# Patient Record
Sex: Female | Born: 1970 | Hispanic: No | Smoking: Former smoker
Health system: Southern US, Community
[De-identification: ages and names within clinical notes are randomized; demographics above are authoritative.]

## PROBLEM LIST (undated history)

## (undated) DIAGNOSIS — D219 Benign neoplasm of connective and other soft tissue, unspecified: Secondary | ICD-10-CM

## (undated) DIAGNOSIS — E669 Obesity, unspecified: Secondary | ICD-10-CM

## (undated) DIAGNOSIS — E119 Type 2 diabetes mellitus without complications: Secondary | ICD-10-CM

## (undated) DIAGNOSIS — E785 Hyperlipidemia, unspecified: Secondary | ICD-10-CM

## (undated) DIAGNOSIS — R12 Heartburn: Secondary | ICD-10-CM

## (undated) DIAGNOSIS — I10 Essential (primary) hypertension: Secondary | ICD-10-CM

## (undated) DIAGNOSIS — E559 Vitamin D deficiency, unspecified: Secondary | ICD-10-CM

## (undated) DIAGNOSIS — M1990 Unspecified osteoarthritis, unspecified site: Secondary | ICD-10-CM

## (undated) DIAGNOSIS — E079 Disorder of thyroid, unspecified: Secondary | ICD-10-CM

## (undated) DIAGNOSIS — G4730 Sleep apnea, unspecified: Secondary | ICD-10-CM

## (undated) DIAGNOSIS — Z87898 Personal history of other specified conditions: Secondary | ICD-10-CM

## (undated) DIAGNOSIS — K219 Gastro-esophageal reflux disease without esophagitis: Secondary | ICD-10-CM

## (undated) DIAGNOSIS — F419 Anxiety disorder, unspecified: Secondary | ICD-10-CM

## (undated) HISTORY — PX: WISDOM TOOTH EXTRACTION: SHX21

## (undated) HISTORY — DX: Disorder of thyroid, unspecified: E07.9

## (undated) HISTORY — DX: Essential (primary) hypertension: I10

## (undated) HISTORY — DX: Heartburn: R12

## (undated) HISTORY — PX: TONSILLECTOMY: SUR1361

## (undated) HISTORY — DX: Gastro-esophageal reflux disease without esophagitis: K21.9

## (undated) HISTORY — DX: Anxiety disorder, unspecified: F41.9

## (undated) HISTORY — DX: Hyperlipidemia, unspecified: E78.5

## (undated) HISTORY — DX: Unspecified osteoarthritis, unspecified site: M19.90

## (undated) HISTORY — DX: Personal history of other specified conditions: Z87.898

## (undated) HISTORY — DX: Vitamin D deficiency, unspecified: E55.9

## (undated) HISTORY — DX: Obesity, unspecified: E66.9

## (undated) HISTORY — DX: Sleep apnea, unspecified: G47.30

## (undated) HISTORY — DX: Type 2 diabetes mellitus without complications: E11.9

---

## 2000-01-29 ENCOUNTER — Emergency Department (HOSPITAL_COMMUNITY): Admission: EM | Admit: 2000-01-29 | Discharge: 2000-01-29 | Payer: Self-pay | Admitting: Emergency Medicine

## 2000-05-15 ENCOUNTER — Other Ambulatory Visit: Admission: RE | Admit: 2000-05-15 | Discharge: 2000-05-15 | Payer: Self-pay | Admitting: Obstetrics and Gynecology

## 2000-06-19 ENCOUNTER — Encounter: Admission: RE | Admit: 2000-06-19 | Discharge: 2000-09-17 | Payer: Self-pay | Admitting: Gynecology

## 2001-05-29 ENCOUNTER — Encounter: Payer: Self-pay | Admitting: Neurosurgery

## 2001-05-29 ENCOUNTER — Ambulatory Visit (HOSPITAL_COMMUNITY): Admission: RE | Admit: 2001-05-29 | Discharge: 2001-05-29 | Payer: Self-pay | Admitting: Neurosurgery

## 2001-06-26 ENCOUNTER — Emergency Department (HOSPITAL_COMMUNITY): Admission: EM | Admit: 2001-06-26 | Discharge: 2001-06-26 | Payer: Self-pay | Admitting: Emergency Medicine

## 2001-07-02 ENCOUNTER — Other Ambulatory Visit: Admission: RE | Admit: 2001-07-02 | Discharge: 2001-07-02 | Payer: Self-pay | Admitting: Obstetrics and Gynecology

## 2002-05-26 ENCOUNTER — Emergency Department (HOSPITAL_COMMUNITY): Admission: EM | Admit: 2002-05-26 | Discharge: 2002-05-26 | Payer: Self-pay | Admitting: Emergency Medicine

## 2004-03-20 ENCOUNTER — Other Ambulatory Visit: Admission: RE | Admit: 2004-03-20 | Discharge: 2004-03-20 | Payer: Self-pay | Admitting: Obstetrics and Gynecology

## 2004-12-31 HISTORY — PX: CHOLECYSTECTOMY: SHX55

## 2005-10-11 ENCOUNTER — Encounter: Admission: RE | Admit: 2005-10-11 | Discharge: 2005-10-11 | Payer: Self-pay | Admitting: Gastroenterology

## 2006-01-07 ENCOUNTER — Encounter (INDEPENDENT_AMBULATORY_CARE_PROVIDER_SITE_OTHER): Payer: Self-pay | Admitting: *Deleted

## 2006-01-07 ENCOUNTER — Ambulatory Visit (HOSPITAL_COMMUNITY): Admission: RE | Admit: 2006-01-07 | Discharge: 2006-01-08 | Payer: Self-pay | Admitting: General Surgery

## 2007-02-05 ENCOUNTER — Emergency Department (HOSPITAL_COMMUNITY): Admission: EM | Admit: 2007-02-05 | Discharge: 2007-02-05 | Payer: Self-pay | Admitting: Emergency Medicine

## 2009-03-08 ENCOUNTER — Encounter: Admission: RE | Admit: 2009-03-08 | Discharge: 2009-03-08 | Payer: Self-pay | Admitting: Obstetrics and Gynecology

## 2009-03-11 ENCOUNTER — Encounter: Admission: RE | Admit: 2009-03-11 | Discharge: 2009-03-11 | Payer: Self-pay | Admitting: Obstetrics and Gynecology

## 2009-04-19 ENCOUNTER — Ambulatory Visit (HOSPITAL_COMMUNITY): Admission: RE | Admit: 2009-04-19 | Discharge: 2009-04-20 | Payer: Self-pay | Admitting: Interventional Radiology

## 2009-05-10 ENCOUNTER — Encounter: Admission: RE | Admit: 2009-05-10 | Discharge: 2009-05-10 | Payer: Self-pay | Admitting: Interventional Radiology

## 2009-12-06 ENCOUNTER — Encounter: Admission: RE | Admit: 2009-12-06 | Discharge: 2009-12-06 | Payer: Self-pay | Admitting: Interventional Radiology

## 2009-12-31 HISTORY — PX: ROBOT ASSISTED MYOMECTOMY: SHX5142

## 2010-02-07 ENCOUNTER — Emergency Department (HOSPITAL_COMMUNITY): Admission: EM | Admit: 2010-02-07 | Discharge: 2010-02-07 | Payer: Self-pay | Admitting: Emergency Medicine

## 2011-01-21 ENCOUNTER — Encounter: Payer: Self-pay | Admitting: Obstetrics and Gynecology

## 2011-03-23 LAB — DIFFERENTIAL
Lymphocytes Relative: 9 % — ABNORMAL LOW (ref 12–46)
Lymphs Abs: 0.8 10*3/uL (ref 0.7–4.0)
Monocytes Absolute: 1 10*3/uL (ref 0.1–1.0)
Monocytes Relative: 11 % (ref 3–12)

## 2011-03-23 LAB — POCT CARDIAC MARKERS
CKMB, poc: <1 ng/mL — ABNORMAL LOW (ref 1.0–8.0)
Myoglobin, poc: 61.2 ng/mL (ref 12–200)
Myoglobin, poc: 63.8 ng/mL (ref 12–200)
Troponin i, poc: <0.05 ng/mL (ref 0.00–0.09)

## 2011-03-23 LAB — CBC
HCT: 37.1 % (ref 36.0–46.0)
MCHC: 34.5 g/dL (ref 30.0–36.0)
MCV: 92.1 fL (ref 78.0–100.0)

## 2011-04-11 LAB — HCG, SERUM, QUALITATIVE: Preg, Serum: NEGATIVE

## 2011-04-11 LAB — CREATININE, SERUM
Creatinine, Ser: 0.81 mg/dL (ref 0.4–1.2)
GFR calc Af Amer: >60 mL/min (ref >60)
GFR calc non Af Amer: >60 mL/min (ref >60)

## 2011-04-11 LAB — CBC: RDW: 12.1 % (ref 11.5–15.5)

## 2011-05-18 NOTE — Op Note (Signed)
Amanda Mcintosh, Amanda Mcintosh           ACCOUNT NO.:  [id number]   MEDICAL RECORD NO.:  [id number]          PATIENT TYPE:  AMB   LOCATION:  SDS                          FACILITY:  [facility]   PHYSICIAN:  Cherylynn Ridges, M.D.    DATE OF BIRTH:  10-05-1971   DATE OF PROCEDURE:  01/07/2006  DATE OF DISCHARGE:                                 OPERATIVE REPORT   PREOP DIAGNOSIS:  Symptomatic cholelithiasis.   POSTOP DIAGNOSIS:  Symptomatic cholelithiasis.   PROCEDURE:  Laparoscopic cholecystectomy.   SURGEON:  Dr. Lindie Spruce.   ASSISTANT:  Gita Kudo, M.D.   ANESTHESIA:  General endotracheal.   ESTIMATED BLOOD LOSS:  Less than 20 mL.   COMPLICATIONS:  None.   CONDITION:  Stable.   INDICATIONS FOR OPERATION:  The patient is a 40 year old female with  symptomatic gallstones who now comes in for elective laparoscopic  cholecystectomy.   FINDINGS:  The patient in addition to having some mild pericholecystic edema  had some adhesions of the liver capsule to the anterior abdominal wall  reminiscent of Fitz-Hugh-Curtis type disease. Cholangiogram was normal.   OPERATION:  The patient was taken to the operating room, placed on table in  supine position. After an adequate endotracheal anesthetic was administered,  she was prepped and draped in usual sterile manner exposing the midline in  the right upper quadrant.   A supraumbilical curvilinear incision was made and taken down to the midline  fascia. The fascia was grasped using Kocher clamps and subsequent incision  made between clamps using #15 blade. We then bluntly dissected down through  the fascial opening into the peritoneal cavity using a Kelly clamp. We then  placed a pursestring suture of 0 Vicryl in the opening in the fascia then  passed Hasson cannula into the peritoneal cavity with minimal difficulty.   We were able to demonstrate adequate visualization and placement of the  cannula. Once we did so, we placed two right costal  margin 5-mm cannulas and  a subxiphoid 11/12 mm cannula direct vision. Once all cannulas were in  place, the patient was placed in reverse Trendelenburg steeply.  The left  side was tilted down and the dissection begun.   The dome of the gallbladder was grasped and retract towards the right upper  quadrant and the anterior abdominal wall.  We placed a second one on the  infundibulum. We dissected out the peritoneum overlying the triangle of  Calot and hepatic duodenal triangle carefully dissected out a window around  the cystic duct and then subsequently the cystic artery. Proximal clamp was  placed on the gallbladder side of the cystic duct and the  cholecystodochotomy made using laparoscopic scissors. When this was done we  were able to pass a Cook catheter which had been passed through the thick  abdominal wall into cholecystodochotomy and secured in place with a  Endoclip. Cholangiogram was done which showed good flow into the duodenum,  ductal filling with no filling defects, good proximal flow and no evidence  of obstruction.   Once this was done, the gas was allowed to insufflate back into the  peritoneal cavity.  The clip securing the catheter in place was removed.  The cholangiogram catheter was removed and then the distal cystic duct  clipped doubly and then transected. Once the cystic duct was transected, we  dissected out the cystic artery, clipped it proximally x2 and distally x1  and then dissected out the gallbladder from its bed with minimal difficulty  and minimal bleeding.   The gallbladder was removed without EndoCatch bag. And then the fascial site  at the supraumbilical site closed using a pursestring suture. We then  injected 0.25%  Marcaine at all sites and then closed the skin using running  subcuticular stitch of 4-0 Vicryl. All needle counts, sponge counts and  instrument counts were correct. Sterile dressings were applied.      Cherylynn Ridges, M.D.   Electronically Signed     JOW/MEDQ  D:  01/07/2006  T:  01/07/2006  Job:  [id number]

## 2012-08-31 HISTORY — PX: OTHER SURGICAL HISTORY: SHX169

## 2012-09-08 ENCOUNTER — Observation Stay (HOSPITAL_BASED_OUTPATIENT_CLINIC_OR_DEPARTMENT_OTHER)
Admission: EM | Admit: 2012-09-08 | Discharge: 2012-09-09 | Disposition: A | Discharge status: Home / Self Care | Payer: BC Managed Care – PPO | Attending: Emergency Medicine | Admitting: Emergency Medicine

## 2012-09-08 ENCOUNTER — Emergency Department (HOSPITAL_BASED_OUTPATIENT_CLINIC_OR_DEPARTMENT_OTHER): Payer: BC Managed Care – PPO

## 2012-09-08 ENCOUNTER — Encounter (HOSPITAL_BASED_OUTPATIENT_CLINIC_OR_DEPARTMENT_OTHER): Payer: Self-pay

## 2012-09-08 DIAGNOSIS — R079 Chest pain, unspecified: Principal | ICD-10-CM | POA: Insufficient documentation

## 2012-09-08 DIAGNOSIS — R0989 Other specified symptoms and signs involving the circulatory and respiratory systems: Secondary | ICD-10-CM | POA: Insufficient documentation

## 2012-09-08 DIAGNOSIS — M79609 Pain in unspecified limb: Secondary | ICD-10-CM | POA: Insufficient documentation

## 2012-09-08 DIAGNOSIS — R0609 Other forms of dyspnea: Secondary | ICD-10-CM | POA: Insufficient documentation

## 2012-09-08 DIAGNOSIS — F17200 Nicotine dependence, unspecified, uncomplicated: Secondary | ICD-10-CM | POA: Insufficient documentation

## 2012-09-08 DIAGNOSIS — R0789 Other chest pain: Secondary | ICD-10-CM

## 2012-09-08 DIAGNOSIS — Z8249 Family history of ischemic heart disease and other diseases of the circulatory system: Secondary | ICD-10-CM | POA: Insufficient documentation

## 2012-09-08 DIAGNOSIS — M25519 Pain in unspecified shoulder: Secondary | ICD-10-CM | POA: Insufficient documentation

## 2012-09-08 HISTORY — DX: Benign neoplasm of connective and other soft tissue, unspecified: D21.9

## 2012-09-08 LAB — CBC WITH DIFFERENTIAL/PLATELET
Basophils Absolute: 0 10*3/uL (ref 0.0–0.1)
Eosinophils Absolute: 0.1 10*3/uL (ref 0.0–0.7)
Lymphocytes Relative: 45 % (ref 12–46)
Lymphs Abs: 4.2 10*3/uL — ABNORMAL HIGH (ref 0.7–4.0)
Monocytes Absolute: 0.8 10*3/uL (ref 0.1–1.0)
Monocytes Relative: 9 % (ref 3–12)
Neutro Abs: 4.2 10*3/uL (ref 1.7–7.7)
RBC: 4.19 MIL/uL (ref 3.87–5.11)
WBC: 9.3 10*3/uL (ref 4.0–10.5)

## 2012-09-08 LAB — COMPREHENSIVE METABOLIC PANEL WITH GFR
ALT: 12 U/L (ref 0–35)
AST: 10 U/L (ref 0–37)
Albumin: 3.2 g/dL — ABNORMAL LOW (ref 3.5–5.2)
Alkaline Phosphatase: 69 U/L (ref 39–117)
BUN: 9 mg/dL (ref 6–23)
CO2: 21 meq/L (ref 19–32)
Calcium: 8.7 mg/dL (ref 8.4–10.5)
Chloride: 107 meq/L (ref 96–112)
Creatinine, Ser: 0.7 mg/dL (ref 0.50–1.10)
GFR calc Af Amer: >90 mL/min
GFR calc non Af Amer: >90 mL/min
Glucose, Bld: 87 mg/dL (ref 70–99)
Potassium: 4.1 meq/L (ref 3.5–5.1)
Sodium: 138 meq/L (ref 135–145)
Total Bilirubin: 0.3 mg/dL (ref 0.3–1.2)
Total Protein: 6.6 g/dL (ref 6.0–8.3)

## 2012-09-08 LAB — POCT I-STAT TROPONIN I: Troponin i, poc: 0 ng/mL (ref 0.00–0.08)

## 2012-09-08 LAB — TROPONIN I: Troponin I: <0.3 ng/mL

## 2012-09-08 LAB — D-DIMER, QUANTITATIVE: D-Dimer, Quant: <0.22 ug{FEU}/mL (ref 0.00–0.48)

## 2012-09-08 MED ORDER — ACETAMINOPHEN 325 MG PO TABS
650.0000 mg | ORAL_TABLET | Freq: Once | ORAL | Status: AC
  Administered 2012-09-08: 650 mg via ORAL
  Filled 2012-09-08: qty 2

## 2012-09-08 MED ORDER — NITROGLYCERIN 0.4 MG SL SUBL
0.4000 mg | SUBLINGUAL_TABLET | SUBLINGUAL | Status: DC | PRN
Start: 2012-09-08 — End: 2012-09-09
  Administered 2012-09-08: 0.4 mg via SUBLINGUAL
  Filled 2012-09-08 (×2): qty 25

## 2012-09-08 MED ORDER — ASPIRIN 81 MG PO CHEW
324.0000 mg | CHEWABLE_TABLET | Freq: Once | ORAL | Status: AC
  Administered 2012-09-08: 324 mg via ORAL
  Filled 2012-09-08: qty 4

## 2012-09-08 NOTE — ED Notes (Signed)
[facility] called to transfer [facility] to [facility]

## 2012-09-08 NOTE — ED Provider Notes (Signed)
History     [name]: [id number]  Arrival date & time 09/08/12  1215   First MD Initiated Contact with Patient 09/08/12 1300      Chief Complaint  Patient presents with  . Chest Pain    (Consider location/radiation/quality/duration/timing/severity/associated sxs/prior treatment) Patient is a 41 y.o. female presenting with chest pain.  Chest Pain Pertinent negatives for primary symptoms include no fever, no shortness of breath, no cough, no abdominal pain and no vomiting.  Pertinent negatives for associated symptoms include no weakness.   Patient awoke from sleep at 0100 with palpitation felt like heart beating fast, sweaty, and some sob.  Drank water, breathed slowly, and symptoms resolved about 0230.  Chest hurt with each palpitation and felt tight, now those symptoms gone but has anterior chest heaviness with left arm heaviness with movement.   Past Medical History  Diagnosis Date  . Fibroids     Past Surgical History  Procedure Date  . Cholecystectomy 2006  . Wisdom tooth extraction     Family History  Problem Relation Age of Onset  . Diabetes Mother   . Hypertension Mother   . Heart failure Mother   . Stroke Mother   . Cancer Sister     History  Substance Use Topics  . Smoking status: Current Everyday Smoker -- 0.3 packs/day    Types: Cigarettes  . Smokeless tobacco: Not on file  . Alcohol Use: No    OB History    Grav Para Term Preterm Abortions TAB SAB Ect Mult Living                  Review of Systems  Constitutional: Negative for fever, chills, activity change, appetite change and unexpected weight change.  HENT: Negative for sore throat, rhinorrhea, neck pain, neck stiffness and sinus pressure.   Eyes: Negative for visual disturbance.  Respiratory: Negative for cough and shortness of breath.   Cardiovascular: Positive for chest pain. Negative for leg swelling.  Gastrointestinal: Negative for vomiting, abdominal pain, diarrhea and blood in stool.    Genitourinary: Negative for dysuria, urgency, frequency, vaginal discharge and difficulty urinating.  Musculoskeletal: Negative for myalgias, arthralgias and gait problem.  Skin: Negative for color change and rash.  Neurological: Negative for weakness, light-headedness and headaches.  Hematological: Does not bruise/bleed easily.  Psychiatric/Behavioral: Negative for dysphoric mood.    Allergies  Review of patient's allergies indicates not on file.  Home Medications  No current outpatient prescriptions on file.  BP 127/73  Pulse 59  Temp 98.3 F (36.8 C) (Oral)  Resp 15  Ht 5\' 5"  (1.651 m)  Wt 231 lb (104.781 kg)  BMI 38.44 kg/m2  SpO2 100%  LMP 08/31/2012  Physical Exam  Nursing note and vitals reviewed. Constitutional: She appears well-developed and well-nourished.  HENT:  Head: Normocephalic and atraumatic.  Eyes: Conjunctivae and EOM are normal. Pupils are equal, round, and reactive to light.  Neck: Normal range of motion. Neck supple.  Cardiovascular: Normal rate, regular rhythm, normal heart sounds and intact distal pulses.   Pulmonary/Chest: Effort normal and breath sounds normal.       Moderate anterior chest wall tenderness  Abdominal: Soft. Bowel sounds are normal.  Musculoskeletal: Normal range of motion.       Some left shoulder tenderness with abduction and palption  Neurological: She is alert.  Skin: Skin is warm and dry.  Psychiatric: She has a normal mood and affect. Thought content normal.    ED Course  Procedures (including critical  care time)   Labs Reviewed  COMPREHENSIVE METABOLIC PANEL  TROPONIN I  CBC WITH DIFFERENTIAL   No results found.   No diagnosis found.    MDM  palpitations        Hilario Quarry, MD 09/10/12 1702

## 2012-09-08 NOTE — ED Notes (Signed)
Pt reports sudden onset of chest pain that started this morning at 1 am.  Pt reports having left-sided heaviness in her arms and some tingling in her digits but has since gone away. Pt rates pain 3/10.

## 2012-09-08 NOTE — ED Provider Notes (Signed)
History     [name]: [id number]  Arrival date & time 09/08/12  1215   First MD Initiated Contact with Patient 09/08/12 1300      Chief Complaint  Patient presents with  . Chest Pain    (Consider location/radiation/quality/duration/timing/severity/associated sxs/prior treatment) Patient is a 41 y.o. female presenting with chest pain. The history is provided by the patient. No language interpreter was used.  Chest Pain The chest pain began yesterday. Chest pain occurs constantly. The chest pain is unchanged. The pain is associated with breathing. At its most intense, the pain is at 4/10. The pain is currently at 4/10. The severity of the pain is mild. The quality of the pain is described as aching. The pain radiates to the left shoulder and left arm. Pertinent negatives for primary symptoms include no fever and no cough. She tried aspirin for the symptoms. Risk factors include smoking/tobacco exposure.  Her family medical history is significant for heart disease in family and hypertension in family.     Past Medical History  Diagnosis Date  . Fibroids     Past Surgical History  Procedure Date  . Cholecystectomy 2006  . Wisdom tooth extraction     Family History  Problem Relation Age of Onset  . Diabetes Mother   . Hypertension Mother   . Heart failure Mother   . Stroke Mother   . Cancer Sister     History  Substance Use Topics  . Smoking status: Current Everyday Smoker -- 0.3 packs/day    Types: Cigarettes  . Smokeless tobacco: Not on file  . Alcohol Use: No    OB History    Grav Para Term Preterm Abortions TAB SAB Ect Mult Living                  Review of Systems  Constitutional: Negative for fever.  Respiratory: Negative for cough.   Cardiovascular: Positive for chest pain.  All other systems reviewed and are negative.    Allergies  Review of patient's allergies indicates no known allergies.  Home Medications  No current outpatient prescriptions on  file.  BP 114/74  Pulse 57  Temp 98.3 F (36.8 C) (Oral)  Resp 20  Ht 5\' 5"  (1.651 m)  Wt 231 lb (104.781 kg)  BMI 38.44 kg/m2  SpO2 98%  LMP 08/31/2012  Physical Exam  Nursing note and vitals reviewed. Constitutional: She is oriented to person, place, and time. She appears well-developed and well-nourished.  HENT:  Head: Normocephalic and atraumatic.  Eyes: Conjunctivae and EOM are normal. Pupils are equal, round, and reactive to light.  Neck: Normal range of motion. Neck supple.  Cardiovascular: Normal rate and normal heart sounds.   Pulmonary/Chest: Effort normal and breath sounds normal.  Abdominal: Soft. Bowel sounds are normal.  Musculoskeletal: Normal range of motion.  Neurological: She is alert and oriented to person, place, and time.  Skin: Skin is warm and dry.  Psychiatric: She has a normal mood and affect.    ED Course  Procedures (including critical care time)  Labs Reviewed  COMPREHENSIVE METABOLIC PANEL - Abnormal; Notable for the following:    Albumin 3.2 (*)     All other components within normal limits  CBC WITH DIFFERENTIAL - Abnormal; Notable for the following:    Lymphs Abs 4.2 (*)     All other components within normal limits  TROPONIN I  D-DIMER, QUANTITATIVE   Dg Chest 2 View  09/08/2012  *RADIOLOGY REPORT*  Clinical Data: Chest  pain  CHEST - 2 VIEW  Comparison: February 07, 2010  Findings: Lungs are clear.  Heart size and pulmonary vascularity are normal.  No adenopathy.  No bone lesions.  IMPRESSION: Lungs clear.   Original Report Authenticated By: Arvin Collard. WOODRUFF III, M.D.    Date: 09/08/2012  Rate: 58  Rhythm: sinus bradycardia  QRS Axis: normal  Intervals: normal  ST/T Wave abnormalities: normal  Conduction Disutrbances:none  Narrative Interpretation:   Old EKG Reviewed: none available    1. Chest pain       MDM  I spoke to Dr. Deretha Emory and Laveda Norman PA.  Who will placed pt in CdU.  For evaluation        Elson Areas,  PA 09/08/12 1736  Lonia Skinner [place], [place] 09/08/12 1737

## 2012-09-08 NOTE — ED Provider Notes (Signed)
Patient was transferred from [facility] to CDU under chest pain protocol.  Patient presents with left-sided chest pain which she described as a pressure sensation radiates to the left shoulder and arm which has been waxing and waning since last night. She also endorsed some shortness of breath.  Has risk factor include family hx of cardiac disease, is a smoker, and is over weight.  Pt was cp free prior to transfer however endorse chest pressure and tightness while being transfer, rate as 3/10.    Her D-dimer is negative.  Normal troponin, cxr, and basic labs.  ECG unremarkable.    On exam pt is morbidly obese, has reproducible L side chest pain on palpation, no overlying skin changes, Heart RRR, no M/R/G, Lung CTAB, abd soft non tender, BLE without palpable cords, erythema, homans sign neg.  Palpable pulse to extremities x4.    Repeat EKG is negative.  VSS.  Will give SL nitro  Discussed with Dr. Deretha Emory  8:56 PM Pt report cp has improved, but c/o headache after SL nitro.  Tylenol given.    9:37 PM Pt report cp has resolved.  Repeat ECG is normal, troponin is negative.  Since pain is reproducible, doubt cardiac.  Will continue with CP protocol.    12:01 AM Report given to Dr. Dierdre Highman at end of shift.   Fayrene Helper, PA-C 09/09/12 0001

## 2012-09-08 NOTE — ED Notes (Signed)
Patient arrived vis [facility] from [facility].  Onset chest pain one day ago continued today at work.  Went to urgent care then [facility] Chest pain substernal radiating to left shoulder and left arm.  Pain intermittent from 3/10 achy to 0/10, SOB, and diaphoretic.  Ax4 IV Right AC 20g.

## 2012-09-09 DIAGNOSIS — R072 Precordial pain: Secondary | ICD-10-CM

## 2012-09-09 LAB — POCT I-STAT TROPONIN I: Troponin i, poc: 0 ng/mL (ref 0.00–0.08)

## 2012-09-09 NOTE — ED Provider Notes (Signed)
Medical screening examination/treatment/procedure(s) were performed by non-physician practitioner and as supervising physician I was immediately available for consultation/collaboration.  Derwood Kaplan, MD 09/09/12 [phone]

## 2012-09-09 NOTE — ED Provider Notes (Signed)
0700 report received from Dr. Dierdre Highman of on my shift. Patient is from [facility] coming here with midsternal chest pain that is reproducible a below the left breast. D-dimer was negative EKG unremarkable troponins negative x2. Patient will be going for cardiac CT this a.m.  1130  Report negative for 2D echo. 1215pm  Patient will be discharged and follow up with pcp of choice from list.  Trop - x 3.  No EKG changes.  2 d echo negative for ischemia.  Chest pain resolved.     Labs Reviewed  COMPREHENSIVE METABOLIC PANEL - Abnormal; Notable for the following:    Albumin 3.2 (*)     All other components within normal limits  CBC WITH DIFFERENTIAL - Abnormal; Notable for the following:    Lymphs Abs 4.2 (*)     All other components within normal limits  TROPONIN I  D-DIMER, QUANTITATIVE  POCT I-STAT TROPONIN I  POCT I-STAT TROPONIN I  POCT I-STAT TROPONIN I    Remi Haggard, NP 09/09/12 1234

## 2012-09-09 NOTE — Progress Notes (Signed)
  Echocardiogram Echocardiogram Stress Test has been performed.  Amanda Mcintosh 09/09/2012, 10:44 AM

## 2012-09-09 NOTE — Progress Notes (Signed)
Observation review is complete. 

## 2012-09-09 NOTE — ED Provider Notes (Addendum)
History/physical exam/procedure(s) were performed by non-physician practitioner and as supervising physician I was immediately available for consultation/collaboration. I have reviewed all notes and am in agreement with care and plan.   Hilario Quarry, MD 09/09/12 1757  Hilario Quarry, MD 09/10/12 [phone]

## 2012-09-11 NOTE — ED Provider Notes (Signed)
Medical screening examination/treatment/procedure(s) were conducted as a shared visit with non-physician practitioner(s) and myself.  I personally evaluated the patient during the encounter  Patient from [facility] for Chest Pain Obs Protocol, but patient not really pain free, will give NTG and or morpine if pain not resolved and stays resolved will have to be formally admitted. If pain resolves can consider continuing on protocol or resuming protocol.     Shelda Jakes, MD 09/11/12 [phone]

## 2013-02-10 ENCOUNTER — Other Ambulatory Visit: Payer: Self-pay | Admitting: Otolaryngology

## 2013-02-10 DIAGNOSIS — R42 Dizziness and giddiness: Secondary | ICD-10-CM

## 2013-02-18 ENCOUNTER — Ambulatory Visit
Admission: RE | Admit: 2013-02-18 | Discharge: 2013-02-18 | Disposition: A | Discharge status: Home / Self Care | Payer: BC Managed Care – PPO | Source: Ambulatory Visit | Attending: Otolaryngology | Admitting: Otolaryngology

## 2013-02-18 DIAGNOSIS — R42 Dizziness and giddiness: Secondary | ICD-10-CM

## 2013-02-18 MED ORDER — GADOBENATE DIMEGLUMINE 529 MG/ML IV SOLN
20.0000 mL | Freq: Once | INTRAVENOUS | Status: AC | PRN
  Administered 2013-02-18: 20 mL via INTRAVENOUS

## 2013-03-11 DIAGNOSIS — G43019 Migraine without aura, intractable, without status migrainosus: Secondary | ICD-10-CM | POA: Insufficient documentation

## 2013-03-11 DIAGNOSIS — G43009 Migraine without aura, not intractable, without status migrainosus: Secondary | ICD-10-CM | POA: Insufficient documentation

## 2013-07-27 ENCOUNTER — Encounter: Payer: Self-pay | Admitting: Neurology

## 2013-07-27 DIAGNOSIS — R42 Dizziness and giddiness: Secondary | ICD-10-CM

## 2013-07-27 DIAGNOSIS — G43019 Migraine without aura, intractable, without status migrainosus: Secondary | ICD-10-CM

## 2013-07-28 ENCOUNTER — Ambulatory Visit: Payer: Self-pay | Admitting: Neurology

## 2013-07-31 LAB — HM PAP SMEAR

## 2013-08-17 ENCOUNTER — Ambulatory Visit: Payer: BC Managed Care – PPO | Admitting: Internal Medicine

## 2013-08-17 DIAGNOSIS — Z0289 Encounter for other administrative examinations: Secondary | ICD-10-CM

## 2013-11-11 LAB — HM MAMMOGRAPHY

## 2013-12-02 ENCOUNTER — Encounter: Payer: Self-pay | Admitting: *Deleted

## 2013-12-02 ENCOUNTER — Ambulatory Visit (INDEPENDENT_AMBULATORY_CARE_PROVIDER_SITE_OTHER): Payer: BC Managed Care – PPO | Admitting: Internal Medicine

## 2013-12-02 ENCOUNTER — Encounter: Payer: Self-pay | Admitting: Internal Medicine

## 2013-12-02 ENCOUNTER — Other Ambulatory Visit (INDEPENDENT_AMBULATORY_CARE_PROVIDER_SITE_OTHER): Payer: BC Managed Care – PPO

## 2013-12-02 VITALS — BP 120/72 | HR 84 | Temp 98.0°F | Ht 65.0 in | Wt 238.0 lb

## 2013-12-02 DIAGNOSIS — M62838 Other muscle spasm: Secondary | ICD-10-CM

## 2013-12-02 DIAGNOSIS — E559 Vitamin D deficiency, unspecified: Secondary | ICD-10-CM | POA: Insufficient documentation

## 2013-12-02 DIAGNOSIS — Z6838 Body mass index (BMI) 38.0-38.9, adult: Secondary | ICD-10-CM | POA: Insufficient documentation

## 2013-12-02 DIAGNOSIS — E669 Obesity, unspecified: Secondary | ICD-10-CM

## 2013-12-02 DIAGNOSIS — Z0000 Encounter for general adult medical examination without abnormal findings: Secondary | ICD-10-CM

## 2013-12-02 DIAGNOSIS — D509 Iron deficiency anemia, unspecified: Secondary | ICD-10-CM | POA: Insufficient documentation

## 2013-12-02 DIAGNOSIS — F17200 Nicotine dependence, unspecified, uncomplicated: Secondary | ICD-10-CM | POA: Insufficient documentation

## 2013-12-02 LAB — BASIC METABOLIC PANEL
Calcium: 8.5 mg/dL (ref 8.4–10.5)
Chloride: 107 mEq/L (ref 96–112)
Creatinine, Ser: 0.8 mg/dL (ref 0.4–1.2)
GFR: 101.43 mL/min (ref >60.00)
Glucose, Bld: 158 mg/dL — ABNORMAL HIGH (ref 70–99)
Potassium: 3.8 mEq/L (ref 3.5–5.1)

## 2013-12-02 LAB — URINALYSIS, ROUTINE W REFLEX MICROSCOPIC
Hgb urine dipstick: NEGATIVE
Nitrite: NEGATIVE
Specific Gravity, Urine: >=1.03 (ref 1.000–1.030)
Total Protein, Urine: NEGATIVE
Urine Glucose: NEGATIVE
pH: 5.5 (ref 5.0–8.0)

## 2013-12-02 LAB — CBC WITH DIFFERENTIAL/PLATELET
Basophils Relative: 0.4 % (ref 0.0–3.0)
Eosinophils Absolute: 0.1 10*3/uL (ref 0.0–0.7)
Eosinophils Relative: 0.8 % (ref 0.0–5.0)
Lymphocytes Relative: 18.8 % (ref 12.0–46.0)
MCV: 89.6 fl (ref 78.0–100.0)
Monocytes Absolute: 0.3 10*3/uL (ref 0.1–1.0)
Monocytes Relative: 2.5 % — ABNORMAL LOW (ref 3.0–12.0)
Neutrophils Relative %: 77.5 % — ABNORMAL HIGH (ref 43.0–77.0)
RBC: 4.32 Mil/uL (ref 3.87–5.11)
RDW: 12.6 % (ref 11.5–14.6)
WBC: 13.9 10*3/uL — ABNORMAL HIGH (ref 4.5–10.5)

## 2013-12-02 LAB — HEPATIC FUNCTION PANEL
ALT: 18 U/L (ref 0–35)
Albumin: 3.3 g/dL — ABNORMAL LOW (ref 3.5–5.2)
Alkaline Phosphatase: 59 U/L (ref 39–117)
Total Protein: 6.4 g/dL (ref 6.0–8.3)

## 2013-12-02 LAB — LIPID PANEL
Cholesterol: 189 mg/dL (ref 0–200)
LDL Cholesterol: 121 mg/dL — ABNORMAL HIGH (ref 0–99)
Triglycerides: 116 mg/dL (ref 0.0–149.0)
VLDL: 23.2 mg/dL (ref 0.0–40.0)

## 2013-12-02 LAB — TSH: TSH: 0.87 u[IU]/mL (ref 0.35–5.50)

## 2013-12-02 MED ORDER — VITAMIN D 50 MCG (2000 UT) PO TABS: 2000.0000 [IU] | ORAL_TABLET | Freq: Every day | ORAL | Status: DC

## 2013-12-02 MED ORDER — FERROUS SULFATE 325 (65 FE) MG PO TABS: 325.0000 mg | ORAL_TABLET | Freq: Every day | ORAL | Status: DC

## 2013-12-02 NOTE — Assessment & Plan Note (Signed)
5 minutes today spent counseling patient on unhealthy effects of continued tobacco abuse and encouragement of cessation including medical options available to help the patient quit smoking. 

## 2013-12-02 NOTE — Patient Instructions (Addendum)
It was good to see you today.  We have reviewed your prior records including labs and tests today  we will send to your prior provider(s) for "release of records" as discussed today.  Health Maintenance reviewed - all recommended immunizations and age-appropriate screenings are up-to-date.  Medications reviewed and updated Take Vitamin D 2000 units and one iron tablet daily - no other changes recommended at this time.  Test(s) ordered today. Your results will be released to MyChart (or called to you) after review, usually within 72hours after test completion. If any changes need to be made, you will be notified at that same time.  Work on lifestyle changes as discussed (low fat, low carb, increased protein diet; improved exercise efforts; weight loss) to control sugar, blood pressure and cholesterol levels and/or reduce risk of developing other medical problems. Look into [email or web address] or other type of food journal to assist you in this process.  Continue to think about giving up cigarettes! Use nicotine gum, nicotine patches or electronic cigarettes. If you're interested in medication to help you quit, please call  - let me know how I can help!  Please schedule followup in 12 months for annual exam, call sooner if problems.  Health Maintenance, Female A healthy lifestyle and preventative care can promote health and wellness.  Maintain regular health, dental, and eye exams.  Eat a healthy diet. Foods like vegetables, fruits, whole grains, low-fat dairy products, and lean protein foods contain the nutrients you need without too many calories. Decrease your intake of foods high in solid fats, added sugars, and salt. Get information about a proper diet from your caregiver, if necessary.  Regular physical exercise is one of the most important things you can do for your health. Most adults should get at least 150 minutes of moderate-intensity exercise (any activity that increases your heart  rate and causes you to sweat) each week. In addition, most adults need muscle-strengthening exercises on 2 or more days a week.   Maintain a healthy weight. The body mass index (BMI) is a screening tool to identify possible weight problems. It provides an estimate of body fat based on height and weight. Your caregiver can help determine your BMI, and can help you achieve or maintain a healthy weight. For adults 20 years and older:  A BMI below 18.5 is considered underweight.  A BMI of 18.5 to 24.9 is normal.  A BMI of 25 to 29.9 is considered overweight.  A BMI of 30 and above is considered obese.  Maintain normal blood lipids and cholesterol by exercising and minimizing your intake of saturated fat. Eat a balanced diet with plenty of fruits and vegetables. Blood tests for lipids and cholesterol should begin at age 68 and be repeated every 5 years. If your lipid or cholesterol levels are high, you are over 50, or you are a high risk for heart disease, you may need your cholesterol levels checked more frequently.Ongoing high lipid and cholesterol levels should be treated with medicines if diet and exercise are not effective.  If you smoke, find out from your caregiver how to quit. If you do not use tobacco, do not start.  Lung cancer screening is recommended for adults aged 18 80 years who are at high risk for developing lung cancer because of a history of smoking. Yearly low-dose computed tomography (CT) is recommended for people who have at least a 30-pack-year history of smoking and are a current smoker or have quit within the past 15 years.  A pack year of smoking is smoking an average of 1 pack of cigarettes a day for 1 year (for example: 1 pack a day for 30 years or 2 packs a day for 15 years). Yearly screening should continue until the smoker has stopped smoking for at least 15 years. Yearly screening should also be stopped for people who develop a health problem that would prevent them from  having lung cancer treatment.  If you are pregnant, do not drink alcohol. If you are breastfeeding, be very cautious about drinking alcohol. If you are not pregnant and choose to drink alcohol, do not exceed 1 drink per day. One drink is considered to be 12 ounces (355 mL) of beer, 5 ounces (148 mL) of wine, or 1.5 ounces (44 mL) of liquor.  Avoid use of street drugs. Do not share needles with anyone. Ask for help if you need support or instructions about stopping the use of drugs.  High blood pressure causes heart disease and increases the risk of stroke. Blood pressure should be checked at least every 1 to 2 years. Ongoing high blood pressure should be treated with medicines, if weight loss and exercise are not effective.  If you are 78 to 44 years old, ask your caregiver if you should take aspirin to prevent strokes.  Diabetes screening involves taking a blood sample to check your fasting blood sugar level. This should be done once every 3 years, after age 46, if you are within normal weight and without risk factors for diabetes. Testing should be considered at a younger age or be carried out more frequently if you are overweight and have at least 1 risk factor for diabetes.  Breast cancer screening is essential preventative care for women. You should practice "breast self-awareness." This means understanding the normal appearance and feel of your breasts and may include breast self-examination. Any changes detected, no matter how small, should be reported to a caregiver. Women in their 19s and 30s should have a clinical breast exam (CBE) by a caregiver as part of a regular health exam every 1 to 3 years. After age 72, women should have a CBE every year. Starting at age 61, women should consider having a mammogram (breast X-ray) every year. Women who have a family history of breast cancer should talk to their caregiver about genetic screening. Women at a high risk of breast cancer should talk to their  caregiver about having an MRI and a mammogram every year.  Breast cancer gene (BRCA)-related cancer risk assessment is recommended for women who have family members with BRCA-related cancers. BRCA-related cancers include breast, ovarian, tubal, and peritoneal cancers. Having family members with these cancers may be associated with an increased risk for harmful changes (mutations) in the breast cancer genes BRCA1 and BRCA2. Results of the assessment will determine the need for genetic counseling and BRCA1 and BRCA2 testing.  The Pap test is a screening test for cervical cancer. Women should have a Pap test starting at age 60. Between ages 38 and 66, Pap tests should be repeated every 2 years. Beginning at age 50, you should have a Pap test every 3 years as long as the past 3 Pap tests have been normal. If you had a hysterectomy for a problem that was not cancer or a condition that could lead to cancer, then you no longer need Pap tests. If you are between ages 99 and 106, and you have had normal Pap tests going back 10 years, you no  longer need Pap tests. If you have had past treatment for cervical cancer or a condition that could lead to cancer, you need Pap tests and screening for cancer for at least 20 years after your treatment. If Pap tests have been discontinued, risk factors (such as a new sexual partner) need to be reassessed to determine if screening should be resumed. Some women have medical problems that increase the chance of getting cervical cancer. In these cases, your caregiver may recommend more frequent screening and Pap tests.  The human papillomavirus (HPV) test is an additional test that may be used for cervical cancer screening. The HPV test looks for the virus that can cause the cell changes on the cervix. The cells collected during the Pap test can be tested for HPV. The HPV test could be used to screen women aged 67 years and older, and should be used in women of any age who have unclear  Pap test results. After the age of 71, women should have HPV testing at the same frequency as a Pap test.  Colorectal cancer can be detected and often prevented. Most routine colorectal cancer screening begins at the age of 67 and continues through age 45. However, your caregiver may recommend screening at an earlier age if you have risk factors for colon cancer. On a yearly basis, your caregiver may provide home test kits to check for hidden blood in the stool. Use of a small camera at the end of a tube, to directly examine the colon (sigmoidoscopy or colonoscopy), can detect the earliest forms of colorectal cancer. Talk to your caregiver about this at age 28, when routine screening begins. Direct examination of the colon should be repeated every 5 to 10 years through age 51, unless early forms of pre-cancerous polyps or small growths are found.  Hepatitis C blood testing is recommended for all people born from [birth year] through 1967 and any individual with known risks for hepatitis C.  Practice safe sex. Use condoms and avoid high-risk sexual practices to reduce the spread of sexually transmitted infections (STIs). Sexually active women aged 81 and younger should be checked for Chlamydia, which is a common sexually transmitted infection. Older women with new or multiple partners should also be tested for Chlamydia. Testing for other STIs is recommended if you are sexually active and at increased risk.  Osteoporosis is a disease in which the bones lose minerals and strength with aging. This can result in serious bone fractures. The risk of osteoporosis can be identified using a bone density scan. Women ages 56 and over and women at risk for fractures or osteoporosis should discuss screening with their caregivers. Ask your caregiver whether you should be taking a calcium supplement or vitamin D to reduce the rate of osteoporosis.  Menopause can be associated with physical symptoms and risks. Hormone replacement  therapy is available to decrease symptoms and risks. You should talk to your caregiver about whether hormone replacement therapy is right for you.  Use sunscreen. Apply sunscreen liberally and repeatedly throughout the day. You should seek shade when your shadow is shorter than you. Protect yourself by wearing long sleeves, pants, a wide-brimmed hat, and sunglasses year round, whenever you are outdoors.  Notify your caregiver of new moles or changes in moles, especially if there is a change in shape or color. Also notify your caregiver if a mole is larger than the size of a pencil eraser.  Stay current with your immunizations. Document Released: 07/02/2011 Document Revised: 04/13/2013  Document Reviewed: 07/02/2011 [facility] Patient Information 2014 [place], [place]. Muscle Cramps and Spasms Muscle cramps and spasms are when muscles tighten by themselves. They usually get better within minutes. Muscle cramps are painful. They are usually stronger and last longer than muscle spasms. Muscle spasms may or may not be painful. They can last a few seconds or much longer. HOME CARE  Drink enough fluid to keep your pee (urine) clear or pale yellow.  Massage, stretch, and relax the muscle.  Use a warm towel, heating pad, or warm shower water on tight muscles.  Place ice on the muscle if it is tender or in pain.  Put ice in a plastic bag.  Place a towel between your skin and the bag.  Leave the ice on for 15-20 minutes, 03-04 times a day.  Only take medicine as told by your doctor. GET HELP RIGHT AWAY IF:  Your cramps or spasms get worse, happen more often, or do not get better with time. MAKE SURE YOU:  Understand these instructions.  Will watch your condition.  Will get help right away if you are not doing well or get worse. Document Released: 11/29/2008 Document Revised: 04/13/2013 Document Reviewed: 12/03/2012 [facility] Patient Information 2014 [place], [place]. You Can Quit Smoking If  you are ready to quit smoking or are thinking about it, congratulations! You have chosen to help yourself be healthier and live longer! There are lots of different ways to quit smoking. Nicotine gum, nicotine patches, a nicotine inhaler, or nicotine nasal spray can help with physical craving. Hypnosis, support groups, and medicines help break the habit of smoking. TIPS TO GET OFF AND STAY OFF CIGARETTES  Learn to predict your moods. Do not let a bad situation be your excuse to have a cigarette. Some situations in your life might tempt you to have a cigarette.  Ask friends and co-workers not to smoke around you.  Make your home smoke-free.  Never have "just one" cigarette. It leads to wanting another and another. Remind yourself of your decision to quit.  On a card, make a list of your reasons for not smoking. Read it at least the same number of times a day as you have a cigarette. Tell yourself everyday, "I do not want to smoke. I choose not to smoke."  Ask someone at home or work to help you with your plan to quit smoking.  Have something planned after you eat or have a cup of coffee. Take a walk or get other exercise to perk you up. This will help to keep you from overeating.  Try a relaxation exercise to calm you down and decrease your stress. Remember, you may be tense and nervous the first two weeks after you quit. This will pass.  Find new activities to keep your hands busy. Play with a pen, coin, or rubber band. Doodle or draw things on paper.  Brush your teeth right after eating. This will help cut down the craving for the taste of tobacco after meals. You can try mouthwash too.  Try gum, breath mints, or diet candy to keep something in your mouth. IF YOU SMOKE AND WANT TO QUIT:  Do not stock up on cigarettes. Never buy a carton. Wait until one pack is finished before you buy another.  Never carry cigarettes with you at work or at home.  Keep cigarettes as far away from you as  possible. Leave them with someone else.  Never carry matches or a lighter with you.  Ask  yourself, "Do I need this cigarette or is this just a reflex?"  Bet with someone that you can quit. Put cigarette money in a piggy bank every morning. If you smoke, you give up the money. If you do not smoke, by the end of the week, you keep the money.  Keep trying. It takes 21 days to change a habit!  Talk to your doctor about using medicines to help you quit. These include nicotine replacement gum, lozenges, or skin patches. Document Released: 10/13/2009 Document Revised: 03/10/2012 Document Reviewed: 10/13/2009 [facility] Patient Information 2014 [place], [place].

## 2013-12-02 NOTE — Assessment & Plan Note (Signed)
Wt Readings from Last 3 Encounters:  12/02/13 238 lb (107.956 kg)  03/11/13 235 lb (106.595 kg)  09/09/12 234 lb 8 oz (106.369 kg)   The patient is asked to make an attempt to improve diet and exercise patterns to aid in medical management of this problem.

## 2013-12-02 NOTE — Assessment & Plan Note (Signed)
Reports history of same Will send for release of information from prior provider Check labs and encouraged oral replacement Gynecology history reviewed, myomectomy performed 2011 with subsequent improvement in menorrhagia

## 2013-12-02 NOTE — Assessment & Plan Note (Signed)
Diagnosis of same fall 2014 by gynecologist Treated with weekly high-dose replacement x6 weeks, off same now for 2 Recheck level now if normal, encouraged over-the-counter daily use to maintain levels

## 2013-12-02 NOTE — Progress Notes (Signed)
Subjective:    Patient ID: Amanda Mcintosh, female    DOB: 07-02-1971, 42 y.o.   MRN: [id number]  HPI  New patient to me, here to establish with PCP  patient is here today for annual physical. Patient feels well overall  Concerned about leg and foot cramps. Ongoing for over 4 months, intermittent but gradually worsening symptoms. Denies change in medications, exercise. No weakness. Not associated with activity or time of day. Occurs at rest and during activity at day or night  Also reviewed chronic medical conditions   Past Medical History  Diagnosis Date  . Fibroids   . History of vertigo   . History of headache   . Vitamin D deficiency   . Obesity   . Migraine without aura, with intractable migraine, so stated, without mention of status migrainosus 03/11/2013  . Dizziness and giddiness 03/11/2013   Family History  Problem Relation Age of Onset  . Diabetes Mother   . Hypertension Mother   . Heart failure Mother   . Stroke Mother   . Breast cancer Sister   . Skin cancer Sister   . HIV/AIDS Brother    History  Substance Use Topics  . Smoking status: Current Every Day Smoker -- 0.30 packs/day    Types: Cigarettes  . Smokeless tobacco: Not on file  . Alcohol Use: No    Review of Systems  Constitutional: Negative for fatigue and unexpected weight change.  Respiratory: Negative for cough, shortness of breath and wheezing.   Cardiovascular: Negative for chest pain, palpitations and leg swelling.  Gastrointestinal: Negative for nausea, abdominal pain and diarrhea.  Musculoskeletal: Positive for myalgias (leg/foot cramps).  Neurological: Negative for dizziness, weakness, light-headedness and headaches.  Psychiatric/Behavioral: Negative for dysphoric mood. The patient is not nervous/anxious.   All other systems reviewed and are negative.       Objective:   Physical Exam BP 120/72  Pulse 84  Temp(Src) 98 F (36.7 C) (Oral)  Ht 5\' 5"  (1.651 m)  Wt 238 lb  (107.956 kg)  BMI 39.61 kg/m2  SpO2 96% Wt Readings from Last 3 Encounters:  12/02/13 238 lb (107.956 kg)  03/11/13 235 lb (106.595 kg)  09/09/12 234 lb 8 oz (106.369 kg)   Constitutional: She is obese, but appears well-developed and well-nourished. No distress.  HENT: Head: Normocephalic and atraumatic. Ears: B TMs ok, no erythema or effusion; Nose: Nose normal. Mouth/Throat: Oropharynx is clear and moist. No oropharyngeal exudate.  Eyes: Conjunctivae and EOM are normal. Pupils are equal, round, and reactive to light. No scleral icterus.  Neck: Normal range of motion. Neck supple. No JVD present. No thyromegaly present.  Cardiovascular: Normal rate, regular rhythm and normal heart sounds.  No murmur heard. No BLE edema. Pulmonary/Chest: Effort normal and breath sounds normal. No respiratory distress. She has no wheezes.  Abdominal: Soft. Bowel sounds are normal. She exhibits no distension. There is no tenderness. no masses Musculoskeletal: Normal range of motion, no joint effusions. No gross deformities Neurological: She is alert and oriented to person, place, and time. No cranial nerve deficit. Coordination, balance, strength, speech and gait are normal.  Skin: Skin is warm and dry. No rash noted. No erythema.  Psychiatric: She has a normal mood and affect. Her behavior is normal. Judgment and thought content normal.   Lab Results  Component Value Date   WBC 9.3 09/08/2012   HGB 12.5 09/08/2012   HCT 37.4 09/08/2012   PLT 355 09/08/2012   GLUCOSE 87 09/08/2012  ALT 12 09/08/2012   AST 10 09/08/2012   NA 138 09/08/2012   K 4.1 09/08/2012   CL 107 09/08/2012   CREATININE 0.70 09/08/2012   BUN 9 09/08/2012   CO2 21 09/08/2012       Assessment & Plan:   CPX/v70.0 - Patient has been counseled on age-appropriate routine health concerns for screening and prevention. These are reviewed and up-to-date. Immunizations are up-to-date or declined. Labs ordered and reviewed.  Leg and pedal spasms -intermittent  but ongoing greater than 3 months. Check electrolytes, iron and vitamin D -advised supplementation of iron and vitamin D daily given history of deficiency of each. Also okay for spoon of mustard daily as needed -patient agrees to call if symptoms worse or unimproved   Also See problem list. Medications and labs reviewed today.

## 2014-01-28 ENCOUNTER — Other Ambulatory Visit (HOSPITAL_COMMUNITY): Payer: Self-pay | Admitting: Family Medicine

## 2014-01-28 ENCOUNTER — Ambulatory Visit (HOSPITAL_COMMUNITY)
Admission: RE | Admit: 2014-01-28 | Discharge: 2014-01-28 | Disposition: A | Discharge status: Home / Self Care | Payer: BC Managed Care – PPO | Source: Ambulatory Visit | Attending: Family Medicine | Admitting: Family Medicine

## 2014-01-28 DIAGNOSIS — M79609 Pain in unspecified limb: Secondary | ICD-10-CM | POA: Insufficient documentation

## 2014-01-28 DIAGNOSIS — M79604 Pain in right leg: Secondary | ICD-10-CM

## 2014-01-28 DIAGNOSIS — E669 Obesity, unspecified: Secondary | ICD-10-CM | POA: Insufficient documentation

## 2014-01-28 DIAGNOSIS — F17200 Nicotine dependence, unspecified, uncomplicated: Secondary | ICD-10-CM | POA: Insufficient documentation

## 2014-01-28 NOTE — Progress Notes (Signed)
Right lower extremity venous duplex completed.  Right:  No evidence of DVT, superficial thrombosis, or Baker's cyst.  Left:  Negative for DVT in the common femoral vein.  

## 2014-03-11 ENCOUNTER — Ambulatory Visit: Payer: BC Managed Care – PPO | Admitting: Internal Medicine

## 2014-09-14 ENCOUNTER — Ambulatory Visit (HOSPITAL_BASED_OUTPATIENT_CLINIC_OR_DEPARTMENT_OTHER): Payer: BC Managed Care – PPO | Attending: Otolaryngology | Admitting: Radiology

## 2014-09-14 DIAGNOSIS — G4733 Obstructive sleep apnea (adult) (pediatric): Secondary | ICD-10-CM | POA: Insufficient documentation

## 2014-09-14 DIAGNOSIS — J3489 Other specified disorders of nose and nasal sinuses: Secondary | ICD-10-CM | POA: Insufficient documentation

## 2014-09-14 DIAGNOSIS — R5381 Other malaise: Secondary | ICD-10-CM

## 2014-09-14 DIAGNOSIS — R0902 Hypoxemia: Secondary | ICD-10-CM | POA: Diagnosis not present

## 2014-09-14 DIAGNOSIS — R0681 Apnea, not elsewhere classified: Secondary | ICD-10-CM

## 2014-09-14 DIAGNOSIS — R0683 Snoring: Secondary | ICD-10-CM

## 2014-09-14 DIAGNOSIS — R5383 Other fatigue: Secondary | ICD-10-CM

## 2014-09-14 NOTE — Sleep Study (Signed)
The patient is a 43yoAA female who is scheduled to undergo an [place] Test.The patient arrived to the [facility] in no noted distress.The tech demonstrated the application of OCST device.The patient presented clear understanding during the teach back.The patient was instructed to return the device on the next business day.

## 2014-10-02 DIAGNOSIS — R0683 Snoring: Secondary | ICD-10-CM

## 2014-10-02 DIAGNOSIS — R0681 Apnea, not elsewhere classified: Secondary | ICD-10-CM

## 2014-10-02 DIAGNOSIS — R5381 Other malaise: Secondary | ICD-10-CM

## 2014-10-02 NOTE — Sleep Study (Signed)
    NAME: NATOYA VISCOMI DATE OF BIRTH:  [birth year] MEDICAL RECORD NUMBER [id number]  LOCATION: [facility]  PHYSICIAN: YOUNG,CLINTON D  DATE OF STUDY: 09/14/2014  SLEEP STUDY TYPE: Out of Center Sleep Test                REFERRING PHYSICIAN: Jodi Marble, MD  INDICATION FOR STUDY: Hypersomnia with sleep apnea  EPWORTH SLEEPINESS SCORE:   7/24 HEIGHT:   5 feet 5 inches  WEIGHT:   231 pounds  BMI 38  NECK SIZE:   in.  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 55 total events scored including 9 obstructive apneas, 46 hypopneas. Most events were recorded while sleeping prone. Snoring with oxygen desaturation to a nadir of 81% and mean saturation 92% on room air. Mean heart rate 71.4 per minute    RECOMMENDATION:  Scores in this range may respond to conservative measures, especially weight loss and management of nasal congestion. On an individual basis, other alternatives including CPAP or an oral appliance may be appropriate.   Deneise Lever Diplomate, American Board of Sleep Medicine  ELECTRONICALLY SIGNED ON:  10/02/2014, 10:03 AM [place] PH: [phone]   FX: [phone] [place]

## 2015-04-20 ENCOUNTER — Other Ambulatory Visit (INDEPENDENT_AMBULATORY_CARE_PROVIDER_SITE_OTHER): Payer: BLUE CROSS/BLUE SHIELD

## 2015-04-20 ENCOUNTER — Encounter: Payer: Self-pay | Admitting: Internal Medicine

## 2015-04-20 ENCOUNTER — Ambulatory Visit (INDEPENDENT_AMBULATORY_CARE_PROVIDER_SITE_OTHER): Payer: BLUE CROSS/BLUE SHIELD | Admitting: Internal Medicine

## 2015-04-20 VITALS — BP 102/80 | HR 88 | Temp 97.7°F | Resp 16 | Ht 65.0 in | Wt 244.0 lb

## 2015-04-20 DIAGNOSIS — Z0000 Encounter for general adult medical examination without abnormal findings: Secondary | ICD-10-CM

## 2015-04-20 DIAGNOSIS — K219 Gastro-esophageal reflux disease without esophagitis: Secondary | ICD-10-CM

## 2015-04-20 DIAGNOSIS — E669 Obesity, unspecified: Secondary | ICD-10-CM

## 2015-04-20 LAB — LIPID PANEL
CHOL/HDL RATIO: 5
Cholesterol: 224 mg/dL — ABNORMAL HIGH (ref 0–200)
HDL: 42.7 mg/dL (ref >39.00)
LDL CALC: 158 mg/dL — AB (ref 0–99)
NONHDL: 181.3
Triglycerides: 116 mg/dL (ref 0.0–149.0)
VLDL: 23.2 mg/dL (ref 0.0–40.0)

## 2015-04-20 LAB — COMPREHENSIVE METABOLIC PANEL
ALK PHOS: 72 U/L (ref 39–117)
ALT: 21 U/L (ref 0–35)
AST: 15 U/L (ref 0–37)
Albumin: 3.8 g/dL (ref 3.5–5.2)
BUN: 16 mg/dL (ref 6–23)
CHLORIDE: 107 meq/L (ref 96–112)
CO2: 26 meq/L (ref 19–32)
Calcium: 9 mg/dL (ref 8.4–10.5)
Creatinine, Ser: 0.76 mg/dL (ref 0.40–1.20)
GFR: 105.4 mL/min (ref >60.00)
Glucose, Bld: 108 mg/dL — ABNORMAL HIGH (ref 70–99)
POTASSIUM: 4.2 meq/L (ref 3.5–5.1)
Sodium: 137 mEq/L (ref 135–145)
TOTAL PROTEIN: 6.8 g/dL (ref 6.0–8.3)
Total Bilirubin: 0.4 mg/dL (ref 0.2–1.2)

## 2015-04-20 LAB — CBC
HCT: 40.1 % (ref 36.0–46.0)
Hemoglobin: 13.5 g/dL (ref 12.0–15.0)
MCHC: 33.7 g/dL (ref 30.0–36.0)
MCV: 87.2 fl (ref 78.0–100.0)
Platelets: 386 10*3/uL (ref 150.0–400.0)
RBC: 4.6 Mil/uL (ref 3.87–5.11)
RDW: 12.7 % (ref 11.5–15.5)
WBC: 9.6 10*3/uL (ref 4.0–10.5)

## 2015-04-20 LAB — HEMOGLOBIN A1C: HEMOGLOBIN A1C: 5.3 % (ref 4.6–6.5)

## 2015-04-20 LAB — TSH: TSH: 0.92 u[IU]/mL (ref 0.35–4.50)

## 2015-04-20 MED ORDER — PANTOPRAZOLE SODIUM 40 MG PO TBEC
40.0000 mg | DELAYED_RELEASE_TABLET | Freq: Every day | ORAL | Status: DC
Start: 2015-04-20 — End: 2015-07-18

## 2015-04-20 MED ORDER — PHENTERMINE HCL 37.5 MG PO CAPS: 37.5000 mg | ORAL_CAPSULE | ORAL | Status: DC

## 2015-04-20 NOTE — Patient Instructions (Signed)
We will check on the blood work today and call you back with the results. The EKG of the heart looks the same from before no changes.   We have sent in the medicine for the acid reflux called protonix which you take daily before breakfast. The important thing to remember is to tilt the bed or to not eat 3 hours before going to bed or lying down.   We have also given you the prescription for weight loss called phentermine which helps to increase the metabolism and decrease hunger. We need to see you back in about 2 months to make sure you are doing well on it. The most common side effects can be fast heart beat and dry mouth. If you get any side effects please call the office for advice. This medicine is one pill per day.   The good thing to do is work on exercising to help you lose weight. Exercising 6 days per week for 45 minutes per day is the recommended amount of exercise to help you lose weight. You can only be on the medicine for weight loss for 6 months and then you will be relying on the changes you have made to diet and exercise to keep you from gaining back weight.   Food Choices for Gastroesophageal Reflux Disease When you have gastroesophageal reflux disease (GERD), the foods you eat and your eating habits are very important. Choosing the right foods can help ease the discomfort of GERD. WHAT GENERAL GUIDELINES DO I NEED TO FOLLOW?  Choose fruits, vegetables, whole grains, low-fat dairy products, and low-fat meat, fish, and poultry.  Limit fats such as oils, salad dressings, butter, nuts, and avocado.  Keep a food diary to identify foods that cause symptoms.  Avoid foods that cause reflux. These may be different for different people.  Eat frequent small meals instead of three large meals each day.  Eat your meals slowly, in a relaxed setting.  Limit fried foods.  Cook foods using methods other than frying.  Avoid drinking alcohol.  Avoid drinking large amounts of liquids  with your meals.  Avoid bending over or lying down until 2-3 hours after eating. WHAT FOODS ARE NOT RECOMMENDED? The following are some foods and drinks that may worsen your symptoms: Vegetables Tomatoes. Tomato juice. Tomato and spaghetti sauce. Chili peppers. Onion and garlic. Horseradish. Fruits Oranges, grapefruit, and lemon (fruit and juice). Meats High-fat meats, fish, and poultry. This includes hot dogs, ribs, ham, sausage, salami, and bacon. Dairy Whole milk and chocolate milk. Sour cream. Cream. Butter. Ice cream. Cream cheese.  Beverages Coffee and tea, with or without caffeine. Carbonated beverages or energy drinks. Condiments Hot sauce. Barbecue sauce.  Sweets/Desserts Chocolate and cocoa. Donuts. Peppermint and spearmint. Fats and Oils High-fat foods, including [place] fries and potato chips. Other Vinegar. Strong spices, such as black pepper, white pepper, red pepper, cayenne, curry powder, cloves, ginger, and chili powder. The items listed above may not be a complete list of foods and beverages to avoid. Contact your dietitian for more information. Document Released: 12/17/2005 Document Revised: 12/22/2013 Document Reviewed: 10/21/2013 [facility] Patient Information 2015 [place], [place]. This information is not intended to replace advice given to you by your health care provider. Make sure you discuss any questions you have with your health care provider.  Exercise to Lose Weight Exercise and a healthy diet may help you lose weight. Your doctor may suggest specific exercises. EXERCISE IDEAS AND TIPS  Choose low-cost things you enjoy doing, such  as walking, bicycling, or exercising to workout videos.  Take stairs instead of the elevator.  Walk during your lunch break.  Park your car further away from work or school.  Go to a gym or an exercise class.  Start with 5 to 10 minutes of exercise each day. Build up to 30 minutes of exercise 4 to 6 days a week.  Wear  shoes with good support and comfortable clothes.  Stretch before and after working out.  Work out until you breathe harder and your heart beats faster.  Drink extra water when you exercise.  Do not do so much that you hurt yourself, feel dizzy, or get very short of breath. Exercises that burn about 150 calories:  Running 1  miles in 15 minutes.  Playing volleyball for 45 to 60 minutes.  Washing and waxing a car for 45 to 60 minutes.  Playing touch football for 45 minutes.  Walking 1  miles in 35 minutes.  Pushing a stroller 1  miles in 30 minutes.  Playing basketball for 30 minutes.  Raking leaves for 30 minutes.  Bicycling 5 miles in 30 minutes.  Walking 2 miles in 30 minutes.  Dancing for 30 minutes.  Shoveling snow for 15 minutes.  Swimming laps for 20 minutes.  Walking up stairs for 15 minutes.  Bicycling 4 miles in 15 minutes.  Gardening for 30 to 45 minutes.  Jumping rope for 15 minutes.  Washing windows or floors for 45 to 60 minutes. Document Released: 01/19/2011 Document Revised: 03/10/2012 Document Reviewed: 01/19/2011 [facility] Patient Information 2015 [place], [place]. This information is not intended to replace advice given to you by your health care provider. Make sure you discuss any questions you have with your health care provider.

## 2015-04-20 NOTE — Progress Notes (Signed)
Pre visit review using our clinic review tool, if applicable. No additional management support is needed unless otherwise documented below in the visit note. 

## 2015-04-21 ENCOUNTER — Encounter: Payer: Self-pay | Admitting: Internal Medicine

## 2015-04-21 ENCOUNTER — Telehealth: Payer: Self-pay | Admitting: Internal Medicine

## 2015-04-21 DIAGNOSIS — Z0000 Encounter for general adult medical examination without abnormal findings: Secondary | ICD-10-CM | POA: Insufficient documentation

## 2015-04-21 DIAGNOSIS — K219 Gastro-esophageal reflux disease without esophagitis: Secondary | ICD-10-CM | POA: Insufficient documentation

## 2015-04-21 MED ORDER — ALPRAZOLAM 0.25 MG PO TABS: 0.2500 mg | ORAL_TABLET | Freq: Every day | ORAL | Status: DC | PRN

## 2015-04-21 NOTE — Telephone Encounter (Signed)
i can resend protonix----are you ok with approving xanax, thanks

## 2015-04-21 NOTE — Assessment & Plan Note (Signed)
Up to date on pap and tetanus. Did not get flu shot this season. Talked to her about her weight which is her biggest continuing risk for health problems in the future. Checking for complications of her weight today and working on weight loss.

## 2015-04-21 NOTE — Assessment & Plan Note (Signed)
She has gained weight and is eating late at night which is worsening this problem. Protonix in the morning daily for symptoms. If no improvement she will call us back.

## 2015-04-21 NOTE — Progress Notes (Signed)
   Subjective:    Patient ID: Amanda Mcintosh, female    DOB: Oct 28, 1971, 44 y.o.   MRN: [id number]  HPI The patient is a 44 YO female who is coming in for wellness. See A/P for status and treatment of chronic medical problems.   PMH, [facility], social history reviewed and updated at visit.   Review of Systems  Constitutional: Negative for fever, activity change, appetite change and fatigue.  HENT: Negative.   Eyes: Negative.   Respiratory: Negative for cough, chest tightness, shortness of breath and wheezing.   Cardiovascular: Negative for chest pain, palpitations and leg swelling.  Gastrointestinal: Negative for abdominal pain, diarrhea, constipation and abdominal distention.  Musculoskeletal: Negative.   Skin: Negative.   Neurological: Negative.   Psychiatric/Behavioral: Negative.       Objective:   Physical Exam  Constitutional: She is oriented to person, place, and time. She appears well-developed and well-nourished.  HENT:  Head: Normocephalic and atraumatic.  Eyes: EOM are normal.  Neck: Normal range of motion.  Cardiovascular: Normal rate and regular rhythm.   Pulmonary/Chest: Effort normal and breath sounds normal.  Abdominal: Soft.  Musculoskeletal: She exhibits no edema.  Neurological: She is alert and oriented to person, place, and time. Coordination normal.  Skin: Skin is warm and dry.  Psychiatric: She has a normal mood and affect.   Filed Vitals:   04/20/15 0809  BP: 102/80  Pulse: 88  Temp: 97.7 F (36.5 C)  TempSrc: Oral  Resp: 16  Height: 5\' 5"  (1.651 m)  Weight: 244 lb (110.678 kg)  SpO2: 97%   EKG: rate 67, axis normal, intervals normal, possible LVH, rhythm normal, no st or t wave changes and no significant change from prior.     Assessment & Plan:

## 2015-04-21 NOTE — Assessment & Plan Note (Signed)
Checking labs today, trial phentermine for weight loss. Talked to her about diet and exercise as parts of a healthy weight loss strategy. Return in 2 months. EKG normal. Not pregnant.

## 2015-04-21 NOTE — Telephone Encounter (Signed)
Patient had a prescription for pantoprazole (PROTONIX) 40 MG tablet [[id number] sent yesterday to wrong pharmacy. It should be sent to [facility] on [address]. Also she still needs prescription for xanax.

## 2015-04-21 NOTE — Telephone Encounter (Signed)
Have printed please fax.

## 2015-04-22 NOTE — Telephone Encounter (Signed)
Printed, signed and [facility]ed to [facility] at [address] per patient request and dr Doug Sou note

## 2015-04-28 ENCOUNTER — Telehealth: Payer: Self-pay | Admitting: Internal Medicine

## 2015-04-28 NOTE — Telephone Encounter (Signed)
Spoke with patient. Please schedule an acute visit for this patient to be evaluated for a GI referral.

## 2015-04-28 NOTE — Telephone Encounter (Signed)
Patient was in last week and forgot to talk with Dr. Doug Sou about issues she has with her stomach. She will have episodes every now and then while she is on the toilet and start sweating and passes out. She had one last night. This has been going on now for a couple of years. She has seen a GI doctor about this also, but is wondering if she can be referred to an endocrinologist. Please call patient.

## 2015-05-03 ENCOUNTER — Ambulatory Visit (INDEPENDENT_AMBULATORY_CARE_PROVIDER_SITE_OTHER): Payer: BLUE CROSS/BLUE SHIELD | Admitting: Internal Medicine

## 2015-05-03 ENCOUNTER — Encounter: Payer: Self-pay | Admitting: Internal Medicine

## 2015-05-03 VITALS — BP 116/82 | HR 94 | Temp 98.7°F | Resp 18 | Ht 65.0 in | Wt 242.4 lb

## 2015-05-03 DIAGNOSIS — N951 Menopausal and female climacteric states: Secondary | ICD-10-CM

## 2015-05-03 DIAGNOSIS — R55 Syncope and collapse: Secondary | ICD-10-CM | POA: Diagnosis not present

## 2015-05-03 DIAGNOSIS — R232 Flushing: Secondary | ICD-10-CM

## 2015-05-03 NOTE — Patient Instructions (Addendum)
We are going to send you to an endocrinologist but I recommend going back to Dr. Collene Mares for the scope. I will forward her my letter about the visit.

## 2015-05-03 NOTE — Progress Notes (Signed)
Pre visit review using our clinic review tool, if applicable. No additional management support is needed unless otherwise documented below in the visit note. 

## 2015-05-07 DIAGNOSIS — R55 Syncope and collapse: Secondary | ICD-10-CM | POA: Insufficient documentation

## 2015-05-07 NOTE — Assessment & Plan Note (Signed)
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 Talked with her about safety in the bathroom including getting off the toilet and lying down when she feels the symptoms starting. Do not feel this warrants early colonoscopy or EGD but if she wants to ask her GI doctor that's fine. She is demanding referral to endocrinology for these hot flashes. Placed today although do not feel there is a problem in her endocrine system causing this.

## 2015-05-07 NOTE — Progress Notes (Signed)
   Subjective:    Patient ID: Amanda Mcintosh, female    DOB: August 08, 1971, 44 y.o.   MRN: [id number]  HPI The patient is a 44 YO female who is coming in for new problem which has been going on for 10 years. She has infrequent episodes of syncope while trying to move her bowels. She has pre-episode symptoms of tiredness, hot, then passes out. She is concerned for her safety as she may hit her head when she passes out. She sometimes will then move her bowels while passed out. She has had 2 episodes in the last 6 months. She has talked to her GI doctor about it and they do not think she needs another EGD or colonoscopy at this time.   Review of Systems  Constitutional: Negative for fever, activity change, appetite change, fatigue and unexpected weight change.  Respiratory: Negative for cough, chest tightness, shortness of breath and wheezing.   Cardiovascular: Negative for chest pain, palpitations and leg swelling.  Gastrointestinal: Negative for abdominal pain, diarrhea, constipation and abdominal distention.  Musculoskeletal: Negative.   Neurological: Positive for syncope. Negative for dizziness, seizures, weakness, light-headedness, numbness and headaches.      Objective:   Physical Exam  Constitutional: She is oriented to person, place, and time. She appears well-developed and well-nourished.  Overweight  HENT:  Head: Normocephalic and atraumatic.  Eyes: EOM are normal.  Neck: Normal range of motion.  Cardiovascular: Normal rate and regular rhythm.   Pulmonary/Chest: Effort normal and breath sounds normal.  Abdominal: Soft. She exhibits no distension. There is no tenderness. There is no rebound.  Neurological: She is alert and oriented to person, place, and time. Coordination normal.  Skin: Skin is warm and dry.   Filed Vitals:   05/03/15 0955  BP: 116/82  Pulse: 94  Temp: 98.7 F (37.1 C)  TempSrc: Oral  Resp: 18  Height: 5\' 5"  (1.651 m)  Weight: 242 lb 6.4 oz (109.952 kg)    SpO2: 95%      Assessment & Plan:

## 2015-05-12 ENCOUNTER — Ambulatory Visit: Payer: BLUE CROSS/BLUE SHIELD | Admitting: Endocrinology

## 2015-05-27 ENCOUNTER — Ambulatory Visit: Payer: BLUE CROSS/BLUE SHIELD | Admitting: Endocrinology

## 2015-06-16 LAB — HM MAMMOGRAPHY

## 2015-06-20 ENCOUNTER — Encounter: Payer: Self-pay | Admitting: Internal Medicine

## 2015-06-20 ENCOUNTER — Ambulatory Visit (INDEPENDENT_AMBULATORY_CARE_PROVIDER_SITE_OTHER): Payer: 59 | Admitting: Internal Medicine

## 2015-06-20 NOTE — Patient Instructions (Signed)
I'm sorry to hear about your mom and know that I'm thinking of your family in this hard time.   Keep using the phentermine to work on weight loss.   Come back in about 3 months to check on the weight.   Exercise to Lose Weight Exercise and a healthy diet may help you lose weight. Your doctor may suggest specific exercises. EXERCISE IDEAS AND TIPS  Choose low-cost things you enjoy doing, such as walking, bicycling, or exercising to workout videos.  Take stairs instead of the elevator.  Walk during your lunch break.  Park your car further away from work or school.  Go to a gym or an exercise class.  Start with 5 to 10 minutes of exercise each day. Build up to 30 minutes of exercise 4 to 6 days a week.  Wear shoes with good support and comfortable clothes.  Stretch before and after working out.  Work out until you breathe harder and your heart beats faster.  Drink extra water when you exercise.  Do not do so much that you hurt yourself, feel dizzy, or get very short of breath. Exercises that burn about 150 calories:  Running 1  miles in 15 minutes.  Playing volleyball for 45 to 60 minutes.  Washing and waxing a car for 45 to 60 minutes.  Playing touch football for 45 minutes.  Walking 1  miles in 35 minutes.  Pushing a stroller 1  miles in 30 minutes.  Playing basketball for 30 minutes.  Raking leaves for 30 minutes.  Bicycling 5 miles in 30 minutes.  Walking 2 miles in 30 minutes.  Dancing for 30 minutes.  Shoveling snow for 15 minutes.  Swimming laps for 20 minutes.  Walking up stairs for 15 minutes.  Bicycling 4 miles in 15 minutes.  Gardening for 30 to 45 minutes.  Jumping rope for 15 minutes.  Washing windows or floors for 45 to 60 minutes. Document Released: 01/19/2011 Document Revised: 03/10/2012 Document Reviewed: 01/19/2011 [facility] Patient Information 2015 [place], [place]. This information is not intended to replace advice given to you by  your health care provider. Make sure you discuss any questions you have with your health care provider.

## 2015-06-20 NOTE — Progress Notes (Signed)
Pre visit review using our clinic review tool, if applicable. No additional management support is needed unless otherwise documented below in the visit note. 

## 2015-06-22 NOTE — Assessment & Plan Note (Signed)
Weight is actually up since starting the phentermine and she is not taking it appropriately. Talked to her about the fact that we can continue and see her back in 2 months but if the medicine is not working then we should stop.

## 2015-06-22 NOTE — Progress Notes (Signed)
   Subjective:    Patient ID: Amanda Mcintosh, female    DOB: 1971/04/02, 44 y.o.   MRN: [id number]  HPI The patient is a 43 YO female coming in for morbid obesity. She has been taking phentermine but not much of the time. She did notice some benefit from taking it. She is having problems with her mother's health and needs to travel to [place] to help out. Not changed her diet much. No new problems.   Review of Systems  Constitutional: Negative for fever, activity change, appetite change, fatigue and unexpected weight change.  Respiratory: Negative for cough, chest tightness, shortness of breath and wheezing.   Cardiovascular: Negative for chest pain, palpitations and leg swelling.  Gastrointestinal: Negative for abdominal pain, diarrhea, constipation and abdominal distention.  Musculoskeletal: Negative.   Skin: Negative.   Neurological: Negative for dizziness, seizures, weakness, light-headedness, numbness and headaches.      Objective:   Physical Exam  Constitutional: She is oriented to person, place, and time. She appears well-developed and well-nourished.  Overweight  HENT:  Head: Normocephalic and atraumatic.  Eyes: EOM are normal.  Neck: Normal range of motion.  Cardiovascular: Normal rate and regular rhythm.   Pulmonary/Chest: Effort normal and breath sounds normal.  Abdominal: Soft. She exhibits no distension. There is no tenderness. There is no rebound.  Neurological: She is alert and oriented to person, place, and time. Coordination normal.  Skin: Skin is warm and dry.   Filed Vitals:   06/20/15 1544  BP: 112/66  Pulse: 100  Temp: 98.2 F (36.8 C)  TempSrc: Oral  Resp: 14  Height: 5\' 5"  (1.651 m)  Weight: 246 lb (111.585 kg)  SpO2: 96%      Assessment & Plan:

## 2015-06-28 ENCOUNTER — Ambulatory Visit (INDEPENDENT_AMBULATORY_CARE_PROVIDER_SITE_OTHER): Payer: 59 | Admitting: Endocrinology

## 2015-06-28 ENCOUNTER — Encounter: Payer: Self-pay | Admitting: Endocrinology

## 2015-06-28 VITALS — BP 134/70 | HR 68 | Temp 98.3°F | Ht 65.0 in | Wt 244.0 lb

## 2015-06-28 DIAGNOSIS — N912 Amenorrhea, unspecified: Secondary | ICD-10-CM | POA: Insufficient documentation

## 2015-06-28 NOTE — Patient Instructions (Addendum)
blood tests are requested for you today.  We'll let you know about the results. Also, let's check a 24-HR urine test, for "adrenaline."  For best results, start the collection just after your next episode of symptoms. i would be happy to prescribe for you a pill called "levsin," that you put under your tongue, at the first sign of an episode. I would also be happy to see you back here if you wish.

## 2015-06-28 NOTE — Progress Notes (Signed)
Subjective:    Patient ID: Amanda Mcintosh, female    DOB: 06-25-1971, 44 y.o.   MRN: [id number]  HPI Pt states 10 years of intermittent episodes of severe diaphoresis, and assoc lightheadedness.  She also gets bloating of the abdomen.  She gets these episodes only a few times per year, and they last 15 minutes at a time.  She is unable to cite precip factor.  She has no menses x 1 year.  She has no h/o migraine, HTN, or drug abuse.   Past Medical History  Diagnosis Date  . Fibroids   . History of headache   . Vitamin D deficiency   . Obesity     Past Surgical History  Procedure Laterality Date  . Cholecystectomy  2006  . Wisdom tooth extraction    . Tonsillectomy    . Robot assisted myomectomy  2011    History   Social History  . Marital Status: Single    Spouse Name: N/A  . Number of Children: 1  . Years of Education: N/A   Occupational History  . MANAGER    Social History Main Topics  . Smoking status: Current Every Day Smoker -- 0.30 packs/day    Types: Cigarettes  . Smokeless tobacco: Not on file  . Alcohol Use: No  . Drug Use: No  . Sexual Activity: Not on file   Other Topics Concern  . Not on file   Social History Narrative    Current Outpatient Prescriptions on File Prior to Visit  Medication Sig Dispense Refill  . ALPRAZolam (XANAX) 0.25 MG tablet Take 1 tablet (0.25 mg total) by mouth daily as needed (flying). 10 tablet 0  . pantoprazole (PROTONIX) 40 MG tablet Take 1 tablet (40 mg total) by mouth daily. 30 tablet 3  . phentermine 37.5 MG capsule Take 1 capsule (37.5 mg total) by mouth every morning. 30 capsule 2  . Cholecalciferol (VITAMIN D) 2000 UNITS tablet Take 1 tablet (2,000 Units total) by mouth daily. (Patient not taking: Reported on 06/28/2015) 30 tablet 11  . ferrous sulfate 325 (65 FE) MG tablet Take 1 tablet (325 mg total) by mouth daily. (Patient not taking: Reported on 06/28/2015)     No current facility-administered medications on  file prior to visit.    Allergies  Allergen Reactions  . Gadolinium Derivatives Other (See Comments)    PT STARTED SHAKING AND FELT VERY COLD INTERNALLY BUT FACE WAS VERY FLUSHED. THIS WAS A DELAYED REACTION (ABOUT 1 HOUR AFTER INJECTION). SHE TOOK BENADRYL AND HYDRATED AND SYMPTOMS SUBSIDED    Family History  Problem Relation Age of Onset  . Diabetes Mother   . Hypertension Mother   . Heart failure Mother   . Stroke Mother   . Breast cancer Sister   . Skin cancer Sister   . HIV/AIDS Brother   . COPD Father     smoker    BP 134/70 mmHg  Pulse 68  Temp(Src) 98.3 F (36.8 C) (Oral)  Ht 5\' 5"  (1.651 m)  Wt 244 lb (110.678 kg)  BMI 40.60 kg/m2  SpO2 97%  Review of Systems denies weight loss, hoarseness, visual loss, sob, polyuria, myalgias, rash, numbness, tremor, easy bruising, and rhinorrhea.  She has intermittent headache, anxiety, diarrhea, and palpitations.      Objective:   Physical Exam VS: see vs page GEN: no distress HEAD: head: no deformity eyes: no periorbital swelling, no proptosis external nose and ears are normal mouth: no lesion seen NECK:  supple, thyroid is not enlarged CHEST WALL: no deformity LUNGS:  Clear to auscultation. CV: reg rate and rhythm, no murmur ABD: abdomen is soft, nontender.  no hepatosplenomegaly.  not distended.  no hernia MUSCULOSKELETAL: muscle bulk and strength are grossly normal.  no obvious joint swelling.  gait is normal and steady EXTEMITIES: no deformity.  no ulcer on the feet.  feet are of normal color and temp.  no edema PULSES: dorsalis pedis intact bilat.  no carotid bruit NEURO:  cn 2-12 grossly intact.   readily moves all 4's.  sensation is intact to touch on the feet SKIN:  Normal texture and temperature.  No rash or suspicious lesion is visible.  Not diaphoretic now.  No cafe-au-lait spots. NODES:  None palpable at the neck PSYCH: alert, well-oriented.  Does not appear anxious nor depressed.    Lab Results    Component Value Date   TSH 0.92 04/20/2015      Assessment & Plan:  sxs of excessive diaphoresis, new to me, uncertain etiology.  meds and menopause are unlikely causes of the symptoms, as the symptoms predated these. Amenorrhea, ? Menopause.  Patient is advised the following: Patient Instructions  blood tests are requested for you today.  We'll let you know about the results. Also, let's check a 24-HR urine test, for "adrenaline."  For best results, start the collection just after your next episode of symptoms. i would be happy to prescribe for you a pill called "levsin," that you put under your tongue, at the first sign of an episode. I would also be happy to see you back here if you wish.

## 2015-06-29 LAB — LUTEINIZING HORMONE: LH: 1.74 m[IU]/mL

## 2015-06-29 LAB — FOLLICLE STIMULATING HORMONE: FSH: 4.4 m[IU]/mL

## 2015-06-30 ENCOUNTER — Encounter: Payer: Self-pay | Admitting: Internal Medicine

## 2015-07-11 ENCOUNTER — Other Ambulatory Visit: Payer: 59

## 2015-07-11 ENCOUNTER — Other Ambulatory Visit: Payer: Self-pay

## 2015-07-11 DIAGNOSIS — N912 Amenorrhea, unspecified: Secondary | ICD-10-CM

## 2015-07-14 LAB — CATECHOLAMINES, FRACTIONATED, URINE, 24 HOUR
Calculated Total (E+NE): 9 mcg/24 h — ABNORMAL LOW (ref 26–121)
Creatinine, Urine mg/day-CATEUR: 1.15 g/(24.h) (ref 0.63–2.50)
Dopamine, 24 hr Urine: 243 mcg/24 h (ref 52–480)
Norepinephrine, 24 hr Ur: 9 mcg/24 h — ABNORMAL LOW (ref 15–100)
TOTAL VOLUME - CF 24HR U: 1700 mL

## 2015-07-18 ENCOUNTER — Other Ambulatory Visit: Payer: Self-pay | Admitting: Geriatric Medicine

## 2015-07-18 LAB — METANEPHRINES, URINE, 24 HOUR
METANEPH TOTAL UR: 247 ug/(24.h) (ref 182–739)
Metanephrines, Ur: 81 mcg/24 h (ref 58–203)
Normetanephrine, 24H Ur: 166 mcg/24 h (ref 88–649)

## 2015-07-18 MED ORDER — PANTOPRAZOLE SODIUM 40 MG PO TBEC: 40.0000 mg | DELAYED_RELEASE_TABLET | Freq: Every day | ORAL | Status: DC

## 2015-09-20 ENCOUNTER — Ambulatory Visit: Payer: Self-pay | Admitting: Internal Medicine

## 2015-10-27 ENCOUNTER — Ambulatory Visit (INDEPENDENT_AMBULATORY_CARE_PROVIDER_SITE_OTHER): Payer: 59 | Admitting: Internal Medicine

## 2015-10-27 ENCOUNTER — Encounter: Payer: Self-pay | Admitting: Internal Medicine

## 2015-10-27 VITALS — BP 128/82 | HR 83 | Temp 98.1°F | Ht 65.0 in | Wt 249.0 lb

## 2015-10-27 DIAGNOSIS — J45909 Unspecified asthma, uncomplicated: Secondary | ICD-10-CM | POA: Diagnosis not present

## 2015-10-27 DIAGNOSIS — J209 Acute bronchitis, unspecified: Secondary | ICD-10-CM

## 2015-10-27 DIAGNOSIS — J019 Acute sinusitis, unspecified: Secondary | ICD-10-CM | POA: Diagnosis not present

## 2015-10-27 MED ORDER — AZITHROMYCIN 250 MG PO TABS: ORAL_TABLET | ORAL | Status: DC

## 2015-10-27 MED ORDER — NICOTINE 14 MG/24HR TD PT24: 14.0000 mg | MEDICATED_PATCH | Freq: Every day | TRANSDERMAL | Status: DC

## 2015-10-27 MED ORDER — ALBUTEROL SULFATE HFA 108 (90 BASE) MCG/ACT IN AERS: 2.0000 | INHALATION_SPRAY | Freq: Four times a day (QID) | RESPIRATORY_TRACT | Status: DC | PRN

## 2015-10-27 MED ORDER — HYDROCOD POLST-CPM POLST ER 10-8 MG/5ML PO SUER: 5.0000 mL | Freq: Two times a day (BID) | ORAL | Status: DC | PRN

## 2015-10-27 NOTE — Progress Notes (Signed)
Subjective:    Patient ID: Amanda Mcintosh, female    DOB: 12-Apr-1971, 44 y.o.   MRN: [id number]  HPI She is here for cold symptoms.  She has been traveling and just returned home.  Her cold symtpoms started about 5 days ago and have been worsening.  She states chills, nasal congestion, sinus pressure, cough that is productive of discolored phlegm, shortness of breath, wheezing, headaches and lightheadedness. She has been taking Mucinex, Alka-Seltzer plus and other medications without improvement. She currently smokes, but knows she needs to quit. She has a history of sinus infections and bronchitis requiring antibiotics.    Medications and allergies reviewed with patient and updated if appropriate.  Patient Active Problem List   Diagnosis Date Noted  . Amenorrhea 06/28/2015  . Situational syncope 05/07/2015  . GERD (gastroesophageal reflux disease) 04/21/2015  . Routine general medical examination at a health care facility 04/21/2015  . Unspecified vitamin D deficiency 12/02/2013  . Morbid obesity ([place]) 12/02/2013  . Iron deficiency anemia 12/02/2013  . Tobacco use disorder 12/02/2013  . Migraine without aura, with intractable migraine, so stated, without mention of status migrainosus 03/11/2013    Past Medical History  Diagnosis Date  . Fibroids   . History of headache   . Vitamin D deficiency   . Obesity     Past Surgical History  Procedure Laterality Date  . Cholecystectomy  2006  . Wisdom tooth extraction    . Tonsillectomy    . Robot assisted myomectomy  2011    Social History   Social History  . Marital Status: Single    Spouse Name: N/A  . Number of Children: 1  . Years of Education: N/A   Occupational History  . MANAGER    Social History Main Topics  . Smoking status: Current Every Day Smoker -- 0.30 packs/day    Types: Cigarettes  . Smokeless tobacco: None  . Alcohol Use: No  . Drug Use: No  . Sexual Activity: Not Asked   Other Topics  Concern  . None   Social History Narrative    Review of Systems  Constitutional: Positive for chills. Negative for fever.  HENT: Positive for congestion and sinus pressure. Negative for ear pain and sore throat.   Respiratory: Positive for cough (productive of green and yellow mucus), shortness of breath and wheezing (at night).   Cardiovascular: Negative for chest pain.  Gastrointestinal: Negative for nausea and diarrhea.  Neurological: Positive for dizziness, light-headedness and headaches.       Objective:   Filed Vitals:   10/27/15 1140  BP: 128/82  Pulse: 83  Temp: 98.1 F (36.7 C)   Filed Weights   10/27/15 1140  Weight: 249 lb (112.946 kg)   Body mass index is 41.44 kg/(m^2).   Physical Exam  Constitutional: She appears well-developed and well-nourished.  HENT:  Head: Normocephalic and atraumatic.  Right Ear: External ear normal.  Left Ear: External ear normal.  Nose: Nose normal.  Mouth/Throat: Oropharynx is clear and moist. No oropharyngeal exudate.  Eyes: Conjunctivae are normal.  Neck: Neck supple. No tracheal deviation present. No thyromegaly present.  Cardiovascular: Normal rate, regular rhythm and normal heart sounds.   No murmur heard. Pulmonary/Chest: Effort normal. No respiratory distress. She has wheezes (diffuse expiratory wheeze, mild). She has no rales.  Musculoskeletal: She exhibits no edema.  Lymphadenopathy:    She has no cervical adenopathy.  Skin: Skin is warm and dry.  Assessment & Plan:   Acute sinusitis, acute bronchitis with reactive airway disease/wheezing I am concerned she may have a bacterial component to her illness and we will go ahead and start antibiotic azithromycin Albuterol inhaler prescribed Cough syrup for nighttime, I did explain this, certainly taken twice a day but may make her drowsy Continue over-the-counter cold medications as needed Increase rest and fluids  Call if no improvement

## 2015-10-27 NOTE — Patient Instructions (Signed)
Your prescription have been sent to your pharmacy.  Take them as directed and call if your symptoms do not improve.  Acute Bronchitis Bronchitis is inflammation of the airways that extend from the windpipe into the lungs (bronchi). The inflammation often causes mucus to develop. This leads to a cough, which is the most common symptom of bronchitis.  In acute bronchitis, the condition usually develops suddenly and goes away over time, usually in a couple weeks. Smoking, allergies, and asthma can make bronchitis worse. Repeated episodes of bronchitis may cause further lung problems.  CAUSES Acute bronchitis is most often caused by the same virus that causes a cold. The virus can spread from person to person (contagious) through coughing, sneezing, and touching contaminated objects. SIGNS AND SYMPTOMS   Cough.   Fever.   Coughing up mucus.   Body aches.   Chest congestion.   Chills.   Shortness of breath.   Sore throat.  DIAGNOSIS  Acute bronchitis is usually diagnosed through a physical exam. Your health care provider will also ask you questions about your medical history. Tests, such as chest X-rays, are sometimes done to rule out other conditions.  TREATMENT  Acute bronchitis usually goes away in a couple weeks. Oftentimes, no medical treatment is necessary. Medicines are sometimes given for relief of fever or cough. Antibiotic medicines are usually not needed but may be prescribed in certain situations. In some cases, an inhaler may be recommended to help reduce shortness of breath and control the cough. A cool mist vaporizer may also be used to help thin bronchial secretions and make it easier to clear the chest.  HOME CARE INSTRUCTIONS  Get plenty of rest.   Drink enough fluids to keep your urine clear or pale yellow (unless you have a medical condition that requires fluid restriction). Increasing fluids may help thin your respiratory secretions (sputum) and reduce chest  congestion, and it will prevent dehydration.   Take medicines only as directed by your health care provider.  If you were prescribed an antibiotic medicine, finish it all even if you start to feel better.  Avoid smoking and secondhand smoke. Exposure to cigarette smoke or irritating chemicals will make bronchitis worse. If you are a smoker, consider using nicotine gum or skin patches to help control withdrawal symptoms. Quitting smoking will help your lungs heal faster.   Reduce the chances of another bout of acute bronchitis by washing your hands frequently, avoiding people with cold symptoms, and trying not to touch your hands to your mouth, nose, or eyes.   Keep all follow-up visits as directed by your health care provider.  SEEK MEDICAL CARE IF: Your symptoms do not improve after 1 week of treatment.  SEEK IMMEDIATE MEDICAL CARE IF:  You develop an increased fever or chills.   You have chest pain.   You have severe shortness of breath.  You have bloody sputum.   You develop dehydration.  You faint or repeatedly feel like you are going to pass out.  You develop repeated vomiting.  You develop a severe headache. MAKE SURE YOU:   Understand these instructions.  Will watch your condition.  Will get help right away if you are not doing well or get worse.   This information is not intended to replace advice given to you by your health care provider. Make sure you discuss any questions you have with your health care provider.   Document Released: 01/24/2005 Document Revised: 01/07/2015 Document Reviewed: 06/09/2013 Elsevier Interactive Patient Education  2016 [employer].

## 2016-01-09 ENCOUNTER — Ambulatory Visit: Payer: 59 | Admitting: Family

## 2016-01-13 ENCOUNTER — Encounter: Payer: Self-pay | Admitting: Family

## 2016-01-13 ENCOUNTER — Ambulatory Visit (INDEPENDENT_AMBULATORY_CARE_PROVIDER_SITE_OTHER): Payer: 59 | Admitting: Family

## 2016-01-13 VITALS — BP 120/82 | HR 85 | Temp 98.2°F | Resp 16 | Ht 65.0 in | Wt 244.0 lb

## 2016-01-13 DIAGNOSIS — R059 Cough, unspecified: Secondary | ICD-10-CM | POA: Insufficient documentation

## 2016-01-13 DIAGNOSIS — R05 Cough: Secondary | ICD-10-CM | POA: Diagnosis not present

## 2016-01-13 DIAGNOSIS — M7712 Lateral epicondylitis, left elbow: Secondary | ICD-10-CM | POA: Diagnosis not present

## 2016-01-13 DIAGNOSIS — F17200 Nicotine dependence, unspecified, uncomplicated: Secondary | ICD-10-CM

## 2016-01-13 DIAGNOSIS — M7710 Lateral epicondylitis, unspecified elbow: Secondary | ICD-10-CM | POA: Insufficient documentation

## 2016-01-13 MED ORDER — DICLOFENAC SODIUM 2 % TD SOLN: 1.0000 "application " | Freq: Two times a day (BID) | TRANSDERMAL | Status: DC

## 2016-01-13 MED ORDER — AEROCHAMBER PLUS W/MASK MISC: Status: DC

## 2016-01-13 MED ORDER — NICOTINE 14 MG/24HR TD PT24: 14.0000 mg | MEDICATED_PATCH | Freq: Every day | TRANSDERMAL | Status: DC

## 2016-01-13 MED ORDER — PREDNISONE 10 MG (21) PO TBPK: ORAL_TABLET | ORAL | Status: DC

## 2016-01-13 NOTE — Progress Notes (Signed)
Subjective:    Patient ID: Amanda Mcintosh, female    DOB: [birth year], 45 y.o.   MRN: [id number]  Chief Complaint  Patient presents with  . Cough    has had a cough that has been going on for several months now and was put on omnicef and that has not helped, also has had a pain in left arm from elbow to fingers    HPI:  Amanda Mcintosh is a 45 y.o. female who  has a past medical history of Fibroids; History of headache; Vitamin D deficiency; and Obesity. and presents today for an acute office visit.   1.) Cough - Previously seen in the office for a cough and diagnosed with sinusitis, bronchitis and reactive airway disease and was treated with azithromycin, albuterol, and tussinex. She reports not taking albuterol but 2-3 times and completed the prescription of ENT. The ENT prescribed her [name] which has not helped. Denies fevers. OTC medications include nyquil and alkaseltzer plus which has not helped.  2.) Arm pain - This is a new problem. Asssociated symptom of pain located in around the outside of her elbow has been going on for about 2 months. Pain is described as sharp located in her wrist and achyness in her left elbow. There is decreased weakness. Denies any symptoms that make it better. Does do an excessive amount of typing at work. Some tingling in her fingers on occasion.  3..) Tobacco use - previously attempted to quit smoking however had personal circumstances prevented her from taking the previously prescribed medication. She is interested in re-attempting to quit smoking.  Allergies  Allergen Reactions  . Gadolinium Derivatives Other (See Comments)    PT STARTED SHAKING AND FELT VERY COLD INTERNALLY BUT FACE WAS VERY FLUSHED. THIS WAS A DELAYED REACTION (ABOUT 1 HOUR AFTER INJECTION). SHE TOOK BENADRYL AND HYDRATED AND SYMPTOMS SUBSIDED     Current Outpatient Prescriptions on File Prior to Visit  Medication Sig Dispense Refill  . pantoprazole (PROTONIX) 40 MG  tablet Take 1 tablet (40 mg total) by mouth daily. 30 tablet 3   No current facility-administered medications on file prior to visit.    Past Medical History  Diagnosis Date  . Fibroids   . History of headache   . Vitamin D deficiency   . Obesity     Review of Systems  Constitutional: Negative for fever and chills.  HENT: Positive for congestion.   Respiratory: Positive for cough and wheezing. Negative for chest tightness.   Musculoskeletal:       Positive for left elbow pain.  Neurological: Positive for weakness and numbness.      Objective:    BP 120/82 mmHg  Pulse 85  Temp(Src) 98.2 F (36.8 C) (Oral)  Resp 16  Ht 5\' 5"  (1.651 m)  Wt 244 lb (110.678 kg)  BMI 40.60 kg/m2  SpO2 96% Nursing note and vital signs reviewed.  Physical Exam  Constitutional: She is oriented to person, place, and time. She appears well-developed and well-nourished. No distress.  HENT:  Right Ear: Hearing, tympanic membrane, external ear and ear canal normal.  Left Ear: Hearing, tympanic membrane, external ear and ear canal normal.  Nose: Nose normal. Right sinus exhibits no maxillary sinus tenderness and no frontal sinus tenderness. Left sinus exhibits no maxillary sinus tenderness and no frontal sinus tenderness.  Mouth/Throat: Uvula is midline, oropharynx is clear and moist and mucous membranes are normal.  Cardiovascular: Normal rate, regular rhythm, normal heart sounds and intact  distal pulses.   Pulmonary/Chest: Effort normal and breath sounds normal.  Musculoskeletal:  Left elbow - no obvious deformity, discoloration, or edema noted. Tenderness elicited over lateral upper condyle and wrist extensors. Range of motion is intact and appropriate with discomfort during wrist extension and supination. Distal pulses are intact and appropriate. Negative Tinel sign and Phalen's test.  Neurological: She is alert and oriented to person, place, and time.  Skin: Skin is warm and dry.  Psychiatric:  She has a normal mood and affect. Her behavior is normal. Judgment and thought content normal.       Assessment & Plan:   Problem List Items Addressed This Visit      Musculoskeletal and Integument   Lateral epicondylitis    Symptoms and exam consistent with lateral epicondylitis. Start Pennsaid. Ice and stretches multiple times per day. Recommend tennis elbow strap for support. Follow up if symptoms worsen or fail to improve.       Relevant Medications   predniSONE (STERAPRED UNI-PAK 21 TAB) 10 MG (21) TBPK tablet   Diclofenac Sodium (PENNSAID) 2 % SOLN     Other   Tobacco use disorder    Discussed and encouraged smoking cessation. Start nicotene patches. Follow up as needed.       Relevant Medications   nicotine ([employer] NICOTINE TRANSDERMAL SYS) 14 mg/24hr patch   Cough - Primary    Symptoms of cough of questionable origin unlikely to be bacterial with concern for possible GERD or reactive airway disease. Start prednisone. Continue previously prescribed albuterol. Start aero chamber as needed with inhaler. Refer to per pulmonary function tests. Follow-up if symptoms worsen or fail to improve.      Relevant Medications   predniSONE (STERAPRED UNI-PAK 21 TAB) 10 MG (21) TBPK tablet   Spacer/Aero-Holding Chambers (AEROCHAMBER PLUS WITH MASK) inhaler   Other Relevant Orders   Pulmonary function test

## 2016-01-13 NOTE — Assessment & Plan Note (Signed)
Symptoms of cough of questionable origin unlikely to be bacterial with concern for possible GERD or reactive airway disease. Start prednisone. Continue previously prescribed albuterol. Start aero chamber as needed with inhaler. Refer to per pulmonary function tests. Follow-up if symptoms worsen or fail to improve.

## 2016-01-13 NOTE — Assessment & Plan Note (Signed)
Symptoms and exam consistent with lateral epicondylitis. Start Pennsaid. Ice and stretches multiple times per day. Recommend tennis elbow strap for support. Follow up if symptoms worsen or fail to improve.

## 2016-01-13 NOTE — Patient Instructions (Signed)
Thank you for choosing [facility].  Summary/Instructions:  Apply Pennsaid 2 times daily with pinky size dose on your left elbow.  Start the prednisone for cough and inflammation.  Recommend ice for your elbow and check your ergonomics with your workplace.  Recommend a tennis elbow strap for compression.    Your prescription(s) have been submitted to your pharmacy or been printed and provided for you. Please take as directed and contact our office if you believe you are having problem(s) with the medication(s) or have any questions.  Referrals have been made during this visit. You should expect to hear back from our schedulers in about 7-10 days in regards to establishing an appointment with the specialists we discussed.   If your symptoms worsen or fail to improve, please contact our office for further instruction, or in case of emergency go directly to the emergency room at the closest medical facility.    Lateral Epicondylitis With Rehab Lateral epicondylitis involves inflammation and pain around the outer portion of the elbow. The pain is caused by inflammation of the tendons in the forearm that bring back (extend) the wrist. Lateral epicondylitis is also called tennis elbow, because it is very common in tennis players. However, it may occur in any individual who extends the wrist repetitively. If lateral epicondylitis is left untreated, it may become a chronic problem. SYMPTOMS   Pain, tenderness, and inflammation on the outer (lateral) side of the elbow.  Pain or weakness with gripping activities.  Pain that increases with wrist-twisting motions (playing tennis, using a screwdriver, opening a door or a jar).  Pain with lifting objects, including a coffee cup. CAUSES  Lateral epicondylitis is caused by inflammation of the tendons that extend the wrist. Causes of injury may include:  Repetitive stress and strain on the muscles and tendons that extend the wrist.  Sudden  change in activity level or intensity.  Incorrect grip in racquet sports.  Incorrect grip size of racquet (often too large).  Incorrect hitting position or technique (usually backhand, leading with the elbow).  Using a racket that is too heavy. RISK INCREASES WITH:  Sports or occupations that require repetitive and/or strenuous forearm and wrist movements (tennis, squash, racquetball, carpentry).  Poor wrist and forearm strength and flexibility.  Failure to warm up properly before activity.  Resuming activity before healing, rehabilitation, and conditioning are complete. PREVENTION   Warm up and stretch properly before activity.  Maintain physical fitness:  Strength, flexibility, and endurance.  Cardiovascular fitness.  Wear and use properly fitted equipment.  Learn and use proper technique and have a coach correct improper technique.  Wear a tennis elbow (counterforce) brace. PROGNOSIS  The course of this condition depends on the degree of the injury. If treated properly, acute cases (symptoms lasting less than 4 weeks) are often resolved in 2 to 6 weeks. Chronic (longer lasting cases) often resolve in 3 to 6 months but may require physical therapy. RELATED COMPLICATIONS   Frequently recurring symptoms, resulting in a chronic problem. Properly treating the problem the first time decreases frequency of recurrence.  Chronic inflammation, scarring tendon degeneration, and partial tendon tear, requiring surgery.  Delayed healing or resolution of symptoms. TREATMENT  Treatment first involves the use of ice and medicine to reduce pain and inflammation. Strengthening and stretching exercises may help reduce discomfort if performed regularly. These exercises may be performed at home if the condition is an acute injury. Chronic cases may require a referral to a physical therapist for evaluation and  treatment. Your caregiver may advise a corticosteroid injection to help reduce  inflammation. Rarely, surgery is needed. MEDICATION  If pain medicine is needed, nonsteroidal anti-inflammatory medicines (aspirin and ibuprofen), or other minor pain relievers (acetaminophen), are often advised.  Do not take pain medicine for 7 days before surgery.  Prescription pain relievers may be given, if your caregiver thinks they are needed. Use only as directed and only as much as you need.  Corticosteroid injections may be recommended. These injections should be reserved only for the most severe cases, because they can only be given a certain number of times. HEAT AND COLD  Cold treatment (icing) should be applied for 10 to 15 minutes every 2 to 3 hours for inflammation and pain, and immediately after activity that aggravates your symptoms. Use ice packs or an ice massage.  Heat treatment may be used before performing stretching and strengthening activities prescribed by your caregiver, physical therapist, or athletic trainer. Use a heat pack or a warm water soak. SEEK MEDICAL CARE IF: Symptoms get worse or do not improve in 2 weeks, despite treatment. EXERCISES  RANGE OF MOTION (ROM) AND STRETCHING EXERCISES - Epicondylitis, Lateral (Tennis Elbow) These exercises may help you when beginning to rehabilitate your injury. Your symptoms may go away with or without further involvement from your physician, physical therapist, or athletic trainer. While completing these exercises, remember:   Restoring tissue flexibility helps normal motion to return to the joints. This allows healthier, less painful movement and activity.  An effective stretch should be held for at least 30 seconds.  A stretch should never be painful. You should only feel a gentle lengthening or release in the stretched tissue. RANGE OF MOTION - Wrist Flexion, Active-Assisted  Extend your right / left elbow with your fingers pointing down.*  Gently pull the back of your hand towards you, until you feel a gentle  stretch on the top of your forearm.  Hold this position for __________ seconds. Repeat __________ times. Complete this exercise __________ times per day.  *If directed by your physician, physical therapist or athletic trainer, complete this stretch with your elbow bent, rather than extended. RANGE OF MOTION - Wrist Extension, Active-Assisted  Extend your right / left elbow and turn your palm upwards.*  Gently pull your palm and fingertips back, so your wrist extends and your fingers point more toward the ground.  You should feel a gentle stretch on the inside of your forearm.  Hold this position for __________ seconds. Repeat __________ times. Complete this exercise __________ times per day. *If directed by your physician, physical therapist or athletic trainer, complete this stretch with your elbow bent, rather than extended. STRETCH - Wrist Flexion  Place the back of your right / left hand on a tabletop, leaving your elbow slightly bent. Your fingers should point away from your body.  Gently press the back of your hand down onto the table by straightening your elbow. You should feel a stretch on the top of your forearm.  Hold this position for __________ seconds. Repeat __________ times. Complete this stretch __________ times per day.  STRETCH - Wrist Extension   Place your right / left fingertips on a tabletop, leaving your elbow slightly bent. Your fingers should point backwards.  Gently press your fingers and palm down onto the table by straightening your elbow. You should feel a stretch on the inside of your forearm.  Hold this position for __________ seconds. Repeat __________ times. Complete this stretch __________ times per  day.  STRENGTHENING EXERCISES - Epicondylitis, Lateral (Tennis Elbow) These exercises may help you when beginning to rehabilitate your injury. They may resolve your symptoms with or without further involvement from your physician, physical therapist, or  athletic trainer. While completing these exercises, remember:   Muscles can gain both the endurance and the strength needed for everyday activities through controlled exercises.  Complete these exercises as instructed by your physician, physical therapist or athletic trainer. Increase the resistance and repetitions only as guided.  You may experience muscle soreness or fatigue, but the pain or discomfort you are trying to eliminate should never worsen during these exercises. If this pain does get worse, stop and make sure you are following the directions exactly. If the pain is still present after adjustments, discontinue the exercise until you can discuss the trouble with your caregiver. STRENGTH - Wrist Flexors  Sit with your right / left forearm palm-up and fully supported on a table or countertop. Your elbow should be resting below the height of your shoulder. Allow your wrist to extend over the edge of the surface.  Loosely holding a __________ weight, or a piece of rubber exercise band or tubing, slowly curl your hand up toward your forearm.  Hold this position for __________ seconds. Slowly lower the wrist back to the starting position in a controlled manner. Repeat __________ times. Complete this exercise __________ times per day.  STRENGTH - Wrist Extensors  Sit with your right / left forearm palm-down and fully supported on a table or countertop. Your elbow should be resting below the height of your shoulder. Allow your wrist to extend over the edge of the surface.  Loosely holding a __________ weight, or a piece of rubber exercise band or tubing, slowly curl your hand up toward your forearm.  Hold this position for __________ seconds. Slowly lower the wrist back to the starting position in a controlled manner. Repeat __________ times. Complete this exercise __________ times per day.  STRENGTH - Ulnar Deviators  Stand with a ____________________ weight in your right / left hand, or  sit while holding a rubber exercise band or tubing, with your healthy arm supported on a table or countertop.  Move your wrist, so that your pinkie travels toward your forearm and your thumb moves away from your forearm.  Hold this position for __________ seconds and then slowly lower the wrist back to the starting position. Repeat __________ times. Complete this exercise __________ times per day STRENGTH - Radial Deviators  Stand with a ____________________ weight in your right / left hand, or sit while holding a rubber exercise band or tubing, with your injured arm supported on a table or countertop.  Raise your hand upward in front of you or pull up on the rubber tubing.  Hold this position for __________ seconds and then slowly lower the wrist back to the starting position. Repeat __________ times. Complete this exercise __________ times per day. STRENGTH - Forearm Supinators   Sit with your right / left forearm supported on a table, keeping your elbow below shoulder height. Rest your hand over the edge, palm down.  Gently grip a hammer or a soup ladle.  Without moving your elbow, slowly turn your palm and hand upward to a "thumbs-up" position.  Hold this position for __________ seconds. Slowly return to the starting position. Repeat __________ times. Complete this exercise __________ times per day.  STRENGTH - Forearm Pronators   Sit with your right / left forearm supported on a table,  keeping your elbow below shoulder height. Rest your hand over the edge, palm up.  Gently grip a hammer or a soup ladle.  Without moving your elbow, slowly turn your palm and hand upward to a "thumbs-up" position.  Hold this position for __________ seconds. Slowly return to the starting position. Repeat __________ times. Complete this exercise __________ times per day.  STRENGTH - Grip  Grasp a tennis ball, a dense sponge, or a large, rolled sock in your hand.  Squeeze as hard as you can,  without increasing any pain.  Hold this position for __________ seconds. Release your grip slowly. Repeat __________ times. Complete this exercise __________ times per day.  STRENGTH - Elbow Extensors, Isometric  Stand or sit upright, on a firm surface. Place your right / left arm so that your palm faces your stomach, and it is at the height of your waist.  Place your opposite hand on the underside of your forearm. Gently push up as your right / left arm resists. Push as hard as you can with both arms, without causing any pain or movement at your right / left elbow. Hold this stationary position for __________ seconds. Gradually release the tension in both arms. Allow your muscles to relax completely before repeating.   This information is not intended to replace advice given to you by your health care provider. Make sure you discuss any questions you have with your health care provider.   Document Released: 12/17/2005 Document Revised: 01/07/2015 Document Reviewed: 03/31/2009 Elsevier Interactive Patient Education [employer].

## 2016-01-13 NOTE — Assessment & Plan Note (Signed)
Discussed and encouraged smoking cessation. Start nicotene patches. Follow up as needed.

## 2016-05-04 ENCOUNTER — Ambulatory Visit (INDEPENDENT_AMBULATORY_CARE_PROVIDER_SITE_OTHER): Payer: 59 | Admitting: Internal Medicine

## 2016-05-04 DIAGNOSIS — R05 Cough: Secondary | ICD-10-CM

## 2016-05-04 DIAGNOSIS — R059 Cough, unspecified: Secondary | ICD-10-CM

## 2016-05-04 LAB — PULMONARY FUNCTION TEST
DL/VA % PRED: 105 %
DL/VA: 5.24 ml/min/mmHg/L
DLCO COR % PRED: 86 %
DLCO cor: 22.81 ml/min/mmHg
DLCO unc % pred: 84 %
DLCO unc: 22.23 ml/min/mmHg
FEF 25-75 POST: 3.63 L/s
FEF 25-75 Pre: 3.82 L/sec
FEF2575-%CHANGE-POST: -5 %
FEF2575-%PRED-POST: 135 %
FEF2575-%Pred-Pre: 143 %
FEV1-%CHANGE-POST: -1 %
FEV1-%PRED-POST: 107 %
FEV1-%Pred-Pre: 109 %
FEV1-Post: 2.73 L
FEV1-Pre: 2.78 L
FEV1FVC-%CHANGE-POST: 1 %
FEV1FVC-%Pred-Pre: 106 %
FEV6-%CHANGE-POST: -3 %
FEV6-%PRED-POST: 100 %
FEV6-%PRED-PRE: 103 %
FEV6-POST: 3.07 L
FEV6-PRE: 3.17 L
FEV6FVC-%Pred-Post: 103 %
FEV6FVC-%Pred-Pre: 103 %
FVC-%CHANGE-POST: -3 %
FVC-%PRED-POST: 97 %
FVC-%PRED-PRE: 101 %
FVC-POST: 3.07 L
FVC-Pre: 3.17 L
POST FEV1/FVC RATIO: 89 %
PRE FEV6/FVC RATIO: 100 %
Post FEV6/FVC ratio: 100 %
Pre FEV1/FVC ratio: 88 %
RV % PRED: 79 %
RV: 1.44 L
TLC % pred: 86 %
TLC: 4.55 L

## 2016-05-04 NOTE — Progress Notes (Signed)
PFT done today. 

## 2016-05-07 ENCOUNTER — Telehealth: Payer: Self-pay | Admitting: Family

## 2016-05-07 NOTE — Telephone Encounter (Signed)
Please inform patient that her pulmonary function tests were negative for any airway disease and to follow up if she continues to have symptoms.

## 2016-05-08 NOTE — Telephone Encounter (Signed)
Pt aware of results 

## 2016-09-06 ENCOUNTER — Other Ambulatory Visit (INDEPENDENT_AMBULATORY_CARE_PROVIDER_SITE_OTHER): Payer: 59

## 2016-09-06 ENCOUNTER — Encounter: Payer: Self-pay | Admitting: Internal Medicine

## 2016-09-06 ENCOUNTER — Ambulatory Visit (INDEPENDENT_AMBULATORY_CARE_PROVIDER_SITE_OTHER): Payer: 59 | Admitting: Internal Medicine

## 2016-09-06 VITALS — BP 118/66 | HR 97 | Temp 98.6°F | Resp 18 | Ht 65.0 in | Wt 250.0 lb

## 2016-09-06 DIAGNOSIS — Z0000 Encounter for general adult medical examination without abnormal findings: Secondary | ICD-10-CM

## 2016-09-06 DIAGNOSIS — R7989 Other specified abnormal findings of blood chemistry: Secondary | ICD-10-CM | POA: Diagnosis not present

## 2016-09-06 DIAGNOSIS — F17200 Nicotine dependence, unspecified, uncomplicated: Secondary | ICD-10-CM | POA: Diagnosis not present

## 2016-09-06 LAB — LIPID PANEL
Cholesterol: 200 mg/dL (ref 0–200)
HDL: 32.2 mg/dL — AB (ref >39.00)
NonHDL: 168.27
Total CHOL/HDL Ratio: 6
Triglycerides: 242 mg/dL — ABNORMAL HIGH (ref 0.0–149.0)
VLDL: 48.4 mg/dL — AB (ref 0.0–40.0)

## 2016-09-06 LAB — COMPREHENSIVE METABOLIC PANEL
ALK PHOS: 71 U/L (ref 39–117)
ALT: 17 U/L (ref 0–35)
AST: 11 U/L (ref 0–37)
Albumin: 3.8 g/dL (ref 3.5–5.2)
BUN: 12 mg/dL (ref 6–23)
CO2: 29 meq/L (ref 19–32)
Calcium: 8.6 mg/dL (ref 8.4–10.5)
Chloride: 106 mEq/L (ref 96–112)
Creatinine, Ser: 0.74 mg/dL (ref 0.40–1.20)
GFR: 108.05 mL/min (ref >60.00)
GLUCOSE: 115 mg/dL — AB (ref 70–99)
POTASSIUM: 3.2 meq/L — AB (ref 3.5–5.1)
Sodium: 139 mEq/L (ref 135–145)
TOTAL PROTEIN: 6.7 g/dL (ref 6.0–8.3)
Total Bilirubin: 0.4 mg/dL (ref 0.2–1.2)

## 2016-09-06 LAB — CBC
HEMATOCRIT: 38 % (ref 36.0–46.0)
Hemoglobin: 12.9 g/dL (ref 12.0–15.0)
MCHC: 34 g/dL (ref 30.0–36.0)
MCV: 87.5 fl (ref 78.0–100.0)
PLATELETS: 404 10*3/uL — AB (ref 150.0–400.0)
RBC: 4.35 Mil/uL (ref 3.87–5.11)
RDW: 12.6 % (ref 11.5–15.5)
WBC: 11.4 10*3/uL — ABNORMAL HIGH (ref 4.0–10.5)

## 2016-09-06 LAB — TSH: TSH: 0.73 u[IU]/mL (ref 0.35–4.50)

## 2016-09-06 LAB — LDL CHOLESTEROL, DIRECT: Direct LDL: 139 mg/dL

## 2016-09-06 LAB — VITAMIN B12: Vitamin B-12: 491 pg/mL (ref 211–911)

## 2016-09-06 LAB — HEMOGLOBIN A1C: HEMOGLOBIN A1C: 5.5 % (ref 4.6–6.5)

## 2016-09-06 NOTE — Progress Notes (Signed)
Pre visit review using our clinic review tool, if applicable. No additional management support is needed unless otherwise documented below in the visit note. 

## 2016-09-06 NOTE — Assessment & Plan Note (Signed)
Declines flu shot today, checking labs. Counseled on sun safety and distracted driving. Given screening recommendations. Recently had colonoscopy which was normal (repeat due in 2027).

## 2016-09-06 NOTE — Patient Instructions (Signed)
We are checking the labs today and will call you back with the results.   Work on getting into the exercise and limiting your calories to 1500 per day to work on weight loss. Keep a food diary and write everything down for the day and look up the calories.   Exercising to lose weight is recommended 4-5 times per week for 45 minutes per time.   Health Maintenance, Female Adopting a healthy lifestyle and getting preventive care can go a long way to promote health and wellness. Talk with your health care provider about what schedule of regular examinations is right for you. This is a good chance for you to check in with your provider about disease prevention and staying healthy. In between checkups, there are plenty of things you can do on your own. Experts have done a lot of research about which lifestyle changes and preventive measures are most likely to keep you healthy. Ask your health care provider for more information. WEIGHT AND DIET  Eat a healthy diet  Be sure to include plenty of vegetables, fruits, low-fat dairy products, and lean protein.  Do not eat a lot of foods high in solid fats, added sugars, or salt.  Get regular exercise. This is one of the most important things you can do for your health.  Most adults should exercise for at least 150 minutes each week. The exercise should increase your heart rate and make you sweat (moderate-intensity exercise).  Most adults should also do strengthening exercises at least twice a week. This is in addition to the moderate-intensity exercise.  Maintain a healthy weight  Body mass index (BMI) is a measurement that can be used to identify possible weight problems. It estimates body fat based on height and weight. Your health care provider can help determine your BMI and help you achieve or maintain a healthy weight.  For females 26 years of age and older:   A BMI below 18.5 is considered underweight.  A BMI of 18.5 to 24.9 is normal.  A  BMI of 25 to 29.9 is considered overweight.  A BMI of 30 and above is considered obese.  Watch levels of cholesterol and blood lipids  You should start having your blood tested for lipids and cholesterol at 47 years of age, then have this test every 5 years.  You may need to have your cholesterol levels checked more often if:  Your lipid or cholesterol levels are high.  You are older than 47 years of age.  You are at high risk for heart disease.  CANCER SCREENING   Lung Cancer  Lung cancer screening is recommended for adults 74-18 years old who are at high risk for lung cancer because of a history of smoking.  A yearly low-dose CT scan of the lungs is recommended for people who:  Currently smoke.  Have quit within the past 15 years.  Have at least a 30-pack-year history of smoking. A pack year is smoking an average of one pack of cigarettes a day for 1 year.  Yearly screening should continue until it has been 15 years since you quit.  Yearly screening should stop if you develop a health problem that would prevent you from having lung cancer treatment.  Breast Cancer  Practice breast self-awareness. This means understanding how your breasts normally appear and feel.  It also means doing regular breast self-exams. Let your health care provider know about any changes, no matter how small.  If you are in  your 20s or 30s, you should have a clinical breast exam (CBE) by a health care provider every 1-3 years as part of a regular health exam.  If you are 73 or older, have a CBE every year. Also consider having a breast X-ray (mammogram) every year.  If you have a family history of breast cancer, talk to your health care provider about genetic screening.  If you are at high risk for breast cancer, talk to your health care provider about having an MRI and a mammogram every year.  Breast cancer gene (BRCA) assessment is recommended for women who have family members with  BRCA-related cancers. BRCA-related cancers include:  Breast.  Ovarian.  Tubal.  Peritoneal cancers.  Results of the assessment will determine the need for genetic counseling and BRCA1 and BRCA2 testing. Cervical Cancer Your health care provider may recommend that you be screened regularly for cancer of the pelvic organs (ovaries, uterus, and vagina). This screening involves a pelvic examination, including checking for microscopic changes to the surface of your cervix (Pap test). You may be encouraged to have this screening done every 3 years, beginning at age 45.  For women ages 55-65, health care providers may recommend pelvic exams and Pap testing every 3 years, or they may recommend the Pap and pelvic exam, combined with testing for human papilloma virus (HPV), every 5 years. Some types of HPV increase your risk of cervical cancer. Testing for HPV may also be done on women of any age with unclear Pap test results.  Other health care providers may not recommend any screening for nonpregnant women who are considered low risk for pelvic cancer and who do not have symptoms. Ask your health care provider if a screening pelvic exam is right for you.  If you have had past treatment for cervical cancer or a condition that could lead to cancer, you need Pap tests and screening for cancer for at least 20 years after your treatment. If Pap tests have been discontinued, your risk factors (such as having a new sexual partner) need to be reassessed to determine if screening should resume. Some women have medical problems that increase the chance of getting cervical cancer. In these cases, your health care provider may recommend more frequent screening and Pap tests. Colorectal Cancer  This type of cancer can be detected and often prevented.  Routine colorectal cancer screening usually begins at 47 years of age and continues through 47 years of age.  Your health care provider may recommend screening at  an earlier age if you have risk factors for colon cancer.  Your health care provider may also recommend using home test kits to check for hidden blood in the stool.  A small camera at the end of a tube can be used to examine your colon directly (sigmoidoscopy or colonoscopy). This is done to check for the earliest forms of colorectal cancer.  Routine screening usually begins at age 78.  Direct examination of the colon should be repeated every 5-10 years through 47 years of age. However, you may need to be screened more often if early forms of precancerous polyps or small growths are found. Skin Cancer  Check your skin from head to toe regularly.  Tell your health care provider about any new moles or changes in moles, especially if there is a change in a mole's shape or color.  Also tell your health care provider if you have a mole that is larger than the size of a pencil  eraser.  Always use sunscreen. Apply sunscreen liberally and repeatedly throughout the day.  Protect yourself by wearing long sleeves, pants, a wide-brimmed hat, and sunglasses whenever you are outside. HEART DISEASE, DIABETES, AND HIGH BLOOD PRESSURE   High blood pressure causes heart disease and increases the risk of stroke. High blood pressure is more likely to develop in:  People who have blood pressure in the high end of the normal range (130-139/85-89 mm Hg).  People who are overweight or obese.  People who are African American.  If you are 18-39 years of age, have your blood pressure checked every 3-5 years. If you are 40 years of age or older, have your blood pressure checked every year. You should have your blood pressure measured twice--once when you are at a hospital or clinic, and once when you are not at a hospital or clinic. Record the average of the two measurements. To check your blood pressure when you are not at a hospital or clinic, you can use:  An automated blood pressure machine at a  pharmacy.  A home blood pressure monitor.  If you are between 55 years and 79 years old, ask your health care provider if you should take aspirin to prevent strokes.  Have regular diabetes screenings. This involves taking a blood sample to check your fasting blood sugar level.  If you are at a normal weight and have a low risk for diabetes, have this test once every three years after 47 years of age.  If you are overweight and have a high risk for diabetes, consider being tested at a younger age or more often. PREVENTING INFECTION  Hepatitis B  If you have a higher risk for hepatitis B, you should be screened for this virus. You are considered at high risk for hepatitis B if:  You were born in a country where hepatitis B is common. Ask your health care provider which countries are considered high risk.  Your parents were born in a high-risk country, and you have not been immunized against hepatitis B (hepatitis B vaccine).  You have HIV or AIDS.  You use needles to inject street drugs.  You live with someone who has hepatitis B.  You have had sex with someone who has hepatitis B.  You get hemodialysis treatment.  You take certain medicines for conditions, including cancer, organ transplantation, and autoimmune conditions. Hepatitis C  Blood testing is recommended for:  Everyone born from 1945 through 1965.  Anyone with known risk factors for hepatitis C. Sexually transmitted infections (STIs)  You should be screened for sexually transmitted infections (STIs) including gonorrhea and chlamydia if:  You are sexually active and are younger than 47 years of age.  You are older than 47 years of age and your health care provider tells you that you are at risk for this type of infection.  Your sexual activity has changed since you were last screened and you are at an increased risk for chlamydia or gonorrhea. Ask your health care provider if you are at risk.  If you do not  have HIV, but are at risk, it may be recommended that you take a prescription medicine daily to prevent HIV infection. This is called pre-exposure prophylaxis (PrEP). You are considered at risk if:  You are sexually active and do not regularly use condoms or know the HIV status of your partner(s).  You take drugs by injection.  You are sexually active with a partner who has HIV. Talk with   your health care provider about whether you are at high risk of being infected with HIV. If you choose to begin PrEP, you should first be tested for HIV. You should then be tested every 3 months for as long as you are taking PrEP.  PREGNANCY   If you are premenopausal and you may become pregnant, ask your health care provider about preconception counseling.  If you may become pregnant, take 400 to 800 micrograms (mcg) of folic acid every day.  If you want to prevent pregnancy, talk to your health care provider about birth control (contraception). OSTEOPOROSIS AND MENOPAUSE   Osteoporosis is a disease in which the bones lose minerals and strength with aging. This can result in serious bone fractures. Your risk for osteoporosis can be identified using a bone density scan.  If you are 51 years of age or older, or if you are at risk for osteoporosis and fractures, ask your health care provider if you should be screened.  Ask your health care provider whether you should take a calcium or vitamin D supplement to lower your risk for osteoporosis.  Menopause may have certain physical symptoms and risks.  Hormone replacement therapy may reduce some of these symptoms and risks. Talk to your health care provider about whether hormone replacement therapy is right for you.  HOME CARE INSTRUCTIONS   Schedule regular health, dental, and eye exams.  Stay current with your immunizations.   Do not use any tobacco products including cigarettes, chewing tobacco, or electronic cigarettes.  If you are pregnant, do not  drink alcohol.  If you are breastfeeding, limit how much and how often you drink alcohol.  Limit alcohol intake to no more than 1 drink per day for nonpregnant women. One drink equals 12 ounces of beer, 5 ounces of wine, or 1 ounces of hard liquor.  Do not use street drugs.  Do not share needles.  Ask your health care provider for help if you need support or information about quitting drugs.  Tell your health care provider if you often feel depressed.  Tell your health care provider if you have ever been abused or do not feel safe at home.   This information is not intended to replace advice given to you by your health care provider. Make sure you discuss any questions you have with your health care provider.   Document Released: 07/02/2011 Document Revised: 01/07/2015 Document Reviewed: 11/18/2013 Elsevier Interactive Patient Education [employer].

## 2016-09-06 NOTE — Progress Notes (Signed)
   Subjective:    Patient ID: Amanda Mcintosh, female    DOB: 10/27/1971, 45 y.o.   MRN: [id number]  HPI The patient is a 45 YO female coming in for wellness. No new concerns.   PMH, [facility], social history reviewed and updated.   Review of Systems  Constitutional: Negative for activity change, appetite change, fatigue, fever and unexpected weight change.  HENT: Negative.   Eyes: Negative.   Respiratory: Negative for cough, chest tightness, shortness of breath and wheezing.   Cardiovascular: Negative for chest pain, palpitations and leg swelling.  Gastrointestinal: Negative for abdominal distention, abdominal pain, constipation and diarrhea.  Musculoskeletal: Negative.   Skin: Negative.   Neurological: Negative for dizziness, seizures, weakness, light-headedness, numbness and headaches.  Psychiatric/Behavioral: Negative.       Objective:   Physical Exam  Constitutional: She is oriented to person, place, and time. She appears well-developed and well-nourished.  Overweight  HENT:  Head: Normocephalic and atraumatic.  Eyes: EOM are normal.  Neck: Normal range of motion.  Cardiovascular: Normal rate and regular rhythm.   Pulmonary/Chest: Effort normal and breath sounds normal. No respiratory distress. She has no wheezes. She has no rales.  Abdominal: Soft. She exhibits no distension. There is no tenderness. There is no rebound.  Neurological: She is alert and oriented to person, place, and time. Coordination normal.  Skin: Skin is warm and dry.  Psychiatric: She has a normal mood and affect.   Vitals:   09/06/16 1415  BP: 118/66  Pulse: 97  Resp: 18  Temp: 98.6 F (37 C)  TempSrc: Oral  SpO2: 94%  Weight: 250 lb (113.4 kg)  Height: 5\' 5"  (1.651 m)      Assessment & Plan:

## 2016-09-06 NOTE — Assessment & Plan Note (Signed)
Talked to her about weight loss with diet recommendations and exercise needs.

## 2016-09-06 NOTE — Assessment & Plan Note (Signed)
Counseled and she is not ready to quit at this time. Reminded her about the risks and harms from smoking.

## 2017-01-25 DIAGNOSIS — J302 Other seasonal allergic rhinitis: Secondary | ICD-10-CM | POA: Diagnosis not present

## 2017-01-25 DIAGNOSIS — J329 Chronic sinusitis, unspecified: Secondary | ICD-10-CM | POA: Diagnosis not present

## 2017-06-06 DIAGNOSIS — Z01419 Encounter for gynecological examination (general) (routine) without abnormal findings: Secondary | ICD-10-CM | POA: Diagnosis not present

## 2017-07-04 DIAGNOSIS — J329 Chronic sinusitis, unspecified: Secondary | ICD-10-CM | POA: Diagnosis not present

## 2017-07-04 DIAGNOSIS — R42 Dizziness and giddiness: Secondary | ICD-10-CM | POA: Insufficient documentation

## 2017-07-08 ENCOUNTER — Other Ambulatory Visit (INDEPENDENT_AMBULATORY_CARE_PROVIDER_SITE_OTHER): Payer: 59

## 2017-07-08 ENCOUNTER — Ambulatory Visit (INDEPENDENT_AMBULATORY_CARE_PROVIDER_SITE_OTHER): Payer: 59 | Admitting: Family Medicine

## 2017-07-08 ENCOUNTER — Encounter: Payer: Self-pay | Admitting: Family Medicine

## 2017-07-08 VITALS — BP 124/70 | HR 78 | Temp 98.5°F | Resp 14 | Ht 65.0 in | Wt 248.0 lb

## 2017-07-08 DIAGNOSIS — Z6841 Body Mass Index (BMI) 40.0 and over, adult: Secondary | ICD-10-CM

## 2017-07-08 DIAGNOSIS — F17200 Nicotine dependence, unspecified, uncomplicated: Secondary | ICD-10-CM | POA: Diagnosis not present

## 2017-07-08 DIAGNOSIS — F418 Other specified anxiety disorders: Secondary | ICD-10-CM

## 2017-07-08 DIAGNOSIS — L0231 Cutaneous abscess of buttock: Secondary | ICD-10-CM

## 2017-07-08 LAB — CBC
HEMATOCRIT: 40 % (ref 36.0–46.0)
Hemoglobin: 13.6 g/dL (ref 12.0–15.0)
MCHC: 33.9 g/dL (ref 30.0–36.0)
MCV: 87.6 fl (ref 78.0–100.0)
Platelets: 393 10*3/uL (ref 150.0–400.0)
RBC: 4.57 Mil/uL (ref 3.87–5.11)
RDW: 12.5 % (ref 11.5–15.5)
WBC: 10.1 10*3/uL (ref 4.0–10.5)

## 2017-07-08 LAB — COMPREHENSIVE METABOLIC PANEL WITH GFR
ALT: 19 U/L (ref 0–35)
AST: 13 U/L (ref 0–37)
Albumin: 3.9 g/dL (ref 3.5–5.2)
Alkaline Phosphatase: 83 U/L (ref 39–117)
BUN: 11 mg/dL (ref 6–23)
CO2: 28 meq/L (ref 19–32)
Calcium: 9.2 mg/dL (ref 8.4–10.5)
Chloride: 106 meq/L (ref 96–112)
Creatinine, Ser: 0.76 mg/dL (ref 0.40–1.20)
GFR: 104.4 mL/min
Glucose, Bld: 108 mg/dL — ABNORMAL HIGH (ref 70–99)
Potassium: 3.9 meq/L (ref 3.5–5.1)
Sodium: 138 meq/L (ref 135–145)
Total Bilirubin: 0.2 mg/dL (ref 0.2–1.2)
Total Protein: 6.9 g/dL (ref 6.0–8.3)

## 2017-07-08 LAB — TSH: TSH: 0.98 u[IU]/mL (ref 0.35–4.50)

## 2017-07-08 LAB — HEMOGLOBIN A1C: Hgb A1c MFr Bld: 5.4 % (ref 4.6–6.5)

## 2017-07-08 LAB — VITAMIN D 25 HYDROXY (VIT D DEFICIENCY, FRACTURES): VITD: 21.28 ng/mL — ABNORMAL LOW (ref 30.00–100.00)

## 2017-07-08 MED ORDER — ALPRAZOLAM 0.25 MG PO TABS: 0.2500 mg | ORAL_TABLET | Freq: Two times a day (BID) | ORAL | 0 refills | Status: DC | PRN

## 2017-07-08 NOTE — Patient Instructions (Addendum)
Website-  www.Fatlossfoodies.com  Look at HIIT- high intensity interval training  Set reasonable goals for weight loss   Drink enough water to make your urine light yellow   Mediterranean Diet A Mediterranean diet refers to food and lifestyle choices that are based on the traditions of countries located on the [employer]. This way of eating has been shown to help prevent certain conditions and improve outcomes for people who have chronic diseases, like kidney disease and heart disease. What are tips for following this plan? Lifestyle  Cook and eat meals together with your family, when possible.  Drink enough fluid to keep your urine clear or pale yellow.  Be physically active every day. This includes: ? Aerobic exercise like running or swimming. ? Leisure activities like gardening, walking, or housework.  Get 7-8 hours of sleep each night.  If recommended by your health care provider, drink red wine in moderation. This means 1 glass a day for nonpregnant women and 2 glasses a day for men. A glass of wine equals 5 oz (150 mL). Reading food labels  Check the serving size of packaged foods. For foods such as rice and pasta, the serving size refers to the amount of cooked product, not dry.  Check the total fat in packaged foods. Avoid foods that have saturated fat or trans fats.  Check the ingredients list for added sugars, such as corn syrup. Shopping  At the grocery store, buy most of your food from the areas near the walls of the store. This includes: ? Fresh fruits and vegetables (produce). ? Grains, beans, nuts, and seeds. Some of these may be available in unpackaged forms or large amounts (in bulk). ? Fresh seafood. ? Poultry and eggs. ? Low-fat dairy products.  Buy whole ingredients instead of prepackaged foods.  Buy fresh fruits and vegetables in-season from local farmers markets.  Buy frozen fruits and vegetables in resealable bags.  If you do not have  access to quality fresh seafood, buy precooked frozen shrimp or canned fish, such as tuna, salmon, or sardines.  Buy small amounts of raw or cooked vegetables, salads, or olives from the deli or salad bar at your store.  Stock your pantry so you always have certain foods on hand, such as olive oil, canned tuna, canned tomatoes, rice, pasta, and beans. Cooking  Cook foods with extra-virgin olive oil instead of using butter or other vegetable oils.  Have meat as a side dish, and have vegetables or grains as your main dish. This means having meat in small portions or adding small amounts of meat to foods like pasta or stew.  Use beans or vegetables instead of meat in common dishes like chili or lasagna.  Experiment with different cooking methods. Try roasting or broiling vegetables instead of steaming or sauteing them.  Add frozen vegetables to soups, stews, pasta, or rice.  Add nuts or seeds for added healthy fat at each meal. You can add these to yogurt, salads, or vegetable dishes.  Marinate fish or vegetables using olive oil, lemon juice, garlic, and fresh herbs. Meal planning  Plan to eat 1 vegetarian meal one day each week. Try to work up to 2 vegetarian meals, if possible.  Eat seafood 2 or more times a week.  Have healthy snacks readily available, such as: ? Vegetable sticks with hummus. ? [place] yogurt. ? Fruit and nut trail mix.  Eat balanced meals throughout the week. This includes: ? Fruit: 2-3 servings a day ? Vegetables: 4-5 servings  a day ? Low-fat dairy: 2 servings a day ? Fish, poultry, or lean meat: 1 serving a day ? Beans and legumes: 2 or more servings a week ? Nuts and seeds: 1-2 servings a day ? Whole grains: 6-8 servings a day ? Extra-virgin olive oil: 3-4 servings a day  Limit red meat and sweets to only a few servings a month What are my food choices?  Mediterranean diet ? Recommended ? Grains: Whole-grain pasta. Brown rice. Bulgar wheat. Polenta.  Couscous. Whole-wheat bread. [place]. ? Vegetables: Artichokes. Beets. Broccoli. Cabbage. Carrots. Eggplant. Green beans. Chard. Kale. Spinach. Onions. Leeks. Peas. Squash. Tomatoes. Peppers. Radishes. ? Fruits: Apples. Apricots. Avocado. Berries. Bananas. Cherries. Dates. Figs. Grapes. Lemons. Melon. Oranges. Peaches. Plums. Pomegranate. ? Meats and other protein foods: Beans. Almonds. Sunflower seeds. Pine nuts. Peanuts. [place]. Salmon. Scallops. Shrimp. [place]. Tilapia. Clams. Oysters. Eggs. ? Dairy: Low-fat milk. Cheese. Greek yogurt. ? Beverages: Water. Red wine. Herbal tea. ? Fats and oils: Extra virgin olive oil. Avocado oil. Grape seed oil. ? Sweets and desserts: [place] yogurt with honey. Baked apples. Poached pears. Trail mix. ? Seasoning and other foods: Basil. Cilantro. Coriander. Cumin. Mint. Parsley. Sage. Rosemary. Tarragon. Garlic. Oregano. Thyme. Pepper. Balsalmic vinegar. Tahini. Hummus. Tomato sauce. Olives. Mushrooms. ? Limit these ? Grains: Prepackaged pasta or rice dishes. Prepackaged cereal with added sugar. ? Vegetables: Deep fried potatoes (french fries). ? Fruits: Fruit canned in syrup. ? Meats and other protein foods: Beef. Pork. Lamb. Poultry with skin. Hot dogs. [place] Salines. ? Dairy: Ice cream. Sour cream. Whole milk. ? Beverages: Juice. Sugar-sweetened soft drinks. Beer. Liquor and spirits. ? Fats and oils: Butter. Canola oil. Vegetable oil. Beef fat (tallow). Lard. ? Sweets and desserts: Cookies. Cakes. Pies. Candy. ? Seasoning and other foods: Mayonnaise. Premade sauces and marinades. ? The items listed may not be a complete list. Talk with your dietitian about what dietary choices are right for you. Summary  The Mediterranean diet includes both food and lifestyle choices.  Eat a variety of fresh fruits and vegetables, beans, nuts, seeds, and whole grains.  Limit the amount of red meat and sweets that you eat.  Talk with your health care provider about whether  it is safe for you to drink red wine in moderation. This means 1 glass a day for nonpregnant women and 2 glasses a day for men. A glass of wine equals 5 oz (150 mL). This information is not intended to replace advice given to you by your health care provider. Make sure you discuss any questions you have with your health care provider. Document Released: 08/09/2016 Document Revised: 09/11/2016 Document Reviewed: 08/09/2016 Elsevier Interactive Patient Education  [employer].

## 2017-07-08 NOTE — Progress Notes (Signed)
Subjective:    Patient ID: Amanda Mcintosh, female    DOB: [birth year], 46 y.o.   MRN: [id number]  HPI This is a 46 yo female who presents today with concern for elevated BP. She has had some recent lightheadedness and was seen at her ENT and orthostatic blood pressures were taken and running 140s/90s. Lightheadedness improved.  Has an area of swelling and pain at the top of her buttocks, applied neosporyn and H2O2 with some relief.  Had some recent night sweats. No menses since 1/18. Saw her gyn two weeks ago and had an ultrasound that showed ovarian cysts.  Her mother passed away about 1.5 years ago, was 48 yo. Had diabetes, CAD, HTN.   Takes pantoprazole as needed, hasn't needed for several months.   Would like to stop smoking, doesn't feel like she is there yet with motivation to quit. Stress related to work, increased anxiety. Afraid to fly over last several years. Requests something for upcoming flight to [place] to take a cruise. She has been taking the train and avoiding flying for several years. This has really been an issue with her and her husband and they enjoy traveling.   Obesity- has been increasing exercise, working out at gym, cardio and weights. Feels like she has tried every diet and can never lose weight. Has been eating healthier, more lean protein and vegetables. Discarded all the soda in her house.   Past Medical History:  Diagnosis Date  . Fibroids   . History of headache   . Obesity   . Vitamin D deficiency    Past Surgical History:  Procedure Laterality Date  . CHOLECYSTECTOMY  2006  . ROBOT ASSISTED MYOMECTOMY  2011  . TONSILLECTOMY    . WISDOM TOOTH EXTRACTION     Family History  Problem Relation Age of Onset  . Diabetes Mother   . Hypertension Mother   . Heart failure Mother   . Stroke Mother   . Breast cancer Sister   . Skin cancer Sister   . COPD Father        smoker  . HIV/AIDS Brother    Social History  Substance Use Topics  .  Smoking status: Current Every Day Smoker    Packs/day: 0.30    Types: Cigarettes  . Smokeless tobacco: Never Used  . Alcohol use No     Review of Systems Per [facility]    Objective:   Physical Exam  Constitutional: She is oriented to person, place, and time. She appears well-developed and well-nourished.  Morbidly obese.   Eyes: Conjunctivae are normal.  Cardiovascular: Normal rate.   Pulmonary/Chest: Effort normal.  Neurological: She is alert and oriented to person, place, and time.  Skin: Skin is warm and dry.     Psychiatric: She has a normal mood and affect. Her behavior is normal. Judgment and thought content normal.  Vitals reviewed.     BP 124/70 (BP Location: Left Arm, Patient Position: Sitting, Cuff Size: Large)   Pulse 78   Temp 98.5 F (36.9 C) (Oral)   Resp 14   Ht 5\' 5"  (1.651 m)   Wt 248 lb (112.5 kg)   LMP 01/08/2017 (Approximate)   BMI 41.27 kg/m  Wt Readings from Last 3 Encounters:  07/08/17 248 lb (112.5 kg)  09/06/16 250 lb (113.4 kg)  01/13/16 244 lb (110.7 kg)      Assessment & Plan:  1. Class 3 severe obesity due to excess calories without serious comorbidity with body  mass index (BMI) of 40.0 to 44.9 in adult [facility]) - discussed barriers to regular exercise and making healthy food choices, patient has been making some recent changes in her diet and I encouraged her to continue, we discussed meal planning and goal weight loss of 1/2-1 pound per week - CBC; Future - Comprehensive metabolic panel; Future - TSH; Future - VITAMIN D 25 Hydroxy (Vit-D Deficiency, Fractures); Future - Hemoglobin A1c; Future - follow up in 6 months  2. Situational anxiety - discussed potential for dependence and encouraged her to use sparingly for flying - ALPRAZolam (XANAX) 0.25 MG tablet; Take 1 tablet (0.25 mg total) by mouth 2 (two) times daily as needed for anxiety.  Dispense: 20 tablet; Refill: 0  3. Abscess of gluteal cleft - improving, warm compresses prn, RTC  if increased swelling, redness or pain  4. Tobacco use disorder - patient not ready to quit, encouraged her to continue to consider and pick a stop date   Clarene Reamer, FNP-BC  [facility] [place], [place]  07/10/2017 8:24 AM

## 2017-07-10 DIAGNOSIS — Z1231 Encounter for screening mammogram for malignant neoplasm of breast: Secondary | ICD-10-CM | POA: Diagnosis not present

## 2017-07-10 DIAGNOSIS — Z803 Family history of malignant neoplasm of breast: Secondary | ICD-10-CM | POA: Diagnosis not present

## 2017-07-15 DIAGNOSIS — N6324 Unspecified lump in the left breast, lower inner quadrant: Secondary | ICD-10-CM | POA: Diagnosis not present

## 2017-07-15 DIAGNOSIS — R922 Inconclusive mammogram: Secondary | ICD-10-CM | POA: Diagnosis not present

## 2017-09-10 ENCOUNTER — Encounter: Payer: 59 | Admitting: Internal Medicine

## 2017-12-19 DIAGNOSIS — N6324 Unspecified lump in the left breast, lower inner quadrant: Secondary | ICD-10-CM | POA: Diagnosis not present

## 2018-03-26 DIAGNOSIS — R5383 Other fatigue: Secondary | ICD-10-CM | POA: Diagnosis not present

## 2018-03-26 DIAGNOSIS — E538 Deficiency of other specified B group vitamins: Secondary | ICD-10-CM | POA: Diagnosis not present

## 2018-03-26 DIAGNOSIS — N951 Menopausal and female climacteric states: Secondary | ICD-10-CM | POA: Diagnosis not present

## 2018-04-20 ENCOUNTER — Other Ambulatory Visit: Payer: Self-pay

## 2018-04-20 ENCOUNTER — Ambulatory Visit (HOSPITAL_COMMUNITY)
Admission: EM | Admit: 2018-04-20 | Discharge: 2018-04-20 | Disposition: A | Discharge status: Home / Self Care | Payer: 59 | Attending: Urgent Care | Admitting: Urgent Care

## 2018-04-20 ENCOUNTER — Encounter (HOSPITAL_COMMUNITY): Payer: Self-pay

## 2018-04-20 DIAGNOSIS — R0602 Shortness of breath: Secondary | ICD-10-CM

## 2018-04-20 DIAGNOSIS — R0789 Other chest pain: Secondary | ICD-10-CM

## 2018-04-20 DIAGNOSIS — B9789 Other viral agents as the cause of diseases classified elsewhere: Secondary | ICD-10-CM

## 2018-04-20 DIAGNOSIS — J069 Acute upper respiratory infection, unspecified: Secondary | ICD-10-CM | POA: Diagnosis not present

## 2018-04-20 DIAGNOSIS — R059 Cough, unspecified: Secondary | ICD-10-CM

## 2018-04-20 DIAGNOSIS — R062 Wheezing: Secondary | ICD-10-CM

## 2018-04-20 DIAGNOSIS — R05 Cough: Secondary | ICD-10-CM

## 2018-04-20 MED ORDER — HYDROCODONE-HOMATROPINE 5-1.5 MG/5ML PO SYRP: 5.0000 mL | ORAL_SOLUTION | Freq: Every evening | ORAL | 0 refills | Status: DC | PRN

## 2018-04-20 MED ORDER — PREDNISONE 20 MG PO TABS: ORAL_TABLET | ORAL | 0 refills | Status: DC

## 2018-04-20 MED ORDER — BENZONATATE 100 MG PO CAPS: 100.0000 mg | ORAL_CAPSULE | Freq: Three times a day (TID) | ORAL | 0 refills | Status: DC | PRN

## 2018-04-20 NOTE — ED Provider Notes (Addendum)
  MRN: [id number] DOB: May 02, 1971  Subjective:   Amanda Mcintosh is a 47 y.o. female presenting for 3-day history of productive cough that is eliciting chest pain, shortness of breath, wheezing, nausea, gagging.  Patient is also felt sinus congestion, sore throat from all her coughing fits.  She had leftover amoxicillin from a previous prescription and took a few doses of this.  She has a prescription for an albuterol inhaler which she just filled but has not started this.  She also does not take her allergy medicine.  She is an active smoker.  No current facility-administered medications for this encounter.   Current Outpatient Medications:  .  pantoprazole (PROTONIX) 40 MG tablet, Take 1 tablet (40 mg total) by mouth daily., Disp: 30 tablet, Rfl: 3 .  ALPRAZolam (XANAX) 0.25 MG tablet, Take 1 tablet (0.25 mg total) by mouth 2 (two) times daily as needed for anxiety., Disp: 20 tablet, Rfl: 0   Allergies  Allergen Reactions  . Gadolinium Derivatives Other (See Comments)    PT STARTED SHAKING AND FELT VERY COLD INTERNALLY BUT FACE WAS VERY FLUSHED. THIS WAS A DELAYED REACTION (ABOUT 1 HOUR AFTER INJECTION). SHE TOOK BENADRYL AND HYDRATED AND SYMPTOMS SUBSIDED    Past Medical History:  Diagnosis Date  . Fibroids   . History of headache   . Obesity   . Vitamin D deficiency      Past Surgical History:  Procedure Laterality Date  . CHOLECYSTECTOMY  2006  . ROBOT ASSISTED MYOMECTOMY  2011  . TONSILLECTOMY    . WISDOM TOOTH EXTRACTION      Objective:   Vitals: BP 123/62 (BP Location: Left Arm)   Pulse 79   Temp 98.2 F (36.8 C) (Oral)   Resp 17   SpO2 99%   Physical Exam  Constitutional: She is oriented to person, place, and time. She appears well-developed and well-nourished.  HENT:  Throat with significant postnasal drainage.  There is mucosal edema, rhinorrhea.  No sinus tenderness.  Eyes: Right eye exhibits no discharge. Left eye exhibits no discharge. No scleral  icterus.  Cardiovascular: Normal rate, regular rhythm and intact distal pulses. Exam reveals no gallop and no friction rub.  No murmur heard. Pulmonary/Chest: No stridor. No respiratory distress. She has wheezes. She has no rales.  Diminished lung sounds throughout.  Neurological: She is alert and oriented to person, place, and time.  Skin: Skin is warm and dry.  Psychiatric: She has a normal mood and affect.    Assessment and Plan :   Viral URI with cough  Cough  Chest tightness  Shortness of breath  Wheezing  Due to diffuse wheezing and diminished lung sounds we will use short steroid course of prednisone, schedule albuterol inhaler.  Cough suppression medications offered to patient. Counseled patient on potential for adverse effects with medications prescribed today, patient verbalized understanding. Return-to-clinic precautions discussed, patient verbalized understanding.    Jaynee Eagles, [place] 04/20/18 1835

## 2018-04-20 NOTE — ED Triage Notes (Signed)
Patient presents to [facility] for sinus and rib pain, pt complains of ache and tightness in chest due to coughing, pt also complains of nausea and lightheadedness x3 days, pt has taken OTC medications but has no relief , and an old medication she had left over (amoxicillian)

## 2018-04-20 NOTE — Discharge Instructions (Addendum)
Hydrate well with at least 2 liters (1 gallon) of water daily. For sore throat try using a honey-based tea. Use 3 teaspoons of honey with juice squeezed from half lemon. Place shaved pieces of ginger into 1/2-1 cup of water and warm over stove top. Then mix the ingredients and repeat every 4 hours as needed. Use Zyrtec and Claritin to address any allergy component. Schedule your albuterol inhaler once every 4-6 hours.

## 2018-08-20 LAB — HM MAMMOGRAPHY

## 2018-09-15 ENCOUNTER — Other Ambulatory Visit: Payer: Self-pay | Admitting: Family

## 2018-09-15 ENCOUNTER — Ambulatory Visit (INDEPENDENT_AMBULATORY_CARE_PROVIDER_SITE_OTHER)
Admission: RE | Admit: 2018-09-15 | Discharge: 2018-09-15 | Disposition: A | Discharge status: Home / Self Care | Payer: 59 | Source: Ambulatory Visit | Attending: Family | Admitting: Family

## 2018-09-15 ENCOUNTER — Encounter: Payer: Self-pay | Admitting: Family

## 2018-09-15 ENCOUNTER — Ambulatory Visit (INDEPENDENT_AMBULATORY_CARE_PROVIDER_SITE_OTHER): Payer: 59 | Admitting: Family

## 2018-09-15 VITALS — BP 146/86 | HR 83 | Temp 98.1°F | Ht 65.0 in | Wt 239.1 lb

## 2018-09-15 DIAGNOSIS — K219 Gastro-esophageal reflux disease without esophagitis: Secondary | ICD-10-CM | POA: Diagnosis not present

## 2018-09-15 DIAGNOSIS — F40243 Fear of flying: Secondary | ICD-10-CM

## 2018-09-15 DIAGNOSIS — F17200 Nicotine dependence, unspecified, uncomplicated: Secondary | ICD-10-CM

## 2018-09-15 DIAGNOSIS — J209 Acute bronchitis, unspecified: Secondary | ICD-10-CM | POA: Diagnosis not present

## 2018-09-15 DIAGNOSIS — R062 Wheezing: Secondary | ICD-10-CM

## 2018-09-15 DIAGNOSIS — R030 Elevated blood-pressure reading, without diagnosis of hypertension: Secondary | ICD-10-CM

## 2018-09-15 MED ORDER — PREDNISONE 20 MG PO TABS: 20.0000 mg | ORAL_TABLET | Freq: Every day | ORAL | 0 refills | Status: DC

## 2018-09-15 MED ORDER — AMOXICILLIN-POT CLAVULANATE 875-125 MG PO TABS: 1.0000 | ORAL_TABLET | Freq: Two times a day (BID) | ORAL | 0 refills | Status: DC

## 2018-09-15 MED ORDER — PANTOPRAZOLE SODIUM 40 MG PO TBEC: 40.0000 mg | DELAYED_RELEASE_TABLET | Freq: Every day | ORAL | 1 refills | Status: DC

## 2018-09-15 MED ORDER — ALPRAZOLAM 0.25 MG PO TABS: 0.2500 mg | ORAL_TABLET | Freq: Every day | ORAL | 0 refills | Status: DC | PRN

## 2018-09-15 NOTE — Patient Instructions (Signed)
Schedule for CPE when you get back from your cruise please.

## 2018-09-15 NOTE — Progress Notes (Signed)
Amanda Mcintosh is a 47 y.o. female with the following history as recorded in EpicCare:  Patient Active Problem List   Diagnosis Date Noted  . Dizziness 07/04/2017  . Chronic seasonal allergic rhinitis 01/25/2017  . Chronic sinusitis 01/25/2017  . Lateral epicondylitis 01/13/2016  . Amenorrhea 06/28/2015  . Situational syncope 05/07/2015  . GERD (gastroesophageal reflux disease) 04/21/2015  . Routine general medical examination at a health care facility 04/21/2015  . Unspecified vitamin D deficiency 12/02/2013  . Morbid obesity ([place]) 12/02/2013  . Iron deficiency anemia 12/02/2013  . Tobacco use disorder 12/02/2013  . Migraine without aura, with intractable migraine, so stated, without mention of status migrainosus 03/11/2013  . Family history of early CAD 09/08/2012    Current Outpatient Medications  Medication Sig Dispense Refill  . estradiol (VIVELLE-DOT) 0.025 MG/24HR APPLY A NEW PATCH TWICE A WEEK TO ROTATING SITES ON LOWER ABDOMEN  0  . fluticasone (FLONASE) 50 MCG/ACT nasal spray 2 sprays by Each Nare route daily.    . pantoprazole (PROTONIX) 40 MG tablet Take 1 tablet (40 mg total) by mouth daily. 30 tablet 1  . progesterone (PROMETRIUM) 100 MG capsule TAKE 2 CAPSULES NIGHTLY  3  . ALPRAZolam (XANAX) 0.25 MG tablet Take 1 tablet (0.25 mg total) by mouth daily as needed (flying). 10 tablet 0  . amoxicillin-clavulanate (AUGMENTIN) 875-125 MG tablet Take 1 tablet by mouth 2 (two) times daily. 20 tablet 0  . predniSONE (DELTASONE) 20 MG tablet Take 1 tablet (20 mg total) by mouth daily with breakfast. 5 tablet 0   No current facility-administered medications for this visit.     Allergies: Gadolinium derivatives  Past Medical History:  Diagnosis Date  . Fibroids   . History of headache   . Obesity   . Vitamin D deficiency     Past Surgical History:  Procedure Laterality Date  . CHOLECYSTECTOMY  2006  . ROBOT ASSISTED MYOMECTOMY  2011  . TONSILLECTOMY    . WISDOM  TOOTH EXTRACTION      Family History  Problem Relation Age of Onset  . Diabetes Mother   . Hypertension Mother   . Heart failure Mother   . Stroke Mother   . Breast cancer Sister   . Skin cancer Sister   . COPD Father        smoker  . HIV/AIDS Brother     Social History   Tobacco Use  . Smoking status: Current Every Day Smoker    Packs/day: 0.30    Types: Cigarettes  . Smokeless tobacco: Never Used  Substance Use Topics  . Alcohol use: No    Subjective:  Patient presents with concerns for fatigue/ anxiety and worsening cough/ congestion; has lost 4 family members in the past 2 weeks; admits she is overwhelmed/ anxious;  Started with cold symptoms last Thursday; notes that her chest is feeling tight- "has heard herself wheezing." Feeling dizzy/ light-headed today; Does have old prescription for ProAir- has not been using today; + smoker; sees ENT regularly for chronic sinus infections- notes that she always gets sick in the spring/ fall; has prescription for Flonase but not using regularly; Requesting refill on her Protonix for GERD; Will be going on cruise on Thursday of this week- requesting work note for remainder of the week;     Objective:  Vitals:   09/15/18 1521  BP: (!) 146/86  Pulse: 83  Temp: 98.1 F (36.7 C)  TempSrc: Oral  SpO2: 96%  Weight: 239 lb 1.3 oz (  108.4 kg)  Height: 5\' 5"  (1.651 m)    General: Well developed, well nourished, in no acute distress  Skin : Warm and dry.  Head: Normocephalic and atraumatic  Eyes: Sclera and conjunctiva clear; pupils round and reactive to light; extraocular movements intact  Ears: External normal; canals clear; tympanic membranes normal  Oropharynx: Pink, supple. No suspicious lesions  Neck: Supple without thyromegaly, adenopathy  Lungs: Respirations unlabored; after nebulizer, clear to auscultation bilaterally without wheeze, rales, rhonchi  CVS exam: normal rate and regular rhythm.  Neurologic: Alert and oriented;  speech intact; face symmetrical; moves all extremities well; CNII-XII intact without focal deficit   Assessment:  1. Acute bronchitis, unspecified organism   2. Wheezing   3. Gastroesophageal reflux disease, esophagitis presence not specified   4. Tobacco use disorder   5. Anxiety with flying   6. Elevated blood pressure reading     Plan:  Nebulizer given with relief; update CXR; Rx for Augmentin and prednisone; use Flonase; needs to quit smoking; increase fluids, rest; Refill on Xanax and Protonix as requested; Needs to follow up for CPE- suspect needs blood pressure medication; will address when she returns from cruise.   No follow-ups on file.  Orders Placed This Encounter  Procedures  . DG Chest 2 View    Standing Status:   Future    Number of Occurrences:   1    Standing Expiration Date:   11/16/2019    Order Specific Question:   Reason for Exam (SYMPTOM  OR DIAGNOSIS REQUIRED)    Answer:   wheezing    Order Specific Question:   Is patient pregnant?    Answer:   No    Order Specific Question:   Preferred imaging location?    Answer:   [facility]    Order Specific Question:   Radiology Contrast Protocol - do NOT remove file path    Answer:   \\[email or web address]    Requested Prescriptions   Signed Prescriptions Disp Refills  . pantoprazole (PROTONIX) 40 MG tablet 30 tablet 1    Sig: Take 1 tablet (40 mg total) by mouth daily.  Marland Kitchen amoxicillin-clavulanate (AUGMENTIN) 875-125 MG tablet 20 tablet 0    Sig: Take 1 tablet by mouth 2 (two) times daily.  . predniSONE (DELTASONE) 20 MG tablet 5 tablet 0    Sig: Take 1 tablet (20 mg total) by mouth daily with breakfast.  . ALPRAZolam (XANAX) 0.25 MG tablet 10 tablet 0    Sig: Take 1 tablet (0.25 mg total) by mouth daily as needed (flying).

## 2018-09-16 ENCOUNTER — Other Ambulatory Visit: Payer: Self-pay | Admitting: Family

## 2018-09-16 DIAGNOSIS — R05 Cough: Secondary | ICD-10-CM

## 2018-09-16 DIAGNOSIS — R059 Cough, unspecified: Secondary | ICD-10-CM

## 2018-09-16 MED ORDER — HYDROCOD POLST-CPM POLST ER 10-8 MG/5ML PO SUER: 5.0000 mL | Freq: Every evening | ORAL | 0 refills | Status: DC | PRN

## 2018-09-17 ENCOUNTER — Other Ambulatory Visit: Payer: 59

## 2018-09-17 ENCOUNTER — Ambulatory Visit (INDEPENDENT_AMBULATORY_CARE_PROVIDER_SITE_OTHER): Payer: 59 | Admitting: Licensed Clinical Social Worker

## 2018-09-17 DIAGNOSIS — R05 Cough: Secondary | ICD-10-CM

## 2018-09-17 DIAGNOSIS — R059 Cough, unspecified: Secondary | ICD-10-CM

## 2018-09-17 DIAGNOSIS — F321 Major depressive disorder, single episode, moderate: Secondary | ICD-10-CM | POA: Diagnosis not present

## 2018-09-18 LAB — D-DIMER, QUANTITATIVE (NOT AT ARMC): D DIMER QUANT: 0.31 ug{FEU}/mL (ref <0.50)

## 2018-09-19 ENCOUNTER — Telehealth: Payer: Self-pay | Admitting: Family

## 2018-09-19 NOTE — Telephone Encounter (Signed)
Copied from [place] [id number]. Topic: Quick Communication - See Telephone Encounter >> Sep 19, 2018  3:46 PM Margot Ables wrote: CRM for notification. See Telephone encounter for: 09/19/18. Pt calling for lab results from 09/17/18. Please call to advise. Call back # [phone]. Ok to leave detailed msg as pt is traveling.

## 2018-09-19 NOTE — Telephone Encounter (Signed)
Left message for patient with results

## 2018-09-19 NOTE — Telephone Encounter (Signed)
The d-dimer is normal; have fun on the cruise. Please follow up when she gets back.

## 2018-10-01 ENCOUNTER — Ambulatory Visit (INDEPENDENT_AMBULATORY_CARE_PROVIDER_SITE_OTHER)
Admission: RE | Admit: 2018-10-01 | Discharge: 2018-10-01 | Disposition: A | Discharge status: Home / Self Care | Payer: 59 | Source: Ambulatory Visit | Attending: Family | Admitting: Family

## 2018-10-01 ENCOUNTER — Ambulatory Visit (INDEPENDENT_AMBULATORY_CARE_PROVIDER_SITE_OTHER): Payer: 59 | Admitting: Family

## 2018-10-01 ENCOUNTER — Telehealth: Payer: Self-pay

## 2018-10-01 ENCOUNTER — Other Ambulatory Visit (INDEPENDENT_AMBULATORY_CARE_PROVIDER_SITE_OTHER): Payer: 59

## 2018-10-01 ENCOUNTER — Encounter: Payer: Self-pay | Admitting: Family

## 2018-10-01 ENCOUNTER — Other Ambulatory Visit: Payer: Self-pay | Admitting: Family

## 2018-10-01 VITALS — BP 140/78 | HR 89 | Temp 98.8°F | Ht 65.0 in | Wt 244.2 lb

## 2018-10-01 DIAGNOSIS — Z0000 Encounter for general adult medical examination without abnormal findings: Secondary | ICD-10-CM

## 2018-10-01 DIAGNOSIS — R05 Cough: Secondary | ICD-10-CM | POA: Diagnosis not present

## 2018-10-01 DIAGNOSIS — R5383 Other fatigue: Secondary | ICD-10-CM | POA: Diagnosis not present

## 2018-10-01 DIAGNOSIS — R059 Cough, unspecified: Secondary | ICD-10-CM

## 2018-10-01 DIAGNOSIS — E0590 Thyrotoxicosis, unspecified without thyrotoxic crisis or storm: Secondary | ICD-10-CM

## 2018-10-01 DIAGNOSIS — Z13220 Encounter for screening for lipoid disorders: Secondary | ICD-10-CM

## 2018-10-01 DIAGNOSIS — R030 Elevated blood-pressure reading, without diagnosis of hypertension: Secondary | ICD-10-CM

## 2018-10-01 DIAGNOSIS — F419 Anxiety disorder, unspecified: Secondary | ICD-10-CM

## 2018-10-01 DIAGNOSIS — F329 Major depressive disorder, single episode, unspecified: Secondary | ICD-10-CM

## 2018-10-01 DIAGNOSIS — F32A Depression, unspecified: Secondary | ICD-10-CM

## 2018-10-01 LAB — LIPID PANEL
CHOLESTEROL: 204 mg/dL — AB (ref 0–200)
HDL: 29.8 mg/dL — ABNORMAL LOW (ref >39.00)
LDL Cholesterol: 135 mg/dL — ABNORMAL HIGH (ref 0–99)
NonHDL: 174.36
TRIGLYCERIDES: 199 mg/dL — AB (ref 0.0–149.0)
Total CHOL/HDL Ratio: 7
VLDL: 39.8 mg/dL (ref 0.0–40.0)

## 2018-10-01 LAB — CBC WITH DIFFERENTIAL/PLATELET
BASOS ABS: 0.1 10*3/uL (ref 0.0–0.1)
Basophils Relative: 0.6 % (ref 0.0–3.0)
Eosinophils Absolute: 0.2 10*3/uL (ref 0.0–0.7)
Eosinophils Relative: 1.7 % (ref 0.0–5.0)
HEMATOCRIT: 41 % (ref 36.0–46.0)
Hemoglobin: 13.6 g/dL (ref 12.0–15.0)
LYMPHS ABS: 3.2 10*3/uL (ref 0.7–4.0)
LYMPHS PCT: 31.3 % (ref 12.0–46.0)
MCHC: 33.1 g/dL (ref 30.0–36.0)
MCV: 86.6 fl (ref 78.0–100.0)
MONOS PCT: 7.3 % (ref 3.0–12.0)
Monocytes Absolute: 0.7 10*3/uL (ref 0.1–1.0)
Neutro Abs: 6.1 10*3/uL (ref 1.4–7.7)
Neutrophils Relative %: 59.1 % (ref 43.0–77.0)
Platelets: 383 10*3/uL (ref 150.0–400.0)
RBC: 4.74 Mil/uL (ref 3.87–5.11)
RDW: 12.8 % (ref 11.5–15.5)
WBC: 10.3 10*3/uL (ref 4.0–10.5)

## 2018-10-01 LAB — COMPREHENSIVE METABOLIC PANEL
ALBUMIN: 3.9 g/dL (ref 3.5–5.2)
ALK PHOS: 74 U/L (ref 39–117)
ALT: 21 U/L (ref 0–35)
AST: 13 U/L (ref 0–37)
BILIRUBIN TOTAL: 0.5 mg/dL (ref 0.2–1.2)
BUN: 14 mg/dL (ref 6–23)
CALCIUM: 9.1 mg/dL (ref 8.4–10.5)
CO2: 26 mEq/L (ref 19–32)
Chloride: 106 mEq/L (ref 96–112)
Creatinine, Ser: 0.76 mg/dL (ref 0.40–1.20)
GFR: 103.87 mL/min (ref >60.00)
Glucose, Bld: 103 mg/dL — ABNORMAL HIGH (ref 70–99)
Potassium: 3.9 mEq/L (ref 3.5–5.1)
Sodium: 139 mEq/L (ref 135–145)
TOTAL PROTEIN: 7 g/dL (ref 6.0–8.3)

## 2018-10-01 LAB — TSH: TSH: 0.01 u[IU]/mL — AB (ref 0.35–4.50)

## 2018-10-01 LAB — VITAMIN D 25 HYDROXY (VIT D DEFICIENCY, FRACTURES): VITD: 65.35 ng/mL (ref 30.00–100.00)

## 2018-10-01 MED ORDER — ESCITALOPRAM OXALATE 10 MG PO TABS: 10.0000 mg | ORAL_TABLET | Freq: Every day | ORAL | 1 refills | Status: DC

## 2018-10-01 NOTE — Progress Notes (Signed)
Amanda Mcintosh is a 47 y.o. female with the following history as recorded in EpicCare:  Patient Active Problem List   Diagnosis Date Noted  . Dizziness 07/04/2017  . Chronic seasonal allergic rhinitis 01/25/2017  . Chronic sinusitis 01/25/2017  . Lateral epicondylitis 01/13/2016  . Amenorrhea 06/28/2015  . Situational syncope 05/07/2015  . GERD (gastroesophageal reflux disease) 04/21/2015  . Routine general medical examination at a health care facility 04/21/2015  . Unspecified vitamin D deficiency 12/02/2013  . Morbid obesity ([place]) 12/02/2013  . Iron deficiency anemia 12/02/2013  . Tobacco use disorder 12/02/2013  . Migraine without aura, with intractable migraine, so stated, without mention of status migrainosus 03/11/2013  . Family history of early CAD 09/08/2012    Current Outpatient Medications  Medication Sig Dispense Refill  . ALPRAZolam (XANAX) 0.25 MG tablet Take 1 tablet (0.25 mg total) by mouth daily as needed (flying). 10 tablet 0  . estradiol (VIVELLE-DOT) 0.025 MG/24HR APPLY A NEW PATCH TWICE A WEEK TO ROTATING SITES ON LOWER ABDOMEN  0  . fluticasone (FLONASE) 50 MCG/ACT nasal spray 2 sprays by Each Nare route daily.    . pantoprazole (PROTONIX) 40 MG tablet Take 1 tablet (40 mg total) by mouth daily. 30 tablet 1  . progesterone (PROMETRIUM) 100 MG capsule TAKE 2 CAPSULES NIGHTLY  3  . escitalopram (LEXAPRO) 10 MG tablet Take 1 tablet (10 mg total) by mouth daily. 30 tablet 1   No current facility-administered medications for this visit.     Allergies: Gadolinium derivatives  Past Medical History:  Diagnosis Date  . Fibroids   . History of headache   . Obesity   . Vitamin D deficiency     Past Surgical History:  Procedure Laterality Date  . CHOLECYSTECTOMY  2006  . ROBOT ASSISTED MYOMECTOMY  2011  . TONSILLECTOMY    . WISDOM TOOTH EXTRACTION      Family History  Problem Relation Age of Onset  . Diabetes Mother   . Hypertension Mother   . Heart  failure Mother   . Stroke Mother   . Breast cancer Sister   . Skin cancer Sister   . COPD Father        smoker  . HIV/AIDS Brother     Social History   Tobacco Use  . Smoking status: Current Every Day Smoker    Packs/day: 0.30    Types: Cigarettes  . Smokeless tobacco: Never Used  Substance Use Topics  . Alcohol use: No    Subjective:  Patient presents for her yearly CPE: wants to discuss her anxiety/ situational today- has started working with counselor. Notes that she just doesn't feel well- "can't focus at work/ just overwhelmed by all the recent personal losses she has experienced. Per patient, her counselor feels like she is severely depressed; hesitant to start medication; has opted to file for short-term disability- wants to stay out of work through early November; has also decided that she wants to file for disability;   Per patient, sees GYN; had pap smear in 2019; Mammogram done at [facility] 07/2018;  Defers flu shot; Colonoscopy due next year;  Sees eye doctor and dentist;  Review of Systems  Constitutional: Positive for malaise/fatigue.  HENT: Negative for hearing loss.   Eyes: Negative for blurred vision.  Respiratory: Positive for cough. Negative for shortness of breath.   Cardiovascular: Negative for chest pain.  Gastrointestinal: Negative for abdominal pain, constipation and diarrhea.  Genitourinary: Negative.   Musculoskeletal: Negative for myalgias.  Skin: Negative for rash.  Neurological: Positive for headaches.  Psychiatric/Behavioral: Positive for depression. The patient is nervous/anxious and has insomnia.        Objective:  Vitals:   10/01/18 0958  BP: 140/78  Pulse: 89  Temp: 98.8 F (37.1 C)  TempSrc: Oral  SpO2: 96%  Weight: 244 lb 3.2 oz (110.8 kg)  Height: '5\' 5"'  (1.651 m)    General: Well developed, well nourished, in no acute distress  Skin : Warm and dry.  Head: Normocephalic and atraumatic  Eyes: Sclera and conjunctiva clear;  pupils round and reactive to light; extraocular movements intact  Ears: External normal; canals clear; tympanic membranes normal  Oropharynx: Pink, supple. No suspicious lesions  Neck: Supple without thyromegaly, adenopathy  Lungs: Respirations unlabored; clear to auscultation bilaterally without wheeze, rales, rhonchi  CVS exam: normal rate and regular rhythm.  Abdomen: Soft; nontender; nondistended; normoactive bowel sounds; no masses or hepatosplenomegaly  Musculoskeletal: No deformities; no active joint inflammation  Extremities: No edema, cyanosis, clubbing  Vessels: Symmetric bilaterally  Neurologic: Alert and oriented; speech intact; face symmetrical; moves all extremities well; CNII-XII intact without focal deficit   Assessment:  1. PE (physical exam), annual   2. Lipid screening   3. Other fatigue   4. Cough   5. Anxiety and depression   6. Elevated blood pressure reading     Plan:  Age appropriate preventive healthcare needs addressed; encouraged regular eye doctor and dental exams; encouraged regular exercise; stressed need to quit smoking;  will update labs and refills as needed today; follow-up to be determined;  Majority of visit was spent discussing patient's symptoms of fatigue/ anxiety and depression/ plans for short-term disability/ FMLA; scheduled to see her therapist again tomorrow; agrees to trial of Lexapro 10 mg- risks and benefits discussed; will update labs to make sure not missing underlying metabolic condition; ? If elevated blood pressure due to anxiety; follow-up in 1 month, sooner prn.   Return in about 30 days (around 10/31/2018).  Orders Placed This Encounter  Procedures  . DG Chest 2 View    Standing Status:   Future    Number of Occurrences:   1    Standing Expiration Date:   12/02/2019    Order Specific Question:   Reason for Exam (SYMPTOM  OR DIAGNOSIS REQUIRED)    Answer:   cough    Order Specific Question:   Is patient pregnant?    Answer:   No     Order Specific Question:   Preferred imaging location?    Answer:   [facility]    Order Specific Question:   Radiology Contrast Protocol - do NOT remove file path    Answer:   \\[email or web address]  . CBC w/Diff    Standing Status:   Future    Number of Occurrences:   1    Standing Expiration Date:   10/01/2019  . Comp Met (CMET)    Standing Status:   Future    Number of Occurrences:   1    Standing Expiration Date:   10/01/2019  . Lipid panel    Standing Status:   Future    Number of Occurrences:   1    Standing Expiration Date:   10/02/2019  . Vitamin D (25 hydroxy)    Standing Status:   Future    Number of Occurrences:   1    Standing Expiration Date:   10/01/2019  . TSH    Standing Status:  Future    Number of Occurrences:   1    Standing Expiration Date:   10/01/2019    Requested Prescriptions   Signed Prescriptions Disp Refills  . escitalopram (LEXAPRO) 10 MG tablet 30 tablet 1    Sig: Take 1 tablet (10 mg total) by mouth daily.

## 2018-10-01 NOTE — Telephone Encounter (Signed)
Call [place] and get copy of mammagram

## 2018-10-02 ENCOUNTER — Ambulatory Visit (INDEPENDENT_AMBULATORY_CARE_PROVIDER_SITE_OTHER): Payer: 59 | Admitting: Licensed Clinical Social Worker

## 2018-10-02 ENCOUNTER — Other Ambulatory Visit: Payer: 59

## 2018-10-02 DIAGNOSIS — F321 Major depressive disorder, single episode, moderate: Secondary | ICD-10-CM | POA: Diagnosis not present

## 2018-10-02 DIAGNOSIS — E0590 Thyrotoxicosis, unspecified without thyrotoxic crisis or storm: Secondary | ICD-10-CM

## 2018-10-02 NOTE — Telephone Encounter (Signed)
Called today and spoke with [place]. Results being faxed over today.  Mammo done 08/20/18

## 2018-10-03 ENCOUNTER — Encounter: Payer: 59 | Admitting: Family

## 2018-10-03 ENCOUNTER — Other Ambulatory Visit: Payer: Self-pay | Admitting: Family

## 2018-10-03 DIAGNOSIS — E0590 Thyrotoxicosis, unspecified without thyrotoxic crisis or storm: Secondary | ICD-10-CM

## 2018-10-03 LAB — THYROID PROFILE - CHCC
FREE THYROXINE INDEX: 2.9 (ref 1.4–3.8)
T3 Uptake: 31 % (ref 22–35)
T4, Total: 9.5 ug/dL (ref 5.1–11.9)

## 2018-10-07 ENCOUNTER — Ambulatory Visit (INDEPENDENT_AMBULATORY_CARE_PROVIDER_SITE_OTHER): Payer: 59 | Admitting: Licensed Clinical Social Worker

## 2018-10-07 ENCOUNTER — Encounter: Payer: Self-pay | Admitting: Family

## 2018-10-07 DIAGNOSIS — F321 Major depressive disorder, single episode, moderate: Secondary | ICD-10-CM | POA: Diagnosis not present

## 2018-10-07 NOTE — Progress Notes (Signed)
Outside notes received. Information abstracted. Notes sent to scan.  

## 2018-10-15 ENCOUNTER — Ambulatory Visit: Payer: 59 | Admitting: Licensed Clinical Social Worker

## 2018-10-22 ENCOUNTER — Ambulatory Visit (INDEPENDENT_AMBULATORY_CARE_PROVIDER_SITE_OTHER): Payer: 59 | Admitting: Licensed Clinical Social Worker

## 2018-10-22 DIAGNOSIS — F321 Major depressive disorder, single episode, moderate: Secondary | ICD-10-CM

## 2018-10-30 ENCOUNTER — Ambulatory Visit: Payer: 59 | Admitting: Licensed Clinical Social Worker

## 2018-10-31 ENCOUNTER — Other Ambulatory Visit (INDEPENDENT_AMBULATORY_CARE_PROVIDER_SITE_OTHER): Payer: 59

## 2018-10-31 ENCOUNTER — Ambulatory Visit (INDEPENDENT_AMBULATORY_CARE_PROVIDER_SITE_OTHER): Payer: 59 | Admitting: Family

## 2018-10-31 ENCOUNTER — Telehealth: Payer: Self-pay

## 2018-10-31 ENCOUNTER — Encounter: Payer: Self-pay | Admitting: Family

## 2018-10-31 VITALS — BP 142/80 | HR 66 | Temp 97.6°F | Ht 65.0 in | Wt 247.0 lb

## 2018-10-31 DIAGNOSIS — I10 Essential (primary) hypertension: Secondary | ICD-10-CM

## 2018-10-31 DIAGNOSIS — F419 Anxiety disorder, unspecified: Secondary | ICD-10-CM

## 2018-10-31 DIAGNOSIS — E0590 Thyrotoxicosis, unspecified without thyrotoxic crisis or storm: Secondary | ICD-10-CM

## 2018-10-31 DIAGNOSIS — R079 Chest pain, unspecified: Secondary | ICD-10-CM

## 2018-10-31 DIAGNOSIS — R7989 Other specified abnormal findings of blood chemistry: Secondary | ICD-10-CM

## 2018-10-31 LAB — TSH: TSH: 0.01 u[IU]/mL — ABNORMAL LOW (ref 0.35–4.50)

## 2018-10-31 MED ORDER — HYDROCHLOROTHIAZIDE 25 MG PO TABS: 25.0000 mg | ORAL_TABLET | Freq: Every day | ORAL | 0 refills | Status: DC

## 2018-10-31 NOTE — Progress Notes (Signed)
Amanda Mcintosh is a 47 y.o. female with the following history as recorded in EpicCare:  Patient Active Problem List   Diagnosis Date Noted  . Dizziness 07/04/2017  . Chronic seasonal allergic rhinitis 01/25/2017  . Chronic sinusitis 01/25/2017  . Lateral epicondylitis 01/13/2016  . Amenorrhea 06/28/2015  . Situational syncope 05/07/2015  . GERD (gastroesophageal reflux disease) 04/21/2015  . Routine general medical examination at a health care facility 04/21/2015  . Unspecified vitamin D deficiency 12/02/2013  . Morbid obesity ([place]) 12/02/2013  . Iron deficiency anemia 12/02/2013  . Tobacco use disorder 12/02/2013  . Migraine without aura, with intractable migraine, so stated, without mention of status migrainosus 03/11/2013  . Family history of early CAD 09/08/2012    Current Outpatient Medications  Medication Sig Dispense Refill  . escitalopram (LEXAPRO) 10 MG tablet Take 1 tablet (10 mg total) by mouth daily. 30 tablet 1  . estradiol (VIVELLE-DOT) 0.025 MG/24HR APPLY A NEW PATCH TWICE A WEEK TO ROTATING SITES ON LOWER ABDOMEN  0  . fluticasone (FLONASE) 50 MCG/ACT nasal spray 2 sprays by Each Nare route daily.    . pantoprazole (PROTONIX) 40 MG tablet Take 1 tablet (40 mg total) by mouth daily. 30 tablet 1  . progesterone (PROMETRIUM) 100 MG capsule TAKE 2 CAPSULES NIGHTLY  3  . hydrochlorothiazide (HYDRODIURIL) 25 MG tablet Take 1 tablet (25 mg total) by mouth daily. 90 tablet 0   No current facility-administered medications for this visit.     Allergies: Gadolinium derivatives  Past Medical History:  Diagnosis Date  . Fibroids   . History of headache   . Obesity   . Vitamin D deficiency     Past Surgical History:  Procedure Laterality Date  . CHOLECYSTECTOMY  2006  . ROBOT ASSISTED MYOMECTOMY  2011  . TONSILLECTOMY    . WISDOM TOOTH EXTRACTION      Family History  Problem Relation Age of Onset  . Diabetes Mother   . Hypertension Mother   . Heart failure  Mother   . Stroke Mother   . Breast cancer Sister   . Skin cancer Sister   . COPD Father        smoker  . HIV/AIDS Brother     Social History   Tobacco Use  . Smoking status: Current Every Day Smoker    Packs/day: 0.30    Types: Cigarettes  . Smokeless tobacco: Never Used  Substance Use Topics  . Alcohol use: No    Subjective:  Patient presents for one month follow-up on anxiety/ recent start of Lexapro; feels like she is starting to feel more calm/ not concerned for side effects; needs paperwork completed to allow her to return to work; continuing to work with counselor; admits still not sleeping well. Admits to having a recent episode of chest pain- not exertional; no dizziness, no shortness of breath;   Does also mention today that she has an integrative provider in [place] who has been managing her thyroid with "[facility]'s Thyroid." Is currently taking daily; has also been getting estrogen and testosterone cream;    Objective:  Vitals:   10/31/18 1109  BP: (!) 142/80  Pulse: 66  Temp: 97.6 F (36.4 C)  TempSrc: Oral  SpO2: 99%  Weight: 247 lb 0.6 oz (112.1 kg)  Height: 5\' 5"  (1.651 m)    General: Well developed, well nourished, in no acute distress  Skin : Warm and dry.  Head: Normocephalic and atraumatic  Eyes: Sclera and conjunctiva clear; pupils  round and reactive to light; extraocular movements intact  Ears: External normal; canals clear; tympanic membranes normal  Oropharynx: Pink, supple. No suspicious lesions  Neck: Supple without thyromegaly, adenopathy  Lungs: Respirations unlabored; clear to auscultation bilaterally without wheeze, rales, rhonchi  CVS exam: normal rate and regular rhythm.  Neurologic: Alert and oriented; speech intact; face symmetrical; moves all extremities well; CNII-XII intact without focal deficit   Assessment:  1. Anxiety   2. Chest pain, unspecified type   3. Essential hypertension   4. Morbid obesity ([place])   5. Abnormal TSH      Plan:  1. Improving; continue Lexapro; continue working with counselor; return to work as scheduled on Monday; follow-up in 3 months, sooner prn. 2. & 3. Update EKG today- no acute changes compared to EKG done in 2016; trial of HCTZ 25 mg daily; 4. Refer to weight loss program through [facility]; stressed need to quit smoking; 5. Re-check TSH today; she notes she is already having her thryoid managed by her integrative medical provider; follow-up to be determined.   Return in about 3 months (around 01/31/2019).  Orders Placed This Encounter  Procedures  . Amb Ref to Medical Weight Management    Referral Priority:   Routine    Referral Type:   Consultation    Referred to Provider:   Starlyn Skeans, MD    Number of Visits Requested:   1  . EKG 12-Lead    Requested Prescriptions   Signed Prescriptions Disp Refills  . hydrochlorothiazide (HYDRODIURIL) 25 MG tablet 90 tablet 0    Sig: Take 1 tablet (25 mg total) by mouth daily.

## 2018-11-03 ENCOUNTER — Other Ambulatory Visit: Payer: Self-pay | Admitting: Family

## 2018-11-03 DIAGNOSIS — E039 Hypothyroidism, unspecified: Secondary | ICD-10-CM

## 2018-11-03 DIAGNOSIS — E0590 Thyrotoxicosis, unspecified without thyrotoxic crisis or storm: Secondary | ICD-10-CM

## 2018-11-03 MED ORDER — PANTOPRAZOLE SODIUM 40 MG PO TBEC: 40.0000 mg | DELAYED_RELEASE_TABLET | Freq: Every day | ORAL | 0 refills | Status: DC

## 2018-11-03 NOTE — Telephone Encounter (Signed)
I did the referral but she needs to get copies of recent labs with her integrative provider and take whatever thyroid supplement they have been giving her to the endocrinologist appointment.

## 2018-11-05 ENCOUNTER — Ambulatory Visit (INDEPENDENT_AMBULATORY_CARE_PROVIDER_SITE_OTHER): Payer: 59 | Admitting: Licensed Clinical Social Worker

## 2018-11-05 DIAGNOSIS — F324 Major depressive disorder, single episode, in partial remission: Secondary | ICD-10-CM

## 2018-11-06 NOTE — Telephone Encounter (Signed)
Called and left message for patient.

## 2018-11-12 ENCOUNTER — Ambulatory Visit (INDEPENDENT_AMBULATORY_CARE_PROVIDER_SITE_OTHER): Payer: 59 | Admitting: Licensed Clinical Social Worker

## 2018-11-12 DIAGNOSIS — F321 Major depressive disorder, single episode, moderate: Secondary | ICD-10-CM | POA: Diagnosis not present

## 2018-12-10 ENCOUNTER — Ambulatory Visit: Payer: Self-pay | Admitting: Internal Medicine

## 2018-12-16 NOTE — Progress Notes (Signed)
Name: Amanda Mcintosh  MRN/ DOB: [id number], 07/24/1971    Age/ Sex: 47 y.o., female    PCP: Marrian Salvage, [place]   Reason for Endocrinology Evaluation: Low TSH      Date of Initial Endocrinology Evaluation: 09/47/0962     HPI: Ms. Amanda Mcintosh is a 47 y.o. female with a past medical history of GERD , anxiety and migraine headaches. The patient presented for initial endocrinology clinic visit on 12/17/2018 for consultative assistance with her low TSH .   In review of her records she was noted to have a suppressed TSH at 0.01 uIU/mL on 10/01/2018, with normal FT4,  repeat TSH showed persistent suppression on 10/31/2018.     Pt follows with integrative health for thyroid disease. She presented there with fatigue, difficulty breathing, and weight gain , she was started on one grain (60 mg ) of armour-thyroid , subsequently increased to a grain and half (90 mg), she took it from April until  August , 2019.   But then she started feeling worse with palpitations and jittery sensations and stopped it a month ago, she feels much better.    She has been postmenopausal for a year now. She typically sees ObGYN but intergrative health had her on estrogen and progesterone as well as testosterone.  Pt did not feel any better on the cocktail of hormones that was prescribed by [facility], on the contrary she started noticing excessive growth of thick dark hair on the chin, and beard area.     No FH of thyroid disease or autommune disease.    HISTORY:  Past Medical History:  Past Medical History:  Diagnosis Date  . Fibroids   . History of headache   . Obesity   . Vitamin D deficiency    Past Surgical History:  Past Surgical History:  Procedure Laterality Date  . CHOLECYSTECTOMY  2006  . ROBOT ASSISTED MYOMECTOMY  2011  . TONSILLECTOMY    . WISDOM TOOTH EXTRACTION        Social History:  reports that she has been smoking cigarettes. She has been smoking  about 0.30 packs per day. She has never used smokeless tobacco. She reports that she does not drink alcohol or use drugs.  Family History: family history includes Breast cancer in her sister; COPD in her father; Diabetes in her mother; HIV/AIDS in her brother; Heart failure in her mother; Hypertension in her mother; Skin cancer in her sister; Stroke in her mother.   HOME MEDICATIONS: Current Outpatient Medications on File Prior to Visit  Medication Sig Dispense Refill  . hydrochlorothiazide (HYDRODIURIL) 25 MG tablet Take 1 tablet (25 mg total) by mouth daily. 90 tablet 0  . pantoprazole (PROTONIX) 40 MG tablet Take 1 tablet (40 mg total) by mouth daily. 90 tablet 0  . escitalopram (LEXAPRO) 10 MG tablet Take 1 tablet (10 mg total) by mouth daily. (Patient not taking: Reported on 12/17/2018) 30 tablet 1  . estradiol (VIVELLE-DOT) 0.025 MG/24HR APPLY A NEW PATCH TWICE A WEEK TO ROTATING SITES ON LOWER ABDOMEN  0  . fluticasone (FLONASE) 50 MCG/ACT nasal spray 2 sprays by Each Nare route daily.    . progesterone (PROMETRIUM) 100 MG capsule TAKE 2 CAPSULES NIGHTLY  3   No current facility-administered medications on file prior to visit.       REVIEW OF SYSTEMS: A comprehensive ROS was conducted with the patient and is negative except as per HPI and below:  Review of  Systems  Constitutional: Positive for malaise/fatigue. Negative for weight loss.  HENT: Negative for congestion and sore throat.   Eyes: Negative for blurred vision and pain.  Respiratory: Negative for cough and shortness of breath.   Cardiovascular: Positive for palpitations. Negative for chest pain.       Resolved once she stopped all hormones  Gastrointestinal: Negative for diarrhea and nausea.  Genitourinary: Negative for frequency.  Skin: Negative for itching and rash.       Noted Hair loss and excessive growth of hair on the face   Neurological: Negative for tingling and tremors.  Endo/Heme/Allergies: Negative for  polydipsia.  Psychiatric/Behavioral: Negative for depression. The patient is not nervous/anxious.        OBJECTIVE:  VS: BP 138/62 (BP Location: Right Arm, Patient Position: Sitting, Cuff Size: Large)   Pulse 71   Ht 5\' 5"  (1.651 m)   Wt 254 lb (115.2 kg)   LMP 01/08/2017 (Approximate)   SpO2 98%   BMI 42.27 kg/m    Wt Readings from Last 3 Encounters:  12/17/18 254 lb (115.2 kg)  10/31/18 247 lb 0.6 oz (112.1 kg)  10/01/18 244 lb 3.2 oz (110.8 kg)     EXAM: General: Pt appears well and is in NAD  Hydration: Well-hydrated with moist mucous membranes and good skin turgor  Eyes: External eye exam normal without stare, lid lag or exophthalmos.  EOM intact.  PERRL.  Ears, Nose, Throat: Hearing: Grossly intact bilaterally Dental: Good dentition  Throat: Clear without mass, erythema or exudate  Neck: General: Supple without adenopathy. Thyroid: Thyroid size normal.  No goiter or nodules appreciated. No thyroid bruit.  Lungs: Clear with good BS bilat with no rales, rhonchi, or wheezes  Heart: Auscultation: RRR.  Abdomen: Normoactive bowel sounds, soft, nontender, without masses or organomegaly palpable  Extremities: BL LE: No pretibial edema normal ROM and strength.  Skin: Hair:Dark, thick hair noted on the chin, cheek and under chin areas.  Skin Inspection: No rashes. Skin Palpation: Skin temperature, texture, and thickness normal to palpation  Neuro: Cranial nerves: II - XII grossly intact  Motor: Normal strength throughout DTRs: 2+ and symmetric in UE without delay in relaxation phase  Mental Status: Judgment, insight: Intact Orientation: Oriented to time, place, and person Mood and affect: No depression, anxiety, or agitation     DATA REVIEWED:   Results for Amanda Mcintosh, Amanda Mcintosh (MRN [id number]) as of 12/18/2018 09:37  Ref. Range 04/20/2015 09:08 09/06/2016 14:58 07/08/2017 16:22 10/01/2018 10:49 10/02/2018 11:31 10/31/2018 12:04 12/17/2018 14:01  TSH Latest Ref Range: 0.35 -  4.50 uIU/mL 0.92 0.73 0.98 0.01 (L)  0.01 (L) 1.47  Triiodothyronine (T3) Latest Ref Range: 76 - 181 ng/dL       116  T4,Free(Direct) Latest Ref Range: 0.60 - 1.60 ng/dL       0.54 (L)  Thyroxine (T4) Latest Ref Range: 5.1 - 11.9 mcg/dL     9.5    Free Thyroxine Index Latest Ref Range: 1.4 - 3.8      2.9    T3 Uptake Latest Ref Range: 22 - 35 %     31      ASSESSMENT/PLAN/RECOMMENDATIONS:   1. Iatrogenic hypothyroidism :   - Pt did have symptoms consistent with hyperthyroidism as well as bio-chemical evidence of hyperthyroidism while on Armour thyroid.  - It is unclear what her TFT numbers were in April but its is clear that being on 90 mg of desiccated thyroid which is the equivalent to ~ 60  mcg daily of Levothyroxine is too much for her.  - I do believe that part of her scalp hair loss could also be due to testosterone therapy, as well as the excessive facial hair growth.  - The Endocrine Society does not recommend testosterone therapy in women due to lack of evidence of benefit.  - I agree with stopping all hormonal therapy as the risk outweighs the benefit at this time  - Repeat TSH shows normalization , but her FT4 is somehow a little on the low side but at this time, I would recommend repeating the results in 6-8 weeks, and seeing which way are her TFT's going to go. I would not recommend starting any LT-4 replacement unless her TSH is 10 uIU/mL or higher Pt is not on any biotin products.    2. Fatigue :  - Could be due to iatrogenic hyperthyroidism , but my concern is OSA. Given her weight, she is at high risk for it  - I will leave this to the discretion of her PCP .    3. Obesity :  - Discussed lifestyle changes, such as eating 3 consistent meals and avoiding snacks. Discussed avoiding sugar-sweetened beverages and given examples for low carbohydrate snacking  and trying to incorporate exercise.   F/U PRN     Signed electronically by: Mack Guise,  MD  [facility] Endocrinology  [facility] [address] [place], [place] [address] Phone: [phone] FAX: [phone]   CC: Marrian Salvage, [place] [address] Phone: [phone] Fax: [phone]   Return to Endocrinology clinic as below: Future Appointments  Date Time Provider [place]  01/13/2019  1:00 PM Sonda Primes, [place] [facility] None  01/28/2019  1:20 PM Marrian Salvage, [place]

## 2018-12-17 ENCOUNTER — Encounter: Payer: Self-pay | Admitting: Internal Medicine

## 2018-12-17 ENCOUNTER — Ambulatory Visit (INDEPENDENT_AMBULATORY_CARE_PROVIDER_SITE_OTHER): Payer: 59 | Admitting: Internal Medicine

## 2018-12-17 VITALS — BP 138/62 | HR 71 | Ht 65.0 in | Wt 254.0 lb

## 2018-12-17 DIAGNOSIS — R7989 Other specified abnormal findings of blood chemistry: Secondary | ICD-10-CM | POA: Diagnosis not present

## 2018-12-17 DIAGNOSIS — Z6841 Body Mass Index (BMI) 40.0 and over, adult: Secondary | ICD-10-CM

## 2018-12-17 LAB — TSH: TSH: 1.47 u[IU]/mL (ref 0.35–4.50)

## 2018-12-17 LAB — T4, FREE: FREE T4: 0.54 ng/dL — AB (ref 0.60–1.60)

## 2018-12-17 NOTE — Patient Instructions (Signed)
Your low TSH, indicates that your brain was perceiving that you have too much thyroid hormone on board. I am glad you stopped the armour thyroid  - We will check your thyroid today and will let you know if it has gotten better or not, or if we need to do any further evaluation.  - Please discuss your prior diagnosis of sleep apnea ,I do recommend you get treated for this, is you still have the condition, to improve your energy level - Your hair loss could be due to thyroid disease or that you were on testosterone.

## 2018-12-18 ENCOUNTER — Telehealth: Payer: Self-pay | Admitting: Internal Medicine

## 2018-12-18 ENCOUNTER — Encounter: Payer: Self-pay | Admitting: Internal Medicine

## 2018-12-18 LAB — T3: T3, Total: 116 ng/dL (ref 76–181)

## 2018-12-18 NOTE — Telephone Encounter (Signed)
Left a message for a call back   Abby Jaralla Kaisei Gilbo, MD   Endocrinology  [facility] [address] [place], [place] [address] Phone: [phone] FAX: [phone]  

## 2018-12-19 ENCOUNTER — Other Ambulatory Visit: Payer: Self-pay | Admitting: Internal Medicine

## 2018-12-19 DIAGNOSIS — R7989 Other specified abnormal findings of blood chemistry: Secondary | ICD-10-CM

## 2018-12-19 NOTE — Telephone Encounter (Signed)
Patient returned your call.

## 2019-01-13 ENCOUNTER — Ambulatory Visit: Payer: Self-pay | Admitting: Licensed Clinical Social Worker

## 2019-01-27 ENCOUNTER — Ambulatory Visit: Payer: Self-pay | Admitting: Licensed Clinical Social Worker

## 2019-01-28 ENCOUNTER — Encounter: Payer: Self-pay | Admitting: Family

## 2019-01-28 ENCOUNTER — Ambulatory Visit (INDEPENDENT_AMBULATORY_CARE_PROVIDER_SITE_OTHER): Payer: 59 | Admitting: Family

## 2019-01-28 VITALS — BP 136/84 | HR 98 | Temp 98.0°F | Ht 65.0 in | Wt 247.0 lb

## 2019-01-28 DIAGNOSIS — K219 Gastro-esophageal reflux disease without esophagitis: Secondary | ICD-10-CM

## 2019-01-28 DIAGNOSIS — R030 Elevated blood-pressure reading, without diagnosis of hypertension: Secondary | ICD-10-CM | POA: Diagnosis not present

## 2019-01-28 DIAGNOSIS — R0683 Snoring: Secondary | ICD-10-CM | POA: Diagnosis not present

## 2019-01-28 DIAGNOSIS — R0681 Apnea, not elsewhere classified: Secondary | ICD-10-CM

## 2019-01-28 DIAGNOSIS — Z720 Tobacco use: Secondary | ICD-10-CM

## 2019-01-28 NOTE — Progress Notes (Signed)
Amanda Mcintosh is a 48 y.o. female with the following history as recorded in EpicCare:  Patient Active Problem List   Diagnosis Date Noted  . Dizziness 07/04/2017  . Chronic seasonal allergic rhinitis 01/25/2017  . Chronic sinusitis 01/25/2017  . Lateral epicondylitis 01/13/2016  . Amenorrhea 06/28/2015  . Situational syncope 05/07/2015  . GERD (gastroesophageal reflux disease) 04/21/2015  . Routine general medical examination at a health care facility 04/21/2015  . Unspecified vitamin D deficiency 12/02/2013  . Morbid obesity ([place]) 12/02/2013  . Iron deficiency anemia 12/02/2013  . Tobacco use disorder 12/02/2013  . Migraine without aura, with intractable migraine, so stated, without mention of status migrainosus 03/11/2013  . Family history of early CAD 09/08/2012    Current Outpatient Medications  Medication Sig Dispense Refill  . fluticasone (FLONASE) 50 MCG/ACT nasal spray 2 sprays by Each Nare route daily.    . pantoprazole (PROTONIX) 40 MG tablet Take 1 tablet (40 mg total) by mouth daily. 90 tablet 0  . hydrochlorothiazide (HYDRODIURIL) 25 MG tablet Take 1 tablet (25 mg total) by mouth daily. (Patient not taking: Reported on 01/28/2019) 90 tablet 0   No current facility-administered medications for this visit.     Allergies: Gadolinium derivatives  Past Medical History:  Diagnosis Date  . Fibroids   . History of headache   . Obesity   . Vitamin D deficiency     Past Surgical History:  Procedure Laterality Date  . CHOLECYSTECTOMY  2006  . ROBOT ASSISTED MYOMECTOMY  2011  . TONSILLECTOMY    . WISDOM TOOTH EXTRACTION      Family History  Problem Relation Age of Onset  . Diabetes Mother   . Hypertension Mother   . Heart failure Mother   . Stroke Mother   . Breast cancer Sister   . Skin cancer Sister   . COPD Father        smoker  . HIV/AIDS Brother     Social History   Tobacco Use  . Smoking status: Current Every Day Smoker    Packs/day: 0.30   Types: Cigarettes  . Smokeless tobacco: Never Used  Substance Use Topics  . Alcohol use: No    Subjective:  3 month follow-up on chronic care needs; 1) Hypertension- has not taken medication in the past 2 weeks; woke up this morning with a sinus headache; is comfortable re-starting the medication if needed; 2) Would like to discuss getting set up for sleep study- known to be heavy snoring/ per patient, husband has heard her stop breathing; 3) Scheduled to see her endocrinologist regarding thyroid follow-up; 4) Knows she needs to quit smoking- "just not there yet." 5) Still using Protonix for GERD symptoms;    Objective:  Vitals:   01/28/19 1325  BP: 136/84  Pulse: 98  Temp: 98 F (36.7 C)  TempSrc: Oral  SpO2: 96%  Weight: 247 lb 0.6 oz (112.1 kg)  Height: 5\' 5"  (1.651 m)    General: Well developed, well nourished, in no acute distress  Skin : Warm and dry.  Head: Normocephalic and atraumatic  Neck: Supple without thyromegaly, adenopathy  Lungs: Respirations unlabored; clear to auscultation bilaterally without wheeze, rales, rhonchi  CVS exam: normal rate and regular rhythm.  Abdomen: Soft; nontender; nondistended; normoactive bowel sounds; no masses or hepatosplenomegaly  Musculoskeletal: No deformities; no active joint inflammation  Extremities: No edema, cyanosis, clubbing  Vessels: Symmetric bilaterally  Neurologic: Alert and oriented; speech intact; face symmetrical; moves all extremities well; CNII-XII intact  without focal deficit   Assessment:  1. Elevated blood pressure reading   2. Snoring   3. Witnessed episode of apnea   4. Gastroesophageal reflux disease, esophagitis presence not specified   5. Tobacco abuse     Plan:  1. Patient has been off medication x 2 weeks; will hold medication for now; she will start checking her pressure daily- follow-up if consistently above 140/90 and will re-start medication. 2. & 3. Refer for sleep study; 4. Stable; continue  Protonix; per patient, she had colonoscopy 2-3 year ago;  5. Stressed need to quit smoking- she is considering need to quit;   No follow-ups on file.  Orders Placed This Encounter  Procedures  . Ambulatory referral to Neurology    Referral Priority:   Routine    Referral Type:   Consultation    Referral Reason:   Specialty Services Required    Requested Specialty:   Neurology    Number of Visits Requested:   1    Requested Prescriptions    No prescriptions requested or ordered in this encounter

## 2019-01-28 NOTE — Patient Instructions (Signed)
Steps to Quit Smoking    Smoking tobacco can be bad for your health. It can also affect almost every organ in your body. Smoking puts you and people around you at risk for many serious long-lasting (chronic) diseases. Quitting smoking is hard, but it is one of the best things that you can do for your health. It is never too late to quit.  What are the benefits of quitting smoking?  When you quit smoking, you lower your risk for getting serious diseases and conditions. They can include:  · Lung cancer or lung disease.  · Heart disease.  · Stroke.  · Heart attack.  · Not being able to have children (infertility).  · Weak bones (osteoporosis) and broken bones (fractures).  If you have coughing, wheezing, and shortness of breath, those symptoms may get better when you quit. You may also get sick less often. If you are pregnant, quitting smoking can help to lower your chances of having a baby of low birth weight.  What can I do to help me quit smoking?  Talk with your doctor about what can help you quit smoking. Some things you can do (strategies) include:  · Quitting smoking totally, instead of slowly cutting back how much you smoke over a period of time.  · Going to in-person counseling. You are more likely to quit if you go to many counseling sessions.  · Using resources and support systems, such as:  ? Online chats with a counselor.  ? Phone quitlines.  ? Printed self-help materials.  ? Support groups or group counseling.  ? Text messaging programs.  ? Mobile phone apps or applications.  · Taking medicines. Some of these medicines may have nicotine in them. If you are pregnant or breastfeeding, do not take any medicines to quit smoking unless your doctor says it is okay. Talk with your doctor about counseling or other things that can help you.  Talk with your doctor about using more than one strategy at the same time, such as taking medicines while you are also going to in-person counseling. This can help make  quitting easier.  What things can I do to make it easier to quit?  Quitting smoking might feel very hard at first, but there is a lot that you can do to make it easier. Take these steps:  · Talk to your family and friends. Ask them to support and encourage you.  · Call phone quitlines, reach out to support groups, or work with a counselor.  · Ask people who smoke to not smoke around you.  · Avoid places that make you want (trigger) to smoke, such as:  ? Bars.  ? Parties.  ? Smoke-break areas at work.  · Spend time with people who do not smoke.  · Lower the stress in your life. Stress can make you want to smoke. Try these things to help your stress:  ? Getting regular exercise.  ? Deep-breathing exercises.  ? Yoga.  ? Meditating.  ? Doing a body scan. To do this, close your eyes, focus on one area of your body at a time from head to toe, and notice which parts of your body are tense. Try to relax the muscles in those areas.  · Download or buy apps on your mobile phone or tablet that can help you stick to your quit plan. There are many free apps, such as QuitGuide from the CDC (Centers for Disease Control and Prevention). You can find more   support from smokefree.gov and other websites.  This information is not intended to replace advice given to you by your health care provider. Make sure you discuss any questions you have with your health care provider.  Document Released: 10/13/2009 Document Revised: 08/14/2016 Document Reviewed: 05/03/2015  Elsevier Interactive Patient Education © 2019 Elsevier Inc.

## 2019-02-02 ENCOUNTER — Encounter (HOSPITAL_COMMUNITY): Payer: Self-pay | Admitting: Emergency Medicine

## 2019-02-02 ENCOUNTER — Emergency Department (HOSPITAL_COMMUNITY): Payer: 59

## 2019-02-02 ENCOUNTER — Other Ambulatory Visit: Payer: Self-pay

## 2019-02-02 DIAGNOSIS — R5381 Other malaise: Secondary | ICD-10-CM | POA: Diagnosis not present

## 2019-02-02 DIAGNOSIS — R509 Fever, unspecified: Secondary | ICD-10-CM | POA: Diagnosis not present

## 2019-02-02 DIAGNOSIS — F17210 Nicotine dependence, cigarettes, uncomplicated: Secondary | ICD-10-CM | POA: Insufficient documentation

## 2019-02-02 DIAGNOSIS — R531 Weakness: Secondary | ICD-10-CM | POA: Diagnosis not present

## 2019-02-02 DIAGNOSIS — Z79899 Other long term (current) drug therapy: Secondary | ICD-10-CM | POA: Diagnosis not present

## 2019-02-02 DIAGNOSIS — M7918 Myalgia, other site: Secondary | ICD-10-CM | POA: Insufficient documentation

## 2019-02-02 DIAGNOSIS — R0789 Other chest pain: Secondary | ICD-10-CM | POA: Diagnosis present

## 2019-02-02 MED ORDER — SODIUM CHLORIDE 0.9% FLUSH: 3.0000 mL | Freq: Once | INTRAVENOUS | Status: DC

## 2019-02-02 NOTE — ED Triage Notes (Signed)
Patient is complaining of mid chest pain that started around 5pm. Patient states she felt something pop and started feeling dizzy.

## 2019-02-03 ENCOUNTER — Emergency Department (HOSPITAL_COMMUNITY)
Admission: EM | Admit: 2019-02-03 | Discharge: 2019-02-03 | Disposition: A | Discharge status: Home / Self Care | Payer: 59 | Attending: Emergency Medicine | Admitting: Emergency Medicine

## 2019-02-03 DIAGNOSIS — R6889 Other general symptoms and signs: Secondary | ICD-10-CM

## 2019-02-03 LAB — POCT I-STAT TROPONIN I: Troponin i, poc: 0 ng/mL (ref 0.00–0.08)

## 2019-02-03 LAB — I-STAT BETA HCG BLOOD, ED (NOT ORDERABLE): I-stat hCG, quantitative: <5 m[IU]/mL (ref <5)

## 2019-02-03 LAB — CBC
HCT: 40.9 % (ref 36.0–46.0)
Hemoglobin: 12.7 g/dL (ref 12.0–15.0)
MCH: 28.2 pg (ref 26.0–34.0)
MCHC: 31.1 g/dL (ref 30.0–36.0)
MCV: 90.9 fL (ref 80.0–100.0)
Platelets: 344 10*3/uL (ref 150–400)
RBC: 4.5 MIL/uL (ref 3.87–5.11)
RDW: 12.4 % (ref 11.5–15.5)
WBC: 9.2 10*3/uL (ref 4.0–10.5)
nRBC: 0 % (ref 0.0–0.2)

## 2019-02-03 LAB — BASIC METABOLIC PANEL
Anion gap: 7 (ref 5–15)
BUN: 17 mg/dL (ref 6–20)
CO2: 24 mmol/L (ref 22–32)
CREATININE: 0.97 mg/dL (ref 0.44–1.00)
Calcium: 8.9 mg/dL (ref 8.9–10.3)
Chloride: 108 mmol/L (ref 98–111)
GFR calc Af Amer: >60 mL/min (ref >60)
GFR calc non Af Amer: >60 mL/min (ref >60)
Glucose, Bld: 98 mg/dL (ref 70–99)
Potassium: 3.9 mmol/L (ref 3.5–5.1)
Sodium: 139 mmol/L (ref 135–145)

## 2019-02-03 LAB — INFLUENZA PANEL BY PCR (TYPE A & B)
Influenza A By PCR: NEGATIVE
Influenza B By PCR: NEGATIVE

## 2019-02-03 MED ORDER — ACETAMINOPHEN 325 MG PO TABS
650.0000 mg | ORAL_TABLET | Freq: Once | ORAL | Status: AC
  Administered 2019-02-03: 650 mg via ORAL
  Filled 2019-02-03: qty 2

## 2019-02-03 NOTE — ED Notes (Signed)
Pt states she is having nausea, body aches, chills. She says she felt dizzy and light headed at home and called her husband to come back home. Pt states she is achy all over

## 2019-02-03 NOTE — Discharge Instructions (Signed)
Please rest and drink plenty of fluids Take Tylenol or Ibuprofen for fever Take over the counter cough and cold medicine Return if you are worsening

## 2019-02-03 NOTE — ED Provider Notes (Signed)
[facility] DEPT Provider Note   [name]: [id number] Arrival date & time: 02/02/19  2231     History   Chief Complaint Chief Complaint  Patient presents with  . Chest Pain    HPI Amanda Mcintosh is a 48 y.o. female who presents with chest pain.  She states that she was at work today and had acute onset of generalized weakness, malaise, body aches.  She also was having some chest tightness and then had pain with coughing after feeling a pop.  She felt lightheaded like she was going to pass out therefore she went home early from work.  She decided to come to the emergency department.  States that that she has not had a flu shot this year.  She reports a headache but denies runny nose or sore throat.  She denies shortness of breath.  She denies abdominal pain, vomiting, diarrhea, urinary symptoms.  HPI  Past Medical History:  Diagnosis Date  . Fibroids   . History of headache   . Obesity   . Vitamin D deficiency     Patient Active Problem List   Diagnosis Date Noted  . Dizziness 07/04/2017  . Chronic seasonal allergic rhinitis 01/25/2017  . Chronic sinusitis 01/25/2017  . Lateral epicondylitis 01/13/2016  . Amenorrhea 06/28/2015  . Situational syncope 05/07/2015  . GERD (gastroesophageal reflux disease) 04/21/2015  . Routine general medical examination at a health care facility 04/21/2015  . Unspecified vitamin D deficiency 12/02/2013  . Morbid obesity ([place]) 12/02/2013  . Iron deficiency anemia 12/02/2013  . Tobacco use disorder 12/02/2013  . Migraine without aura, with intractable migraine, so stated, without mention of status migrainosus 03/11/2013  . Family history of early CAD 09/08/2012    Past Surgical History:  Procedure Laterality Date  . CHOLECYSTECTOMY  2006  . ROBOT ASSISTED MYOMECTOMY  2011  . TONSILLECTOMY    . WISDOM TOOTH EXTRACTION       OB History   No obstetric history on file.      Home Medications    Prior to  Admission medications   Medication Sig Start Date End Date Taking? Authorizing Provider  cholecalciferol (VITAMIN D3) 25 MCG (1000 UT) tablet Take 1,000 Units by mouth daily.   Yes [provider]  Multiple Vitamin (MULTIVITAMIN WITH MINERALS) TABS tablet Take 1 tablet by mouth daily.   Yes [provider]  omega-3 acid ethyl esters (LOVAZA) 1 g capsule Take 1 g by mouth daily.   Yes [provider]  pantoprazole (PROTONIX) 40 MG tablet Take 1 tablet (40 mg total) by mouth daily. Patient taking differently: Take 40 mg by mouth daily as needed (heart burn).  11/03/18  Yes Marrian Salvage, FNP  Turmeric 500 MG TABS Take 500 mg by mouth daily.   Yes [provider]  vitamin k 100 MCG tablet Take 100 mcg by mouth daily.   Yes [provider]  hydrochlorothiazide (HYDRODIURIL) 25 MG tablet Take 1 tablet (25 mg total) by mouth daily. Patient not taking: Reported on 01/28/2019 10/31/18   Marrian Salvage, FNP    Family History Family History  Problem Relation Age of Onset  . Diabetes Mother   . Hypertension Mother   . Heart failure Mother   . Stroke Mother   . Breast cancer Sister   . Skin cancer Sister   . COPD Father        smoker  . HIV/AIDS Brother     Social History Social  History   Tobacco Use  . Smoking status: Current Every Day Smoker    Packs/day: 0.30    Types: Cigarettes  . Smokeless tobacco: Never Used  Substance Use Topics  . Alcohol use: No  . Drug use: No     Allergies   Gadolinium derivatives   Review of Systems Review of Systems  Constitutional: Positive for chills and fever.  Respiratory: Positive for cough and chest tightness. Negative for shortness of breath.   Cardiovascular: Positive for chest pain.  Gastrointestinal: Positive for nausea. Negative for abdominal pain, diarrhea and vomiting.  Musculoskeletal: Positive for arthralgias, back pain and myalgias.  All other systems reviewed and are  negative.    Physical Exam Updated Vital Signs BP (!) 161/94   Pulse 91   Temp (!) 101.9 F (38.8 C) (Oral)   Resp 19   Ht 5\' 5"  (1.651 m)   Wt 112 kg   LMP 01/08/2017 (Approximate)   SpO2 95%   BMI 41.10 kg/m   Physical Exam Vitals signs and nursing note reviewed.  Constitutional:      General: She is not in acute distress.    Appearance: She is well-developed.     Comments: Calm, cooperative.  Diaphoretic from fever breaking  HENT:     Head: Normocephalic and atraumatic.     Right Ear: Hearing, tympanic membrane, ear canal and external ear normal.     Left Ear: Hearing, tympanic membrane, ear canal and external ear normal.     Nose: Nose normal.     Mouth/Throat:     Lips: Pink.     Pharynx: Oropharynx is clear.  Eyes:     General: No scleral icterus.       Right eye: No discharge.        Left eye: No discharge.     Conjunctiva/sclera: Conjunctivae normal.     Pupils: Pupils are equal, round, and reactive to light.  Neck:     Musculoskeletal: Normal range of motion and neck supple.  Cardiovascular:     Rate and Rhythm: Normal rate and regular rhythm.  Pulmonary:     Effort: Pulmonary effort is normal. No respiratory distress.     Breath sounds: Normal breath sounds.  Abdominal:     General: There is no distension.     Palpations: Abdomen is soft.     Tenderness: There is no abdominal tenderness.  Skin:    General: Skin is warm and dry.  Neurological:     Mental Status: She is alert and oriented to person, place, and time.  Psychiatric:        Behavior: Behavior normal.      ED Treatments / Results  Labs (all labs ordered are listed, but only abnormal results are displayed) Labs Reviewed  BASIC METABOLIC PANEL  CBC  INFLUENZA PANEL BY PCR (TYPE A & B)  I-STAT TROPONIN, ED  I-STAT BETA HCG BLOOD, ED ([facility], [facility], [facility] ONLY)  POCT I-STAT TROPONIN I  I-STAT BETA HCG BLOOD, ED (NOT ORDERABLE)    EKG None  Radiology Dg Chest 2 View  Result Date:  02/03/2019 CLINICAL DATA:  Mid chest pain starting around 5 p.m. Dizziness. Heavy feeling to the upper extremities. Smoker. EXAM: CHEST - 2 VIEW COMPARISON:  10/01/2018 FINDINGS: The heart size and mediastinal contours are within normal limits. Both lungs are clear. The visualized skeletal structures are unremarkable. IMPRESSION: No active cardiopulmonary disease. Electronically Signed   By: Lucienne Capers M.D.   On: 02/03/2019 00:03  Procedures Procedures (including critical care time)  Medications Ordered in ED Medications  sodium chloride flush (NS) 0.9 % injection 3 mL (has no administration in time range)  acetaminophen (TYLENOL) tablet 650 mg (650 mg Oral Given 02/03/19 0302)     Initial Impression / Assessment and Plan / ED Course  I have reviewed the triage vital signs and the nursing notes.  Pertinent labs & imaging results that were available during my care of the patient were reviewed by me and considered in my medical decision making (see chart for details).  48 year old female with flulike illness.  She is febrile to 101.9 here.  She is hypertensive but otherwise vital signs are normal.  EKG is sinus rhythm.  CBC is normal.  BMP is normal.  Troponin obtained in triage is normal.  Flu test is negative.  Chest x-ray is negative.  IV fluids were offered to patient.  She declines and would like to be discharged home.  Recommended supportive care with rest, Tylenol, ibuprofen.  Return precautions given  Final Clinical Impressions(s) / ED Diagnoses   Final diagnoses:  Flu-like symptoms    ED Discharge Orders    None       Recardo Evangelist, PA-C 02/03/19 3754    Duffy Bruce, MD 02/03/19 [id number]

## 2019-02-04 ENCOUNTER — Telehealth: Payer: Self-pay | Admitting: Family

## 2019-02-04 ENCOUNTER — Ambulatory Visit: Payer: Self-pay | Admitting: Family

## 2019-02-04 ENCOUNTER — Other Ambulatory Visit: Payer: Self-pay | Admitting: Family

## 2019-02-04 MED ORDER — OSELTAMIVIR PHOSPHATE 75 MG PO CAPS: 75.0000 mg | ORAL_CAPSULE | Freq: Two times a day (BID) | ORAL | 0 refills | Status: DC

## 2019-02-04 MED ORDER — HYDROCOD POLST-CPM POLST ER 10-8 MG/5ML PO SUER: 5.0000 mL | Freq: Two times a day (BID) | ORAL | 0 refills | Status: DC | PRN

## 2019-02-04 NOTE — Telephone Encounter (Signed)
Please call and check on her today/ ? Need for appointment?; saw that she was in the ER yesterday with flu-like symptoms. I am sorry she is not feeling well but there is not much more I am going to offer today. If she is worried about her blood pressure, let's get her healthy and then do a follow-up.

## 2019-02-04 NOTE — Telephone Encounter (Signed)
Spoke with patient and info given 

## 2019-02-04 NOTE — Telephone Encounter (Signed)
Spoke with patient today. She is still feeling pretty miserable. I did let her know about the Tamaflu and she is fine with you phoning this in. She would also like something for cough as well. She is fine with me cancelling appointment for today as well and she will come back once she feels better.   Once you phone in script(s) I will call her back so she can have husband pick up medication(s) for her.  Thanks

## 2019-02-04 NOTE — Telephone Encounter (Signed)
Sent in Tamiflu and Tussionex for her; needs to follow-up if symptoms persist however;

## 2019-02-10 ENCOUNTER — Encounter (INDEPENDENT_AMBULATORY_CARE_PROVIDER_SITE_OTHER): Payer: Self-pay

## 2019-02-11 ENCOUNTER — Other Ambulatory Visit (INDEPENDENT_AMBULATORY_CARE_PROVIDER_SITE_OTHER): Payer: 59

## 2019-02-11 DIAGNOSIS — R7989 Other specified abnormal findings of blood chemistry: Secondary | ICD-10-CM

## 2019-02-11 LAB — T4, FREE: Free T4: 0.99 ng/dL (ref 0.60–1.60)

## 2019-02-11 LAB — TSH: TSH: 0.78 u[IU]/mL (ref 0.35–4.50)

## 2019-02-17 ENCOUNTER — Encounter (INDEPENDENT_AMBULATORY_CARE_PROVIDER_SITE_OTHER): Payer: Self-pay | Admitting: Bariatrics

## 2019-02-17 ENCOUNTER — Ambulatory Visit (INDEPENDENT_AMBULATORY_CARE_PROVIDER_SITE_OTHER): Payer: 59 | Admitting: Bariatrics

## 2019-02-17 VITALS — BP 138/90 | HR 70 | Temp 98.6°F | Ht 66.0 in | Wt 240.0 lb

## 2019-02-17 DIAGNOSIS — E559 Vitamin D deficiency, unspecified: Secondary | ICD-10-CM | POA: Diagnosis not present

## 2019-02-17 DIAGNOSIS — R7309 Other abnormal glucose: Secondary | ICD-10-CM

## 2019-02-17 DIAGNOSIS — G43809 Other migraine, not intractable, without status migrainosus: Secondary | ICD-10-CM

## 2019-02-17 DIAGNOSIS — Z1331 Encounter for screening for depression: Secondary | ICD-10-CM

## 2019-02-17 DIAGNOSIS — Z9189 Other specified personal risk factors, not elsewhere classified: Secondary | ICD-10-CM

## 2019-02-17 DIAGNOSIS — R5383 Other fatigue: Secondary | ICD-10-CM

## 2019-02-17 DIAGNOSIS — Z6838 Body mass index (BMI) 38.0-38.9, adult: Secondary | ICD-10-CM

## 2019-02-17 DIAGNOSIS — R0602 Shortness of breath: Secondary | ICD-10-CM

## 2019-02-17 DIAGNOSIS — K219 Gastro-esophageal reflux disease without esophagitis: Secondary | ICD-10-CM

## 2019-02-17 DIAGNOSIS — Z0289 Encounter for other administrative examinations: Secondary | ICD-10-CM

## 2019-02-17 NOTE — Progress Notes (Signed)
Office: [phone]  /  Fax: [phone]   Dear Marrian Salvage, FNP,   Thank you for referring Amanda Mcintosh to our clinic. The following note includes my evaluation and treatment recommendations.  HPI:   Chief Complaint: OBESITY    Amanda Mcintosh has been referred by Marrian Salvage, FNP for consultation regarding her obesity and obesity related comorbidities.    Amanda Mcintosh (MR# [id number]) is a 48 y.o. female who presents on 02/17/2019 for obesity evaluation and treatment. Current BMI is Body mass index is 38.74 kg/m.Marland Kitchen Amanda Mcintosh has been struggling with her weight for many years and has been unsuccessful in either losing weight, maintaining weight loss, or reaching her healthy weight goal.     Amanda Mcintosh attended our information session and states she is currently in the action stage of change and ready to dedicate time achieving and maintaining a healthier weight. Amanda Mcintosh is interested in becoming our patient and working on intensive lifestyle modifications including (but not limited to) diet, exercise and weight loss.    Amanda Mcintosh states her family eats meals together she thinks her family will eat healthier with  her her desired weight loss is 52 lbs she has been heavy most of  her life she started gaining weight after 28 yrs of age her heaviest weight ever was 242 lbs. she has significant food cravings issues  she does skip meals about 5 times a week she is frequently drinking liquids with calories she is not a picky eater she frequently makes poor food choices she frequently eats larger portions than normal  she has binge eating behaviors she struggles with emotional eating    Fatigue Amanda Mcintosh feels her energy is lower than it should be. This has worsened with weight gain and has not worsened recently. Amanda Mcintosh admits to daytime somnolence and he admits to waking up still tired. Patient is at risk for obstructive sleep apnea. Patent has a history of  symptoms of daytime fatigue, morning fatigue and morning headache. Patient generally gets 5 or 6 hours of sleep per night, and states they generally have restless sleep. Snoring is present. Apneic episodes are present. Epworth Sleepiness Score is 6  Dyspnea on exertion Amanda Mcintosh notes increasing shortness of breath with certain activities (climbing stairs) and seems to be worsening over time with weight gain. She notes getting out of breath sooner with activity than she used to. This has not gotten worse recently. Amanda Mcintosh denies orthopnea.  Migraines without aura Amanda Mcintosh has seen neurology and had work up. She is not on prescription medications.  Vitamin D deficiency Amanda Mcintosh has a diagnosis of vitamin D deficiency. She is currently taking OTC vit D and denies nausea, vomiting or muscle weakness.  At risk for osteopenia and osteoporosis Amanda Mcintosh is at higher risk of osteopenia and osteoporosis due to vitamin D deficiency.   GERD (gastroesophageal reflux disease) Amanda Mcintosh has a diagnosis of gastroesophageal reflux disease and she is currently taking Protonix.  Elevated Glucose Amanda Mcintosh has a history of some elevated blood glucose readings without a diagnosis of diabetes. She denies polyphagia.  Depression Screen Amanda Mcintosh Food and Mood (modified PHQ-9) score was  Depression screen [name] 2/9 02/17/2019  Decreased Interest 0  Down, Depressed, Hopeless 0  PHQ - 2 Score 0  Altered sleeping 1  Tired, decreased energy 3  Change in appetite 0  Feeling bad or failure about yourself  0  Trouble concentrating 0  Moving slowly or fidgety/restless 0  Suicidal thoughts 0  PHQ-9 Score 4  Difficult doing work/chores Not difficult at all    ASSESSMENT AND PLAN:  Other fatigue - Plan: EKG 12-Lead  Shortness of breath on exertion  Other migraine without status migrainosus, not intractable  Vitamin D deficiency - Plan: VITAMIN D 25 Hydroxy (Vit-D Deficiency, Fractures)  Gastroesophageal reflux  disease, esophagitis presence not specified  Elevated glucose - Plan: Hemoglobin A1c, Insulin, random  Depression screening  At risk for osteoporosis  Class 2 severe obesity with serious comorbidity and body mass index (BMI) of 38.0 to 38.9 in adult, unspecified obesity type (Amanda Mcintosh)  PLAN:  Fatigue Amanda Mcintosh was informed that her fatigue may be related to obesity, depression or many other causes. Labs will be ordered, and in the meanwhile Amanda Mcintosh has agreed to work on diet, exercise and weight loss to help with fatigue. Proper sleep hygiene was discussed including the need for 7-8 hours of quality sleep each night. A sleep study was not ordered based on symptoms and Epworth score.  Dyspnea on exertion Amanda Mcintosh's shortness of breath appears to be obesity related and exercise induced. She has agreed to work on weight loss and gradually increase exercise to treat her exercise induced shortness of breath. If Amanda Mcintosh follows our instructions and loses weight without improvement of her shortness of breath, we will plan to refer to pulmonology. We will monitor this condition regularly. Amanda Mcintosh agrees to this plan.  Migraines without aura Amanda Mcintosh will continue to follow up with her PCP and she will follow up with our clinic in 2 weeks.  Vitamin D Deficiency Amanda Mcintosh was informed that low vitamin D levels contributes to fatigue and are associated with obesity, breast, and colon cancer. She will continue OTC Vit D and will follow up for routine testing of vitamin D, at least 2-3 times per year. She was informed of the risk of over-replacement of vitamin D and agrees to not increase her dose unless she discusses this with [place] first. We will check vitamin D level today and Amanda Mcintosh will follow up as directed.  At risk for osteopenia and osteoporosis Amanda Mcintosh was given extended  (15 minutes) osteoporosis prevention counseling today. Amanda Mcintosh is at risk for osteopenia and osteoporosis due to her vitamin D  deficiency. She was encouraged to take her vitamin D and follow her higher calcium diet and increase strengthening exercise to help strengthen her bones and decrease her risk of osteopenia and osteoporosis.  GERD (gastroesophageal reflux disease) Amanda Mcintosh will continue to take Protonix and she will avoid triggers. She will follow up with our clinic in 2 weeks.   Elevated Glucose Fasting labs will be obtained (Hgb A1c, insulin level) and results with be discussed with Amanda Mcintosh in 2 weeks at her follow up visit. In the meanwhile Amanda Mcintosh was started on a lower simple carbohydrate diet and will work on weight loss efforts.  Depression Screen Amanda Mcintosh had a negative depression screening. Depression is commonly associated with obesity and often results in emotional eating behaviors. We will monitor this closely and work on CBT to help improve the non-hunger eating patterns.   Obesity Amanda Mcintosh is currently in the action stage of change and her goal is to continue with weight loss efforts. I recommend Amanda Mcintosh begin the structured treatment plan as follows:  She has agreed to follow the Category 2 plan Amanda Mcintosh has been instructed to eventually work up to a goal of 150 minutes of combined cardio and strengthening exercise per week for weight loss and overall health benefits. We discussed the following Behavioral Modification Strategies today: increase  H2O intake, no skipping meals, keeping healthy foods in the home, increasing lean protein intake, decreasing simple carbohydrates, increasing vegetables and work on meal planning and easy cooking plans   She was informed of the importance of frequent follow up visits to maximize her success with intensive lifestyle modifications for her multiple health conditions. She was informed we would discuss her lab results at her next visit unless there is a critical issue that needs to be addressed sooner. Amanda Mcintosh agreed to keep her next visit at the agreed upon time to  discuss these results.  ALLERGIES: Allergies  Allergen Reactions  . Gadolinium Derivatives Other (See Comments)    PT STARTED SHAKING AND FELT VERY COLD INTERNALLY BUT FACE WAS VERY FLUSHED. THIS WAS A DELAYED REACTION (ABOUT 1 HOUR AFTER INJECTION). SHE TOOK BENADRYL AND HYDRATED AND SYMPTOMS SUBSIDED    MEDICATIONS: Current Outpatient Medications on File Prior to Visit  Medication Sig Dispense Refill  . cholecalciferol (VITAMIN D3) 25 MCG (1000 UT) tablet Take 1,000 Units by mouth daily.    . hydrochlorothiazide (HYDRODIURIL) 25 MG tablet Take 1 tablet (25 mg total) by mouth daily. 90 tablet 0  . Multiple Vitamin (MULTIVITAMIN WITH MINERALS) TABS tablet Take 1 tablet by mouth daily.    . pantoprazole (PROTONIX) 40 MG tablet Take 1 tablet (40 mg total) by mouth daily. (Patient taking differently: Take 40 mg by mouth daily as needed (heart burn). ) 90 tablet 0  . Pregnenolone Micronized POWD by Does not apply route.    . Turmeric 500 MG TABS Take 500 mg by mouth daily.    . vitamin k 100 MCG tablet Take 100 mcg by mouth daily.    . Zinc 50 MG CAPS Take by mouth.    . chlorpheniramine-HYDROcodone (TUSSIONEX PENNKINETIC ER) 10-8 MG/5ML SUER Take 5 mLs by mouth every 12 (twelve) hours as needed for cough. (Patient not taking: Reported on 02/17/2019) 70 mL 0  . oseltamivir (TAMIFLU) 75 MG capsule Take 1 capsule (75 mg total) by mouth 2 (two) times daily. (Patient not taking: Reported on 02/17/2019) 10 capsule 0   No current facility-administered medications on file prior to visit.     PAST MEDICAL HISTORY: Past Medical History:  Diagnosis Date  . Fibroids   . Heartburn   . History of headache   . Obesity   . Thyroid condition   . Vitamin D deficiency     PAST SURGICAL HISTORY: Past Surgical History:  Procedure Laterality Date  . CHOLECYSTECTOMY  2006  . ROBOT ASSISTED MYOMECTOMY  2011  . TONSILLECTOMY    . WISDOM TOOTH EXTRACTION      SOCIAL HISTORY: Social History    Tobacco Use  . Smoking status: Current Every Day Smoker    Packs/day: 0.30    Types: Cigarettes  . Smokeless tobacco: Never Used  Substance Use Topics  . Alcohol use: No  . Drug use: No    FAMILY HISTORY: Family History  Problem Relation Age of Onset  . Diabetes Mother   . Hypertension Mother   . Heart failure Mother   . Stroke Mother   . High Cholesterol Mother   . Depression Mother   . Anxiety disorder Mother   . Sleep apnea Mother   . Obesity Mother   . Breast cancer Sister   . Skin cancer Sister   . COPD Father        smoker  . High blood pressure Father   . High Cholesterol Father   .  HIV/AIDS Brother     ROS: Review of Systems  Constitutional: Positive for malaise/fatigue.  HENT: Positive for sinus pain.   Respiratory: Positive for shortness of breath (with activity).   Cardiovascular: Negative for orthopnea.       + Leg Cramping  Gastrointestinal: Positive for heartburn. Negative for nausea and vomiting.  Musculoskeletal:       Negative for muscle weakness  Neurological: Positive for headaches.  Endo/Heme/Allergies:       Negative for polyphagia    PHYSICAL EXAM: Blood pressure 138/90, pulse 70, temperature 98.6 F (37 C), temperature source Oral, height 5\' 6"  (1.676 m), weight 240 lb (108.9 kg), last menstrual period 02/28/2017, SpO2 99 %. Body mass index is 38.74 kg/m. Physical Exam Vitals signs reviewed.  Constitutional:      Appearance: Normal appearance. She is well-developed. She is obese.  HENT:     Head: Normocephalic and atraumatic.     Nose: Nose normal.     Mouth/Throat:     Comments: Mallampati = 3 Eyes:     General: No scleral icterus.    Extraocular Movements: Extraocular movements intact.  Neck:     Musculoskeletal: Normal range of motion and neck supple.     Thyroid: No thyromegaly.  Cardiovascular:     Rate and Rhythm: Normal rate and regular rhythm.  Pulmonary:     Effort: Pulmonary effort is normal. No respiratory  distress.  Abdominal:     Palpations: Abdomen is soft.     Tenderness: There is no abdominal tenderness.  Musculoskeletal: Normal range of motion.     Comments: Range of Motion normal in all 4 extremities  Skin:    General: Skin is warm and dry.  Neurological:     Mental Status: She is alert and oriented to person, place, and time.     Coordination: Coordination normal.  Psychiatric:        Mood and Affect: Mood normal.        Behavior: Behavior normal.     RECENT LABS AND TESTS: BMET    Component Value Date/Time   NA 139 02/03/2019 0013   K 3.9 02/03/2019 0013   CL 108 02/03/2019 0013   CO2 24 02/03/2019 0013   GLUCOSE 98 02/03/2019 0013   BUN 17 02/03/2019 0013   CREATININE 0.97 02/03/2019 0013   CALCIUM 8.9 02/03/2019 0013   GFRNONAA >60 02/03/2019 0013   GFRAA >60 02/03/2019 0013   Lab Results  Component Value Date   HGBA1C 5.4 07/08/2017   No results found for: INSULIN CBC    Component Value Date/Time   WBC 9.2 02/03/2019 0013   RBC 4.50 02/03/2019 0013   HGB 12.7 02/03/2019 0013   HCT 40.9 02/03/2019 0013   PLT 344 02/03/2019 0013   MCV 90.9 02/03/2019 0013   MCH 28.2 02/03/2019 0013   MCHC 31.1 02/03/2019 0013   RDW 12.4 02/03/2019 0013   LYMPHSABS 3.2 10/01/2018 1049   MONOABS 0.7 10/01/2018 1049   EOSABS 0.2 10/01/2018 1049   BASOSABS 0.1 10/01/2018 1049   Iron/TIBC/Ferritin/ %Sat    Component Value Date/Time   FERRITIN 83.9 12/02/2013 1103   Lipid Panel     Component Value Date/Time   CHOL 204 (H) 10/01/2018 1049   TRIG 199.0 (H) 10/01/2018 1049   HDL 29.80 (L) 10/01/2018 1049   CHOLHDL 7 10/01/2018 1049   VLDL 39.8 10/01/2018 1049   LDLCALC 135 (H) 10/01/2018 1049   LDLDIRECT 139.0 09/06/2016 1458   Hepatic Function Panel  Component Value Date/Time   PROT 7.0 10/01/2018 1049   ALBUMIN 3.9 10/01/2018 1049   AST 13 10/01/2018 1049   ALT 21 10/01/2018 1049   ALKPHOS 74 10/01/2018 1049   BILITOT 0.5 10/01/2018 1049   BILIDIR 0.1  12/02/2013 1103      Component Value Date/Time   TSH 0.78 02/11/2019 1348   TSH 1.47 12/17/2018 1401   TSH 0.01 (L) 10/31/2018 1204    INDIRECT CALORIMETER done today shows a VO2 of 248 and a REE of 1726.  Her calculated basal metabolic rate is 2703 thus her basal metabolic rate is worse than expected.       OBESITY BEHAVIORAL INTERVENTION VISIT  Today's visit was # 1   Starting weight: 240 lbs Starting date: 02/17/2019 Today's weight : 240 lbs Today's date: 02/17/2019 Total lbs lost to date: 0   ASK: We discussed the diagnosis of obesity with Amanda Mcintosh today and Ayiana agreed to give [place] permission to discuss obesity behavioral modification therapy today.  ASSESS: Makenzye has the diagnosis of obesity and her BMI today is 50.09 Zarria is in the action stage of change   ADVISE: Jamaria was educated on the multiple health risks of obesity as well as the benefit of weight loss to improve her health. She was advised of the need for long term treatment and the importance of lifestyle modifications to improve her current health and to decrease her risk of future health problems.  AGREE: Multiple dietary modification options and treatment options were discussed and  [place] agreed to follow the recommendations documented in the above note.  ARRANGE: Madolyn was educated on the importance of frequent visits to treat obesity as outlined per CMS and USPSTF guidelines and agreed to schedule her next follow up appointment today.  Corey Skains, am acting as Location manager for [employer]. Owens Shark, DO  I have reviewed the above documentation for accuracy and completeness, and I agree with the above. -Jearld Lesch, DO

## 2019-02-18 LAB — VITAMIN D 25 HYDROXY (VIT D DEFICIENCY, FRACTURES): Vit D, 25-Hydroxy: 36.1 ng/mL (ref 30.0–100.0)

## 2019-02-18 LAB — INSULIN, RANDOM: INSULIN: 19.5 u[IU]/mL (ref 2.6–24.9)

## 2019-02-18 LAB — HEMOGLOBIN A1C
Est. average glucose Bld gHb Est-mCnc: 111 mg/dL
HEMOGLOBIN A1C: 5.5 % (ref 4.8–5.6)

## 2019-02-25 ENCOUNTER — Ambulatory Visit (INDEPENDENT_AMBULATORY_CARE_PROVIDER_SITE_OTHER): Payer: 59 | Admitting: Licensed Clinical Social Worker

## 2019-02-25 DIAGNOSIS — F331 Major depressive disorder, recurrent, moderate: Secondary | ICD-10-CM

## 2019-03-03 ENCOUNTER — Encounter: Payer: Self-pay | Admitting: Neurology

## 2019-03-03 ENCOUNTER — Encounter (INDEPENDENT_AMBULATORY_CARE_PROVIDER_SITE_OTHER): Payer: Self-pay

## 2019-03-03 ENCOUNTER — Ambulatory Visit (INDEPENDENT_AMBULATORY_CARE_PROVIDER_SITE_OTHER): Payer: 59 | Admitting: Neurology

## 2019-03-03 ENCOUNTER — Ambulatory Visit (INDEPENDENT_AMBULATORY_CARE_PROVIDER_SITE_OTHER): Payer: Self-pay | Admitting: Bariatrics

## 2019-03-03 VITALS — BP 140/92 | HR 79 | Ht 65.0 in | Wt 242.0 lb

## 2019-03-03 DIAGNOSIS — J343 Hypertrophy of nasal turbinates: Secondary | ICD-10-CM

## 2019-03-03 DIAGNOSIS — G478 Other sleep disorders: Secondary | ICD-10-CM

## 2019-03-03 DIAGNOSIS — Z6841 Body Mass Index (BMI) 40.0 and over, adult: Secondary | ICD-10-CM

## 2019-03-03 DIAGNOSIS — Z820 Family history of epilepsy and other diseases of the nervous system: Secondary | ICD-10-CM | POA: Diagnosis not present

## 2019-03-03 DIAGNOSIS — G4733 Obstructive sleep apnea (adult) (pediatric): Secondary | ICD-10-CM

## 2019-03-03 DIAGNOSIS — G4719 Other hypersomnia: Secondary | ICD-10-CM

## 2019-03-03 NOTE — Progress Notes (Signed)
Subjective:    Patient ID: Amanda Mcintosh is a 48 y.o. female.  HPI     [name] Age, MD, PhD [facility] [address] [place], [place] [address]  Dear Mickel Baas,   I saw your patient, Amanda Mcintosh, upon your kind request in my sleep clinic today for initial consultation of her sleep disorder, in particular, concern for underlying obstructive sleep apnea. The patient is unaccompanied today. As you know, Amanda Mcintosh is a 48 year old right-handed woman with an underlying medical history of reflux disease, vitamin D deficiency, smoking, headaches, dizziness, seasonal allergies, chronic sinusitis, and morbid obesity with a BMI of over 40, who reports snoring and excessive daytime somnolence as well as witnessed apneas per family's report. I reviewed your office note from 01/28/2019. She had a home sleep test about 4-1/2 years ago and I reviewed the results. Test date was 09/14/2014, overall AHI was 9.2 per hour, O2 nadir was 81%. She weighed 231 pounds at the time, BMI of 38. Her Epworth sleepiness score is 7 out of 24 today, fatigue score is 18 out of 63. She's not sleeping well. She reports a bedtime of 9 PM, some difficulty falling asleep. She does turn her bedroom TV off at 9 PM. She has a rise time of 6 AM. No night to night nocturia, occasional AM HAs, does have nasal congestion. Mom had OSA.  She had T/A as a child, no asthma. See Dr. Erik Obey for sinus d/s.  She lives with her husband, she has 1 child. She works for [employer]. She smokes one pack per day, does not utilize alcohol typically and does not drink caffeine on a regular basis.  She is fasting from her church, has an appointment for FU in wt management soon. She has seen an endocrinologist. She has seen an integrative health provider. She has had stress, lost her mom 2 years ago. Recently lost a friend.   Her Past Medical History Is Significant For: Past Medical History:  Diagnosis Date   . Fibroids   . Heartburn   . History of headache   . Obesity   . Thyroid condition   . Vitamin D deficiency     Her Past Surgical History Is Significant For: Past Surgical History:  Procedure Laterality Date  . CHOLECYSTECTOMY  2006  . ROBOT ASSISTED MYOMECTOMY  2011  . TONSILLECTOMY    . WISDOM TOOTH EXTRACTION      Her Family History Is Significant For: Family History  Problem Relation Age of Onset  . Diabetes Mother   . Hypertension Mother   . Heart failure Mother   . Stroke Mother   . High Cholesterol Mother   . Depression Mother   . Anxiety disorder Mother   . Sleep apnea Mother   . Obesity Mother   . Breast cancer Sister   . Skin cancer Sister   . COPD Father        smoker  . High blood pressure Father   . High Cholesterol Father   . HIV/AIDS Brother     Her Social History Is Significant For: Social History   Socioeconomic History  . Marital status: Married    Spouse name: Darryl  . Number of children: 1  . Years of education: Not on file  . Highest education level: Not on file  Occupational History  . Occupation: Holiday representative: [employer]  Social Needs  . Financial resource strain: Not on file  .  Food insecurity:    Worry: Not on file    Inability: Not on file  . Transportation needs:    Medical: Not on file    Non-medical: Not on file  Tobacco Use  . Smoking status: Current Every Day Smoker    Packs/day: 0.30    Types: Cigarettes  . Smokeless tobacco: Never Used  Substance and Sexual Activity  . Alcohol use: No  . Drug use: No  . Sexual activity: Not on file  Lifestyle  . Physical activity:    Days per week: Not on file    Minutes per session: Not on file  . Stress: Not on file  Relationships  . Social connections:    Talks on phone: Not on file    Gets together: Not on file    Attends religious service: Not on file    Active member of club or organization: Not on file    Attends meetings of clubs or  organizations: Not on file    Relationship status: Not on file  Other Topics Concern  . Not on file  Social History Narrative  . Not on file    Her Allergies Are:  Allergies  Allergen Reactions  . Gadolinium Derivatives Other (See Comments)    PT STARTED SHAKING AND FELT VERY COLD INTERNALLY BUT FACE WAS VERY FLUSHED. THIS WAS A DELAYED REACTION (ABOUT 1 HOUR AFTER INJECTION). SHE TOOK BENADRYL AND HYDRATED AND SYMPTOMS SUBSIDED  :   Her Current Medications Are:  Outpatient Encounter Medications as of 50/03/2019  Medication Sig  . cholecalciferol (VITAMIN D3) 25 MCG (1000 UT) tablet Take 1,000 Units by mouth daily.  . Multiple Vitamin (MULTIVITAMIN WITH MINERALS) TABS tablet Take 1 tablet by mouth daily.  . pantoprazole (PROTONIX) 40 MG tablet Take 1 tablet (40 mg total) by mouth daily. (Patient taking differently: Take 40 mg by mouth daily as needed (heart burn). )  . Turmeric 500 MG TABS Take 500 mg by mouth daily.  . vitamin k 100 MCG tablet Take 100 mcg by mouth daily.  . Zinc 50 MG CAPS Take by mouth.  . [DISCONTINUED] chlorpheniramine-HYDROcodone (TUSSIONEX PENNKINETIC ER) 10-8 MG/5ML SUER Take 5 mLs by mouth every 12 (twelve) hours as needed for cough. (Patient not taking: Reported on 02/17/2019)  . [DISCONTINUED] hydrochlorothiazide (HYDRODIURIL) 25 MG tablet Take 1 tablet (25 mg total) by mouth daily.  . [DISCONTINUED] oseltamivir (TAMIFLU) 75 MG capsule Take 1 capsule (75 mg total) by mouth 2 (two) times daily. (Patient not taking: Reported on 02/17/2019)  . [DISCONTINUED] Pregnenolone Micronized POWD by Does not apply route.   No facility-administered encounter medications on file as of 03/03/2019.   :  Review of Systems:  Out of a complete 14 point review of systems, all are reviewed and negative with the exception of these symptoms as listed below:  Review of Systems  Neurological:       PT presents today to discuss her sleep. Pt has had an HST in the past but has never  started cpap.   Epworth Sleepiness Scale 0= would never doze 1= slight chance of dozing 2= moderate chance of dozing 3= high chance of dozing  Sitting and reading: 1 Watching TV: 1 Sitting inactive in a public place (ex. Theater or meeting): 1 As a passenger in a car for an hour without a break: 1 Lying down to rest in the afternoon: 2 Sitting and talking to someone: 0 Sitting quietly after lunch (no alcohol): 1 In a car, while  stopped in traffic: 0 Total: 7     Objective:  Neurological Exam  Physical Exam Physical Examination:   Vitals:   03/03/19 1531  BP: (!) 140/92  Pulse: 79    General Examination: The patient is a very pleasant 48 y.o. female in no acute distress. She appears well-developed and well-nourished and well groomed.   HEENT: Normocephalic, atraumatic, pupils are equal, round and reactive to light. Extraocular tracking is good without limitation to gaze excursion or nystagmus noted. Normal smooth pursuit is noted. Hearing is grossly intact. Face is symmetric with normal facial animation and normal facial sensation. Speech is clear with no dysarthria noted. There is no hypophonia. There is no lip, neck/head, jaw or voice tremor. Neck is supple with full range of passive and active motion. There are no carotid bruits on auscultation. Oropharynx exam reveals: mild mouth dryness, good dental hygiene and mild airway crowding, due to smaller airway entry and thicker soft palate. Tonsils absent. Mallampati is class II. Neck circumference is 15-3/8 inches. Has a mild overbite. Nasal inspection reveals mild nuchal bogginess and inferior turbinate hypertrophy. No septal deviation to speak of.  Chest: Clear to auscultation without wheezing, rhonchi or crackles noted.  Heart: S1+S2+0, regular and normal without murmurs, rubs or gallops noted.   Abdomen: Soft, non-tender and non-distended with normal bowel sounds appreciated on auscultation.  Extremities: There is no  pitting edema in the distal lower extremities bilaterally.   Skin: Warm and dry without trophic changes noted.  Musculoskeletal: exam reveals no obvious joint deformities, tenderness or joint swelling or erythema.   Neurologically:  Mental status: The patient is awake, alert and oriented in all 4 spheres. Her immediate and remote memory, attention, language skills and fund of knowledge are appropriate. There is no evidence of aphasia, agnosia, apraxia or anomia. Speech is clear with normal prosody and enunciation. Thought process is linear. Mood is normal and affect is normal.  Cranial nerves II - XII are as described above under HEENT exam.  Motor exam: Normal bulk, strength and tone is noted. There is no tremor. Romberg is negative. Fine motor skills and coordination: grossly intact.  Cerebellar testing: No dysmetria or intention tremor.  Sensory exam: intact to light touch.  Gait, station and balance: She stands easily. No veering to one side is noted. No leaning to one side is noted. Posture is age-appropriate and stance is narrow based. Gait shows normal stride length and normal pace. No problems turning are noted. Tandem walk is unremarkable.   Assessment and Plan:  In summary, Amanda Mcintosh is a very pleasant 48 y.o.-year old female with an underlying medical history of reflux disease, vitamin D deficiency, smoking, headaches, dizziness, seasonal allergies, chronic sinusitis, and morbid obesity with a BMI of over 40, who presents for sleep evaluation. She was previously diagnosed about 4-1/2 years ago with mild obstructive sleep apnea but did not pursue positive airway pressure therapy at the time. She would benefit from reevaluation. She had a little bit of weight gain since then. She is working on weight loss and has established care at medical weight management clinic.  I had a long chat with the patient about my findings and the diagnosis of OSA, its prognosis and treatment options.  We talked about medical treatments, surgical interventions and non-pharmacological approaches. I explained in particular the risks and ramifications of untreated moderate to severe OSA, especially with respect to developing cardiovascular disease down the Road, including congestive heart failure, difficult to treat hypertension, cardiac  arrhythmias, or stroke. Even type 2 diabetes has, in part, been linked to untreated OSA. Symptoms of untreated OSA include daytime sleepiness, memory problems, mood irritability and mood disorder such as depression and anxiety, lack of energy, as well as recurrent headaches, especially morning headaches. We talked about smoking cessation and trying to maintain a healthy lifestyle in general, as well as the importance of weight control. I encouraged the patient to eat healthy, exercise daily and keep well hydrated, to keep a scheduled bedtime and wake time routine, to not skip any meals and eat healthy snacks in between meals. I advised the patient not to drive when feeling sleepy.  I recommended the following at this time: sleep study with potential positive airway pressure titration. (We will score hypopneas at 4%).   I explained the sleep test procedure to the patient and also outlined possible surgical and non-surgical treatment options of OSA, including the use of a custom-made dental device (which would require a referral to a specialist dentist or oral surgeon), upper airway surgical options, such as pillar implants, radiofrequency surgery, tongue base surgery, and UPPP (which would involve a referral to an ENT surgeon). Rarely, jaw surgery such as mandibular advancement may be considered.  I also explained the CPAP treatment option to the patient, who indicated that she would be willing to try CPAP if the need arises. I explained the importance of being compliant with PAP treatment, not only for insurance purposes but primarily to improve Her symptoms, and for the  patient's long term health benefit, including to reduce Her cardiovascular risks. I answered all her questions today and the patient was in agreement. I plan to see her back after the sleep study is completed and encouraged her to call with any interim questions, concerns, problems or updates.   Thank you very much for allowing me to participate in the care of this nice patient. If I can be of any further assistance to you please do not hesitate to call me at [phone].  Sincerely,   [name] Age, MD, PhD

## 2019-03-03 NOTE — Patient Instructions (Signed)

## 2019-03-05 ENCOUNTER — Ambulatory Visit (INDEPENDENT_AMBULATORY_CARE_PROVIDER_SITE_OTHER): Payer: 59 | Admitting: Family Medicine

## 2019-03-05 ENCOUNTER — Encounter (INDEPENDENT_AMBULATORY_CARE_PROVIDER_SITE_OTHER): Payer: Self-pay | Admitting: Family Medicine

## 2019-03-05 VITALS — BP 120/84 | HR 70 | Ht 65.0 in | Wt 235.0 lb

## 2019-03-05 DIAGNOSIS — E559 Vitamin D deficiency, unspecified: Secondary | ICD-10-CM | POA: Diagnosis not present

## 2019-03-05 DIAGNOSIS — E8881 Metabolic syndrome: Secondary | ICD-10-CM | POA: Diagnosis not present

## 2019-03-05 DIAGNOSIS — Z6838 Body mass index (BMI) 38.0-38.9, adult: Secondary | ICD-10-CM

## 2019-03-05 DIAGNOSIS — Z9189 Other specified personal risk factors, not elsewhere classified: Secondary | ICD-10-CM | POA: Diagnosis not present

## 2019-03-05 MED ORDER — VITAMIN D (ERGOCALCIFEROL) 1.25 MG (50000 UNIT) PO CAPS: 50000.0000 [IU] | ORAL_CAPSULE | ORAL | 0 refills | Status: DC

## 2019-03-07 NOTE — Progress Notes (Signed)
Office: [phone]  /  Fax: [phone]   HPI:   Chief Complaint: OBESITY Amanda Mcintosh is here to discuss her progress with her obesity treatment plan. She is on the Category 2 plan ( fasting for Amanda Mcintosh) and is following her eating plan approximately 30 % of the time. She states she is exercising 0 minutes 0 times per week. Amanda Mcintosh is fasting everyday, eating 1 time per day for entirety of Amanda Mcintosh. She is eating mostly fish and vegetable. She has not been weighing the fish and eating around 1/3 cup of butter beans, 1 cup of collard greens, and salad. The week prior to [place] starting, she found the meal plan to be excessive. Her weight is 235 lb (106.6 kg) today and has had a weight loss of 5 pounds over a period of 2 to 3 weeks since her last visit. She has lost 5 lbs since starting treatment with [place].  Vitamin D Deficiency Amanda Mcintosh has a diagnosis of vitamin D deficiency. She is currently taking OTC Vit D 1,000 IU daily. She notes fatigue and denies nausea, vomiting or muscle weakness.  Insulin Resistance Amanda Mcintosh has a diagnosis of insulin resistance based on her elevated fasting insulin level >5. Insuling level of 19.5. Although Amanda Mcintosh's blood glucose readings are still under good control, insulin resistance puts her at greater risk of metabolic syndrome and diabetes. She is not taking metformin currently and notes indulgences of potatoes previously. She continues to work on diet and exercise to decrease risk of diabetes.  At risk for diabetes Amanda Mcintosh is at higher than average risk for developing diabetes due to her obesity and insulin resistance. She currently denies polyuria or polydipsia.  ASSESSMENT AND PLAN:  Vitamin D deficiency - Plan: Vitamin D, Ergocalciferol, (DRISDOL) 1.25 MG (50000 UT) CAPS capsule  Insulin resistance  At risk for diabetes mellitus  Class 2 severe obesity with serious comorbidity and body mass index (BMI) of 38.0 to 38.9 in adult, unspecified obesity type  ([place])  PLAN:  Vitamin D Deficiency Amanda Mcintosh was informed that low vitamin D levels contributes to fatigue and are associated with obesity, breast, and colon cancer. [name] agrees to start prescription Vit D @50 ,000 IU every week #4 with no refills. She will follow up for routine testing of vitamin D, at least 2-3 times per year. She was informed of the risk of over-replacement of vitamin D and agrees to not increase her dose unless she discusses this with [place] first. Amanda Mcintosh agrees to follow up with our clinic in 2 weeks.  Insulin Resistance Amanda Mcintosh will continue to work on weight loss, exercise, and decreasing simple carbohydrates in her diet to help decrease the risk of diabetes. We dicussed metformin including benefits and risks. She was informed that eating too many simple carbohydrates or too many calories at one sitting increases the likelihood of GI side effects. [name] declined metformin for now and prescription was not written today. We will repeat Hgb a1c and insulin in 3 months. Amanda Mcintosh agrees to follow up with our clinic in 2 weeks as directed to monitor her progress.  Diabetes risk counseling Amanda Mcintosh was given extended (30 minutes) diabetes prevention counseling today. She is 48 y.o. female and has risk factors for diabetes including obesity and insulin resistance. We discussed intensive lifestyle modifications today with an emphasis on weight loss as well as increasing exercise and decreasing simple carbohydrates in her diet.  Obesity Amanda Mcintosh is currently in the action stage of change. As such, her goal is to continue with weight  loss efforts She has agreed to fasting for Amanda Mcintosh, gave dinner requirements of 10-12 oz of lean meat, 1 tablespoon of oil, 1 to 2 cups of vegetables, 1 to 2 cups of beans, 1 piece of fruit, and 1/2 to 1 cup of rice. Amanda Mcintosh has been instructed to work up to a goal of 150 minutes of combined cardio and strengthening exercise per week for weight loss and overall  health benefits. We discussed the following Behavioral Modification Strategies today: increasing lean protein intake, increasing vegetables, work on meal planning and easy cooking plans, better snacking choices, and planning for success   Amanda Mcintosh has agreed to follow up with our clinic in 2 weeks. She was informed of the importance of frequent follow up visits to maximize her success with intensive lifestyle modifications for her multiple health conditions.  ALLERGIES: Allergies  Allergen Reactions  . Gadolinium Derivatives Other (See Comments)    PT STARTED SHAKING AND FELT VERY COLD INTERNALLY BUT FACE WAS VERY FLUSHED. THIS WAS A DELAYED REACTION (ABOUT 1 HOUR AFTER INJECTION). SHE TOOK BENADRYL AND HYDRATED AND SYMPTOMS SUBSIDED    MEDICATIONS: Current Outpatient Medications on File Prior to Visit  Medication Sig Dispense Refill  . cholecalciferol (VITAMIN D3) 25 MCG (1000 UT) tablet Take 1,000 Units by mouth daily.    . Multiple Vitamin (MULTIVITAMIN WITH MINERALS) TABS tablet Take 1 tablet by mouth daily.    . pantoprazole (PROTONIX) 40 MG tablet Take 1 tablet (40 mg total) by mouth daily. (Patient taking differently: Take 40 mg by mouth daily as needed (heart burn). ) 90 tablet 0  . Turmeric 500 MG TABS Take 500 mg by mouth daily.    . vitamin k 100 MCG tablet Take 100 mcg by mouth daily.    . Zinc 50 MG CAPS Take by mouth.     No current facility-administered medications on file prior to visit.     PAST MEDICAL HISTORY: Past Medical History:  Diagnosis Date  . Fibroids   . Heartburn   . History of headache   . Obesity   . Thyroid condition   . Vitamin D deficiency     PAST SURGICAL HISTORY: Past Surgical History:  Procedure Laterality Date  . CHOLECYSTECTOMY  2006  . ROBOT ASSISTED MYOMECTOMY  2011  . TONSILLECTOMY    . WISDOM TOOTH EXTRACTION      SOCIAL HISTORY: Social History   Tobacco Use  . Smoking status: Current Every Day Smoker    Packs/day: 0.30     Types: Cigarettes  . Smokeless tobacco: Never Used  Substance Use Topics  . Alcohol use: No  . Drug use: No    FAMILY HISTORY: Family History  Problem Relation Age of Onset  . Diabetes Mother   . Hypertension Mother   . Heart failure Mother   . Stroke Mother   . High Cholesterol Mother   . Depression Mother   . Anxiety disorder Mother   . Sleep apnea Mother   . Obesity Mother   . Breast cancer Sister   . Skin cancer Sister   . COPD Father        smoker  . High blood pressure Father   . High Cholesterol Father   . HIV/AIDS Brother     ROS: Review of Systems  Constitutional: Positive for malaise/fatigue and weight loss.  Gastrointestinal: Negative for nausea and vomiting.  Genitourinary: Negative for frequency.  Musculoskeletal:       Negative muscle weakness  Endo/Heme/Allergies: Negative for polydipsia.  PHYSICAL EXAM: Blood pressure 120/84, pulse 70, height 5\' 5"  (1.651 m), weight 235 lb (106.6 kg), last menstrual period 02/28/2017, SpO2 99 %. Body mass index is 39.11 kg/m. Physical Exam Vitals signs reviewed.  Constitutional:      Appearance: Normal appearance. She is obese.  Cardiovascular:     Rate and Rhythm: Normal rate.     Pulses: Normal pulses.  Pulmonary:     Effort: Pulmonary effort is normal.     Breath sounds: Normal breath sounds.  Musculoskeletal: Normal range of motion.  Skin:    General: Skin is warm and dry.  Neurological:     Mental Status: She is alert and oriented to person, place, and time.  Psychiatric:        Mood and Affect: Mood normal.        Behavior: Behavior normal.     RECENT LABS AND TESTS: BMET    Component Value Date/Time   NA 139 02/03/2019 0013   K 3.9 02/03/2019 0013   CL 108 02/03/2019 0013   CO2 24 02/03/2019 0013   GLUCOSE 98 02/03/2019 0013   BUN 17 02/03/2019 0013   CREATININE 0.97 02/03/2019 0013   CALCIUM 8.9 02/03/2019 0013   GFRNONAA >60 02/03/2019 0013   GFRAA >60 02/03/2019 0013   Lab  Results  Component Value Date   HGBA1C 5.5 02/17/2019   HGBA1C 5.4 07/08/2017   HGBA1C 5.5 09/06/2016   HGBA1C 5.3 04/20/2015   Lab Results  Component Value Date   INSULIN 19.5 02/17/2019   CBC    Component Value Date/Time   WBC 9.2 02/03/2019 0013   RBC 4.50 02/03/2019 0013   HGB 12.7 02/03/2019 0013   HCT 40.9 02/03/2019 0013   PLT 344 02/03/2019 0013   MCV 90.9 02/03/2019 0013   MCH 28.2 02/03/2019 0013   MCHC 31.1 02/03/2019 0013   RDW 12.4 02/03/2019 0013   LYMPHSABS 3.2 10/01/2018 1049   MONOABS 0.7 10/01/2018 1049   EOSABS 0.2 10/01/2018 1049   BASOSABS 0.1 10/01/2018 1049   Iron/TIBC/Ferritin/ %Sat    Component Value Date/Time   FERRITIN 83.9 12/02/2013 1103   Lipid Panel     Component Value Date/Time   CHOL 204 (H) 10/01/2018 1049   TRIG 199.0 (H) 10/01/2018 1049   HDL 29.80 (L) 10/01/2018 1049   CHOLHDL 7 10/01/2018 1049   VLDL 39.8 10/01/2018 1049   LDLCALC 135 (H) 10/01/2018 1049   LDLDIRECT 139.0 09/06/2016 1458   Hepatic Function Panel     Component Value Date/Time   PROT 7.0 10/01/2018 1049   ALBUMIN 3.9 10/01/2018 1049   AST 13 10/01/2018 1049   ALT 21 10/01/2018 1049   ALKPHOS 74 10/01/2018 1049   BILITOT 0.5 10/01/2018 1049   BILIDIR 0.1 12/02/2013 1103      Component Value Date/Time   TSH 0.78 02/11/2019 1348   TSH 1.47 12/17/2018 1401   TSH 0.01 (L) 10/31/2018 1204      OBESITY BEHAVIORAL INTERVENTION VISIT  Today's visit was # 2   Starting weight: 240 lbs Starting date: 02/17/2019 Today's weight : 235 lbs  Today's date: 03/05/2019 Total lbs lost to date: 5    03/05/2019  Height 5\' 5"  (1.651 m)  Weight 235 lb (106.6 kg)  BMI (Calculated) 39.11  BLOOD PRESSURE - SYSTOLIC 902  BLOOD PRESSURE - DIASTOLIC 84   Body Fat % 40.9 %  Total Body Water (lbs) 82.6 lbs     ASK: We discussed the diagnosis of obesity with  Amanda Mcintosh today and Amanda Mcintosh agreed to give [place] permission to discuss obesity behavioral modification  therapy today.  ASSESS: Amanda Mcintosh has the diagnosis of obesity and her BMI today is 87.19 Amanda Mcintosh is in the action stage of change   ADVISE: Janan was educated on the multiple health risks of obesity as well as the benefit of weight loss to improve her health. She was advised of the need for long term treatment and the importance of lifestyle modifications to improve her current health and to decrease her risk of future health problems.  AGREE: Multiple dietary modification options and treatment options were discussed and  Jean agreed to follow the recommendations documented in the above note.  ARRANGE: Amanda Mcintosh was educated on the importance of frequent visits to treat obesity as outlined per CMS and USPSTF guidelines and agreed to schedule her next follow up appointment today.  I, Amanda Mcintosh, am acting as transcriptionist for Amanda Qua, MD  I have reviewed the above documentation for accuracy and completeness, and I agree with the above. - Amanda Qua, MD

## 2019-03-18 ENCOUNTER — Ambulatory Visit (INDEPENDENT_AMBULATORY_CARE_PROVIDER_SITE_OTHER): Payer: 59 | Admitting: Neurology

## 2019-03-18 ENCOUNTER — Encounter: Payer: Self-pay | Admitting: Neurology

## 2019-03-18 ENCOUNTER — Other Ambulatory Visit: Payer: Self-pay

## 2019-03-18 DIAGNOSIS — J343 Hypertrophy of nasal turbinates: Secondary | ICD-10-CM

## 2019-03-18 DIAGNOSIS — G4733 Obstructive sleep apnea (adult) (pediatric): Secondary | ICD-10-CM

## 2019-03-18 DIAGNOSIS — G478 Other sleep disorders: Secondary | ICD-10-CM

## 2019-03-18 DIAGNOSIS — Z6841 Body Mass Index (BMI) 40.0 and over, adult: Secondary | ICD-10-CM

## 2019-03-18 DIAGNOSIS — Z820 Family history of epilepsy and other diseases of the nervous system: Secondary | ICD-10-CM

## 2019-03-18 DIAGNOSIS — G4719 Other hypersomnia: Secondary | ICD-10-CM

## 2019-03-23 ENCOUNTER — Telehealth: Payer: Self-pay

## 2019-03-23 NOTE — Progress Notes (Signed)
Patient referred by Jodi Mourning, NP, seen by me on 03/03/19, HST on 03/18/19.    Please call and notify the patient that the recent home sleep test showed obstructive sleep apnea in the severe range. While I recommend treatment for this in the form CPAP, her insurance will not approve a sleep study for this (plus, we have currently suspended CPAP titration sleep studies in light of the virus outbreak). We don't have to bring her in for a sleep study with CPAP, but will let her use an autoPAP machine at home, through a DME company (of her choice, or as per insurance requirement). The DME representative will educate her on how to use the machine, how to put the mask on, etc. I have placed an order in the chart. Please send referral, talk to patient, send report to referring MD. We will need a FU in sleep clinic for 10 weeks post-PAP set up, please arrange that with me or one of our NPs. Thanks,   [name] Age, MD, PhD [facility])

## 2019-03-23 NOTE — Telephone Encounter (Signed)
I called pt to discuss her sleep study results. No answer, left a message asking her to call me back. 

## 2019-03-23 NOTE — Procedures (Signed)
[facility] @[facility] [place], [place] [address] NAME:   Amanda Mcintosh                                                                 DOB: 1971-02-26 MEDICAL RECORD no:   [id number]                                              DOS: 03/18/2019 REFERRING PHYSICIAN: Marvis Repress, NP STUDY PERFORMED: HST on [employer] HISTORY: 48 year old woman with a history of reflux disease, vitamin D deficiency, smoking, headaches, dizziness, seasonal allergies, chronic sinusitis, and morbid obesity, who reports snoring and excessive daytime somnolence as well as witnessed apneas per family's report. Her Epworth sleepiness score is 7 out of 24, BMI of 40.4.   STUDY RESULTS:   Total Recording Time:  8 hrs, 58 mins; Total Sleep Time: 7 hrs, 28 mins Total Apnea/Hypopnea Index (AHI): 39.8 /h; RDI: 42.5 /h; REM AHI: n/a Average Oxygen Saturation:  95 %; Lowest Oxygen Desaturation:  81 %  Total Time Oxygen Saturation Below or at 88 %: 4.8 minutes  Average Heart Rate:   60 bpm IMPRESSION:  OSA RECOMMENDATION: This home sleep test demonstrates severe obstructive sleep apnea with a total AHI of 39.8/hour and O2 nadir of 81%. Given the patient's medical history and sleep related complaints, treatment with positive airway pressure (in the form of CPAP) is recommended. This will require a full night CPAP titration study for proper treatment settings, O2 monitoring and mask fitting. Based on the severity of the sleep disordered breathing an attended titration study is indicated. However, patient's insurance has denied an attended sleep study; therefore, the patient will be advised to proceed with an autoPAP titration/trial at home. Please note, that untreated obstructive sleep apnea may carry additional perioperative morbidity. Patients with significant obstructive sleep apnea should receive perioperative PAP therapy and the surgeons and particularly the anesthesiologist should be  informed of the diagnosis and the severity of the sleep disordered breathing. The patient should be cautioned not to drive, work at heights, or operate dangerous or heavy equipment when tired or sleepy. Review and reiteration of good sleep hygiene measures should be pursued with any patient. Other causes of the patient's symptoms, including circadian rhythm disturbances, an underlying mood disorder, medication effect and/or an underlying medical problem cannot be ruled out based on this test. Clinical correlation is recommended. The patient and his referring provider will be notified of the test results. The patient will be seen in follow up in sleep clinic at [facility].  I certify that I have reviewed the raw data recording prior to the issuance of this report in accordance with the standards of the American Academy of Sleep Medicine (AASM).  [name] Age, MD, PhD [facility]) Diplomat, ABPN (Neurology and Sleep)

## 2019-03-23 NOTE — Addendum Note (Signed)
Addended by: [name] Age on: 03/23/2019 03:11 PM   Modules accepted: Orders

## 2019-03-23 NOTE — Telephone Encounter (Signed)
-----   Message from [name] Age, MD sent at 03/23/2019  3:11 PM EDT ----- Patient referred by Jodi Mourning, NP, seen by me on 03/03/19, HST on 03/18/19.    Please call and notify the patient that the recent home sleep test showed obstructive sleep apnea in the severe range. While I recommend treatment for this in the form CPAP, her insurance will not approve a sleep study for this (plus, we have currently suspended CPAP titration sleep studies in light of the virus outbreak). We don't have to bring her in for a sleep study with CPAP, but will let her use an autoPAP machine at home, through a DME company (of her choice, or as per insurance requirement). The DME representative will educate her on how to use the machine, how to put the mask on, etc. I have placed an order in the chart. Please send referral, talk to patient, send report to referring MD. We will need a FU in sleep clinic for 10 weeks post-PAP set up, please arrange that with me or one of our NPs. Thanks,   [name] Age, MD, PhD [facility])

## 2019-03-25 NOTE — Telephone Encounter (Signed)
I called pt. I advised pt that Dr. Rexene Alberts reviewed their sleep study results and found that pt has severe osa. Dr. Rexene Alberts recommends that pt start an auto pap at home. I reviewed PAP compliance expectations with the pt. Pt is agreeable to starting an auto-PAP. I advised pt that an order will be sent to a DME, [facility], and [facility] will call the pt within about one week after they file with the pt's insurance. [facility] will show the pt how to use the machine, fit for masks, and troubleshoot the auto-PAP if needed. A follow up appt was made for insurance purposes with [name], NP on 06/24/2019 at 11:30am. Pt verbalized understanding to arrive 15 minutes early and bring their auto-PAP. A letter with all of this information in it will be mailed to the pt as a reminder. I verified with the pt that the address we have on file is correct. Pt verbalized understanding of results. Pt had no questions at this time but was encouraged to call back if questions arise. I have sent the order to [facility] and have received confirmation that they have received the order.

## 2019-03-26 ENCOUNTER — Ambulatory Visit (INDEPENDENT_AMBULATORY_CARE_PROVIDER_SITE_OTHER): Payer: 59 | Admitting: Family Medicine

## 2019-03-26 ENCOUNTER — Other Ambulatory Visit (INDEPENDENT_AMBULATORY_CARE_PROVIDER_SITE_OTHER): Payer: Self-pay | Admitting: Family Medicine

## 2019-03-26 ENCOUNTER — Encounter (INDEPENDENT_AMBULATORY_CARE_PROVIDER_SITE_OTHER): Payer: Self-pay

## 2019-03-26 DIAGNOSIS — E559 Vitamin D deficiency, unspecified: Secondary | ICD-10-CM

## 2019-04-03 ENCOUNTER — Ambulatory Visit: Payer: 59 | Admitting: Licensed Clinical Social Worker

## 2019-04-30 ENCOUNTER — Ambulatory Visit (INDEPENDENT_AMBULATORY_CARE_PROVIDER_SITE_OTHER): Payer: 59 | Admitting: Internal Medicine

## 2019-04-30 ENCOUNTER — Encounter: Payer: Self-pay | Admitting: Internal Medicine

## 2019-04-30 DIAGNOSIS — M545 Low back pain, unspecified: Secondary | ICD-10-CM | POA: Insufficient documentation

## 2019-04-30 MED ORDER — CYCLOBENZAPRINE HCL 5 MG PO TABS: 5.0000 mg | ORAL_TABLET | Freq: Two times a day (BID) | ORAL | 0 refills | Status: DC | PRN

## 2019-04-30 NOTE — Assessment & Plan Note (Signed)
Suspect SI strain, rx for flexeril and advised to try aleve. Advised to do some more gentle movement to help avoid stasis while working at home.

## 2019-04-30 NOTE — Progress Notes (Signed)
Virtual Visit via Video Note  I connected with Amanda Mcintosh on 04/30/19 at  3:20 PM EDT by a video enabled telemedicine application and verified that I am speaking with the correct person using two identifiers.   I discussed the limitations of evaluation and management by telemedicine and the availability of in person appointments. The patient expressed understanding and agreed to proceed.  History of Present Illness: The patient is a 48 y.o. female with visit for low back pain. Started 3 days ago after lifting some groceries at the store. She denies prior back problems. No trauma or injury recently. She has been working from home and sitting more throughout the day. More on the left side bottom with some radiation into the groin. Denies pain radiating down the leg or numbness or tingling anywhere. Denies change in bowel or bladder. Overall it is stable but not improving. 6/10 pain. Worse with walking or lying down in bed. Has tried tylenol and icy hot and heating pad without a lot of relief.  Observations/Objective: Appearance: normal, breathing appears normal, casual grooming, abdomen does not appear distended, throat normal, pain located left SI region on patient palpation, mental status is A and O times 3  Assessment and Plan: See problem oriented charting  Follow Up Instructions: rx flexeril and try aleve, more activity  I discussed the assessment and treatment plan with the patient. The patient was provided an opportunity to ask questions and all were answered. The patient agreed with the plan and demonstrated an understanding of the instructions.   The patient was advised to call back or seek an in-person evaluation if the symptoms worsen or if the condition fails to improve as anticipated.  Hoyt Koch, MD

## 2019-05-01 ENCOUNTER — Ambulatory Visit: Payer: Self-pay | Admitting: Family

## 2019-06-23 ENCOUNTER — Telehealth: Payer: Self-pay

## 2019-06-23 NOTE — Telephone Encounter (Signed)
Spoke with the patient and they have given verbal consent to file insurance and to do a mychart video visit. Mychart video link has been sent to the patient.   

## 2019-06-24 ENCOUNTER — Encounter: Payer: Self-pay | Admitting: Family Medicine

## 2019-06-24 ENCOUNTER — Other Ambulatory Visit: Payer: Self-pay

## 2019-06-24 ENCOUNTER — Telehealth (INDEPENDENT_AMBULATORY_CARE_PROVIDER_SITE_OTHER): Payer: 59 | Admitting: Family Medicine

## 2019-06-24 DIAGNOSIS — G4733 Obstructive sleep apnea (adult) (pediatric): Secondary | ICD-10-CM

## 2019-06-24 DIAGNOSIS — Z9989 Dependence on other enabling machines and devices: Secondary | ICD-10-CM | POA: Diagnosis not present

## 2019-06-24 NOTE — Progress Notes (Addendum)
PATIENT: Amanda Mcintosh DOB: 1971/07/26  REASON FOR VISIT: follow up HISTORY FROM: patient  Virtual Visit via Telephone Note  I connected with Amanda Mcintosh on 06/24/19 at 11:30 AM EDT by telephone and verified that I am speaking with the correct person using two identifiers.   I discussed the limitations, risks, security and privacy concerns of performing an evaluation and management service by telephone and the availability of in person appointments. I also discussed with the patient that there may be a patient responsible charge related to this service. The patient expressed understanding and agreed to proceed.   History of Present Illness:  [birth year] Amanda Mcintosh is a 48 y.o. female here today for follow up of OSA on CPAP.  She reports doing very well with CPAP therapy.  She has noted that she is grinding her teeth less.  Compliance download dated 05/25/2019 through 06/23/2019 reveals that she is using her machine 28 out of the last 30 days for compliance of 93%.  28 days were used for greater than 4 hours for compliance of 93%.  Average usage was 6 hours and 1 minute.  AHI was 0.5 on 7 to 14 cm of water and an EPR level of 3.  There was no significant leak.  She returns today for evaluation.   History (copied from Dr Guadelupe Sabin note on 03/03/2019)  Dear Amanda Mcintosh,   I saw your patient, Amanda Mcintosh, upon your kind request in my sleep clinic today for initial consultation of her sleep disorder, in particular, concern for underlying obstructive sleep apnea. The patient is unaccompanied today. As you know, Amanda Mcintosh is a 48 year old right-handed woman with an underlying medical history of reflux disease, vitamin D deficiency, smoking, headaches, dizziness, seasonal allergies, chronic sinusitis, and morbid obesity with a BMI of over 40, who reports snoring and excessive daytime somnolence as well as witnessed apneas per familys report. I reviewed your office note from  01/28/2019. She had a home sleep test about 4-1/2 years ago and I reviewed the results. Test date was 09/14/2014, overall AHI was 9.2 per hour, O2 nadir was 81%. She weighed 231 pounds at the time, BMI of 38. Her Epworth sleepiness score is 7 out of 24 today, fatigue score is 18 out of 63. Shes not sleeping well. She reports a bedtime of 9 PM, some difficulty falling asleep. She does turn her bedroom TV off at 9 PM. She has a rise time of 6 AM. No night to night nocturia, occasional AM HAs, does have nasal congestion. Mom had OSA.  She had T/A as a child, no asthma. See Dr. Erik Obey for sinus d/s.  She lives with her husband, she has 1 child. She works for [employer]. She smokes one pack per day, does not utilize alcohol typically and does not drink caffeine on a regular basis.  She is fasting from her church, has an appointment for FU in wt management soon. She has seen an endocrinologist. She has seen an integrative health provider. She has had stress, lost her mom 2 years ago. Recently lost a friend.   Observations/Objective:  Generalized: Well developed, in no acute distress  Mentation: Alert oriented to time, place, history taking. Follows all commands speech and language fluent   Assessment and Plan:  48 y.o. year old female  has a past medical history of Fibroids, Heartburn, History of headache, Obesity, Thyroid condition, and Vitamin D deficiency. here with    ICD-10-CM   1. OSA on CPAP  G47.33 For home use only DME continuous positive airway pressure (CPAP)   Z99.89    Ms. Pagaduan continues to do very well on CPAP therapy.  Download report reveals excellent compliance.  She was encouraged to continue using CPAP nightly and for greater than 4 hours each night.  I will send an order for supplies today.  She was instructed to follow-up annually.  Appointment has been scheduled for next year.  She verbalizes understanding and agreement with this plan.  Orders Placed This Encounter  Procedures     For home use only DME continuous positive airway pressure (CPAP)    Supplies    Order Specific Question:   Length of Need    Answer:   Lifetime    Order Specific Question:   Patient has OSA or probable OSA    Answer:   Yes    Order Specific Question:   Is the patient currently using CPAP in the home    Answer:   Yes    Order Specific Question:   Settings    Answer:   Other see comments    Order Specific Question:   CPAP supplies needed    Answer:   Mask, headgear, cushions, filters, heated tubing and water chamber    No orders of the defined types were placed in this encounter.    Follow Up Instructions:  I discussed the assessment and treatment plan with the patient. The patient was provided an opportunity to ask questions and all were answered. The patient agreed with the plan and demonstrated an understanding of the instructions.   The patient was advised to call back or seek an in-person evaluation if the symptoms worsen or if the condition fails to improve as anticipated.  I provided 15 minutes of non-face-to-face time during this encounter.  Patient is in her car during video conference.  Provider is located in the office.  Maryelizabeth Kaufmann, CMA helped to facilitate visit.   Debbora Presto, NP   I reviewed the above note and documentation by the Nurse Practitioner and agree with the history, exam, assessment and plan as outlined above. I was immediately available for consultation. [name] Age, MD, PhD [facility])

## 2019-07-15 ENCOUNTER — Telehealth: Payer: Self-pay

## 2019-07-15 ENCOUNTER — Other Ambulatory Visit: Payer: Self-pay | Admitting: Family

## 2019-07-15 DIAGNOSIS — R21 Rash and other nonspecific skin eruption: Secondary | ICD-10-CM

## 2019-07-15 NOTE — Telephone Encounter (Signed)
Copied from [place] [id number]. Topic: Referral - Request for Referral >> Jul 15, 2019 10:25 AM Yvette Rack wrote: Has patient seen PCP for this complaint? yes  *If NO, is insurance requiring patient see PCP for this issue before PCP can refer them? Referral for which specialty: Dermatology Preferred provider/office: no specific provider Reason for referral: Pt having breakouts all over her skin mainly on face and legs

## 2020-01-05 ENCOUNTER — Other Ambulatory Visit: Payer: Self-pay | Admitting: Family

## 2020-01-05 ENCOUNTER — Other Ambulatory Visit: Payer: Self-pay

## 2020-01-05 ENCOUNTER — Ambulatory Visit (INDEPENDENT_AMBULATORY_CARE_PROVIDER_SITE_OTHER): Payer: 59

## 2020-01-05 ENCOUNTER — Ambulatory Visit (INDEPENDENT_AMBULATORY_CARE_PROVIDER_SITE_OTHER): Payer: 59 | Admitting: Family

## 2020-01-05 ENCOUNTER — Encounter: Payer: Self-pay | Admitting: Family

## 2020-01-05 VITALS — BP 148/84 | HR 83 | Temp 98.5°F | Ht 65.0 in | Wt 262.8 lb

## 2020-01-05 DIAGNOSIS — M545 Low back pain, unspecified: Secondary | ICD-10-CM

## 2020-01-05 DIAGNOSIS — R030 Elevated blood-pressure reading, without diagnosis of hypertension: Secondary | ICD-10-CM

## 2020-01-05 MED ORDER — METHOCARBAMOL 500 MG PO TABS: 500.0000 mg | ORAL_TABLET | Freq: Every evening | ORAL | 0 refills | Status: DC | PRN

## 2020-01-05 MED ORDER — MELOXICAM 15 MG PO TABS: 15.0000 mg | ORAL_TABLET | Freq: Every day | ORAL | 0 refills | Status: DC

## 2020-01-05 NOTE — Progress Notes (Signed)
Amanda Mcintosh is a 49 y.o. female with the following history as recorded in EpicCare:  Patient Active Problem List   Diagnosis Date Noted  . OSA on CPAP 06/24/2019  . Low back pain 04/30/2019  . Dizziness 07/04/2017  . Chronic seasonal allergic rhinitis 01/25/2017  . Chronic sinusitis 01/25/2017  . Lateral epicondylitis 01/13/2016  . Amenorrhea 06/28/2015  . Situational syncope 05/07/2015  . GERD (gastroesophageal reflux disease) 04/21/2015  . Routine general medical examination at a health care facility 04/21/2015  . Unspecified vitamin D deficiency 12/02/2013  . Class 2 severe obesity with serious comorbidity and body mass index (BMI) of 38.0 to 38.9 in adult [facility]) 12/02/2013  . Iron deficiency anemia 12/02/2013  . Tobacco use disorder 12/02/2013  . Migraine without aura, with intractable migraine, so stated, without mention of status migrainosus 03/11/2013  . Family history of early CAD 09/08/2012    Current Outpatient Medications  Medication Sig Dispense Refill  . Ascorbic Acid (VITAMIN C) 1000 MG tablet Take 1,000 mg by mouth daily.    . cholecalciferol (VITAMIN D3) 25 MCG (1000 UT) tablet Take 1,000 Units by mouth daily.    . ferrous sulfate 325 (65 FE) MG EC tablet Take 325 mg by mouth 3 (three) times a week.    . Multiple Vitamin (MULTIVITAMIN WITH MINERALS) TABS tablet Take 1 tablet by mouth daily.    . pantoprazole (PROTONIX) 40 MG tablet Take 1 tablet (40 mg total) by mouth daily. (Patient taking differently: Take 40 mg by mouth daily as needed (heart burn). ) 90 tablet 0  . Turmeric 500 MG TABS Take 500 mg by mouth daily.    . vitamin B-12 (CYANOCOBALAMIN) 100 MCG tablet Take 100 mcg by mouth daily.    . Vitamin D, Ergocalciferol, (DRISDOL) 1.25 MG (50000 UT) CAPS capsule Take 1 capsule (50,000 Units total) by mouth every 7 (seven) days. 4 capsule 0  . vitamin E 1000 UNIT capsule Take 1,000 Units by mouth daily.    . vitamin k 100 MCG tablet Take 100 mcg by mouth  daily.    . Zinc 50 MG CAPS Take by mouth.    . cyclobenzaprine (FLEXERIL) 5 MG tablet Take 1 tablet (5 mg total) by mouth 2 (two) times daily as needed for muscle spasms. (Patient not taking: Reported on 01/05/2020) 30 tablet 0  . meloxicam (MOBIC) 15 MG tablet Take 1 tablet (15 mg total) by mouth daily. 30 tablet 0  . methocarbamol (ROBAXIN) 500 MG tablet Take 1 tablet (500 mg total) by mouth at bedtime as needed for muscle spasms. 30 tablet 0   No current facility-administered medications for this visit.    Allergies: Gadolinium derivatives  Past Medical History:  Diagnosis Date  . Fibroids   . Heartburn   . History of headache   . Obesity   . Thyroid condition   . Vitamin D deficiency     Past Surgical History:  Procedure Laterality Date  . CHOLECYSTECTOMY  2006  . ROBOT ASSISTED MYOMECTOMY  2011  . TONSILLECTOMY    . WISDOM TOOTH EXTRACTION      Family History  Problem Relation Age of Onset  . Diabetes Mother   . Hypertension Mother   . Heart failure Mother   . Stroke Mother   . High Cholesterol Mother   . Depression Mother   . Anxiety disorder Mother   . Sleep apnea Mother   . Obesity Mother   . Breast cancer Sister   . Skin  cancer Sister   . COPD Father        smoker  . High blood pressure Father   . High Cholesterol Father   . HIV/AIDS Brother     Social History   Tobacco Use  . Smoking status: Former Smoker    Packs/day: 0.30    Types: Cigarettes    Quit date: 12/01/2019    Years since quitting: 0.0  . Smokeless tobacco: Never Used  Substance Use Topics  . Alcohol use: No    Subjective:  Patient presents today with low back pain x 4-6 weeks; notes that she feels "constant pressure" and tightness in her back; no numbness or tingling radiating down into her legs. Has gained 20+ pounds in the past 6 months; no changes in bowel or bladder habits; no obvious injury to her back.      Objective:  Vitals:   01/05/20 1300  BP: (!) 148/84  Pulse: 83   Temp: 98.5 F (36.9 C)  TempSrc: Oral  SpO2: 98%  Weight: 262 lb 12.8 oz (119.2 kg)  Height: 5\' 5"  (1.651 m)    General: Well developed, well nourished, in no acute distress  Skin : Warm and dry.  Head: Normocephalic and atraumatic  Lungs: Respirations unlabored;  Musculoskeletal: No deformities; no active joint inflammation  Extremities: No edema, cyanosis, clubbing  Vessels: Symmetric bilaterally  Neurologic: Alert and oriented; speech intact; face symmetrical; moves all extremities well; CNII-XII intact without focal deficit   Assessment:  1. Bilateral low back pain without sciatica, unspecified chronicity   2. Elevated blood pressure reading     Plan:  Suspect muscular; patient aware that she needs to work on weight loss as this would contribute to chronic back pain; will update lumbar X ray today; trial of Mobic and Robaxin; will most likely need to refer to PT and/or sports medicine.  I did also ask patient to start checking her blood pressure again; she may need to go back on medication; she feels that pain is causing it to be elevated in office today.   No follow-ups on file.  Orders Placed This Encounter  Procedures  . DG Lumbar Spine 2-3 Views    Standing Status:   Future    Number of Occurrences:   1    Standing Expiration Date:   03/04/2021    Order Specific Question:   Reason for Exam (SYMPTOM  OR DIAGNOSIS REQUIRED)    Answer:   low back pain    Order Specific Question:   Is patient pregnant?    Answer:   No    Order Specific Question:   Preferred imaging location?    Answer:   [facility]    Order Specific Question:   Radiology Contrast Protocol - do NOT remove file path    Answer:   [email or web address]    Requested Prescriptions   Signed Prescriptions Disp Refills  . meloxicam (MOBIC) 15 MG tablet 30 tablet 0    Sig: Take 1 tablet (15 mg total) by mouth daily.  . methocarbamol (ROBAXIN) 500 MG tablet 30  tablet 0    Sig: Take 1 tablet (500 mg total) by mouth at bedtime as needed for muscle spasms.

## 2020-01-11 ENCOUNTER — Telehealth: Payer: Self-pay

## 2020-01-11 NOTE — Telephone Encounter (Signed)
Spoke with patient and info given 

## 2020-01-11 NOTE — Telephone Encounter (Signed)
X-ray is normal; if the symptoms persist after trial of medication that was given yesterday, please schedule a follow-up with our sports medicine providers.  (My-chart message sent to patient)

## 2020-01-11 NOTE — Telephone Encounter (Signed)
Copied from [place] [phone]. Topic: General - Other >> Jan 11, 2020  1:32 PM Leward Quan A wrote: Reason for CRM: Pateint called to request a call back in reference to her xray results done on 01/05/20 asking for a call back please at Ph# [phone]

## 2020-04-05 ENCOUNTER — Encounter: Payer: Self-pay | Admitting: Family

## 2020-04-05 ENCOUNTER — Other Ambulatory Visit: Payer: Self-pay

## 2020-04-05 ENCOUNTER — Ambulatory Visit (INDEPENDENT_AMBULATORY_CARE_PROVIDER_SITE_OTHER): Payer: 59 | Admitting: Family

## 2020-04-05 VITALS — BP 154/82 | HR 96 | Temp 98.1°F | Ht 65.0 in | Wt 265.6 lb

## 2020-04-05 DIAGNOSIS — R5383 Other fatigue: Secondary | ICD-10-CM

## 2020-04-05 DIAGNOSIS — E559 Vitamin D deficiency, unspecified: Secondary | ICD-10-CM

## 2020-04-05 DIAGNOSIS — Z6838 Body mass index (BMI) 38.0-38.9, adult: Secondary | ICD-10-CM

## 2020-04-05 DIAGNOSIS — R7309 Other abnormal glucose: Secondary | ICD-10-CM | POA: Diagnosis not present

## 2020-04-05 LAB — CBC WITH DIFFERENTIAL/PLATELET
Basophils Absolute: 0 10*3/uL (ref 0.0–0.1)
Basophils Relative: 0.4 % (ref 0.0–3.0)
Eosinophils Absolute: 0.1 10*3/uL (ref 0.0–0.7)
Eosinophils Relative: 0.9 % (ref 0.0–5.0)
HCT: 39.6 % (ref 36.0–46.0)
Hemoglobin: 13.2 g/dL (ref 12.0–15.0)
Lymphocytes Relative: 36.1 % (ref 12.0–46.0)
Lymphs Abs: 3.3 10*3/uL (ref 0.7–4.0)
MCHC: 33.4 g/dL (ref 30.0–36.0)
MCV: 88.3 fl (ref 78.0–100.0)
Monocytes Absolute: 0.7 10*3/uL (ref 0.1–1.0)
Monocytes Relative: 7.5 % (ref 3.0–12.0)
Neutro Abs: 5 10*3/uL (ref 1.4–7.7)
Neutrophils Relative %: 55.1 % (ref 43.0–77.0)
Platelets: 367 10*3/uL (ref 150.0–400.0)
RBC: 4.48 Mil/uL (ref 3.87–5.11)
RDW: 12.7 % (ref 11.5–15.5)
WBC: 9.1 10*3/uL (ref 4.0–10.5)

## 2020-04-05 LAB — COMPREHENSIVE METABOLIC PANEL WITH GFR
ALT: 22 U/L (ref 0–35)
AST: 15 U/L (ref 0–37)
Albumin: 4.1 g/dL (ref 3.5–5.2)
Alkaline Phosphatase: 74 U/L (ref 39–117)
BUN: 16 mg/dL (ref 6–23)
CO2: 26 meq/L (ref 19–32)
Calcium: 8.9 mg/dL (ref 8.4–10.5)
Chloride: 105 meq/L (ref 96–112)
Creatinine, Ser: 0.76 mg/dL (ref 0.40–1.20)
GFR: 97.13 mL/min
Glucose, Bld: 101 mg/dL — ABNORMAL HIGH (ref 70–99)
Potassium: 4 meq/L (ref 3.5–5.1)
Sodium: 138 meq/L (ref 135–145)
Total Bilirubin: 0.4 mg/dL (ref 0.2–1.2)
Total Protein: 7 g/dL (ref 6.0–8.3)

## 2020-04-05 LAB — VITAMIN D 25 HYDROXY (VIT D DEFICIENCY, FRACTURES): VITD: 29.11 ng/mL — ABNORMAL LOW (ref 30.00–100.00)

## 2020-04-05 LAB — HEMOGLOBIN A1C: Hgb A1c MFr Bld: 5.6 % (ref 4.6–6.5)

## 2020-04-05 LAB — TSH: TSH: 1.13 u[IU]/mL (ref 0.35–4.50)

## 2020-04-05 MED ORDER — HYDROCHLOROTHIAZIDE 25 MG PO TABS: 25.0000 mg | ORAL_TABLET | Freq: Every day | ORAL | 0 refills | Status: DC

## 2020-04-05 NOTE — Progress Notes (Signed)
Amanda Mcintosh is a 49 y.o. female with the following history as recorded in [employer]:  Patient Active Problem List   Diagnosis Date Noted  . OSA on CPAP 06/24/2019  . Low back pain 04/30/2019  . Dizziness 07/04/2017  . Chronic seasonal allergic rhinitis 01/25/2017  . Chronic sinusitis 01/25/2017  . Lateral epicondylitis 01/13/2016  . Amenorrhea 06/28/2015  . Situational syncope 05/07/2015  . GERD (gastroesophageal reflux disease) 04/21/2015  . Routine general medical examination at a health care facility 04/21/2015  . Unspecified vitamin D deficiency 12/02/2013  . Class 2 severe obesity with serious comorbidity and body mass index (BMI) of 38.0 to 38.9 in adult [facility]) 12/02/2013  . Iron deficiency anemia 12/02/2013  . Tobacco use disorder 12/02/2013  . Migraine without aura, with intractable migraine, so stated, without mention of status migrainosus 03/11/2013  . Family history of early CAD 09/08/2012    Current Outpatient Medications  Medication Sig Dispense Refill  . Ascorbic Acid (VITAMIN C) 1000 MG tablet Take 1,000 mg by mouth daily.    . cholecalciferol (VITAMIN D3) 25 MCG (1000 UT) tablet Take 1,000 Units by mouth daily.    . ferrous sulfate 325 (65 FE) MG EC tablet Take 325 mg by mouth 3 (three) times a week.    . Multiple Vitamin (MULTIVITAMIN WITH MINERALS) TABS tablet Take 1 tablet by mouth daily.    . pantoprazole (PROTONIX) 40 MG tablet Take 1 tablet (40 mg total) by mouth daily. (Patient taking differently: Take 40 mg by mouth daily as needed (heart burn). ) 90 tablet 0  . Turmeric 500 MG TABS Take 500 mg by mouth daily.    . vitamin B-12 (CYANOCOBALAMIN) 100 MCG tablet Take 100 mcg by mouth daily.    . vitamin E 1000 UNIT capsule Take 1,000 Units by mouth daily.    . vitamin k 100 MCG tablet Take 100 mcg by mouth daily.    . Zinc 50 MG CAPS Take by mouth.    . cyclobenzaprine (FLEXERIL) 5 MG tablet Take 1 tablet (5 mg total) by mouth 2 (two) times daily as needed  for muscle spasms. (Patient not taking: Reported on 01/05/2020) 30 tablet 0  . hydrochlorothiazide (HYDRODIURIL) 25 MG tablet Take 1 tablet (25 mg total) by mouth daily. 90 tablet 0  . meloxicam (MOBIC) 15 MG tablet Take 1 tablet (15 mg total) by mouth daily. (Patient not taking: Reported on 04/05/2020) 30 tablet 0  . methocarbamol (ROBAXIN) 500 MG tablet Take 1 tablet (500 mg total) by mouth at bedtime as needed for muscle spasms. (Patient not taking: Reported on 04/05/2020) 30 tablet 0  . Vitamin D, Ergocalciferol, (DRISDOL) 1.25 MG (50000 UT) CAPS capsule Take 1 capsule (50,000 Units total) by mouth every 7 (seven) days. (Patient not taking: Reported on 04/05/2020) 4 capsule 0   No current facility-administered medications for this visit.    Allergies: Gadolinium derivatives  Past Medical History:  Diagnosis Date  . Fibroids   . Heartburn   . History of headache   . Obesity   . Thyroid condition   . Vitamin D deficiency     Past Surgical History:  Procedure Laterality Date  . CHOLECYSTECTOMY  2006  . ROBOT ASSISTED MYOMECTOMY  2011  . TONSILLECTOMY    . WISDOM TOOTH EXTRACTION      Family History  Problem Relation Age of Onset  . Diabetes Mother   . Hypertension Mother   . Heart failure Mother   . Stroke Mother   .  High Cholesterol Mother   . Depression Mother   . Anxiety disorder Mother   . Sleep apnea Mother   . Obesity Mother   . Breast cancer Sister   . Skin cancer Sister   . COPD Father        smoker  . High blood pressure Father   . High Cholesterol Father   . HIV/AIDS Brother     Social History   Tobacco Use  . Smoking status: Former Smoker    Packs/day: 0.30    Types: Cigarettes    Quit date: 12/01/2019    Years since quitting: 0.3  . Smokeless tobacco: Never Used  Substance Use Topics  . Alcohol use: No    Subjective:  Follow-up on hypertension; has gained at least 30 pounds in the past year and knows this is affecting her blood pressure. Denies any chest  pain, shortness of breath, or headache. Feels that her vision is not as good recently; Would like to get referred back to her weight loss provider; has purchased a home treadmill; is committed to getting her weight back down; was able to quit smoking in November 2020;    Objective:  Vitals:   04/05/20 1010  BP: (!) 154/82  Pulse: 96  Temp: 98.1 F (36.7 C)  TempSrc: Oral  SpO2: 97%  Weight: 265 lb 9.6 oz (120.5 kg)  Height: _0  (1.651 m)    General: Well developed, well nourished, in no acute distress  Skin : Warm and dry.  Head: Normocephalic and atraumatic  Lungs: Respirations unlabored; clear to auscultation bilaterally without wheeze, rales, rhonchi  [facility] exam: normal rate and regular rhythm.  Neurologic: Alert and oriented; speech intact; face symmetrical; moves all extremities well; CNII-XII intact without focal deficit  Assessment:  1. Class 2 severe obesity with serious comorbidity and body mass index (BMI) of 38.0 to 38.9 in adult, unspecified obesity type ([place])   2. Other fatigue   3. Vitamin D deficiency   4. Elevated glucose     Plan:  Re-start HCTZ 25 mg daily; update labs today; refer back to weight loss specialist; congratulated patient on quitting smoking; follow-up in 1 month, sooner prn.  This visit occurred during the SARS-CoV-2 public health emergency.  Safety protocols were in place, including screening questions prior to the visit, additional usage of staff PPE, and extensive cleaning of exam room while observing appropriate contact time as indicated for disinfecting solutions.      Return in about 1 month (around 05/05/2020).  Orders Placed This Encounter  Procedures  . CBC with Differential/Platelet  . Comp Met (CMET)  . HgB A1c  . TSH  . Vitamin D (25 hydroxy)  . Amb Ref to Medical Weight Management    Referral Priority:   Routine    Referral Type:   Consultation    Referred to Provider:   Eber Jones, MD    Number of Visits Requested:    1    Requested Prescriptions   Signed Prescriptions Disp Refills  . hydrochlorothiazide (HYDRODIURIL) 25 MG tablet 90 tablet 0    Sig: Take 1 tablet (25 mg total) by mouth daily.

## 2020-05-03 ENCOUNTER — Telehealth: Payer: Self-pay

## 2020-05-03 ENCOUNTER — Telehealth: Payer: Self-pay | Admitting: Family

## 2020-05-03 NOTE — Telephone Encounter (Signed)
New message:   Pt is calling and would like a call back in regards to her last lab results.

## 2020-05-03 NOTE — Telephone Encounter (Signed)
Results given to patient today. 

## 2020-05-03 NOTE — Telephone Encounter (Signed)
Labs are stable; your thyroid level is fine. Please take at least 2000 IU Vitamin D3 daily;

## 2020-05-03 NOTE — Telephone Encounter (Signed)
  New message   Seen on 4.6.2021  Per patient calling for test results.

## 2020-05-06 ENCOUNTER — Ambulatory Visit: Payer: 59 | Admitting: Family

## 2020-05-17 ENCOUNTER — Telehealth: Payer: Self-pay | Admitting: Family

## 2020-05-17 NOTE — Telephone Encounter (Signed)
New message:    Pt is calling and states she needs her muscle relaxers sent to a pharmacy in [place]. [facility] [address], [place]. She states she left them at home and she only needs about 5 to last her until she gets back home. Please advise.

## 2020-05-18 MED ORDER — CYCLOBENZAPRINE HCL 5 MG PO TABS: 5.0000 mg | ORAL_TABLET | Freq: Two times a day (BID) | ORAL | 0 refills | Status: DC | PRN

## 2020-05-18 NOTE — Telephone Encounter (Signed)
Spoke with patient and info given 

## 2020-05-18 NOTE — Telephone Encounter (Signed)
Short term refill sent for Flexeril; according to her medication list, she told us she wasn't taking Flexeril or Robaxin so I am not sure which has been using.

## 2020-05-27 ENCOUNTER — Telehealth: Payer: Self-pay | Admitting: Family

## 2020-05-27 NOTE — Telephone Encounter (Signed)
Left message for patient

## 2020-05-27 NOTE — Telephone Encounter (Signed)
She should not need a referral to a chiropractor. She can call and make an appointment with who she wants to see.

## 2020-05-27 NOTE — Telephone Encounter (Signed)
New Message:    Pt is calling and would like a referral to a chiropractor. She states the muscle relaxer's are no longer working. Please advise.

## 2020-06-20 ENCOUNTER — Ambulatory Visit: Payer: 59 | Admitting: Family

## 2020-06-22 ENCOUNTER — Ambulatory Visit: Payer: 59 | Admitting: Family

## 2020-06-27 ENCOUNTER — Other Ambulatory Visit: Payer: Self-pay

## 2020-06-27 ENCOUNTER — Encounter: Payer: Self-pay | Admitting: Family Medicine

## 2020-06-27 ENCOUNTER — Ambulatory Visit (INDEPENDENT_AMBULATORY_CARE_PROVIDER_SITE_OTHER): Payer: No Typology Code available for payment source | Admitting: Family Medicine

## 2020-06-27 VITALS — BP 132/89 | HR 63 | Ht 65.0 in | Wt 267.0 lb

## 2020-06-27 DIAGNOSIS — Z9989 Dependence on other enabling machines and devices: Secondary | ICD-10-CM

## 2020-06-27 DIAGNOSIS — G4733 Obstructive sleep apnea (adult) (pediatric): Secondary | ICD-10-CM | POA: Diagnosis not present

## 2020-06-27 NOTE — Patient Instructions (Signed)
Please continue using your CPAP regularly. While your insurance requires that you use CPAP at least 4 hours each night on 70% of the nights, I recommend, that you not skip any nights and use it throughout the night if you can. Getting used to CPAP and staying with the treatment long term does take time and patience and discipline. Untreated obstructive sleep apnea when it is moderate to severe can have an adverse impact on cardiovascular health and raise her risk for heart disease, arrhythmias, hypertension, congestive heart failure, stroke and diabetes. Untreated obstructive sleep apnea causes sleep disruption, nonrestorative sleep, and sleep deprivation. This can have an impact on your day to day functioning and cause daytime sleepiness and impairment of cognitive function, memory loss, mood disturbance, and problems focussing. Using CPAP regularly can improve these symptoms.   Please work on CPAP compliance. We will follow up in 3 months   Sleep Apnea Sleep apnea affects breathing during sleep. It causes breathing to stop for a short time or to become shallow. It can also increase the risk of:  Heart attack.  Stroke.  Being very overweight (obese).  Diabetes.  Heart failure.  Irregular heartbeat. The goal of treatment is to help you breathe normally again. What are the causes? There are three kinds of sleep apnea:  Obstructive sleep apnea. This is caused by a blocked or collapsed airway.  Central sleep apnea. This happens when the brain does not send the right signals to the muscles that control breathing.  Mixed sleep apnea. This is a combination of obstructive and central sleep apnea. The most common cause of this condition is a collapsed or blocked airway. This can happen if:  Your throat muscles are too relaxed.  Your tongue and tonsils are too large.  You are overweight.  Your airway is too small. What increases the risk?  Being overweight.  Smoking.  Having a  small airway.  Being older.  Being female.  Drinking alcohol.  Taking medicines to calm yourself (sedatives or tranquilizers).  Having family members with the condition. What are the signs or symptoms?  Trouble staying asleep.  Being sleepy or tired during the day.  Getting angry a lot.  Loud snoring.  Headaches in the morning.  Not being able to focus your mind (concentrate).  Forgetting things.  Less interest in sex.  Mood swings.  Personality changes.  Feelings of sadness (depression).  Waking up a lot during the night to pee (urinate).  Dry mouth.  Sore throat. How is this diagnosed?  Your medical history.  A physical exam.  A test that is done when you are sleeping (sleep study). The test is most often done in a sleep lab but may also be done at home. How is this treated?   Sleeping on your side.  Using a medicine to get rid of mucus in your nose (decongestant).  Avoiding the use of alcohol, medicines to help you relax, or certain pain medicines (narcotics).  Losing weight, if needed.  Changing your diet.  Not smoking.  Using a machine to open your airway while you sleep, such as: ? An oral appliance. This is a mouthpiece that shifts your lower jaw forward. ? A CPAP device. This device blows air through a mask when you breathe out (exhale). ? An EPAP device. This has valves that you put in each nostril. ? A BPAP device. This device blows air through a mask when you breathe in (inhale) and breathe out.  Having surgery if  other treatments do not work. It is important to get treatment for sleep apnea. Without treatment, it can lead to:  High blood pressure.  Coronary artery disease.  In men, not being able to have an erection (impotence).  Reduced thinking ability. Follow these instructions at home: Lifestyle  Make changes that your doctor recommends.  Eat a healthy diet.  Lose weight if needed.  Avoid alcohol, medicines to help  you relax, and some pain medicines.  Do not use any products that contain nicotine or tobacco, such as cigarettes, e-cigarettes, and chewing tobacco. If you need help quitting, ask your doctor. General instructions  Take over-the-counter and prescription medicines only as told by your doctor.  If you were given a machine to use while you sleep, use it only as told by your doctor.  If you are having surgery, make sure to tell your doctor you have sleep apnea. You may need to bring your device with you.  Keep all follow-up visits as told by your doctor. This is important. Contact a doctor if:  The machine that you were given to use during sleep bothers you or does not seem to be working.  You do not get better.  You get worse. Get help right away if:  Your chest hurts.  You have trouble breathing in enough air.  You have an uncomfortable feeling in your back, arms, or stomach.  You have trouble talking.  One side of your body feels weak.  A part of your face is hanging down. These symptoms may be an emergency. Do not wait to see if the symptoms will go away. Get medical help right away. Call your local emergency services (911 in the [place] Do not drive yourself to the hospital. Summary  This condition affects breathing during sleep.  The most common cause is a collapsed or blocked airway.  The goal of treatment is to help you breathe normally while you sleep. This information is not intended to replace advice given to you by your health care provider. Make sure you discuss any questions you have with your health care provider. Document Revised: 10/03/2018 Document Reviewed: 08/12/2018 Elsevier Patient Education  [place].

## 2020-06-27 NOTE — Progress Notes (Addendum)
PATIENT: Amanda Mcintosh DOB: [birth year]  REASON FOR VISIT: follow up HISTORY FROM: patient  Chief Complaint  Patient presents with  . Follow-up    rm 5, alone cpap fu, pt states she is doing well with cpap, vacations a lot and forgets to bring machine     HISTORY OF PRESENT ILLNESS: Today 61/60/73 Amanda Mcintosh is a 49 y.o. female here today for follow up for OSA on CPAP. She admits that she has not been compliant with CPAP usage recently. She has gone on several vacations and not taken her machine. She knows that she needs to use it. No other concerns. She has recently quit smoking. She is working on [employer].   Compliance report dated 03/17/2020 through 06/14/2020 reveals that she used CPAP 26 of the last 90 days for compliance of 29%.  She is CPAP greater than 4 hours 23 of the past 90 days for compliance of 26%.  Average usage on days used was 6 hours and 2 minutes.  Residual AHI was 0.7 on 7 to 14 cm of water and an EPR of 3.  There was no significant leak noted.  HISTORY: (copied from my note on 07/09/6268)  Amanda Mcintosh is a 49 y.o. female here today for follow up of OSA on CPAP.  She reports doing very well with CPAP therapy.  She has noted that she is grinding her teeth less.  Compliance download dated 05/25/2019 through 06/23/2019 reveals that she is using her machine 28 out of the last 30 days for compliance of 93%.  28 days were used for greater than 4 hours for compliance of 93%.  Average usage was 6 hours and 1 minute.  AHI was 0.5 on 7 to 14 cm of water and an EPR level of 3.  There was no significant leak.  She returns today for evaluation.   History (copied from Dr Guadelupe Sabin note on 03/03/2019)  Dear Amanda Mcintosh,   I saw your patient, Amanda Mcintosh, upon your kind request in my sleep clinic today for initial consultation of her sleep disorder, in particular, concern for underlying obstructive sleep apnea. The patient is unaccompanied today. As you  know, Amanda Mcintosh is a 49 year old right-handed woman with an underlying medical history of reflux disease, vitamin D deficiency, smoking, headaches, dizziness, seasonal allergies, chronic sinusitis, and morbid obesity with a BMI of over 40, who reports snoring and excessive daytime somnolence as well as witnessed apneas per family's report. I reviewed your office note from 01/28/2019. She had a home sleep test about 4-1/2 years ago and I reviewed the results. Test date was 09/14/2014, overall AHI was 9.2 per hour, O2 nadir was 81%. She weighed 231 pounds at the time, BMI of 38. Her Epworth sleepiness score is 7 out of 24 today, fatigue score is 18 out of 63. She's not sleeping well. She reports a bedtime of 9 PM, some difficulty falling asleep. She does turn her bedroom TV off at 9 PM. She has a rise time of 6 AM. No night to night nocturia, occasional AM HAs, does have nasal congestion. Mom had OSA.  She had T/A as a child, no asthma.See Dr. Erik Obey for sinus d/s. She lives with her husband, she has 1 child. She works for [employer]. She smokes one pack per day, does not utilize alcohol typically and does not drink caffeine on a regular basis.  She is fasting from her church, has an appointment for FU in wt management soon.  She has seen an endocrinologist. She has seen an integrative health provider. She has had stress, lost her mom 2 years ago. Recently lost a friend.   REVIEW OF SYSTEMS: Out of a complete 14 system review of symptoms, the patient complains only of the following symptoms, snoring and all other reviewed systems are negative.  ALLERGIES: Allergies  Allergen Reactions  . Gadolinium Derivatives Other (See Comments)    PT STARTED SHAKING AND FELT VERY COLD INTERNALLY BUT FACE WAS VERY FLUSHED. THIS WAS A DELAYED REACTION (ABOUT 1 HOUR AFTER INJECTION). SHE TOOK BENADRYL AND HYDRATED AND SYMPTOMS SUBSIDED    HOME MEDICATIONS: Outpatient Medications Prior to Visit  Medication Sig Dispense  Refill  . Ascorbic Acid (VITAMIN C) 1000 MG tablet Take 1,000 mg by mouth daily.    . cholecalciferol (VITAMIN D3) 25 MCG (1000 UT) tablet Take 1,000 Units by mouth daily.    . cyclobenzaprine (FLEXERIL) 5 MG tablet Take 1 tablet (5 mg total) by mouth 2 (two) times daily as needed for muscle spasms. 20 tablet 0  . ferrous sulfate 325 (65 FE) MG EC tablet Take 325 mg by mouth 3 (three) times a week.    . hydrochlorothiazide (HYDRODIURIL) 25 MG tablet Take 1 tablet (25 mg total) by mouth daily. 90 tablet 0  . meloxicam (MOBIC) 15 MG tablet Take 1 tablet (15 mg total) by mouth daily. 30 tablet 0  . methocarbamol (ROBAXIN) 500 MG tablet Take 1 tablet (500 mg total) by mouth at bedtime as needed for muscle spasms. 30 tablet 0  . Multiple Vitamin (MULTIVITAMIN WITH MINERALS) TABS tablet Take 1 tablet by mouth daily.    . pantoprazole (PROTONIX) 40 MG tablet Take 1 tablet (40 mg total) by mouth daily. (Patient taking differently: Take 40 mg by mouth daily as needed (heart burn). ) 90 tablet 0  . Turmeric 500 MG TABS Take 500 mg by mouth daily.    . vitamin B-12 (CYANOCOBALAMIN) 100 MCG tablet Take 100 mcg by mouth daily.    . Vitamin D, Ergocalciferol, (DRISDOL) 1.25 MG (50000 UT) CAPS capsule Take 1 capsule (50,000 Units total) by mouth every 7 (seven) days. 4 capsule 0  . vitamin E 1000 UNIT capsule Take 1,000 Units by mouth daily.    . vitamin k 100 MCG tablet Take 100 mcg by mouth daily.    . Zinc 50 MG CAPS Take by mouth.     No facility-administered medications prior to visit.    PAST MEDICAL HISTORY: Past Medical History:  Diagnosis Date  . Fibroids   . Heartburn   . History of headache   . Obesity   . Thyroid condition   . Vitamin D deficiency     PAST SURGICAL HISTORY: Past Surgical History:  Procedure Laterality Date  . CHOLECYSTECTOMY  2006  . ROBOT ASSISTED MYOMECTOMY  2011  . TONSILLECTOMY    . WISDOM TOOTH EXTRACTION      FAMILY HISTORY: Family History  Problem  Relation Age of Onset  . Diabetes Mother   . Hypertension Mother   . Heart failure Mother   . Stroke Mother   . High Cholesterol Mother   . Depression Mother   . Anxiety disorder Mother   . Sleep apnea Mother   . Obesity Mother   . Breast cancer Sister   . Skin cancer Sister   . COPD Father        smoker  . High blood pressure Father   . High Cholesterol Father   .  HIV/AIDS Brother     SOCIAL HISTORY: Social History   Socioeconomic History  . Marital status: Married    Spouse name: Amanda Mcintosh  . Number of children: 1  . Years of education: Not on file  . Highest education level: Not on file  Occupational History  . Occupation: Holiday representative: [employer]  Tobacco Use  . Smoking status: Former Smoker    Packs/day: 0.30    Types: Cigarettes    Quit date: 12/01/2019    Years since quitting: 0.5  . Smokeless tobacco: Never Used  Substance and Sexual Activity  . Alcohol use: No  . Drug use: No  . Sexual activity: Not on file  Other Topics Concern  . Not on file  Social History Narrative  . Not on file   Social Determinants of Health   Financial Resource Strain:   . Difficulty of Paying Living Expenses:   Food Insecurity:   . Worried About Charity fundraiser in the Last Year:   . Arboriculturist in the Last Year:   Transportation Needs:   . Film/video editor (Medical):   Marland Kitchen Lack of Transportation (Non-Medical):   Physical Activity:   . Days of Exercise per Week:   . Minutes of Exercise per Session:   Stress:   . Feeling of Stress :   Social Connections:   . Frequency of Communication with Friends and Family:   . Frequency of Social Gatherings with Friends and Family:   . Attends Religious Services:   . Active Member of Clubs or Organizations:   . Attends Archivist Meetings:   Marland Kitchen Marital Status:   Intimate Partner Violence:   . Fear of Current or Ex-Partner:   . Emotionally Abused:   Marland Kitchen Physically Abused:   . Sexually  Abused:       PHYSICAL EXAM  Vitals:   06/27/20 1259  BP: 132/89  Pulse: 63  Weight: 267 lb (121.1 kg)  Height: 5\' 5"  (1.651 m)   Body mass index is 44.43 kg/m.  Generalized: Well developed, in no acute distress  Cardiology: normal rate and rhythm, no murmur noted Respiratory: clear to auscultation bilaterally  Neurological examination  Mentation: Alert oriented to time, place, history taking. Follows all commands speech and language fluent Cranial nerve II-XII: Pupils were equal round reactive to light. Extraocular movements were full, visual field were full Motor: The motor testing reveals 5 over 5 strength of all 4 extremities. Good symmetric motor tone is noted throughout.  Gait and station: Gait is normal.   DIAGNOSTIC DATA (LABS, IMAGING, TESTING) - I reviewed patient records, labs, notes, testing and imaging myself where available.  No flowsheet data found.   Lab Results  Component Value Date   WBC 9.1 04/05/2020   HGB 13.2 04/05/2020   HCT 39.6 04/05/2020   MCV 88.3 04/05/2020   PLT 367.0 04/05/2020      Component Value Date/Time   NA 138 04/05/2020 1039   K 4.0 04/05/2020 1039   CL 105 04/05/2020 1039   CO2 26 04/05/2020 1039   GLUCOSE 101 (H) 04/05/2020 1039   BUN 16 04/05/2020 1039   CREATININE 0.76 04/05/2020 1039   CALCIUM 8.9 04/05/2020 1039   PROT 7.0 04/05/2020 1039   ALBUMIN 4.1 04/05/2020 1039   AST 15 04/05/2020 1039   ALT 22 04/05/2020 1039   ALKPHOS 74 04/05/2020 1039   BILITOT 0.4 04/05/2020 1039   GFRNONAA >60 02/03/2019 0013  GFRAA >60 02/03/2019 0013   Lab Results  Component Value Date   CHOL 204 (H) 10/01/2018   HDL 29.80 (L) 10/01/2018   LDLCALC 135 (H) 10/01/2018   LDLDIRECT 139.0 09/06/2016   TRIG 199.0 (H) 10/01/2018   CHOLHDL 7 10/01/2018   Lab Results  Component Value Date   HGBA1C 5.6 04/05/2020   Lab Results  Component Value Date   VITAMINB12 491 09/06/2016   Lab Results  Component Value Date   TSH 1.13  04/05/2020       ASSESSMENT AND PLAN 49 y.o. year old female  has a past medical history of Fibroids, Heartburn, History of headache, Obesity, Thyroid condition, and Vitamin D deficiency. here with     ICD-10-CM   1. OSA on CPAP  G47.33 For home use only DME continuous positive airway pressure (CPAP)   Z99.89     Patient is doing well today.  She admits that she has not been as compliant as she should be with CPAP therapy.  She does wish to resume therapy.  I have encouraged her to use CPAP nightly and for greater than 4 hours each night.  She will continue close follow-up with primary care for weight management.  I have commended her on smoking cessation.  I have encouraged healthy well-balanced diet and regular exercise.  She will follow-up with me in 3 months, sooner if needed.  She verbalizes understanding and agreement with this plan.   Orders Placed This Encounter  Procedures  . For home use only DME continuous positive airway pressure (CPAP)    supplies    Order Specific Question:   Length of Need    Answer:   Lifetime    Order Specific Question:   Patient has OSA or probable OSA    Answer:   Yes    Order Specific Question:   Is the patient currently using CPAP in the home    Answer:   Yes    Order Specific Question:   Settings    Answer:   Other see comments    Order Specific Question:   CPAP supplies needed    Answer:   Mask, headgear, cushions, filters, heated tubing and water chamber     No orders of the defined types were placed in this encounter.     I spent 15 minutes with the patient. 50% of this time was spent counseling and educating patient on plan of care and medications.    Debbora Presto, FNP-C 06/27/2020, 1:30 PM [facility] [address], [place], [place] [address] [phone]  I reviewed the above note and documentation by the Nurse Practitioner and agree with the history, exam, assessment and plan as outlined above. I was  available for consultation. [name] Age, MD, PhD [facility])

## 2020-06-28 ENCOUNTER — Other Ambulatory Visit: Payer: Self-pay | Admitting: Family

## 2020-07-13 ENCOUNTER — Other Ambulatory Visit: Payer: Self-pay

## 2020-07-13 ENCOUNTER — Encounter: Payer: Self-pay | Admitting: Family

## 2020-07-13 ENCOUNTER — Ambulatory Visit (INDEPENDENT_AMBULATORY_CARE_PROVIDER_SITE_OTHER): Payer: No Typology Code available for payment source | Admitting: Family

## 2020-07-13 ENCOUNTER — Ambulatory Visit (INDEPENDENT_AMBULATORY_CARE_PROVIDER_SITE_OTHER)
Admission: RE | Admit: 2020-07-13 | Discharge: 2020-07-13 | Disposition: A | Discharge status: Home / Self Care | Payer: No Typology Code available for payment source | Source: Ambulatory Visit | Attending: Family | Admitting: Family

## 2020-07-13 VITALS — BP 118/90 | HR 72 | Temp 98.7°F | Wt 274.6 lb

## 2020-07-13 DIAGNOSIS — M542 Cervicalgia: Secondary | ICD-10-CM

## 2020-07-13 DIAGNOSIS — R519 Headache, unspecified: Secondary | ICD-10-CM | POA: Diagnosis not present

## 2020-07-13 DIAGNOSIS — I10 Essential (primary) hypertension: Secondary | ICD-10-CM | POA: Diagnosis not present

## 2020-07-13 MED ORDER — MELOXICAM 15 MG PO TABS: 15.0000 mg | ORAL_TABLET | Freq: Every day | ORAL | 0 refills | Status: DC

## 2020-07-13 MED ORDER — CYCLOBENZAPRINE HCL 5 MG PO TABS: 5.0000 mg | ORAL_TABLET | Freq: Three times a day (TID) | ORAL | 0 refills | Status: DC | PRN

## 2020-07-13 MED ORDER — HYDROCHLOROTHIAZIDE 25 MG PO TABS: 25.0000 mg | ORAL_TABLET | Freq: Every day | ORAL | 0 refills | Status: DC
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
## 2020-07-18 ENCOUNTER — Inpatient Hospital Stay: Admission: RE | Admit: 2020-07-18 | Payer: No Typology Code available for payment source | Source: Ambulatory Visit

## 2020-08-29 ENCOUNTER — Other Ambulatory Visit: Payer: Self-pay | Admitting: Family

## 2020-09-16 ENCOUNTER — Ambulatory Visit: Payer: No Typology Code available for payment source | Admitting: Family

## 2020-09-28 ENCOUNTER — Encounter: Payer: Self-pay | Admitting: Family Medicine

## 2020-09-28 ENCOUNTER — Other Ambulatory Visit: Payer: Self-pay

## 2020-09-28 ENCOUNTER — Ambulatory Visit (INDEPENDENT_AMBULATORY_CARE_PROVIDER_SITE_OTHER): Payer: No Typology Code available for payment source | Admitting: Family Medicine

## 2020-09-28 VITALS — BP 123/70 | HR 65 | Ht 65.0 in | Wt 273.0 lb

## 2020-09-28 DIAGNOSIS — Z9989 Dependence on other enabling machines and devices: Secondary | ICD-10-CM

## 2020-09-28 DIAGNOSIS — G4733 Obstructive sleep apnea (adult) (pediatric): Secondary | ICD-10-CM | POA: Diagnosis not present

## 2020-09-28 NOTE — Progress Notes (Signed)
Order for cpap supplies sent to [facility] via community msg. Confirmation received that the order transmitted was successful.  

## 2020-09-28 NOTE — Patient Instructions (Signed)
Please continue using your CPAP regularly. While your insurance requires that you use CPAP at least 4 hours each night on 70% of the nights, I recommend, that you not skip any nights and use it throughout the night if you can. Getting used to CPAP and staying with the treatment long term does take time and patience and discipline. Untreated obstructive sleep apnea when it is moderate to severe can have an adverse impact on cardiovascular health and raise her risk for heart disease, arrhythmias, hypertension, congestive heart failure, stroke and diabetes. Untreated obstructive sleep apnea causes sleep disruption, nonrestorative sleep, and sleep deprivation. This can have an impact on your day to day functioning and cause daytime sleepiness and impairment of cognitive function, memory loss, mood disturbance, and problems focussing. Using CPAP regularly can improve these symptoms.   Follow up in 6 months    Sleep Apnea Sleep apnea affects breathing during sleep. It causes breathing to stop for a short time or to become shallow. It can also increase the risk of:  Heart attack.  Stroke.  Being very overweight (obese).  Diabetes.  Heart failure.  Irregular heartbeat. The goal of treatment is to help you breathe normally again. What are the causes? There are three kinds of sleep apnea:  Obstructive sleep apnea. This is caused by a blocked or collapsed airway.  Central sleep apnea. This happens when the brain does not send the right signals to the muscles that control breathing.  Mixed sleep apnea. This is a combination of obstructive and central sleep apnea. The most common cause of this condition is a collapsed or blocked airway. This can happen if:  Your throat muscles are too relaxed.  Your tongue and tonsils are too large.  You are overweight.  Your airway is too small. What increases the risk?  Being overweight.  Smoking.  Having a small airway.  Being older.  Being  female.  Drinking alcohol.  Taking medicines to calm yourself (sedatives or tranquilizers).  Having family members with the condition. What are the signs or symptoms?  Trouble staying asleep.  Being sleepy or tired during the day.  Getting angry a lot.  Loud snoring.  Headaches in the morning.  Not being able to focus your mind (concentrate).  Forgetting things.  Less interest in sex.  Mood swings.  Personality changes.  Feelings of sadness (depression).  Waking up a lot during the night to pee (urinate).  Dry mouth.  Sore throat. How is this diagnosed?  Your medical history.  A physical exam.  A test that is done when you are sleeping (sleep study). The test is most often done in a sleep lab but may also be done at home. How is this treated?   Sleeping on your side.  Using a medicine to get rid of mucus in your nose (decongestant).  Avoiding the use of alcohol, medicines to help you relax, or certain pain medicines (narcotics).  Losing weight, if needed.  Changing your diet.  Not smoking.  Using a machine to open your airway while you sleep, such as: ? An oral appliance. This is a mouthpiece that shifts your lower jaw forward. ? A CPAP device. This device blows air through a mask when you breathe out (exhale). ? An EPAP device. This has valves that you put in each nostril. ? A BPAP device. This device blows air through a mask when you breathe in (inhale) and breathe out.  Having surgery if other treatments do not work. It   is important to get treatment for sleep apnea. Without treatment, it can lead to:  High blood pressure.  Coronary artery disease.  In men, not being able to have an erection (impotence).  Reduced thinking ability. Follow these instructions at home: Lifestyle  Make changes that your doctor recommends.  Eat a healthy diet.  Lose weight if needed.  Avoid alcohol, medicines to help you relax, and some pain  medicines.  Do not use any products that contain nicotine or tobacco, such as cigarettes, e-cigarettes, and chewing tobacco. If you need help quitting, ask your doctor. General instructions  Take over-the-counter and prescription medicines only as told by your doctor.  If you were given a machine to use while you sleep, use it only as told by your doctor.  If you are having surgery, make sure to tell your doctor you have sleep apnea. You may need to bring your device with you.  Keep all follow-up visits as told by your doctor. This is important. Contact a doctor if:  The machine that you were given to use during sleep bothers you or does not seem to be working.  You do not get better.  You get worse. Get help right away if:  Your chest hurts.  You have trouble breathing in enough air.  You have an uncomfortable feeling in your back, arms, or stomach.  You have trouble talking.  One side of your body feels weak.  A part of your face is hanging down. These symptoms may be an emergency. Do not wait to see if the symptoms will go away. Get medical help right away. Call your local emergency services (911 in the [place] Do not drive yourself to the hospital. Summary  This condition affects breathing during sleep.  The most common cause is a collapsed or blocked airway.  The goal of treatment is to help you breathe normally while you sleep. This information is not intended to replace advice given to you by your health care provider. Make sure you discuss any questions you have with your health care provider. Document Revised: 10/03/2018 Document Reviewed: 08/12/2018 Elsevier Patient Education  2020 [employer] Inc[employer]  

## 2020-09-28 NOTE — Progress Notes (Addendum)
PATIENT: Amanda Mcintosh DOB: 1971/07/24  REASON FOR VISIT: follow up HISTORY FROM: patient  Chief Complaint  Patient presents with  . Follow-up    rm 1  . Sleep Apnea    pt said she is having no new concerns     HISTORY OF PRESENT ILLNESS: Today 19/50/93 Amanda Mcintosh is a 49 y.o. female here today for follow up for OSA on CPAP. She is doing well. She admits there are days when she doesn't use CPAP. She went out of town this past weekend and did not use her machine. She does note benefit of using CPAP.  She wakes feeling well rested.   Compliance report dated 06/25/2020 through 09/22/2020 reveals that she used CPAP 69 of the past 90 days for compliance of 77%.  She used CPAP greater than 4 hours 64 of the past 90 days for compliance of 71%.  Average usage was 6 hours and 55 minutes.  Residual AHI was 0.6 on 7 to 14 cm of water and an EPR of three.  There was no significant leak noted.  HISTORY: (copied from my note on 2/67/1245)  MAYANNA Mcintosh is a 49 y.o. female here today for follow up for OSA on CPAP. She admits that she has not been compliant with CPAP usage recently. She has gone on several vacations and not taken her machine. She knows that she needs to use it. No other concerns. She has recently quit smoking. She is working on [employer].   Compliance report dated 03/17/2020 through 06/14/2020 reveals that she used CPAP 26 of the last 90 days for compliance of 29%.  She is CPAP greater than 4 hours 23 of the past 90 days for compliance of 26%.  Average usage on days used was 6 hours and 2 minutes.  Residual AHI was 0.7 on 7 to 14 cm of water and an EPR of 3.  There was no significant leak noted.  HISTORY: (copied from my note on 08/08/9832)  Shantrell G Stallingsis a 49 y.o.femalehere today for follow up of OSA on CPAP.She reports doing very well with CPAP therapy. She has noted that she is grinding her teeth less. Compliance download dated 05/25/2019  through 06/23/2019 reveals that she is using her machine 28 out of the last 30 days for compliance of 93%. 28 days were used for greater than 4 hours for compliance of 93%. Average usage was 6 hours and 1 minute. AHI was 0.5 on 7 to 14 cm of water and an EPR level of 3. There was no significant leak. She returns today for evaluation.   History (copied from Dr Guadelupe Sabin note on 03/03/2019)  Dear Mickel Baas,   I saw your patient, Amanda Mcintosh, upon your kind request in my sleep clinic today for initial consultation of her sleep disorder, in particular, concern for underlying obstructive sleep apnea. The patient is unaccompanied today. As you know, Amanda Mcintosh is a 49 year old right-handed woman with an underlying medical history of reflux disease, vitamin D deficiency, smoking, headaches, dizziness, seasonal allergies, chronic sinusitis, and morbid obesity with a BMI of over 40, who reports snoring and excessive daytime somnolence as well as witnessed apneas per family's report. I reviewed your office note from 01/28/2019. She had a home sleep test about 4-1/2 years ago and I reviewed the results. Test date was 09/14/2014, overall AHI was 9.2 per hour, O2 nadir was 81%. She weighed 231 pounds at the time, BMI of 38. Her Epworth sleepiness  score is 7 out of 24 today, fatigue score is 18 out of 63. She's not sleeping well. She reports a bedtime of 9 PM, some difficulty falling asleep. She does turn her bedroom TV off at 9 PM. She has a rise time of 6 AM. No night to night nocturia, occasional AM HAs, does have nasal congestion. Mom had OSA.  She had T/A as a child, no asthma.See Dr. Erik Obey for sinus d/s. She lives with her husband, she has 1 child. She works for [employer]. She smokes one pack per day, does not utilize alcohol typically and does not drink caffeine on a regular basis.  She is fasting from her church, has an appointment for FU in wt management soon. She has seen an endocrinologist. She has  seen an integrative health provider. She has had stress, lost her mom 2 years ago. Recently lost a friend.   REVIEW OF SYSTEMS: Out of a complete 14 system review of symptoms, the patient complains only of the following symptoms, none and all other reviewed systems are negative.  ESS: 3 FSS:23  ALLERGIES: Allergies  Allergen Reactions  . Gadolinium Derivatives Other (See Comments)    PT STARTED SHAKING AND FELT VERY COLD INTERNALLY BUT FACE WAS VERY FLUSHED. THIS WAS A DELAYED REACTION (ABOUT 1 HOUR AFTER INJECTION). SHE TOOK BENADRYL AND HYDRATED AND SYMPTOMS SUBSIDED    HOME MEDICATIONS: Outpatient Medications Prior to Visit  Medication Sig Dispense Refill  . Ascorbic Acid (VITAMIN C) 1000 MG tablet Take 1,000 mg by mouth daily.    . cholecalciferol (VITAMIN D3) 25 MCG (1000 UT) tablet Take 1,000 Units by mouth daily.    . hydrochlorothiazide (HYDRODIURIL) 25 MG tablet Take 1 tablet (25 mg total) by mouth daily. 90 tablet 0  . Multiple Vitamin (MULTIVITAMIN WITH MINERALS) TABS tablet Take 1 tablet by mouth daily.     . Turmeric 500 MG TABS Take 500 mg by mouth daily.    . vitamin B-12 (CYANOCOBALAMIN) 100 MCG tablet Take 100 mcg by mouth daily.    . vitamin E 1000 UNIT capsule Take 1,000 Units by mouth daily.    . vitamin k 100 MCG tablet Take 100 mcg by mouth daily.    . Zinc 50 MG CAPS Take by mouth.    . cyclobenzaprine (FLEXERIL) 5 MG tablet Take 1 tablet (5 mg total) by mouth 3 (three) times daily as needed for muscle spasms. 30 tablet 0  . ferrous sulfate 325 (65 FE) MG EC tablet Take 325 mg by mouth 3 (three) times a week. (Patient not taking: Reported on 07/13/2020)    . meloxicam (MOBIC) 15 MG tablet TAKE 1 TABLET BY MOUTH EVERY DAY 30 tablet 0   No facility-administered medications prior to visit.    PAST MEDICAL HISTORY: Past Medical History:  Diagnosis Date  . Fibroids   . Heartburn   . History of headache   . Obesity   . Thyroid condition   . Vitamin D  deficiency     PAST SURGICAL HISTORY: Past Surgical History:  Procedure Laterality Date  . CHOLECYSTECTOMY  2006  . ROBOT ASSISTED MYOMECTOMY  2011  . TONSILLECTOMY    . WISDOM TOOTH EXTRACTION      FAMILY HISTORY: Family History  Problem Relation Age of Onset  . Diabetes Mother   . Hypertension Mother   . Heart failure Mother   . Stroke Mother   . High Cholesterol Mother   . Depression Mother   . Anxiety disorder Mother   .  Sleep apnea Mother   . Obesity Mother   . Breast cancer Sister   . Skin cancer Sister   . COPD Father        smoker  . High blood pressure Father   . High Cholesterol Father   . HIV/AIDS Brother     SOCIAL HISTORY: Social History   Socioeconomic History  . Marital status: Married    Spouse name: Darryl  . Number of children: 1  . Years of education: Not on file  . Highest education level: Not on file  Occupational History  . Occupation: Holiday representative: [employer]  Tobacco Use  . Smoking status: Former Smoker    Packs/day: 0.30    Types: Cigarettes    Quit date: 12/01/2019    Years since quitting: 0.8  . Smokeless tobacco: Never Used  Substance and Sexual Activity  . Alcohol use: No  . Drug use: No  . Sexual activity: Not on file  Other Topics Concern  . Not on file  Social History Narrative  . Not on file   Social Determinants of Health   Financial Resource Strain:   . Difficulty of Paying Living Expenses: Not on file  Food Insecurity:   . Worried About Charity fundraiser in the Last Year: Not on file  . Ran Out of Food in the Last Year: Not on file  Transportation Needs:   . Lack of Transportation (Medical): Not on file  . Lack of Transportation (Non-Medical): Not on file  Physical Activity:   . Days of Exercise per Week: Not on file  . Minutes of Exercise per Session: Not on file  Stress:   . Feeling of Stress : Not on file  Social Connections:   . Frequency of Communication with Friends and  Family: Not on file  . Frequency of Social Gatherings with Friends and Family: Not on file  . Attends Religious Services: Not on file  . Active Member of Clubs or Organizations: Not on file  . Attends Archivist Meetings: Not on file  . Marital Status: Not on file  Intimate Partner Violence:   . Fear of Current or Ex-Partner: Not on file  . Emotionally Abused: Not on file  . Physically Abused: Not on file  . Sexually Abused: Not on file      PHYSICAL EXAM  Vitals:   09/28/20 1330  BP: 123/70  Pulse: 65  Weight: 273 lb (123.8 kg)  Height: 5\' 5"  (1.651 m)   Body mass index is 45.43 kg/m.  Generalized: Well developed, in no acute distress  Neurological examination  Mentation: Alert oriented to time, place, history taking. Follows all commands speech and language fluent Cranial nerve II-XII: Pupils were equal round reactive to light. Extraocular movements were full, visual field were full  Motor: The motor testing reveals 5 over 5 strength of all 4 extremities. Good symmetric motor tone is noted throughout.  Gait and station: Gait is normal.    DIAGNOSTIC DATA (LABS, IMAGING, TESTING) - I reviewed patient records, labs, notes, testing and imaging myself where available.  No flowsheet data found.   Lab Results  Component Value Date   WBC 9.1 04/05/2020   HGB 13.2 04/05/2020   HCT 39.6 04/05/2020   MCV 88.3 04/05/2020   PLT 367.0 04/05/2020      Component Value Date/Time   NA 138 04/05/2020 1039   K 4.0 04/05/2020 1039   CL 105 04/05/2020 1039  CO2 26 04/05/2020 1039   GLUCOSE 101 (H) 04/05/2020 1039   BUN 16 04/05/2020 1039   CREATININE 0.76 04/05/2020 1039   CALCIUM 8.9 04/05/2020 1039   PROT 7.0 04/05/2020 1039   ALBUMIN 4.1 04/05/2020 1039   AST 15 04/05/2020 1039   ALT 22 04/05/2020 1039   ALKPHOS 74 04/05/2020 1039   BILITOT 0.4 04/05/2020 1039   GFRNONAA >60 02/03/2019 0013   GFRAA >60 02/03/2019 0013   Lab Results  Component Value Date    CHOL 204 (H) 10/01/2018   HDL 29.80 (L) 10/01/2018   LDLCALC 135 (H) 10/01/2018   LDLDIRECT 139.0 09/06/2016   TRIG 199.0 (H) 10/01/2018   CHOLHDL 7 10/01/2018   Lab Results  Component Value Date   HGBA1C 5.6 04/05/2020   Lab Results  Component Value Date   VITAMINB12 491 09/06/2016   Lab Results  Component Value Date   TSH 1.13 04/05/2020       ASSESSMENT AND PLAN 49 y.o. year old female  has a past medical history of Fibroids, Heartburn, History of headache, Obesity, Thyroid condition, and Vitamin D deficiency. here with     ICD-10-CM   1. OSA on CPAP  G47.33 For home use only DME continuous positive airway pressure (CPAP)   C14.48      Carrah continues to work on CPAP compliance. Compliance report looking at past 90 days shows 77% daily compliance and 71% 4 hour usage. She was encouraged to continue working toward goal of nightly usage for a minimum of 4 hours. She will continue follow up with PCP. Follow up with me in 6 months.    Orders Placed This Encounter  Procedures  . For home use only DME continuous positive airway pressure (CPAP)    Supplies    Order Specific Question:   Length of Need    Answer:   Lifetime    Order Specific Question:   Patient has OSA or probable OSA    Answer:   Yes    Order Specific Question:   Is the patient currently using CPAP in the home    Answer:   Yes    Order Specific Question:   Settings    Answer:   Other see comments    Order Specific Question:   CPAP supplies needed    Answer:   Mask, headgear, cushions, filters, heated tubing and water chamber     No orders of the defined types were placed in this encounter.     I spent 15 minutes with the patient. 50% of this time was spent counseling and educating patient on plan of care and medications.    Debbora Presto, FNP-C 09/28/2020, 1:51 PM [facility] [address], [place], [place] [address] [phone]  I reviewed the above note and  documentation by the Nurse Practitioner and agree with the history, exam, assessment and plan as outlined above. I was available for consultation. [name] Age, MD, PhD [facility])

## 2020-10-10 LAB — HM COLONOSCOPY

## 2020-11-29 ENCOUNTER — Ambulatory Visit (INDEPENDENT_AMBULATORY_CARE_PROVIDER_SITE_OTHER): Payer: No Typology Code available for payment source | Admitting: Family

## 2020-11-29 ENCOUNTER — Other Ambulatory Visit: Payer: Self-pay

## 2020-11-29 ENCOUNTER — Encounter: Payer: Self-pay | Admitting: Family

## 2020-11-29 VITALS — BP 155/98 | HR 79 | Temp 98.0°F | Ht 65.0 in | Wt 265.0 lb

## 2020-11-29 DIAGNOSIS — I10 Essential (primary) hypertension: Secondary | ICD-10-CM

## 2020-11-29 DIAGNOSIS — R42 Dizziness and giddiness: Secondary | ICD-10-CM

## 2020-11-29 DIAGNOSIS — R519 Headache, unspecified: Secondary | ICD-10-CM

## 2020-11-29 DIAGNOSIS — R5383 Other fatigue: Secondary | ICD-10-CM | POA: Diagnosis not present

## 2020-11-29 MED ORDER — LOSARTAN POTASSIUM-HCTZ 100-25 MG PO TABS
1.0000 | ORAL_TABLET | Freq: Every day | ORAL | 0 refills | Status: DC
Start: 2020-11-29 — End: 2021-02-20

## 2020-11-29 NOTE — Progress Notes (Signed)
Amanda Mcintosh is a 49 y.o. female with the following history as recorded in EpicCare:  Patient Active Problem List   Diagnosis Date Noted  . OSA on CPAP 06/24/2019  . Low back pain 04/30/2019  . Dizziness 07/04/2017  . Chronic seasonal allergic rhinitis 01/25/2017  . Chronic sinusitis 01/25/2017  . Lateral epicondylitis 01/13/2016  . Amenorrhea 06/28/2015  . Situational syncope 05/07/2015  . GERD (gastroesophageal reflux disease) 04/21/2015  . Routine general medical examination at a health care facility 04/21/2015  . Unspecified vitamin D deficiency 12/02/2013  . Class 2 severe obesity with serious comorbidity and body mass index (BMI) of 38.0 to 38.9 in adult [facility]) 12/02/2013  . Iron deficiency anemia 12/02/2013  . Tobacco use disorder 12/02/2013  . Migraine without aura, with intractable migraine, so stated, without mention of status migrainosus 03/11/2013  . Family history of early CAD 09/08/2012    Current Outpatient Medications  Medication Sig Dispense Refill  . Ascorbic Acid (VITAMIN C) 1000 MG tablet Take 1,000 mg by mouth daily.    . cholecalciferol (VITAMIN D3) 25 MCG (1000 UT) tablet Take 1,000 Units by mouth daily.    . Multiple Vitamin (MULTIVITAMIN WITH MINERALS) TABS tablet Take 1 tablet by mouth daily.     . Turmeric 500 MG TABS Take 500 mg by mouth daily.    . vitamin B-12 (CYANOCOBALAMIN) 100 MCG tablet Take 100 mcg by mouth daily.    . vitamin E 1000 UNIT capsule Take 1,000 Units by mouth daily.    . vitamin k 100 MCG tablet Take 100 mcg by mouth daily.    . Zinc 50 MG CAPS Take by mouth.    . losartan-hydrochlorothiazide (HYZAAR) 100-25 MG tablet Take 1 tablet by mouth daily. 90 tablet 0   No current facility-administered medications for this visit.    Allergies: Gadolinium derivatives  Past Medical History:  Diagnosis Date  . Fibroids   . Heartburn   . History of headache   . Obesity   . Thyroid condition   . Vitamin D deficiency     Past  Surgical History:  Procedure Laterality Date  . CHOLECYSTECTOMY  2006  . ROBOT ASSISTED MYOMECTOMY  2011  . TONSILLECTOMY    . WISDOM TOOTH EXTRACTION      Family History  Problem Relation Age of Onset  . Diabetes Mother   . Hypertension Mother   . Heart failure Mother   . Stroke Mother   . High Cholesterol Mother   . Depression Mother   . Anxiety disorder Mother   . Sleep apnea Mother   . Obesity Mother   . Breast cancer Sister   . Skin cancer Sister   . COPD Father        smoker  . High blood pressure Father   . High Cholesterol Father   . HIV/AIDS Brother     Social History   Tobacco Use  . Smoking status: Former Smoker    Packs/day: 0.30    Types: Cigarettes    Quit date: 12/01/2019    Years since quitting: 0.9  . Smokeless tobacco: Never Used  Substance Use Topics  . Alcohol use: No    Subjective:   Patient presents with concerns for recurrent headaches/ "can't focus"- just don't feel well; requesting FMLA today for intermittent time off to help with her headaches; is taking her HCTZ 25 mg daily;  Has lost 8 pounds since last OV in September- feels like her appetite is down; has reached  out to her therapist to re-start; recently lost her uncle;  Admits her stress level is very high at work;     Objective:  Vitals:   11/29/20 1147  BP: (!) 155/98  Pulse: 79  Temp: 98 F (36.7 C)  TempSrc: Oral  SpO2: 98%  Weight: 265 lb (120.2 kg)  Height: 5\' 5"  (1.651 m)    General: Well developed, well nourished, in no acute distress  Head: Normocephalic and atraumatic  Eyes: Sclera and conjunctiva clear; pupils round and reactive to light; extraocular movements intact  Lungs: Respirations unlabored; clear to auscultation bilaterally without wheeze, rales, rhonchi  [employer] exam: normal rate and regular rhythm.  Neurologic: Alert and oriented; speech intact; face symmetrical; moves all extremities well; CNII-XII intact without focal deficit   Assessment:  1. Dizziness    2. Nonintractable headache, unspecified chronicity pattern, unspecified headache type   3. Other fatigue   4. Essential hypertension     Plan:  Suspect uncontrolled blood pressure is contributing to majority of symptoms; will change to Losartan HCT 100/25; discussed need to update labs today and patient defers until next OV; FMLA completed for patient as requested; Discussed that uncontrolled blood pressure can make underlying anxiety worse; encouraged to follow-up with her therapist as she has already planned to do ; will hold off starting medication at this time; Follow-up in 2-3 weeks for re-check;   Time spent 30 minutes  This visit occurred during the SARS-CoV-2 public health emergency.  Safety protocols were in place, including screening questions prior to the visit, additional usage of staff PPE, and extensive cleaning of exam room while observing appropriate contact time as indicated for disinfecting solutions.     No follow-ups on file.  No orders of the defined types were placed in this encounter.   Requested Prescriptions   Signed Prescriptions Disp Refills  . losartan-hydrochlorothiazide (HYZAAR) 100-25 MG tablet 90 tablet 0    Sig: Take 1 tablet by mouth daily.

## 2020-12-20 ENCOUNTER — Telehealth (INDEPENDENT_AMBULATORY_CARE_PROVIDER_SITE_OTHER): Payer: No Typology Code available for payment source | Admitting: Family

## 2020-12-20 ENCOUNTER — Other Ambulatory Visit: Payer: Self-pay

## 2020-12-20 DIAGNOSIS — Z20822 Contact with and (suspected) exposure to covid-19: Secondary | ICD-10-CM | POA: Diagnosis not present

## 2020-12-20 NOTE — Progress Notes (Signed)
Amanda Mcintosh is a 49 y.o. female with the following history as recorded in EpicCare:  Patient Active Problem List   Diagnosis Date Noted  . OSA on CPAP 06/24/2019  . Low back pain 04/30/2019  . Dizziness 07/04/2017  . Chronic seasonal allergic rhinitis 01/25/2017  . Chronic sinusitis 01/25/2017  . Lateral epicondylitis 01/13/2016  . Amenorrhea 06/28/2015  . Situational syncope 05/07/2015  . GERD (gastroesophageal reflux disease) 04/21/2015  . Routine general medical examination at a health care facility 04/21/2015  . Unspecified vitamin D deficiency 12/02/2013  . Class 2 severe obesity with serious comorbidity and body mass index (BMI) of 38.0 to 38.9 in adult [facility]) 12/02/2013  . Iron deficiency anemia 12/02/2013  . Tobacco use disorder 12/02/2013  . Migraine without aura, with intractable migraine, so stated, without mention of status migrainosus 03/11/2013  . Family history of early CAD 09/08/2012    Current Outpatient Medications  Medication Sig Dispense Refill  . Ascorbic Acid (VITAMIN C) 1000 MG tablet Take 1,000 mg by mouth daily.    . cholecalciferol (VITAMIN D3) 25 MCG (1000 UT) tablet Take 1,000 Units by mouth daily.    Marland Kitchen losartan-hydrochlorothiazide (HYZAAR) 100-25 MG tablet Take 1 tablet by mouth daily. 90 tablet 0  . Multiple Vitamin (MULTIVITAMIN WITH MINERALS) TABS tablet Take 1 tablet by mouth daily.     . Turmeric 500 MG TABS Take 500 mg by mouth daily.    . vitamin B-12 (CYANOCOBALAMIN) 100 MCG tablet Take 100 mcg by mouth daily.    . vitamin E 1000 UNIT capsule Take 1,000 Units by mouth daily.    . vitamin k 100 MCG tablet Take 100 mcg by mouth daily.    . Zinc 50 MG CAPS Take by mouth.     No current facility-administered medications for this visit.    Allergies: Gadolinium derivatives  Past Medical History:  Diagnosis Date  . Fibroids   . Heartburn   . History of headache   . Obesity   . Thyroid condition   . Vitamin D deficiency     Past  Surgical History:  Procedure Laterality Date  . CHOLECYSTECTOMY  2006  . ROBOT ASSISTED MYOMECTOMY  2011  . TONSILLECTOMY    . WISDOM TOOTH EXTRACTION      Family History  Problem Relation Age of Onset  . Diabetes Mother   . Hypertension Mother   . Heart failure Mother   . Stroke Mother   . High Cholesterol Mother   . Depression Mother   . Anxiety disorder Mother   . Sleep apnea Mother   . Obesity Mother   . Breast cancer Sister   . Skin cancer Sister   . COPD Father        smoker  . High blood pressure Father   . High Cholesterol Father   . HIV/AIDS Brother     Social History   Tobacco Use  . Smoking status: Former Smoker    Packs/day: 0.30    Types: Cigarettes    Quit date: 12/01/2019    Years since quitting: 1.0  . Smokeless tobacco: Never Used  Substance Use Topics  . Alcohol use: No    Subjective:    I connected with Amanda Mcintosh on 12/20/20 at 11:40 AM EST by a telephone call and verified that I am speaking with the correct person using two identifiers.   I discussed the limitations of evaluation and management by telemedicine and the availability of in person appointments. The  patient expressed understanding and agreed to proceed.  Patient was exposed to COVID 2 days ago- notes that both of her in-laws have tested positive; she took rapid test which was negative but is requesting send out test for reassurance; denies any symptoms- no fever or cough; does have a headache but notes, "I always have a headache."   Objective:  There were no vitals filed for this visit.  Lungs: Respirations unlabored;  Neurologic: Alert and oriented; speech intact;   Assessment:  1. Exposure to COVID-19 virus     Plan:  She will be scheduled for COVID testing today at our office;  In person visit to follow-up on her blood pressure will need to be re-scheduled for at least 2 weeks out;  Time spent 6 minutes  No follow-ups on file.  Orders Placed This Encounter   Procedures  . Novel Coronavirus, NAA ([facility])    Order Specific Question:   Is this test for diagnosis or screening    Answer:   Diagnosis of ill patient    Order Specific Question:   Symptomatic for COVID-19 as defined by CDC    Answer:   No    Order Specific Question:   Hospitalized for COVID-19    Answer:   No    Order Specific Question:   Admitted to ICU for COVID-19    Answer:   No    Order Specific Question:   Previously tested for COVID-19    Answer:   No    Order Specific Question:   Resident in a congregate (group) care setting    Answer:   No    Order Specific Question:   Is the patient student?    Answer:   No    Order Specific Question:   Employed in healthcare setting    Answer:   No    Order Specific Question:   Pregnant    Answer:   No    Order Specific Question:   Has patient completed COVID vaccination(s) (2 doses of Pfizer/Moderna 1 dose of [employer])    Answer:   Yes    Requested Prescriptions    No prescriptions requested or ordered in this encounter

## 2020-12-23 LAB — NOVEL CORONAVIRUS, NAA: SARS-CoV-2, NAA: NOT DETECTED

## 2021-02-19 ENCOUNTER — Other Ambulatory Visit: Payer: Self-pay | Admitting: Family

## 2021-02-20 ENCOUNTER — Other Ambulatory Visit: Payer: Self-pay | Admitting: Family

## 2021-02-20 NOTE — Telephone Encounter (Signed)
Losartan and Losartan HCT are indefinite national backorder; will need to call in alternative for her blood pressure medication; will send in Diovan HCT 160/25; she needs a blood pressure check in about 1 month please.

## 2021-02-20 NOTE — Telephone Encounter (Signed)
Pt notified of medication change & request for f/u BP check.  Appt made for 03/20/21.

## 2021-03-20 ENCOUNTER — Ambulatory Visit: Payer: No Typology Code available for payment source | Admitting: Family

## 2021-03-28 ENCOUNTER — Encounter: Payer: Self-pay | Admitting: Family Medicine

## 2021-03-28 ENCOUNTER — Other Ambulatory Visit: Payer: Self-pay

## 2021-03-28 ENCOUNTER — Ambulatory Visit (INDEPENDENT_AMBULATORY_CARE_PROVIDER_SITE_OTHER): Payer: No Typology Code available for payment source | Admitting: Family Medicine

## 2021-03-28 VITALS — BP 118/79 | HR 70 | Ht 65.0 in | Wt 264.0 lb

## 2021-03-28 DIAGNOSIS — G4733 Obstructive sleep apnea (adult) (pediatric): Secondary | ICD-10-CM | POA: Diagnosis not present

## 2021-03-28 DIAGNOSIS — Z9989 Dependence on other enabling machines and devices: Secondary | ICD-10-CM

## 2021-03-28 NOTE — Patient Instructions (Signed)
Please continue using your CPAP regularly. While your insurance requires that you use CPAP at least 4 hours each night on 70% of the nights, I recommend, that you not skip any nights and use it throughout the night if you can. Getting used to CPAP and staying with the treatment long term does take time and patience and discipline. Untreated obstructive sleep apnea when it is moderate to severe can have an adverse impact on cardiovascular health and raise her risk for heart disease, arrhythmias, hypertension, congestive heart failure, stroke and diabetes. Untreated obstructive sleep apnea causes sleep disruption, nonrestorative sleep, and sleep deprivation. This can have an impact on your day to day functioning and cause daytime sleepiness and impairment of cognitive function, memory loss, mood disturbance, and problems focussing. Using CPAP regularly can improve these symptoms.   Continue working toward your goals. Consider low carb diet. Consider weight management referral. Continue CPAP and work on compliance.   Follow up with me in 1 year   Sleep Apnea Sleep apnea affects breathing during sleep. It causes breathing to stop for a short time or to become shallow. It can also increase the risk of:  Heart attack.  Stroke.  Being very overweight (obese).  Diabetes.  Heart failure.  Irregular heartbeat. The goal of treatment is to help you breathe normally again. What are the causes? There are three kinds of sleep apnea:  Obstructive sleep apnea. This is caused by a blocked or collapsed airway.  Central sleep apnea. This happens when the brain does not send the right signals to the muscles that control breathing.  Mixed sleep apnea. This is a combination of obstructive and central sleep apnea. The most common cause of this condition is a collapsed or blocked airway. This can happen if:  Your throat muscles are too relaxed.  Your tongue and tonsils are too large.  You are  overweight.  Your airway is too small.   What increases the risk?  Being overweight.  Smoking.  Having a small airway.  Being older.  Being female.  Drinking alcohol.  Taking medicines to calm yourself (sedatives or tranquilizers).  Having family members with the condition. What are the signs or symptoms?  Trouble staying asleep.  Being sleepy or tired during the day.  Getting angry a lot.  Loud snoring.  Headaches in the morning.  Not being able to focus your mind (concentrate).  Forgetting things.  Less interest in sex.  Mood swings.  Personality changes.  Feelings of sadness (depression).  Waking up a lot during the night to pee (urinate).  Dry mouth.  Sore throat. How is this diagnosed?  Your medical history.  A physical exam.  A test that is done when you are sleeping (sleep study). The test is most often done in a sleep lab but may also be done at home. How is this treated?  Sleeping on your side.  Using a medicine to get rid of mucus in your nose (decongestant).  Avoiding the use of alcohol, medicines to help you relax, or certain pain medicines (narcotics).  Losing weight, if needed.  Changing your diet.  Not smoking.  Using a machine to open your airway while you sleep, such as: ? An oral appliance. This is a mouthpiece that shifts your lower jaw forward. ? A CPAP device. This device blows air through a mask when you breathe out (exhale). ? An EPAP device. This has valves that you put in each nostril. ? A BPAP device. This device  blows air through a mask when you breathe in (inhale) and breathe out.  Having surgery if other treatments do not work. It is important to get treatment for sleep apnea. Without treatment, it can lead to:  High blood pressure.  Coronary artery disease.  In men, not being able to have an erection (impotence).  Reduced thinking ability.   Follow these instructions at home: Lifestyle  Make changes  that your doctor recommends.  Eat a healthy diet.  Lose weight if needed.  Avoid alcohol, medicines to help you relax, and some pain medicines.  Do not use any products that contain nicotine or tobacco, such as cigarettes, e-cigarettes, and chewing tobacco. If you need help quitting, ask your doctor. General instructions  Take over-the-counter and prescription medicines only as told by your doctor.  If you were given a machine to use while you sleep, use it only as told by your doctor.  If you are having surgery, make sure to tell your doctor you have sleep apnea. You may need to bring your device with you.  Keep all follow-up visits as told by your doctor. This is important. Contact a doctor if:  The machine that you were given to use during sleep bothers you or does not seem to be working.  You do not get better.  You get worse. Get help right away if:  Your chest hurts.  You have trouble breathing in enough air.  You have an uncomfortable feeling in your back, arms, or stomach.  You have trouble talking.  One side of your body feels weak.  A part of your face is hanging down. These symptoms may be an emergency. Do not wait to see if the symptoms will go away. Get medical help right away. Call your local emergency services (911 in the [place] Do not drive yourself to the hospital. Summary  This condition affects breathing during sleep.  The most common cause is a collapsed or blocked airway.  The goal of treatment is to help you breathe normally while you sleep. This information is not intended to replace advice given to you by your health care provider. Make sure you discuss any questions you have with your health care provider. Document Revised: 10/03/2018 Document Reviewed: 08/12/2018 Elsevier Patient Education  [place].

## 2021-03-28 NOTE — Progress Notes (Addendum)
PATIENT: Amanda Mcintosh DOB: 08-28-1971  REASON FOR VISIT: follow up HISTORY FROM: patient  Chief Complaint  Patient presents with  . Obstructive Sleep Apnea    RM 2 alone Pt is  well, doesn't think CPAP helps, sleeps better without it      HISTORY OF PRESENT ILLNESS: 03/28/21 ALL:  She returns for follow up for OSA on CPAP. She has continued fairly consistent use. She does not take it with her if she goes out of town. She spent 2 weeks with her daughter and did not take it with her. She feels that she sleeps better without CPAP but recognizes health benefits of using it. She has less headaches and does not wake feeling congested. She has recently started smoking. She has moved into a rental home that she doesn't love and her mother passed away. She is under more stress. She has a plan in place to stop smoking and increase exercise. She would like to lose weight.     07/19/9469 ALL:  Amanda Mcintosh is a 50 y.o. female here today for follow up for OSA on CPAP. She is doing well. She admits there are days when she doesn't use CPAP. She went out of town this past weekend and did not use her machine. She does note benefit of using CPAP.  She wakes feeling well rested.   Compliance report dated 06/25/2020 through 09/22/2020 reveals that she used CPAP 69 of the past 90 days for compliance of 77%.  She used CPAP greater than 4 hours 64 of the past 90 days for compliance of 71%.  Average usage was 6 hours and 55 minutes.  Residual AHI was 0.6 on 7 to 14 cm of water and an EPR of three.  There was no significant leak noted.  HISTORY: (copied from my note on 9/62/8366)  Amanda Mcintosh is a 50 y.o. female here today for follow up for OSA on CPAP. She admits that she has not been compliant with CPAP usage recently. She has gone on several vacations and not taken her machine. She knows that she needs to use it. No other concerns. She has recently quit smoking. She is working on [employer].   Compliance report dated 03/17/2020 through 06/14/2020 reveals that she used CPAP 26 of the last 90 days for compliance of 29%.  She is CPAP greater than 4 hours 23 of the past 90 days for compliance of 26%.  Average usage on days used was 6 hours and 2 minutes.  Residual AHI was 0.7 on 7 to 14 cm of water and an EPR of 3.  There was no significant leak noted.  HISTORY: (copied from my note on 2/94/7654)  Amanda Mcintosh a 50 y.o.femalehere today for follow up of OSA on CPAP.She reports doing very well with CPAP therapy. She has noted that she is grinding her teeth less. Compliance download dated 05/25/2019 through 06/23/2019 reveals that she is using her machine 28 out of the last 30 days for compliance of 93%. 28 days were used for greater than 4 hours for compliance of 93%. Average usage was 6 hours and 1 minute. AHI was 0.5 on 7 to 14 cm of water and an EPR level of 3. There was no significant leak. She returns today for evaluation.   History (copied from Amanda Mcintosh note on 03/03/2019)  Dear Amanda Mcintosh,   I saw your patient, Amanda Mcintosh, upon your kind request in my sleep clinic today for  initial consultation of her sleep disorder, in particular, concern for underlying obstructive sleep apnea. The patient is unaccompanied today. As you know, Amanda Mcintosh is a 50 year old right-handed woman with an underlying medical history of reflux disease, vitamin D deficiency, smoking, headaches, dizziness, seasonal allergies, chronic sinusitis, and morbid obesity with a BMI of over 40, who reports snoring and excessive daytime somnolence as well as witnessed apneas per family's report. I reviewed your office note from 01/28/2019. She had a home sleep test about 4-1/2 years ago and I reviewed the results. Test date was 09/14/2014, overall AHI was 9.2 per hour, O2 nadir was 81%. She weighed 231 pounds at the time, BMI of 38. Her Epworth sleepiness score is 7 out of 24 today,  fatigue score is 18 out of 63. She's not sleeping well. She reports a bedtime of 9 PM, some difficulty falling asleep. She does turn her bedroom TV off at 9 PM. She has a rise time of 6 AM. No night to night nocturia, occasional AM HAs, does have nasal congestion. Mom had OSA.  She had T/A as a child, no asthma.See Amanda Mcintosh for sinus d/s. She lives with her husband, she has 1 child. She works for [employer]. She smokes one pack per day, does not utilize alcohol typically and does not drink caffeine on a regular basis.  She is fasting from her church, has an appointment for FU in wt management soon. She has seen an endocrinologist. She has seen an integrative health provider. She has had stress, lost her mom 2 years ago. Recently lost a friend.   REVIEW OF SYSTEMS: Out of a complete 14 system review of symptoms, the patient complains only of the following symptoms, none and all other reviewed systems are negative.  ESS: 8 FSS:30  ALLERGIES: Allergies  Allergen Reactions  . Gadolinium Derivatives Other (See Comments)    PT STARTED SHAKING AND FELT VERY COLD INTERNALLY BUT FACE WAS VERY FLUSHED. THIS WAS A DELAYED REACTION (ABOUT 1 HOUR AFTER INJECTION). SHE TOOK BENADRYL AND HYDRATED AND SYMPTOMS SUBSIDED    HOME MEDICATIONS: Outpatient Medications Prior to Visit  Medication Sig Dispense Refill  . Ascorbic Acid (VITAMIN C) 1000 MG tablet Take 1,000 mg by mouth daily.    . cholecalciferol (VITAMIN D3) 25 MCG (1000 UT) tablet Take 1,000 Units by mouth daily.    . Multiple Vitamin (MULTIVITAMIN WITH MINERALS) TABS tablet Take 1 tablet by mouth daily.     . Turmeric 500 MG TABS Take 500 mg by mouth daily.    . valsartan-hydrochlorothiazide (DIOVAN HCT) 160-25 MG tablet Take 1 tablet by mouth daily. 90 tablet 0  . vitamin B-12 (CYANOCOBALAMIN) 100 MCG tablet Take 100 mcg by mouth daily.    . vitamin E 1000 UNIT capsule Take 1,000 Units by mouth daily.    . vitamin k 100 MCG tablet Take 100 mcg by  mouth daily.    . Zinc 50 MG CAPS Take by mouth.     No facility-administered medications prior to visit.    PAST MEDICAL HISTORY: Past Medical History:  Diagnosis Date  . Fibroids   . Heartburn   . History of headache   . Obesity   . Thyroid condition   . Vitamin D deficiency     PAST SURGICAL HISTORY: Past Surgical History:  Procedure Laterality Date  . CHOLECYSTECTOMY  2006  . ROBOT ASSISTED MYOMECTOMY  2011  . TONSILLECTOMY    . WISDOM TOOTH EXTRACTION      FAMILY  HISTORY: Family History  Problem Relation Age of Onset  . Diabetes Mother   . Hypertension Mother   . Heart failure Mother   . Stroke Mother   . High Cholesterol Mother   . Depression Mother   . Anxiety disorder Mother   . Sleep apnea Mother   . Obesity Mother   . Breast cancer Sister   . Skin cancer Sister   . COPD Father        smoker  . High blood pressure Father   . High Cholesterol Father   . HIV/AIDS Brother     SOCIAL HISTORY: Social History   Socioeconomic History  . Marital status: Married    Spouse name: Darryl  . Number of children: 1  . Years of education: Not on file  . Highest education level: Not on file  Occupational History  . Occupation: Holiday representative: [employer]  Tobacco Use  . Smoking status: Former Smoker    Packs/day: 0.30    Types: Cigarettes    Quit date: 12/01/2019    Years since quitting: 1.3  . Smokeless tobacco: Never Used  Substance and Sexual Activity  . Alcohol use: No  . Drug use: No  . Sexual activity: Not on file  Other Topics Concern  . Not on file  Social History Narrative  . Not on file   Social Determinants of Health   Financial Resource Strain: Not on file  Food Insecurity: Not on file  Transportation Needs: Not on file  Physical Activity: Not on file  Stress: Not on file  Social Connections: Not on file  Intimate Partner Violence: Not on file      PHYSICAL EXAM  Vitals:   03/28/21 1316  BP: 118/79   Pulse: 70  Weight: 264 lb (119.7 kg)  Height: 5\' 5"  (1.651 m)   Body mass index is 43.93 kg/m.  Generalized: Well developed, in no acute distress  Neurological examination  Mentation: Alert oriented to time, place, history taking. Follows all commands speech and language fluent Cranial nerve II-XII: Pupils were equal round reactive to light. Extraocular movements were full, visual field were full  Motor: The motor testing reveals 5 over 5 strength of all 4 extremities. Good symmetric motor tone is noted throughout.  Gait and station: Gait is normal.    DIAGNOSTIC DATA (LABS, IMAGING, TESTING) - I reviewed patient records, labs, notes, testing and imaging myself where available.  No flowsheet data found.   Lab Results  Component Value Date   WBC 9.1 04/05/2020   HGB 13.2 04/05/2020   HCT 39.6 04/05/2020   MCV 88.3 04/05/2020   PLT 367.0 04/05/2020      Component Value Date/Time   NA 138 04/05/2020 1039   K 4.0 04/05/2020 1039   CL 105 04/05/2020 1039   CO2 26 04/05/2020 1039   GLUCOSE 101 (H) 04/05/2020 1039   BUN 16 04/05/2020 1039   CREATININE 0.76 04/05/2020 1039   CALCIUM 8.9 04/05/2020 1039   PROT 7.0 04/05/2020 1039   ALBUMIN 4.1 04/05/2020 1039   AST 15 04/05/2020 1039   ALT 22 04/05/2020 1039   ALKPHOS 74 04/05/2020 1039   BILITOT 0.4 04/05/2020 1039   GFRNONAA >60 02/03/2019 0013   GFRAA >60 02/03/2019 0013   Lab Results  Component Value Date   CHOL 204 (H) 10/01/2018   HDL 29.80 (L) 10/01/2018   LDLCALC 135 (H) 10/01/2018   LDLDIRECT 139.0 09/06/2016   TRIG 199.0 (H)  10/01/2018   CHOLHDL 7 10/01/2018   Lab Results  Component Value Date   HGBA1C 5.6 04/05/2020   Lab Results  Component Value Date   VITAMINB12 491 09/06/2016   Lab Results  Component Value Date   TSH 1.13 04/05/2020       ASSESSMENT AND PLAN 50 y.o. year old female  has a past medical history of Fibroids, Heartburn, History of headache, Obesity, Thyroid condition, and  Vitamin D deficiency. here with     ICD-10-CM   1. OSA on CPAP  G47.33 For home use only DME continuous positive airway pressure (CPAP)   Z30.86      Bleu continues to work on CPAP compliance. Compliance report looking at past 90 days shows 83% daily compliance and 72% 4 hour usage. She does recognize health benefits of using CPAP. She was encouraged to continue working toward goal of nightly usage for a minimum of 4 hours. She will continue working on healthy lifestyle habits. Smoking cessation and weight management encouraged. She will continue follow up with PCP. Follow up with me in 1 year.    Orders Placed This Encounter  Procedures  . For home use only DME continuous positive airway pressure (CPAP)    Supplies    Order Specific Question:   Length of Need    Answer:   Lifetime    Order Specific Question:   Patient has OSA or probable OSA    Answer:   Yes    Order Specific Question:   Is the patient currently using CPAP in the home    Answer:   Yes    Order Specific Question:   Settings    Answer:   Other see comments    Order Specific Question:   CPAP supplies needed    Answer:   Mask, headgear, cushions, filters, heated tubing and water chamber     No orders of the defined types were placed in this encounter.     I spent 15 minutes with the patient. 50% of this time was spent counseling and educating patient on plan of care and medications.    Debbora Presto, FNP-C 03/28/2021, 2:02 PM [facility] [address] [place], [place] [address] [phone]  I reviewed the above note and documentation by the Nurse Practitioner and agree with the history, exam, assessment and plan as outlined above. I was available for consultation. [name] Age, MD, PhD [facility])

## 2021-03-31 ENCOUNTER — Ambulatory Visit (INDEPENDENT_AMBULATORY_CARE_PROVIDER_SITE_OTHER): Payer: No Typology Code available for payment source | Admitting: Family

## 2021-03-31 ENCOUNTER — Other Ambulatory Visit: Payer: Self-pay

## 2021-03-31 ENCOUNTER — Encounter: Payer: Self-pay | Admitting: Family

## 2021-03-31 VITALS — BP 122/72 | HR 83 | Temp 98.3°F | Ht 65.0 in | Wt 261.2 lb

## 2021-03-31 DIAGNOSIS — R7309 Other abnormal glucose: Secondary | ICD-10-CM | POA: Diagnosis not present

## 2021-03-31 DIAGNOSIS — E785 Hyperlipidemia, unspecified: Secondary | ICD-10-CM

## 2021-03-31 DIAGNOSIS — R5383 Other fatigue: Secondary | ICD-10-CM | POA: Diagnosis not present

## 2021-03-31 DIAGNOSIS — I10 Essential (primary) hypertension: Secondary | ICD-10-CM | POA: Diagnosis not present

## 2021-03-31 DIAGNOSIS — F419 Anxiety disorder, unspecified: Secondary | ICD-10-CM

## 2021-03-31 LAB — CBC WITH DIFFERENTIAL/PLATELET
Basophils Absolute: 0 10*3/uL (ref 0.0–0.1)
Basophils Relative: 0.3 % (ref 0.0–3.0)
Eosinophils Absolute: 0.1 10*3/uL (ref 0.0–0.7)
Eosinophils Relative: 1.1 % (ref 0.0–5.0)
HCT: 41.4 % (ref 36.0–46.0)
Hemoglobin: 13.5 g/dL (ref 12.0–15.0)
Lymphocytes Relative: 32.8 % (ref 12.0–46.0)
Lymphs Abs: 4.1 10*3/uL — ABNORMAL HIGH (ref 0.7–4.0)
MCHC: 32.8 g/dL (ref 30.0–36.0)
MCV: 87.7 fl (ref 78.0–100.0)
Monocytes Absolute: 0.7 10*3/uL (ref 0.1–1.0)
Monocytes Relative: 5.4 % (ref 3.0–12.0)
Neutro Abs: 7.6 10*3/uL (ref 1.4–7.7)
Neutrophils Relative %: 60.4 % (ref 43.0–77.0)
Platelets: 413 10*3/uL — ABNORMAL HIGH (ref 150.0–400.0)
RBC: 4.72 Mil/uL (ref 3.87–5.11)
RDW: 12.7 % (ref 11.5–15.5)
WBC: 12.6 10*3/uL — ABNORMAL HIGH (ref 4.0–10.5)

## 2021-03-31 LAB — COMPREHENSIVE METABOLIC PANEL
ALT: 19 U/L (ref 0–35)
AST: 12 U/L (ref 0–37)
Albumin: 4.2 g/dL (ref 3.5–5.2)
Alkaline Phosphatase: 73 U/L (ref 39–117)
BUN: 12 mg/dL (ref 6–23)
CO2: 29 mEq/L (ref 19–32)
Calcium: 9.3 mg/dL (ref 8.4–10.5)
Chloride: 102 mEq/L (ref 96–112)
Creatinine, Ser: 0.84 mg/dL (ref 0.40–1.20)
GFR: 80.21 mL/min (ref >60.00)
Glucose, Bld: 99 mg/dL (ref 70–99)
Potassium: 3.5 mEq/L (ref 3.5–5.1)
Sodium: 138 mEq/L (ref 135–145)
Total Bilirubin: 0.5 mg/dL (ref 0.2–1.2)
Total Protein: 7.4 g/dL (ref 6.0–8.3)

## 2021-03-31 LAB — HEMOGLOBIN A1C: Hgb A1c MFr Bld: 6 % (ref 4.6–6.5)

## 2021-03-31 LAB — LIPID PANEL
Cholesterol: 223 mg/dL — ABNORMAL HIGH (ref 0–200)
HDL: 34.7 mg/dL — ABNORMAL LOW (ref >39.00)
LDL Cholesterol: 156 mg/dL — ABNORMAL HIGH (ref 0–99)
NonHDL: 187.81
Total CHOL/HDL Ratio: 6
Triglycerides: 159 mg/dL — ABNORMAL HIGH (ref 0.0–149.0)
VLDL: 31.8 mg/dL (ref 0.0–40.0)

## 2021-03-31 LAB — TSH: TSH: 0.99 u[IU]/mL (ref 0.35–4.50)

## 2021-03-31 MED ORDER — ESCITALOPRAM OXALATE 10 MG PO TABS: 10.0000 mg | ORAL_TABLET | Freq: Every day | ORAL | 1 refills | Status: DC

## 2021-03-31 MED ORDER — VALSARTAN-HYDROCHLOROTHIAZIDE 160-25 MG PO TABS: 1.0000 | ORAL_TABLET | Freq: Every day | ORAL | 0 refills | Status: DC

## 2021-03-31 NOTE — Progress Notes (Signed)
Amanda Mcintosh is a 50 y.o. female with the following history as recorded in EpicCare:  Patient Active Problem List   Diagnosis Date Noted  . OSA on CPAP 06/24/2019  . Low back pain 04/30/2019  . Dizziness 07/04/2017  . Chronic seasonal allergic rhinitis 01/25/2017  . Chronic sinusitis 01/25/2017  . Lateral epicondylitis 01/13/2016  . Amenorrhea 06/28/2015  . Situational syncope 05/07/2015  . GERD (gastroesophageal reflux disease) 04/21/2015  . Routine general medical examination at a health care facility 04/21/2015  . Unspecified vitamin D deficiency 12/02/2013  . Class 2 severe obesity with serious comorbidity and body mass index (BMI) of 38.0 to 38.9 in adult [facility]) 12/02/2013  . Iron deficiency anemia 12/02/2013  . Tobacco use disorder 12/02/2013  . Migraine without aura, with intractable migraine, so stated, without mention of status migrainosus 03/11/2013  . Family history of early CAD 09/08/2012    Current Outpatient Medications  Medication Sig Dispense Refill  . Ascorbic Acid (VITAMIN C) 1000 MG tablet Take 1,000 mg by mouth daily.    . cholecalciferol (VITAMIN D3) 25 MCG (1000 UT) tablet Take 1,000 Units by mouth daily.    . Multiple Vitamin (MULTIVITAMIN WITH MINERALS) TABS tablet Take 1 tablet by mouth daily.     . Turmeric 500 MG TABS Take 500 mg by mouth daily.    . vitamin B-12 (CYANOCOBALAMIN) 100 MCG tablet Take 100 mcg by mouth daily.    . vitamin E 1000 UNIT capsule Take 1,000 Units by mouth daily.    . vitamin k 100 MCG tablet Take 100 mcg by mouth daily.    . Zinc 50 MG CAPS Take by mouth.    . escitalopram (LEXAPRO) 10 MG tablet Take 1 tablet (10 mg total) by mouth daily. 30 tablet 1  . valsartan-hydrochlorothiazide (DIOVAN HCT) 160-25 MG tablet Take 1 tablet by mouth daily. 90 tablet 0   No current facility-administered medications for this visit.    Allergies: Gadolinium derivatives  Past Medical History:  Diagnosis Date  . Fibroids   . Heartburn    . History of headache   . Obesity   . Thyroid condition   . Vitamin D deficiency     Past Surgical History:  Procedure Laterality Date  . CHOLECYSTECTOMY  2006  . ROBOT ASSISTED MYOMECTOMY  2011  . TONSILLECTOMY    . WISDOM TOOTH EXTRACTION      Family History  Problem Relation Age of Onset  . Diabetes Mother   . Hypertension Mother   . Heart failure Mother   . Stroke Mother   . High Cholesterol Mother   . Depression Mother   . Anxiety disorder Mother   . Sleep apnea Mother   . Obesity Mother   . Breast cancer Sister   . Skin cancer Sister   . COPD Father        smoker  . High blood pressure Father   . High Cholesterol Father   . HIV/AIDS Brother     Social History   Tobacco Use  . Smoking status: Current Some Day Smoker    Packs/day: 0.30    Types: Cigarettes    Last attempt to quit: 12/01/2019    Years since quitting: 1.3  . Smokeless tobacco: Never Used  Substance Use Topics  . Alcohol use: No    Subjective:  Presents for follow-up on hypertension; admits that anxiety is also becoming more problematic- requesting referral back to therapist; would be open to considering trial of medication.  Objective:  Vitals:   03/31/21 1527  BP: 122/72  Pulse: 83  Temp: 98.3 F (36.8 C)  TempSrc: Oral  SpO2: 98%  Weight: 261 lb 3.2 oz (118.5 kg)  Height: '5\' 5"'  (1.651 m)    General: Well developed, well nourished, in no acute distress  Skin : Warm and dry.  Head: Normocephalic and atraumatic  Eyes: Sclera and conjunctiva clear; pupils round and reactive to light; extraocular movements intact  Ears: External normal; canals clear; tympanic membranes normal  Oropharynx: Pink, supple. No suspicious lesions  Neck: Supple without thyromegaly, adenopathy  Lungs: Respirations unlabored; clear to auscultation bilaterally without wheeze, rales, rhonchi  CVS exam: normal rate and regular rhythm.  Neurologic: Alert and oriented; speech intact; face symmetrical; moves all  extremities well; CNII-XII intact without focal deficit   Assessment:  1. Essential hypertension   2. Other fatigue   3. Elevated glucose   4. Hyperlipidemia, unspecified hyperlipidemia type   5. Anxiety     Plan:  1. Stable; refills updated; 2. Check labs today encouraged to quit smoking; 5. Re-start Lexapro 10 mg daily; refer to behavioral health;   Follow-up to be determined based on lab results- scheduled for [facility] with Dr. Sharlet Salina for 1 month.   This visit occurred during the SARS-CoV-2 public health emergency.  Safety protocols were in place, including screening questions prior to the visit, additional usage of staff PPE, and extensive cleaning of exam room while observing appropriate contact time as indicated for disinfecting solutions.     No follow-ups on file.  Orders Placed This Encounter  Procedures  . CBC with Differential/Platelet    Standing Status:   Future    Number of Occurrences:   1    Standing Expiration Date:   03/31/2022  . Comp Met (CMET)    Standing Status:   Future    Number of Occurrences:   1    Standing Expiration Date:   03/31/2022  . TSH    Standing Status:   Future    Number of Occurrences:   1    Standing Expiration Date:   03/31/2022  . Hemoglobin A1c    Standing Status:   Future    Number of Occurrences:   1    Standing Expiration Date:   03/31/2022  . Lipid panel    Standing Status:   Future    Number of Occurrences:   1    Standing Expiration Date:   03/31/2022  . Ambulatory referral to Psychology    Referral Priority:   Routine    Referral Type:   Psychiatric    Referral Reason:   Specialty Services Required    Requested Specialty:   Psychology    Number of Visits Requested:   1    Requested Prescriptions   Signed Prescriptions Disp Refills  . valsartan-hydrochlorothiazide (DIOVAN HCT) 160-25 MG tablet 90 tablet 0    Sig: Take 1 tablet by mouth daily.  Marland Kitchen escitalopram (LEXAPRO) 10 MG tablet 30 tablet 1    Sig: Take 1 tablet (10 mg  total) by mouth daily.

## 2021-04-17 ENCOUNTER — Telehealth: Payer: Self-pay | Admitting: Family

## 2021-04-17 ENCOUNTER — Other Ambulatory Visit: Payer: Self-pay | Admitting: Family

## 2021-04-17 MED ORDER — CYCLOBENZAPRINE HCL 5 MG PO TABS: 5.0000 mg | ORAL_TABLET | Freq: Three times a day (TID) | ORAL | 0 refills | Status: DC | PRN

## 2021-04-17 NOTE — Telephone Encounter (Signed)
Please advise on message below. Pt was last seen on 03/31/21 for HTN.

## 2021-04-17 NOTE — Progress Notes (Signed)
   I, Peterson Lombard, LAT, ATC acting as a scribe for Lynne Leader, MD.  Subjective:    CC: Low back pain  HPI: Pt is a 50 yo female c/o back pain ongoing for sporadically for over a year. Pt was previously seen by PCP on 01/05/20 for LBPn and prescribed Mobic and Robaxin. Pt notes she was unable to get out of bed yesterday. Pt works from home and sits for long periods of time. Pt reports pain will come and go. Pt locates pain to bilat low back, R>L.  Radiating pn: yes- into R hip LE numbness/tingling: yes LE weakness: no Aggravates: standing Treatments tried: flexeril, rest, heat  Dx imaging: 07/13/20 C-spine XR  01/05/20 L-spine XR  Pertinent review of Systems: No fevers or chills  Relevant historical information: Lateral epicondylitis   Objective:    Vitals:   04/18/21 1342  BP: 120/80  Pulse: 90  SpO2: 97%   General: Well Developed, well nourished, and in no acute distress.   MSK: L-spine: Normal-appearing Nontender midline. Tender palpation lumbar paraspinal musculature. Decreased motion to flexion and extension otherwise normal. Lower extremity strength reflexes and sensation are intact distally.   Lab and Radiology Results  X-ray images L-spine obtained today personally and independently interpreted Mild facet DJD L4-L5 and a 5 S1 no fractures or malalignment. Await formal radiology review   Impression and Recommendations:    Assessment and Plan: 50 y.o. female with acute on chronic low back pain thought to be due primarily to myofascial spasm and dysfunction.  Plan for physical therapy, heating pad and TENS unit.  Recheck in about 6 weeks.  Return sooner if needed.Marland Kitchen  PDMP not reviewed this encounter. Orders Placed This Encounter  Procedures  . DG Lumbar Spine 2-3 Views    Standing Status:   Future    Number of Occurrences:   1    Standing Expiration Date:   04/18/2022    Order Specific Question:   Reason for Exam (SYMPTOM  OR DIAGNOSIS REQUIRED)     Answer:   eval lumbar pain    Order Specific Question:   Is patient pregnant?    Answer:   No    Order Specific Question:   Preferred imaging location?    Answer:   [facility]  . Ambulatory referral to Physical Therapy    Referral Priority:   Routine    Referral Type:   Physical Medicine    Referral Reason:   Specialty Services Required    Requested Specialty:   Physical Therapy   No orders of the defined types were placed in this encounter.   Discussed warning signs or symptoms. Please see discharge instructions. Patient expresses understanding.   The above documentation has been reviewed and is accurate and complete Lynne Leader, M.D.

## 2021-04-17 NOTE — Telephone Encounter (Signed)
Patient called and said that she is having trouble getting out of bed because of back pain. She was wondering if cyclobenzaprine (FLEXERIL) 5 MG tablet could be called in to [id number] - [place], [place] [facility] [address]. Her last OV was 03-31-21. Please advise

## 2021-04-17 NOTE — Telephone Encounter (Signed)
Refill sent in as requested; if not improved in the next 24-48 hours, she should plan to see one of our sports medicine providers for further evaluation.

## 2021-04-17 NOTE — Telephone Encounter (Signed)
I have called pt back and relayed the message to the pt and she stated understanding with gratitude. I have given her the number to sports medicine and she will call them to get scheduled so she can get down to the root of the issue. This is the second time that she had experience this so far.

## 2021-04-18 ENCOUNTER — Ambulatory Visit (INDEPENDENT_AMBULATORY_CARE_PROVIDER_SITE_OTHER): Payer: No Typology Code available for payment source

## 2021-04-18 ENCOUNTER — Ambulatory Visit (INDEPENDENT_AMBULATORY_CARE_PROVIDER_SITE_OTHER): Payer: No Typology Code available for payment source | Admitting: Family Medicine

## 2021-04-18 ENCOUNTER — Other Ambulatory Visit: Payer: Self-pay

## 2021-04-18 VITALS — BP 120/80 | HR 90 | Ht 65.0 in | Wt 263.6 lb

## 2021-04-18 DIAGNOSIS — G8929 Other chronic pain: Secondary | ICD-10-CM

## 2021-04-18 DIAGNOSIS — M545 Low back pain, unspecified: Secondary | ICD-10-CM | POA: Diagnosis not present

## 2021-04-18 NOTE — Patient Instructions (Addendum)
Thank you for coming in today.  Please get an Xray today before you leave  I've referred you to Physical Therapy.  Let us know if you don't hear from them in one week.  Recheck in 6 weeks.    TENS UNIT: This is helpful for muscle pain and spasm.   Search and Purchase a TENS 7000 2nd edition at  www.tenspros.com or www.[email or web address] It should be less than $30.     TENS unit instructions: Do not shower or bathe with the unit on . Turn the unit off before removing electrodes or batteries . If the electrodes lose stickiness add a drop of water to the electrodes after they are disconnected from the unit and place on plastic sheet. If you continued to have difficulty, call the TENS unit company to purchase more electrodes. . Do not apply lotion on the skin area prior to use. Make sure the skin is clean and dry as this will help prolong the life of the electrodes. . After use, always check skin for unusual red areas, rash or other skin difficulties. If there are any skin problems, does not apply electrodes to the same area. . Never remove the electrodes from the unit by pulling the wires. . Do not use the TENS unit or electrodes other than as directed. . Do not change electrode placement without consultating your therapist or physician. Marland Kitchen Keep 2 fingers with between each electrode. . Wear time ratio is 2:1, on to off times.    For example on for 30 minutes off for 15 minutes and then on for 30 minutes off for 15 minutes

## 2021-04-19 NOTE — Progress Notes (Signed)
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

## 2021-04-24 ENCOUNTER — Ambulatory Visit (INDEPENDENT_AMBULATORY_CARE_PROVIDER_SITE_OTHER): Payer: No Typology Code available for payment source | Admitting: Physical Therapy

## 2021-04-24 ENCOUNTER — Encounter: Payer: Self-pay | Admitting: Physical Therapy

## 2021-04-24 ENCOUNTER — Other Ambulatory Visit: Payer: Self-pay

## 2021-04-24 DIAGNOSIS — G8929 Other chronic pain: Secondary | ICD-10-CM

## 2021-04-24 DIAGNOSIS — M545 Low back pain, unspecified: Secondary | ICD-10-CM | POA: Diagnosis not present

## 2021-04-24 DIAGNOSIS — R29898 Other symptoms and signs involving the musculoskeletal system: Secondary | ICD-10-CM | POA: Diagnosis not present

## 2021-04-24 DIAGNOSIS — R293 Abnormal posture: Secondary | ICD-10-CM

## 2021-04-24 NOTE — Patient Instructions (Signed)
Access Code: [email or web address] URL: [email or web address] Date: 04/24/2021 Prepared by: Faustino Congress  Exercises Hooklying Single Knee to Chest - 2 x daily - 7 x weekly - 3 reps - 1 sets - 30 sec hold Supine Piriformis Stretch with Foot on Ground - 2 x daily - 7 x weekly - 3 reps - 1 sets - 30 sec hold Self Release - 2 x daily - 7 x weekly - 1 sets - 1 reps - 2-3 min hold Supine Posterior Pelvic Tilt - 2 x daily - 7 x weekly - 10 reps - 1 sets - 5 sec hold

## 2021-04-24 NOTE — Therapy (Signed)
[place], [place], [address] Phone: [phone]   Fax:  [phone]  Physical Therapy Evaluation  Patient Details  Name: Amanda Mcintosh MRN: [id number] Date of Birth: 1971/06/19 Referring Provider (PT): Gregor Hams, MD   Encounter Date: 04/24/2021   PT End of Session - 04/24/21 1242    Visit Number 1    Number of Visits 6    Date for PT Re-Evaluation 06/05/21    PT Start Time 1141    PT Stop Time 1225    PT Time Calculation (min) 44 min    Activity Tolerance Patient tolerated treatment well    Behavior During Therapy [facility] for tasks assessed/performed           Past Medical History:  Diagnosis Date  . Fibroids   . Heartburn   . History of headache   . Obesity   . Thyroid condition   . Vitamin D deficiency     Past Surgical History:  Procedure Laterality Date  . CHOLECYSTECTOMY  2006  . ROBOT ASSISTED MYOMECTOMY  2011  . TONSILLECTOMY    . WISDOM TOOTH EXTRACTION      There were no vitals filed for this visit.    Subjective Assessment - 04/24/21 1148    Subjective Pt is a 50 y/o female who presents to OPPT for acute onset of chronic back pain.  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  She reports mild pain with activity, but this was severe and unable to get out of bed.  Feels episodes are occuring more frequently and with less activity.    Pertinent History obesity, HTN    Limitations Lifting;Standing;Walking    How long can you stand comfortably? 20 min    How long can you walk comfortably? 30 min    Diagnostic tests xrays: mild arthritis    Patient Stated Goals improve pain/spasm    Currently in Pain? Yes    Pain Score 3    up to 10/10; at best 0/10   Pain Location Back    Pain Orientation Right;Lower    Pain Descriptors / Indicators Spasm;Tightness;Cramping    Pain Type Chronic pain;Acute pain     Pain Radiating Towards Rt hip/thigh    Pain Onset More than a month ago    Pain Frequency Intermittent    Aggravating Factors  standing, walking, otherwise unsure    Pain Relieving Factors lie down, rest, heat              [facility] PT Assessment - 04/24/21 1154      Assessment   Medical Diagnosis M54.50,G89.29 (ICD-10-CM) - Chronic bilateral low back pain without sciatica    Referring Provider (PT) Gregor Hams, MD    Onset Date/Surgical Date --   2-3 years; exacerbation 1 week ago   Hand Dominance Right    Next MD Visit 06/06/21    Prior Therapy n/a      Precautions   Precautions None      Restrictions   Weight Bearing Restrictions No      Balance Screen   Has the patient fallen in the past 6 months No    Has the patient had a decrease in activity level because of a fear of falling?  No    Is the patient reluctant to leave their home because of a fear of falling?  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    [place], shopping; no regular exercise (has a treadmill at home)      Cognition   Overall Cognitive Status Within Functional Limits for tasks assessed      Observation/Other Assessments   Focus on Therapeutic Outcomes (FOTO)  47 (predicted 64)      Posture/Postural Control   Posture/Postural Control Postural limitations    Postural Limitations Rounded Shoulders;Forward head;Increased lumbar lordosis      ROM / Strength   AROM / PROM / Strength AROM;Strength      AROM   AROM Assessment Site Lumbar     Lumbar Flexion limited 25%    Lumbar Extension WNL    Lumbar - Right Side Bend WNL   pain on Rt   Lumbar - Left Side Bend WNL    Lumbar - Right Rotation WNL    Lumbar - Left Rotation WNL      Strength   Overall Strength Comments core strength grossly 3/5; hip strength 4/5      Palpation   Spinal mobility WNL, mild pain with CPA mobs L3-5    Palpation comment tightness and trigger points noted in Rt QL and into glutes      Special Tests    Special Tests Lumbar    Lumbar Tests Slump Test      Slump test   Findings Negative                      Objective measurements completed on examination: See above findings.       [facility] Adult PT Treatment/Exercise - 04/24/21 1154      Exercises   Exercises Other Exercises    Other Exercises  see pt instructions - performed 1-2 reps of each exercise                  PT Education - 04/24/21 1242    Education Details HEP    Person(s) Educated Patient    Methods Explanation;Demonstration;Handout    Comprehension Verbalized understanding            PT Short Term Goals - 04/24/21 1245      PT SHORT TERM GOAL #1   Title independent with initial HEP    Status New    Target Date 05/15/21             PT Long Term Goals - 04/24/21 1246      PT LONG TERM GOAL #1   Title independent with final HEP    Time 6    Period Weeks    Status New    Target Date 05/29/21      PT LONG TERM GOAL #2   Title FOTO score improved to 64 for improved function    Time 6    Period Weeks    Status New    Target Date 06/05/21      PT LONG TERM GOAL #3  Title report ability to walk/stand > 30 min with pain < 3/10 for improved function and mobility    Time 6    Period Weeks    Status New    Target Date 06/05/21      PT LONG TERM GOAL #4   Title perform lumbar ROM without increase in pain for improved function    Time 6    Period Weeks    Status New    Target Date 06/05/21                  Plan -  04/24/21 1220    Clinical Impression Statement Pt is a 50 y/o female who presents to OPPT for acute exacerbation of chronic LBP.  She demonstrates decreased flexibility and mild strength deficits as well as pain with ROM affecting functional mobility.  Pt will benefit from PT to address deficits listed.    Personal Factors and Comorbidities Comorbidity 2    Comorbidities obesity, HTN    Examination-Activity Limitations Bend;Squat;Stairs;Carry;Stand;Lift;Transfers;Locomotion Level    Examination-Participation Restrictions Community Activity;Shop;Occupation;Cleaning;Meal Prep    Stability/Clinical Decision Making Evolving/Moderate complexity    Clinical Decision Making Moderate    Rehab Potential Good    PT Frequency 1x / week    PT Duration 6 weeks    PT Treatment/Interventions ADLs/Self Care Home Management;Cryotherapy;Electrical Stimulation;Moist Heat;Traction;Therapeutic exercise;Therapeutic activities;Functional mobility training;Gait training;Stair training;Ultrasound;Patient/family education;Manual techniques;Taping;Dry needling    PT Next Visit Plan review HEP and progress hip/core strength and stability; work on endurance (briefly discussed walking program)    PT Home Exercise Plan Access Code: [place] and Agree with Plan of Care Patient           Patient will benefit from skilled therapeutic intervention in order to improve the following deficits and impairments:  Pain,Increased fascial restricitons,Decreased strength,Increased muscle spasms,Decreased mobility,Decreased activity tolerance,Decreased range of motion,Impaired flexibility,Postural dysfunction  Visit Diagnosis: Chronic right-sided low back pain without sciatica - Plan: PT plan of care cert/re-cert  Abnormal posture - Plan: PT plan of care cert/re-cert  Other symptoms and signs involving the musculoskeletal system - Plan: PT plan of care cert/re-cert     Problem List Patient Active Problem List    Diagnosis Date Noted  . OSA on CPAP 06/24/2019  . Low back pain 04/30/2019  . Dizziness 07/04/2017  . Chronic seasonal allergic rhinitis 01/25/2017  . Chronic sinusitis 01/25/2017  . Lateral epicondylitis 01/13/2016  . Amenorrhea 06/28/2015  . Situational syncope 05/07/2015  . GERD (gastroesophageal reflux disease) 04/21/2015  . Routine general medical examination at a health care facility 04/21/2015  . Unspecified vitamin D deficiency 12/02/2013  . Class 2 severe obesity with serious comorbidity and body mass index (BMI) of 38.0 to 38.9 in adult [facility]) 12/02/2013  . Iron deficiency anemia 12/02/2013  . Tobacco use disorder 12/02/2013  . Migraine without aura, with intractable migraine, so stated, without mention of status migrainosus 03/11/2013  . Family history of early CAD 09/08/2012     Laureen Abrahams, PT, DPT 04/24/21 12:49 PM     [facility] Physical Therapy [address] [place], [place], [address] Phone: [phone]   Fax:  [phone]  Name: Amanda Mcintosh MRN: [id number] Date of Birth: 1971-10-26

## 2021-05-01 ENCOUNTER — Encounter: Payer: Self-pay | Admitting: Physical Therapy

## 2021-05-01 ENCOUNTER — Encounter: Payer: No Typology Code available for payment source | Admitting: Internal Medicine

## 2021-05-01 ENCOUNTER — Other Ambulatory Visit: Payer: Self-pay

## 2021-05-01 ENCOUNTER — Ambulatory Visit (INDEPENDENT_AMBULATORY_CARE_PROVIDER_SITE_OTHER): Payer: No Typology Code available for payment source | Admitting: Physical Therapy

## 2021-05-01 DIAGNOSIS — G8929 Other chronic pain: Secondary | ICD-10-CM | POA: Diagnosis not present

## 2021-05-01 DIAGNOSIS — R293 Abnormal posture: Secondary | ICD-10-CM

## 2021-05-01 DIAGNOSIS — R29898 Other symptoms and signs involving the musculoskeletal system: Secondary | ICD-10-CM | POA: Diagnosis not present

## 2021-05-01 DIAGNOSIS — M545 Low back pain, unspecified: Secondary | ICD-10-CM

## 2021-05-01 NOTE — Therapy (Signed)
[facility] Physical Therapy [address] [place], [place], [address] Phone: [phone]   Fax:  [phone]  Physical Therapy Treatment  Patient Details  Name: Amanda Mcintosh MRN: [id number] Date of Birth: Jul 22, 1971 Referring Provider (PT): Gregor Hams, MD   Encounter Date: 05/01/2021   PT End of Session - 05/01/21 1228    Visit Number 2    Number of Visits 6    Date for PT Re-Evaluation 06/05/21    PT Start Time 4742   arrives late as she had her time mixed up   PT Stop Time 1231    PT Time Calculation (min) 32 min    Activity Tolerance Patient tolerated treatment well    Behavior During Therapy [facility] for tasks assessed/performed           Past Medical History:  Diagnosis Date  . Fibroids   . Heartburn   . History of headache   . Obesity   . Thyroid condition   . Vitamin D deficiency     Past Surgical History:  Procedure Laterality Date  . CHOLECYSTECTOMY  2006  . ROBOT ASSISTED MYOMECTOMY  2011  . TONSILLECTOMY    . WISDOM TOOTH EXTRACTION      There were no vitals filed for this visit.   Subjective Assessment - 05/01/21 1206    Subjective She relays her back pain feels some better, has been doing her HEP and she feels like it is helping some.    Pertinent History obesity, HTN    Limitations Lifting;Standing;Walking    How long can you stand comfortably? 20 min    How long can you walk comfortably? 30 min    Diagnostic tests xrays: mild arthritis    Patient Stated Goals improve pain/spasm    Pain Onset More than a month ago            [facility] Adult PT Treatment/Exercise - 05/01/21 0001      Exercises   Exercises Lumbar      Lumbar Exercises: Stretches   Single Knee to Chest Stretch Right;Left;2 reps;30 seconds    Lower Trunk Rotation 5 reps;10 seconds    Piriformis Stretch Right;Left;2 reps;30 seconds      Lumbar Exercises: Standing   Row 20 reps    Theraband Level (Row) Level 3 (Green)    Shoulder Extension 20 reps     Theraband Level (Shoulder Extension) Level 3 (Green)    Other Standing Lumbar Exercises deadlift 5# KB X15 reps      Lumbar Exercises: Supine   Clam 15 reps    Clam Limitations 2 sets with green    Bent Knee Raise 15 reps    Bent Knee Raise Limitations with green    Bridge 10 reps;5 seconds    Bridge Limitations 2 sets      Manual Therapy   Manual therapy comments long axis distraction LLE then RLE 60 sec bouts X3 ea. then moved her to prone for central PA mobs grade 2 overall for lumbar segments.                    PT Short Term Goals - 04/24/21 1245      PT SHORT TERM GOAL #1   Title independent with initial HEP    Status New    Target Date 05/15/21             PT Long Term Goals - 04/24/21 1246      PT LONG TERM GOAL #  1   Title independent with final HEP    Time 6    Period Weeks    Status New    Target Date 05/29/21      PT LONG TERM GOAL #2   Title FOTO score improved to 64 for improved function    Time 6    Period Weeks    Status New    Target Date 06/05/21      PT LONG TERM GOAL #3   Title report ability to walk/stand > 30 min with pain < 3/10 for improved function and mobility    Time 6    Period Weeks    Status New    Target Date 06/05/21      PT LONG TERM GOAL #4   Title perform lumbar ROM without increase in pain for improved function    Time 6    Period Weeks    Status New    Target Date 06/05/21                 Plan - 05/01/21 1232    Clinical Impression Statement Shorter session today due to time constraints as she arrives late. She does show signs of early progress and has been doing her HEP. She had good tolerance today to exercise progression with focus on lumbar/core strength today.    Personal Factors and Comorbidities Comorbidity 2    Comorbidities obesity, HTN    Examination-Activity Limitations Bend;Squat;Stairs;Carry;Stand;Lift;Transfers;Locomotion Level    Examination-Participation Restrictions Community  Activity;Shop;Occupation;Cleaning;Meal Prep    Stability/Clinical Decision Making Evolving/Moderate complexity    Rehab Potential Good    PT Frequency 1x / week    PT Duration 6 weeks    PT Treatment/Interventions ADLs/Self Care Home Management;Cryotherapy;Electrical Stimulation;Moist Heat;Traction;Therapeutic exercise;Therapeutic activities;Functional mobility training;Gait training;Stair training;Ultrasound;Patient/family education;Manual techniques;Taping;Dry needling    PT Next Visit Plan review HEP and progress hip/core strength and stability; work on endurance (briefly discussed walking program)    PT Home Exercise Plan Access Code: [place] and Agree with Plan of Care Patient           Patient will benefit from skilled therapeutic intervention in order to improve the following deficits and impairments:  Pain,Increased fascial restricitons,Decreased strength,Increased muscle spasms,Decreased mobility,Decreased activity tolerance,Decreased range of motion,Impaired flexibility,Postural dysfunction  Visit Diagnosis: Chronic right-sided low back pain without sciatica  Abnormal posture  Other symptoms and signs involving the musculoskeletal system     Problem List Patient Active Problem List   Diagnosis Date Noted  . OSA on CPAP 06/24/2019  . Low back pain 04/30/2019  . Dizziness 07/04/2017  . Chronic seasonal allergic rhinitis 01/25/2017  . Chronic sinusitis 01/25/2017  . Lateral epicondylitis 01/13/2016  . Amenorrhea 06/28/2015  . Situational syncope 05/07/2015  . GERD (gastroesophageal reflux disease) 04/21/2015  . Routine general medical examination at a health care facility 04/21/2015  . Unspecified vitamin D deficiency 12/02/2013  . Class 2 severe obesity with serious comorbidity and body mass index (BMI) of 38.0 to 38.9 in adult [facility]) 12/02/2013  . Iron deficiency anemia 12/02/2013  . Tobacco use disorder 12/02/2013  . Migraine without aura, with  intractable migraine, so stated, without mention of status migrainosus 03/11/2013  . Family history of early CAD 09/08/2012    Silvestre Mesi 05/01/2021, 12:34 PM  [facility] Physical Therapy [address] [place], [place], [address] Phone: [phone]   Fax:  [phone]  Name: ORLANDA FRANKUM MRN: [id number] Date of Birth: 1971-01-12

## 2021-05-02 ENCOUNTER — Encounter: Payer: Self-pay | Admitting: Family

## 2021-05-02 ENCOUNTER — Ambulatory Visit (INDEPENDENT_AMBULATORY_CARE_PROVIDER_SITE_OTHER): Payer: No Typology Code available for payment source | Admitting: Family

## 2021-05-02 VITALS — BP 110/68 | HR 97 | Temp 98.4°F | Ht 65.0 in | Wt 258.8 lb

## 2021-05-02 DIAGNOSIS — R7303 Prediabetes: Secondary | ICD-10-CM

## 2021-05-02 DIAGNOSIS — D72829 Elevated white blood cell count, unspecified: Secondary | ICD-10-CM

## 2021-05-02 DIAGNOSIS — I10 Essential (primary) hypertension: Secondary | ICD-10-CM

## 2021-05-02 NOTE — Progress Notes (Signed)
Amanda Mcintosh is a 50 y.o. female with the following history as recorded in EpicCare:  Patient Active Problem List   Diagnosis Date Noted  . OSA on CPAP 06/24/2019  . Low back pain 04/30/2019  . Dizziness 07/04/2017  . Chronic seasonal allergic rhinitis 01/25/2017  . Chronic sinusitis 01/25/2017  . Lateral epicondylitis 01/13/2016  . Amenorrhea 06/28/2015  . Situational syncope 05/07/2015  . GERD (gastroesophageal reflux disease) 04/21/2015  . Routine general medical examination at a health care facility 04/21/2015  . Unspecified vitamin D deficiency 12/02/2013  . Class 2 severe obesity with serious comorbidity and body mass index (BMI) of 38.0 to 38.9 in adult [facility]) 12/02/2013  . Iron deficiency anemia 12/02/2013  . Tobacco use disorder 12/02/2013  . Migraine without aura, with intractable migraine, so stated, without mention of status migrainosus 03/11/2013  . Family history of early CAD 09/08/2012    Current Outpatient Medications  Medication Sig Dispense Refill  . Ascorbic Acid (VITAMIN C) 1000 MG tablet Take 1,000 mg by mouth daily.    . cholecalciferol (VITAMIN D3) 25 MCG (1000 UT) tablet Take 1,000 Units by mouth daily.    . cyclobenzaprine (FLEXERIL) 5 MG tablet Take 1 tablet (5 mg total) by mouth 3 (three) times daily as needed for muscle spasms. 30 tablet 0  . Multiple Vitamin (MULTIVITAMIN WITH MINERALS) TABS tablet Take 1 tablet by mouth daily.     . Turmeric 500 MG TABS Take 500 mg by mouth daily.    . valsartan-hydrochlorothiazide (DIOVAN HCT) 160-25 MG tablet Take 1 tablet by mouth daily. 90 tablet 0  . vitamin B-12 (CYANOCOBALAMIN) 100 MCG tablet Take 100 mcg by mouth daily.    . vitamin E 1000 UNIT capsule Take 1,000 Units by mouth daily.    . vitamin k 100 MCG tablet Take 100 mcg by mouth daily.    . Zinc 50 MG CAPS Take by mouth.    . escitalopram (LEXAPRO) 10 MG tablet Take 1 tablet (10 mg total) by mouth daily. (Patient not taking: No sig reported) 30  tablet 1   No current facility-administered medications for this visit.    Allergies: Gadolinium derivatives  Past Medical History:  Diagnosis Date  . Fibroids   . Heartburn   . History of headache   . Obesity   . Thyroid condition   . Vitamin D deficiency     Past Surgical History:  Procedure Laterality Date  . CHOLECYSTECTOMY  2006  . ROBOT ASSISTED MYOMECTOMY  2011  . TONSILLECTOMY    . WISDOM TOOTH EXTRACTION      Family History  Problem Relation Age of Onset  . Diabetes Mother   . Hypertension Mother   . Heart failure Mother   . Stroke Mother   . High Cholesterol Mother   . Depression Mother   . Anxiety disorder Mother   . Sleep apnea Mother   . Obesity Mother   . Breast cancer Sister   . Skin cancer Sister   . COPD Father        smoker  . High blood pressure Father   . High Cholesterol Father   . HIV/AIDS Brother     Social History   Tobacco Use  . Smoking status: Current Some Day Smoker    Packs/day: 0.30    Types: Cigarettes    Last attempt to quit: 12/01/2019    Years since quitting: 1.4  . Smokeless tobacco: Never Used  . Tobacco comment: Working on cutting back  Substance Use Topics  . Alcohol use: No    Subjective:  1 month follow up on hypertension/ situational anxiety/ pre-diabetes; Is committed to taking better care of herself; has been taking her blood pressure medication daily; trying to quit smoking- actively cutting back; Denies any chest pain, shortness of breath, blurred vision or headache Is not taking the Lexapro daily- planning to establish with new therapist;     Objective:  Vitals:   05/02/21 1508  BP: 110/68  Pulse: 97  Temp: 98.4 F (36.9 C)  TempSrc: Oral  SpO2: 96%  Weight: 258 lb 12.8 oz (117.4 kg)  Height: 5\' 5"  (1.651 m)    General: Well developed, well nourished, in no acute distress  Skin : Warm and dry.  Head: Normocephalic and atraumatic  Eyes: Sclera and conjunctiva clear; pupils round and reactive to  light; extraocular movements intact  Ears: External normal; canals clear; tympanic membranes normal  Oropharynx: Pink, supple. No suspicious lesions  Neck: Supple without thyromegaly, adenopathy  Lungs: Respirations unlabored; clear to auscultation bilaterally without wheeze, rales, rhonchi  CVS exam: normal rate and regular rhythm.  Neurologic: Alert and oriented; speech intact; face symmetrical; moves all extremities well; CNII-XII intact without focal deficit   Assessment:  1. Leukocytosis, unspecified type   2. Pre-diabetes   3. Primary hypertension     Plan:  1. Check WBC today; suspect related to smoking; encouraged to quit smoking;  2. Referral to nutritionist; continue working on weight loss goals; 3. Stable; continue same medications; discussed that with weight loss, may be able to discontinue medication; follow up for 4-6 months;   FMLA completed for patient today for 6 months;  This visit occurred during the SARS-CoV-2 public health emergency.  Safety protocols were in place, including screening questions prior to the visit, additional usage of staff PPE, and extensive cleaning of exam room while observing appropriate contact time as indicated for disinfecting solutions.     No follow-ups on file.  Orders Placed This Encounter  Procedures  . CBC with Differential/Platelet  . Basic Metabolic Panel (BMET)  . Amb ref to Medical Nutrition Therapy-MNT    Referral Priority:   Routine    Referral Type:   Consultation    Referral Reason:   Specialty Services Required    Requested Specialty:   Nutrition    Number of Visits Requested:   1    Requested Prescriptions    No prescriptions requested or ordered in this encounter

## 2021-05-03 LAB — BASIC METABOLIC PANEL
BUN: 16 mg/dL (ref 6–23)
CO2: 29 mEq/L (ref 19–32)
Calcium: 9.3 mg/dL (ref 8.4–10.5)
Chloride: 101 mEq/L (ref 96–112)
Creatinine, Ser: 0.87 mg/dL (ref 0.40–1.20)
GFR: 76.85 mL/min (ref >60.00)
Glucose, Bld: 88 mg/dL (ref 70–99)
Potassium: 3.6 mEq/L (ref 3.5–5.1)
Sodium: 139 mEq/L (ref 135–145)

## 2021-05-03 LAB — CBC WITH DIFFERENTIAL/PLATELET
Basophils Absolute: 0.1 10*3/uL (ref 0.0–0.1)
Basophils Relative: 0.7 % (ref 0.0–3.0)
Eosinophils Absolute: 0.1 10*3/uL (ref 0.0–0.7)
Eosinophils Relative: 1.1 % (ref 0.0–5.0)
HCT: 40.3 % (ref 36.0–46.0)
Hemoglobin: 13.6 g/dL (ref 12.0–15.0)
Lymphocytes Relative: 32.5 % (ref 12.0–46.0)
Lymphs Abs: 3.9 10*3/uL (ref 0.7–4.0)
MCHC: 33.7 g/dL (ref 30.0–36.0)
MCV: 86.8 fl (ref 78.0–100.0)
Monocytes Absolute: 0.7 10*3/uL (ref 0.1–1.0)
Monocytes Relative: 6.2 % (ref 3.0–12.0)
Neutro Abs: 7.1 10*3/uL (ref 1.4–7.7)
Neutrophils Relative %: 59.5 % (ref 43.0–77.0)
Platelets: 416 10*3/uL — ABNORMAL HIGH (ref 150.0–400.0)
RBC: 4.64 Mil/uL (ref 3.87–5.11)
RDW: 12.8 % (ref 11.5–15.5)
WBC: 11.9 10*3/uL — ABNORMAL HIGH (ref 4.0–10.5)

## 2021-05-05 ENCOUNTER — Ambulatory Visit: Payer: No Typology Code available for payment source | Admitting: Family

## 2021-05-08 ENCOUNTER — Ambulatory Visit (INDEPENDENT_AMBULATORY_CARE_PROVIDER_SITE_OTHER): Payer: No Typology Code available for payment source | Admitting: Physical Therapy

## 2021-05-08 ENCOUNTER — Encounter: Payer: Self-pay | Admitting: Physical Therapy

## 2021-05-08 ENCOUNTER — Other Ambulatory Visit: Payer: Self-pay

## 2021-05-08 DIAGNOSIS — R293 Abnormal posture: Secondary | ICD-10-CM | POA: Diagnosis not present

## 2021-05-08 DIAGNOSIS — G8929 Other chronic pain: Secondary | ICD-10-CM

## 2021-05-08 DIAGNOSIS — M545 Low back pain, unspecified: Secondary | ICD-10-CM

## 2021-05-08 DIAGNOSIS — R29898 Other symptoms and signs involving the musculoskeletal system: Secondary | ICD-10-CM

## 2021-05-08 NOTE — Therapy (Signed)
[facility] Physical Therapy [address] [place], [place], [address] Phone: [phone]   Fax:  [phone]  Physical Therapy Treatment  Patient Details  Name: Amanda Mcintosh MRN: [id number] Date of Birth: 01-Mar-1971 Referring Provider (PT): Gregor Hams, MD   Encounter Date: 05/08/2021   PT End of Session - 05/08/21 1143    Visit Number 3    Number of Visits 6    Date for PT Re-Evaluation 06/05/21    PT Start Time 4315    PT Stop Time 1227    PT Time Calculation (min) 44 min    Activity Tolerance Patient tolerated treatment well    Behavior During Therapy [facility] for tasks assessed/performed           Past Medical History:  Diagnosis Date  . Fibroids   . Heartburn   . History of headache   . Obesity   . Thyroid condition   . Vitamin D deficiency     Past Surgical History:  Procedure Laterality Date  . CHOLECYSTECTOMY  2006  . ROBOT ASSISTED MYOMECTOMY  2011  . TONSILLECTOMY    . WISDOM TOOTH EXTRACTION      There were no vitals filed for this visit.   Subjective Assessment - 05/08/21 1148    Subjective pt has been doing her stretches, her pain is mostly on the Rt side of the low back and buttocks    Patient Stated Goals improve pain/spasm    Currently in Pain? Yes    Pain Score 3     Pain Location Buttocks    Pain Orientation Right    Pain Descriptors / Indicators Tightness    Pain Type Chronic pain                             [facility] Adult PT Treatment/Exercise - 05/08/21 0001      Lumbar Exercises: Stretches   Active Hamstring Stretch Left;Right   10 reps, holding KTC with strap   Single Knee to Chest Stretch Left;Right   with strap     Lumbar Exercises: Aerobic   Tread Mill L2 x5'   discussed a walking program , starting 5' 3x/day     Lumbar Exercises: Supine   Ab Set 10 reps;5 seconds    Clam 10 reps   single leg with TA engagement   Bridge 10 reps    Bridge with clamshell 10 reps    Single Leg Bridge  10 reps    Isometric Hip Flexion 10 reps;5 seconds   alternating sides     Lumbar Exercises: Quadruped   [place]/Old Horse 15 reps    Other Quadruped Lumbar Exercises childs pose POE      Manual Therapy   Manual Therapy Joint mobilization    Joint Mobilization grade III CPA mobs to sacrum, lumbar and lower thoracic, Bilat UPA mobs lumbar with focus on L4/5 - very hypomobile                    PT Short Term Goals - 04/24/21 1245      PT SHORT TERM GOAL #1   Title independent with initial HEP    Status New    Target Date 05/15/21             PT Long Term Goals - 04/24/21 1246      PT LONG TERM GOAL #1   Title independent with final HEP    Time  6    Period Weeks    Status New    Target Date 05/29/21      PT LONG TERM GOAL #2   Title FOTO score improved to 64 for improved function    Time 6    Period Weeks    Status New    Target Date 06/05/21      PT LONG TERM GOAL #3   Title report ability to walk/stand > 30 min with pain < 3/10 for improved function and mobility    Time 6    Period Weeks    Status New    Target Date 06/05/21      PT LONG TERM GOAL #4   Title perform lumbar ROM without increase in pain for improved function    Time 6    Period Weeks    Status New    Target Date 06/05/21                 Plan - 05/08/21 1149    Clinical Impression Statement Denisa is doing well with her HEP, added in cat/cow and childs pose.  She reports feeling some relief in her low back after mobs and stretching.  She is very weak in her core and would benefit from continue PT to address this.  She may also benefit for dry needling to lumbar paraspinals and gluts. She is going to think about that.  She has a trip planned in a couple weeks and is hoping to be able to walk more with less pain by then.    Rehab Potential Good    PT Frequency 1x / week    PT Duration 6 weeks    PT Treatment/Interventions ADLs/Self Care Home Management;Cryotherapy;Electrical  Stimulation;Moist Heat;Traction;Therapeutic exercise;Therapeutic activities;Functional mobility training;Gait training;Stair training;Ultrasound;Patient/family education;Manual techniques;Taping;Dry needling    PT Next Visit Plan hip/core stability and manual work to low back/gluts possible DN    PT Home Exercise Plan Access Code: [place]    Consulted and Agree with Plan of Care Patient           Patient will benefit from skilled therapeutic intervention in order to improve the following deficits and impairments:  Pain,Increased fascial restricitons,Decreased strength,Increased muscle spasms,Decreased mobility,Decreased activity tolerance,Decreased range of motion,Impaired flexibility,Postural dysfunction  Visit Diagnosis: Chronic right-sided low back pain without sciatica  Abnormal posture  Other symptoms and signs involving the musculoskeletal system     Problem List Patient Active Problem List   Diagnosis Date Noted  . OSA on CPAP 06/24/2019  . Low back pain 04/30/2019  . Dizziness 07/04/2017  . Chronic seasonal allergic rhinitis 01/25/2017  . Chronic sinusitis 01/25/2017  . Lateral epicondylitis 01/13/2016  . Amenorrhea 06/28/2015  . Situational syncope 05/07/2015  . GERD (gastroesophageal reflux disease) 04/21/2015  . Routine general medical examination at a health care facility 04/21/2015  . Unspecified vitamin D deficiency 12/02/2013  . Class 2 severe obesity with serious comorbidity and body mass index (BMI) of 38.0 to 38.9 in adult [facility]) 12/02/2013  . Iron deficiency anemia 12/02/2013  . Tobacco use disorder 12/02/2013  . Migraine without aura, with intractable migraine, so stated, without mention of status migrainosus 03/11/2013  . Family history of early CAD 09/08/2012    Jeral Pinch PT  05/08/2021, 12:37 PM  [facility] Physical Therapy [address] [place], [place], [address] Phone: [phone]   Fax:  [phone]  Name: BARRI NEIDLINGER MRN: [id number] Date of Birth: 1971-07-27

## 2021-05-08 NOTE — Patient Instructions (Signed)

## 2021-05-15 ENCOUNTER — Encounter: Payer: Self-pay | Admitting: Physical Therapy

## 2021-05-15 ENCOUNTER — Other Ambulatory Visit: Payer: Self-pay

## 2021-05-15 ENCOUNTER — Ambulatory Visit (INDEPENDENT_AMBULATORY_CARE_PROVIDER_SITE_OTHER): Payer: No Typology Code available for payment source | Admitting: Physical Therapy

## 2021-05-15 DIAGNOSIS — R293 Abnormal posture: Secondary | ICD-10-CM | POA: Diagnosis not present

## 2021-05-15 DIAGNOSIS — R29898 Other symptoms and signs involving the musculoskeletal system: Secondary | ICD-10-CM

## 2021-05-15 DIAGNOSIS — G8929 Other chronic pain: Secondary | ICD-10-CM

## 2021-05-15 DIAGNOSIS — M545 Low back pain, unspecified: Secondary | ICD-10-CM | POA: Diagnosis not present

## 2021-05-15 NOTE — Therapy (Signed)
[facility] Physical Therapy [address] [place], [place], [address] Phone: [phone]   Fax:  [phone]  Physical Therapy Treatment  Patient Details  Name: Amanda Mcintosh MRN: [id number] Date of Birth: 1971/08/02 Referring Provider (PT): Gregor Hams, MD   Encounter Date: 05/15/2021   PT End of Session - 05/15/21 1246    Visit Number 4    Number of Visits 6    Date for PT Re-Evaluation 06/05/21    PT Start Time 1962   pt arrived late   PT Stop Time 1228    PT Time Calculation (min) 35 min    Activity Tolerance Patient tolerated treatment well    Behavior During Therapy [facility] for tasks assessed/performed           Past Medical History:  Diagnosis Date  . Fibroids   . Heartburn   . History of headache   . Obesity   . Thyroid condition   . Vitamin D deficiency     Past Surgical History:  Procedure Laterality Date  . CHOLECYSTECTOMY  2006  . ROBOT ASSISTED MYOMECTOMY  2011  . TONSILLECTOMY    . WISDOM TOOTH EXTRACTION      There were no vitals filed for this visit.   Subjective Assessment - 05/15/21 1154    Subjective doing pretty well, was helping with a wedding over the weekend and had a spasm, exercises helped.    Patient Stated Goals improve pain/spasm    Currently in Pain? Yes    Pain Score 5     Pain Location Buttocks    Pain Orientation Right    Pain Descriptors / Indicators Tightness    Pain Type Chronic pain    Pain Radiating Towards Rt hip/thigh    Pain Onset More than a month ago    Pain Frequency Intermittent    Aggravating Factors  standing, walking    Pain Relieving Factors lie down, rest, heat                             [facility] Adult PT Treatment/Exercise - 05/15/21 1156      Lumbar Exercises: Stretches   Single Knee to Chest Stretch Left;Right;3 reps;30 seconds   with strap   Piriformis Stretch Right;Left;3 reps;30 seconds    Piriformis Stretch Limitations knee to opposite shoulder       Lumbar Exercises: Aerobic   Recumbent Bike L3 x 6 min      Lumbar Exercises: Supine   Ab Set 10 reps;5 seconds    Clam 10 reps   single leg with TA engagement   Clam Limitations L3 band    Bridge 5 seconds;10 reps                    PT Short Term Goals - 05/15/21 1247      PT SHORT TERM GOAL #1   Title independent with initial HEP    Status Achieved    Target Date 05/15/21             PT Long Term Goals - 05/15/21 1247      PT LONG TERM GOAL #1   Title independent with final HEP    Time 6    Period Weeks    Status On-going    Target Date 06/05/21      PT LONG TERM GOAL #2   Title FOTO score improved to 64 for improved function  Time 6    Period Weeks    Status On-going      PT LONG TERM GOAL #3   Title report ability to walk/stand > 30 min with pain < 3/10 for improved function and mobility    Time 6    Period Weeks    Status On-going      PT LONG TERM GOAL #4   Title perform lumbar ROM without increase in pain for improved function    Time 6    Period Weeks    Status On-going                 Plan - 05/15/21 1247    Clinical Impression Statement Pt tolerated session well today and is independent with HEP to date.  She reports some improvement overall, and able to self manage at home with HEP with spasm over the weekend.  She's going out of town until 6/1 and will determine if additional PT is needed when she returns.    Rehab Potential Good    PT Frequency 1x / week    PT Duration 6 weeks    PT Treatment/Interventions ADLs/Self Care Home Management;Cryotherapy;Electrical Stimulation;Moist Heat;Traction;Therapeutic exercise;Therapeutic activities;Functional mobility training;Gait training;Stair training;Ultrasound;Patient/family education;Manual techniques;Taping;Dry needling    PT Next Visit Plan hip/core stability and manual work to low back/gluts possible DN (doesn't want DN at this time); will need recert or d/c    PT Home Exercise Plan  Access Code: [place] and Agree with Plan of Care Patient           Patient will benefit from skilled therapeutic intervention in order to improve the following deficits and impairments:  Pain,Increased fascial restricitons,Decreased strength,Increased muscle spasms,Decreased mobility,Decreased activity tolerance,Decreased range of motion,Impaired flexibility,Postural dysfunction  Visit Diagnosis: Chronic right-sided low back pain without sciatica  Abnormal posture  Other symptoms and signs involving the musculoskeletal system     Problem List Patient Active Problem List   Diagnosis Date Noted  . OSA on CPAP 06/24/2019  . Low back pain 04/30/2019  . Dizziness 07/04/2017  . Chronic seasonal allergic rhinitis 01/25/2017  . Chronic sinusitis 01/25/2017  . Lateral epicondylitis 01/13/2016  . Amenorrhea 06/28/2015  . Situational syncope 05/07/2015  . GERD (gastroesophageal reflux disease) 04/21/2015  . Routine general medical examination at a health care facility 04/21/2015  . Unspecified vitamin D deficiency 12/02/2013  . Class 2 severe obesity with serious comorbidity and body mass index (BMI) of 38.0 to 38.9 in adult [facility]) 12/02/2013  . Iron deficiency anemia 12/02/2013  . Tobacco use disorder 12/02/2013  . Migraine without aura, with intractable migraine, so stated, without mention of status migrainosus 03/11/2013  . Family history of early CAD 09/08/2012      Laureen Abrahams, PT, DPT 05/15/21 12:49 PM    [facility] Physical Therapy [address] [place], [place], [address] Phone: [phone]   Fax:  [phone]  Name: Amanda Mcintosh MRN: [id number] Date of Birth: 07-30-1971

## 2021-05-26 ENCOUNTER — Other Ambulatory Visit: Payer: Self-pay | Admitting: Family

## 2021-06-01 ENCOUNTER — Encounter: Payer: Self-pay | Admitting: Physical Therapy

## 2021-06-01 ENCOUNTER — Other Ambulatory Visit: Payer: Self-pay

## 2021-06-01 ENCOUNTER — Ambulatory Visit (INDEPENDENT_AMBULATORY_CARE_PROVIDER_SITE_OTHER): Payer: No Typology Code available for payment source | Admitting: Physical Therapy

## 2021-06-01 DIAGNOSIS — R29898 Other symptoms and signs involving the musculoskeletal system: Secondary | ICD-10-CM

## 2021-06-01 DIAGNOSIS — R293 Abnormal posture: Secondary | ICD-10-CM

## 2021-06-01 DIAGNOSIS — M545 Low back pain, unspecified: Secondary | ICD-10-CM | POA: Diagnosis not present

## 2021-06-01 DIAGNOSIS — G8929 Other chronic pain: Secondary | ICD-10-CM

## 2021-06-01 NOTE — Therapy (Signed)
[place], [place], [address] Phone: [phone]   Fax:  [phone]  Physical Therapy Treatment/Discharge Summary  Patient Details  Name: Amanda Mcintosh MRN: [id number] Date of Birth: Mar 19, 1971 Referring Provider (PT): Gregor Hams, MD   Encounter Date: 06/01/2021   PT End of Session - 06/01/21 1151    Visit Number 5    Number of Visits 6    Date for PT Re-Evaluation 06/05/21    PT Start Time 1140    PT Stop Time 1213    PT Time Calculation (min) 33 min    Activity Tolerance Patient tolerated treatment well    Behavior During Therapy [facility] for tasks assessed/performed           Past Medical History:  Diagnosis Date  . Fibroids   . Heartburn   . History of headache   . Obesity   . Thyroid condition   . Vitamin D deficiency     Past Surgical History:  Procedure Laterality Date  . CHOLECYSTECTOMY  2006  . ROBOT ASSISTED MYOMECTOMY  2011  . TONSILLECTOMY    . WISDOM TOOTH EXTRACTION      There were no vitals filed for this visit.   Subjective Assessment - 06/01/21 1140    Subjective returned from trip yesterday, reports she was doing well.  back to work today with meetings and back is a lttle painful today.    How long can you stand comfortably? can stand, does get spasm anywhere from 5 min - longer    Patient Stated Goals improve pain/spasm    Currently in Pain? Yes    Pain Score 5     Pain Location Buttocks    Pain Orientation Right    Pain Descriptors / Indicators Tightness    Pain Type Chronic pain    Pain Onset More than a month ago    Pain Frequency Intermittent    Aggravating Factors  standing, walking, sitting on train    Pain Relieving Factors lie down, rest, heat              [facility] PT Assessment - 06/01/21 1156      Assessment   Medical Diagnosis M54.50,G89.29 (ICD-10-CM) - Chronic bilateral low back pain without sciatica    Referring Provider (PT) Gregor Hams, MD       Observation/Other Assessments   Focus on Therapeutic Outcomes (FOTO)  63 (predicted 64)      AROM   Lumbar Flexion WNL    Lumbar Extension WNL    Lumbar - Right Side Bend WNL    Lumbar - Left Side Bend WNL    Lumbar - Right Rotation WNL    Lumbar - Left Rotation WNL                         [facility] Adult PT Treatment/Exercise - 06/01/21 1145      Self-Care   Self-Care Other Self-Care Comments    Other Self-Care Comments  use of percussive device for self mobilization      Lumbar Exercises: Stretches   Single Knee to Chest Stretch Left;Right;3 reps;30 seconds    Piriformis Stretch Right;Left;3 reps;30 seconds    Piriformis Stretch Limitations knee to opposite shoulder      Lumbar Exercises: Aerobic   Nustep L6 x 8 min      Lumbar Exercises: Supine   Ab Set 10 reps;5 seconds      Lumbar Exercises:  Quadruped   [place]/Old Horse 10 reps    Other Quadruped Lumbar Exercises childs pose mid/Lt/Rt                    PT Short Term Goals - 05/15/21 1247      PT SHORT TERM GOAL #1   Title independent with initial HEP    Status Achieved    Target Date 05/15/21             PT Long Term Goals - 06/01/21 1236      PT LONG TERM GOAL #1   Title independent with final HEP    Time 6    Period Weeks    Status Achieved      PT LONG TERM GOAL #2   Title FOTO score improved to 64 for improved function    Baseline 6/2: 63    Time 6    Period Weeks    Status Partially Met      PT LONG TERM GOAL #3   Title report ability to walk/stand > 30 min with pain < 3/10 for improved function and mobility    Time 6    Period Weeks    Status Partially Met      PT LONG TERM GOAL #4   Title perform lumbar ROM without increase in pain for improved function    Time 6    Period Weeks    Status Achieved                 Plan - 06/01/21 1236    Clinical Impression Statement Pt has met/partially met all goals and feels ready for d/c.  FOTO score improved and  only 1% from predicted goal.  Will d/c PT today.    Rehab Potential Good    PT Frequency 1x / week    PT Duration 6 weeks    PT Treatment/Interventions ADLs/Self Care Home Management;Cryotherapy;Electrical Stimulation;Moist Heat;Traction;Therapeutic exercise;Therapeutic activities;Functional mobility training;Gait training;Stair training;Ultrasound;Patient/family education;Manual techniques;Taping;Dry needling    PT Next Visit Plan d/c PT today    PT Home Exercise Plan Access Code: [email or web address]    Consulted and Agree with Plan of Care Patient           Patient will benefit from skilled therapeutic intervention in order to improve the following deficits and impairments:  Pain,Increased fascial restricitons,Decreased strength,Increased muscle spasms,Decreased mobility,Decreased activity tolerance,Decreased range of motion,Impaired flexibility,Postural dysfunction  Visit Diagnosis: Chronic right-sided low back pain without sciatica  Abnormal posture  Other symptoms and signs involving the musculoskeletal system     Problem List Patient Active Problem List   Diagnosis Date Noted  . OSA on CPAP 06/24/2019  . Low back pain 04/30/2019  . Dizziness 07/04/2017  . Chronic seasonal allergic rhinitis 01/25/2017  . Chronic sinusitis 01/25/2017  . Lateral epicondylitis 01/13/2016  . Amenorrhea 06/28/2015  . Situational syncope 05/07/2015  . GERD (gastroesophageal reflux disease) 04/21/2015  . Routine general medical examination at a health care facility 04/21/2015  . Unspecified vitamin D deficiency 12/02/2013  . Class 2 severe obesity with serious comorbidity and body mass index (BMI) of 38.0 to 38.9 in adult [facility]) 12/02/2013  . Iron deficiency anemia 12/02/2013  . Tobacco use disorder 12/02/2013  . Migraine without aura, with intractable migraine, so stated, without mention of status migrainosus 03/11/2013  . Family history of early CAD 09/08/2012      Laureen Abrahams, PT,  DPT 06/01/21 12:37 PM    [facility] [phone]  [place], [place], [address] Phone: [phone]   Fax:  [phone]  Name: Amanda Mcintosh MRN: [id number] Date of Birth: 1971-06-16     PHYSICAL THERAPY DISCHARGE SUMMARY  Visits from Start of Care: 5  Current functional level related to goals / functional outcomes: See above   Remaining deficits: See above   Education / Equipment: HEP  Plan: Patient agrees to discharge.  Patient goals were partially met. Patient is being discharged due to being pleased with the current functional level.  ?????     Laureen Abrahams, PT, DPT 06/01/21 12:38 PM   [facility] [address] [place], [place], [address] Phone: [phone]   Fax:  [phone]

## 2021-06-05 NOTE — Progress Notes (Signed)
   I, Wendy Poet, LAT, ATC, am serving as scribe for Dr. Lynne Leader.  Amanda Mcintosh is a 50 y.o. female who presents to [facility] today for f/u chronic LBP. Pt was last seen by Dr. Georgina Snell on 04/18/21 and was advised to use a heating pad, TENS unit, and was referred to PT of which she's completed 5 visits. Pt was d/c from PT on 06/01/21. Today, pt reports that her low back pain has improved.  She states that she con't to have pain and will have intermittent R-sided low back spasms if she's standing for too long.  Pt has been doing her HEP as directed by PT.  Dx imaging: 04/18/21 L-spine XR             01/05/20 L-spine XR  Pertinent review of systems: No fevers or chills  Relevant historical information: Migraine headache.  Obesity.   Exam:  BP 92/62 (BP Location: Right Arm, Patient Position: Sitting, Cuff Size: Large)   Ht 5\' 5"  (1.651 m)   Wt 262 lb 12.8 oz (119.2 kg)   LMP 02/28/2017   BMI 43.73 kg/m  General: Obese, well appearing, and in no acute distress.   MSK: L-spine normal.  Nontender normal lumbar motion.    Lab and Radiology Results  EXAM: LUMBAR SPINE - 2-3 VIEW  COMPARISON:  01/05/2020  FINDINGS: Frontal and lateral views of the lumbar spine are obtained. There are 5 non-rib-bearing lumbar type vertebral bodies in anatomic alignment. No acute fractures. Mild facet hypertrophy at L4-5 and L5-S1 unchanged. Disc spaces are well preserved. Sacroiliac joints are normal.  IMPRESSION: 1. Stable lumbar spine.  No acute process.   Electronically Signed   By: Randa Ngo M.D.   On: 04/19/2021 11:44  I, Lynne Leader, personally (independently) visualized and performed the interpretation of the images attached in this note.     Assessment and Plan: 50 y.o. female with low back pain.  Patient is now back to her chronic low back pain after suffering an acute exacerbation which improved with physical therapy.  Plan to  continue home exercise program and I will happily order more physical therapy in the future should she need it.  Discussed precautions and expectations.  If needed for management further chronic back pain could consider MRI for facet injection planning her symptoms or not sufficient enough at this time is warranted.    She mentioned that she thinks that her obesity may be a factor in her back pain.  I agree and we discussed some obesity management strategies.  She discussed exercise and we discussed some exercise management strategies.  She also discussed diet and nutrition.  I recommend [place] with healthy weight and wellness..    Discussed warning signs or symptoms. Please see discharge instructions. Patient expresses understanding.   The above documentation has been reviewed and is accurate and complete Lynne Leader, M.D.    Total encounter time 20 minutes including face-to-face time with the patient and, reviewing past medical record, and charting on the date of service.

## 2021-06-06 ENCOUNTER — Other Ambulatory Visit: Payer: Self-pay

## 2021-06-06 ENCOUNTER — Ambulatory Visit (INDEPENDENT_AMBULATORY_CARE_PROVIDER_SITE_OTHER): Payer: No Typology Code available for payment source | Admitting: Family Medicine

## 2021-06-06 ENCOUNTER — Encounter: Payer: Self-pay | Admitting: Family Medicine

## 2021-06-06 VITALS — BP 92/62 | Ht 65.0 in | Wt 262.8 lb

## 2021-06-06 DIAGNOSIS — G8929 Other chronic pain: Secondary | ICD-10-CM | POA: Diagnosis not present

## 2021-06-06 DIAGNOSIS — M545 Low back pain, unspecified: Secondary | ICD-10-CM | POA: Diagnosis not present

## 2021-06-06 NOTE — Patient Instructions (Signed)
Thank you for coming in today.  Continue the home exercises.   I can re-order the PT int he future if needed.   There are back injections we could do but that would require MRI first and I am not optimistic about the back injection at this time.

## 2021-06-29 ENCOUNTER — Encounter: Payer: No Typology Code available for payment source | Attending: Family | Admitting: Registered"

## 2021-06-29 ENCOUNTER — Encounter: Payer: Self-pay | Admitting: Registered"

## 2021-06-29 ENCOUNTER — Other Ambulatory Visit: Payer: Self-pay

## 2021-06-29 DIAGNOSIS — R7303 Prediabetes: Secondary | ICD-10-CM | POA: Insufficient documentation

## 2021-06-29 NOTE — Patient Instructions (Addendum)
-   Aim to have 1/2 plate of non-starchy vegetables + 1/4 plate of lean protein + 1/4 plate of starch/grain for lunch and dinner.   - Aim to eat about every 3-5 hours.   - Snacks to include source of protein + source of carbohydrates: Almonds + popcorn Granola bar Trail mix Cheese and crackers   - Aim to walk on at least one 15 minute break during work day.

## 2021-06-29 NOTE — Progress Notes (Signed)
Medical Nutrition Therapy  Appointment Start time:  3:19  Appointment End time:  4:12  Primary concerns today: more information to lower weight and reduce elevated A1c  Referral diagnosis: prediabetes Preferred learning style: no preference indicated Learning readiness: ready, change in progress   NUTRITION ASSESSMENT   Pt states she wants to talk about eating habits to improve A1C diagnosis because she does not want to take medication. Reports mom and maternal grandmother had diabetes. States she knows what to eat to be healthy but forgets to eat sometimes while working from home. Reports work is busy; sometimes working longer hours.   States she typically skips breakfast and lunch; cooks dinner mostly. States she prepares the meals in her home. States she likes to eat vegetables. Reports she will cook tilapia with spinach at times and a variety of other baked proteins with frozen vegetables. States she does not like to eat rice or bread. Reports snacking on chips and deli meat rolled up or almonds. States she does not like sweets and sometimes will not be in the mood for fruit.   States she has sleep apnea machine and uses it. States she smokes off and on but will smoke when she gets stressed out. States she is trying to quit. States she can stop when she puts her mind to it. Denies having a therapist. Reports seeing one during the time of her mom passing away but feels she knows how to manage her stress now.   States she works Mon-Fri 8-5 pm mostly. States work is busy. Lives with husband.    Clinical Medical Hx: GERD, prediabetes Medications: See list Labs: elevated A1c (6.0), elevated Chol (223), elevated Trg (159), lowered HDL (34.7), elevated LDL Chol (156) Notable Signs/Symptoms: none reported  Lifestyle & Dietary Hx  Estimated daily fluid intake: ~48 oz Supplements: See list Sleep: 5-6 hrs/night Stress / self-care: smoke a cigarette, vacation, reads Bible Current average  weekly physical activity: none reported  24-Hr Dietary Recall First Meal (10:30 am): 2 eggs + 1/2 slice of bacon + water Snack:  Second Meal: skipped Snack:  Third Meal (5 pm): meatloaf + 1 scoop mashed potatoes + green beans + water Snack: Klondike bar Beverages: water (3.5*16 oz; 42 oz), ginger ale (6 oz)   NUTRITION DIAGNOSIS  NB-1.1 Food and nutrition-related knowledge deficit As related to prediabetes.  As evidenced by pt verbalizes incomplete information.   NUTRITION INTERVENTION  Nutrition education (E-1) on the following topics: Pt was educated and counseled on metabolism, importance of not skipping meals, effects of dieting/restriction, how carbohydrates work in the body, prediabetes/diabetes risk factors, A1c ranges for prediabetes/diabetes, role of fiber in eating regimen, and importance of physical activity. Discussed meal/snack planning and how to balance meals/snacks using MyPlate for prediabetes. Pt agreed with goals listed.  Handouts Provided Include  My Plate for Prediabetes  Learning Style & Readiness for Change Teaching method utilized: Visual & Auditory  Demonstrated degree of understanding via: Teach Back  Barriers to learning/adherence to lifestyle change: work-life balance  Goals Established by Pt Aim to have 1/2 plate of non-starchy vegetables + 1/4 plate of lean protein + 1/4 plate of starch/grain for lunch and dinner.  Aim to eat about every 3-5 hours.  Snacks to include source of protein + source of carbohydrates: Almonds + popcorn Granola bar Trail mix Cheese and crackers Aim to walk on at least one 15 minute break during work day.    MONITORING & EVALUATION Dietary intake, weekly physical activity.  Next Steps  Patient is to follow-up prn.

## 2021-08-29 ENCOUNTER — Telehealth (INDEPENDENT_AMBULATORY_CARE_PROVIDER_SITE_OTHER): Payer: No Typology Code available for payment source | Admitting: Family

## 2021-08-29 ENCOUNTER — Encounter: Payer: Self-pay | Admitting: Family

## 2021-08-29 ENCOUNTER — Other Ambulatory Visit: Payer: Self-pay

## 2021-08-29 VITALS — Ht 65.0 in | Wt 263.0 lb

## 2021-08-29 DIAGNOSIS — U071 COVID-19: Secondary | ICD-10-CM | POA: Diagnosis not present

## 2021-08-29 MED ORDER — MOLNUPIRAVIR EUA 200MG CAPSULE: 4.0000 | ORAL_CAPSULE | Freq: Two times a day (BID) | ORAL | 0 refills | Status: AC

## 2021-08-29 NOTE — Progress Notes (Signed)
Amanda Mcintosh is a 50 y.o. female with the following history as recorded in EpicCare:  Patient Active Problem List   Diagnosis Date Noted   OSA on CPAP 06/24/2019   Low back pain 04/30/2019   Dizziness 07/04/2017   Chronic seasonal allergic rhinitis 01/25/2017   Chronic sinusitis 01/25/2017   Lateral epicondylitis 01/13/2016   Amenorrhea 06/28/2015   Situational syncope 05/07/2015   GERD (gastroesophageal reflux disease) 04/21/2015   Routine general medical examination at a health care facility 04/21/2015   Unspecified vitamin D deficiency 12/02/2013   Class 2 severe obesity with serious comorbidity and body mass index (BMI) of 38.0 to 38.9 in adult ([facility]) 12/02/2013   Iron deficiency anemia 12/02/2013   Tobacco use disorder 12/02/2013   Migraine without aura, with intractable migraine, so stated, without mention of status migrainosus 03/11/2013   Family history of early CAD 09/08/2012    Current Outpatient Medications  Medication Sig Dispense Refill   Ascorbic Acid (VITAMIN C) 1000 MG tablet Take 1,000 mg by mouth daily.     cholecalciferol (VITAMIN D3) 25 MCG (1000 UT) tablet Take 1,000 Units by mouth daily.     cyclobenzaprine (FLEXERIL) 5 MG tablet Take 1 tablet (5 mg total) by mouth 3 (three) times daily as needed for muscle spasms. 30 tablet 0   molnupiravir EUA 200 mg CAPS Take 4 capsules (800 mg total) by mouth 2 (two) times daily for 5 days. 40 capsule 0   Multiple Vitamin (MULTIVITAMIN WITH MINERALS) TABS tablet Take 1 tablet by mouth daily.      Turmeric 500 MG TABS Take 500 mg by mouth daily.     valsartan-hydrochlorothiazide (DIOVAN HCT) 160-25 MG tablet Take 1 tablet by mouth daily. 90 tablet 0   vitamin B-12 (CYANOCOBALAMIN) 100 MCG tablet Take 100 mcg by mouth daily.     vitamin E 1000 UNIT capsule Take 1,000 Units by mouth daily.     vitamin k 100 MCG tablet Take 100 mcg by mouth daily.     Zinc 50 MG CAPS Take by mouth.     escitalopram (LEXAPRO) 10 MG tablet  TAKE 1 TABLET BY MOUTH EVERY DAY (Patient not taking: Reported on 06/29/2021) 90 tablet 1   No current facility-administered medications for this visit.    Allergies: Gadolinium derivatives  Past Medical History:  Diagnosis Date   Fibroids    Heartburn    History of headache    Hyperlipidemia    Hypertension    Obesity    Thyroid condition    Vitamin D deficiency     Past Surgical History:  Procedure Laterality Date   CHOLECYSTECTOMY  2006   ROBOT ASSISTED MYOMECTOMY  2011   TONSILLECTOMY     WISDOM TOOTH EXTRACTION      Family History  Problem Relation Age of Onset   Diabetes Mother    Hypertension Mother    Heart failure Mother    Stroke Mother    High Cholesterol Mother    Depression Mother    Anxiety disorder Mother    Sleep apnea Mother    Obesity Mother    COPD Father        smoker   High blood pressure Father    High Cholesterol Father    Breast cancer Sister    Skin cancer Sister    HIV/AIDS Brother    Hyperlipidemia Other     Social History   Tobacco Use   Smoking status: Some Days    Packs/day: 0.30  Types: Cigarettes    Last attempt to quit: 12/01/2019    Years since quitting: 1.7   Smokeless tobacco: Never   Tobacco comments:    Working on cutting back  Substance Use Topics   Alcohol use: No    Subjective:    I connected with Sharlee Blew on 08/29/21 at  8:40 AM EDT by a video enabled telemedicine application and verified that I am speaking with the correct person using two identifiers.   I discussed the limitations of evaluation and management by telemedicine and the availability of in person appointments. The patient expressed understanding and agreed to proceed. Provider in office/ patient is at home; provider and patient are only 2 people on video call.   Tested positive for COVID on 08/28/21; notes that symptoms started 3 days ago with headache/ body aches, cough and congestion; "feels like I have a really bad flu." Wondering if  she needs a Z-pak to help with the congestion; not taking any OTC medication at this time.     Objective:  Vitals:   08/29/21 0837  Weight: 263 lb (119.3 kg)  Height: '5\' 5"'$  (1.651 m)    General: Well developed, well nourished, in no acute distress  Skin : Warm and dry.  Head: Normocephalic and atraumatic  Lungs: Respirations unlabored;  Neurologic: Alert and oriented; speech intact; face symmetrical;   Assessment:  1. COVID-19     Plan:  Rx for Molnupiravir- take as directed; okay to use Mucinex D for symptoms- stressed to only use for a few days due to her hypertension; stressed need to remain hydrated and rest; ER precautions discussed; follow up worse, no better.   No follow-ups on file.  No orders of the defined types were placed in this encounter.   Requested Prescriptions   Signed Prescriptions Disp Refills   molnupiravir EUA 200 mg CAPS 40 capsule 0    Sig: Take 4 capsules (800 mg total) by mouth 2 (two) times daily for 5 days.

## 2021-08-30 ENCOUNTER — Other Ambulatory Visit: Payer: Self-pay | Admitting: Family

## 2021-09-05 ENCOUNTER — Encounter: Payer: Self-pay | Admitting: Family

## 2021-09-05 ENCOUNTER — Telehealth: Payer: Self-pay

## 2021-09-05 ENCOUNTER — Other Ambulatory Visit: Payer: Self-pay

## 2021-09-05 ENCOUNTER — Telehealth (INDEPENDENT_AMBULATORY_CARE_PROVIDER_SITE_OTHER): Payer: No Typology Code available for payment source | Admitting: Family

## 2021-09-05 VITALS — Ht 65.0 in | Wt 263.0 lb

## 2021-09-05 DIAGNOSIS — R059 Cough, unspecified: Secondary | ICD-10-CM

## 2021-09-05 DIAGNOSIS — I10 Essential (primary) hypertension: Secondary | ICD-10-CM | POA: Diagnosis not present

## 2021-09-05 DIAGNOSIS — Z0279 Encounter for issue of other medical certificate: Secondary | ICD-10-CM

## 2021-09-05 DIAGNOSIS — U071 COVID-19: Secondary | ICD-10-CM | POA: Diagnosis not present

## 2021-09-05 MED ORDER — VALSARTAN-HYDROCHLOROTHIAZIDE 160-25 MG PO TABS: 1.0000 | ORAL_TABLET | Freq: Every day | ORAL | 0 refills | Status: DC

## 2021-09-05 MED ORDER — DOXYCYCLINE HYCLATE 100 MG PO TABS
100.0000 mg | ORAL_TABLET | Freq: Two times a day (BID) | ORAL | 0 refills | Status: DC
Start: 2021-09-05 — End: 2021-12-12

## 2021-09-05 MED ORDER — BENZONATATE 100 MG PO CAPS: 100.0000 mg | ORAL_CAPSULE | Freq: Three times a day (TID) | ORAL | 0 refills | Status: DC | PRN

## 2021-09-05 NOTE — Telephone Encounter (Signed)
I have called pt to get her triaged for her virtual visit.   Pt stated that she needed FMLA and STD paperwork completed for her job. I have received the STD paperwork and FMLA forms to follow.   I have given the paperwork to provider for review.

## 2021-09-05 NOTE — Telephone Encounter (Signed)
Forms have been completed and faxed to [place] [phone]) companies.   Pt notified.   Copy placed in scan and a copy mailed to pt.

## 2021-09-05 NOTE — Progress Notes (Signed)
Amanda Mcintosh is a 50 y.o. female with the following history as recorded in EpicCare:  Patient Active Problem List   Diagnosis Date Noted   OSA on CPAP 06/24/2019   Low back pain 04/30/2019   Dizziness 07/04/2017   Chronic seasonal allergic rhinitis 01/25/2017   Chronic sinusitis 01/25/2017   Lateral epicondylitis 01/13/2016   Amenorrhea 06/28/2015   Situational syncope 05/07/2015   GERD (gastroesophageal reflux disease) 04/21/2015   Routine general medical examination at a health care facility 04/21/2015   Unspecified vitamin D deficiency 12/02/2013   Class 2 severe obesity with serious comorbidity and body mass index (BMI) of 38.0 to 38.9 in adult ([place]) 12/02/2013   Iron deficiency anemia 12/02/2013   Tobacco use disorder 12/02/2013   Migraine without aura, with intractable migraine, so stated, without mention of status migrainosus 03/11/2013   Family history of early CAD 09/08/2012    Current Outpatient Medications  Medication Sig Dispense Refill   Ascorbic Acid (VITAMIN C) 1000 MG tablet Take 1,000 mg by mouth daily.     benzonatate (TESSALON) 100 MG capsule Take 1 capsule (100 mg total) by mouth 3 (three) times daily as needed. 20 capsule 0   cholecalciferol (VITAMIN D3) 25 MCG (1000 UT) tablet Take 1,000 Units by mouth daily.     cyclobenzaprine (FLEXERIL) 5 MG tablet Take 1 tablet (5 mg total) by mouth 3 (three) times daily as needed for muscle spasms. 30 tablet 0   doxycycline (VIBRA-TABS) 100 MG tablet Take 1 tablet (100 mg total) by mouth 2 (two) times daily. 14 tablet 0   escitalopram (LEXAPRO) 10 MG tablet TAKE 1 TABLET BY MOUTH EVERY DAY 90 tablet 1   Multiple Vitamin (MULTIVITAMIN WITH MINERALS) TABS tablet Take 1 tablet by mouth daily.      Turmeric 500 MG TABS Take 500 mg by mouth daily.     vitamin B-12 (CYANOCOBALAMIN) 100 MCG tablet Take 100 mcg by mouth daily.     vitamin E 1000 UNIT capsule Take 1,000 Units by mouth daily.     vitamin k 100 MCG tablet Take  100 mcg by mouth daily.     Zinc 50 MG CAPS Take by mouth.     valsartan-hydrochlorothiazide (DIOVAN HCT) 160-25 MG tablet Take 1 tablet by mouth daily. 90 tablet 0   No current facility-administered medications for this visit.    Allergies: Gadolinium derivatives  Past Medical History:  Diagnosis Date   Fibroids    Heartburn    History of headache    Hyperlipidemia    Hypertension    Obesity    Thyroid condition    Vitamin D deficiency     Past Surgical History:  Procedure Laterality Date   CHOLECYSTECTOMY  2006   ROBOT ASSISTED MYOMECTOMY  2011   TONSILLECTOMY     WISDOM TOOTH EXTRACTION      Family History  Problem Relation Age of Onset   Diabetes Mother    Hypertension Mother    Heart failure Mother    Stroke Mother    High Cholesterol Mother    Depression Mother    Anxiety disorder Mother    Sleep apnea Mother    Obesity Mother    COPD Father        smoker   High blood pressure Father    High Cholesterol Father    Breast cancer Sister    Skin cancer Sister    HIV/AIDS Brother    Hyperlipidemia Other     Social History  Tobacco Use   Smoking status: Some Days    Packs/day: 0.30    Types: Cigarettes    Last attempt to quit: 12/01/2019    Years since quitting: 1.7   Smokeless tobacco: Never   Tobacco comments:    Working on cutting back  Substance Use Topics   Alcohol use: No    Subjective:    I connected with Sharlee Blew on 09/05/21 at  3:00 PM EDT by a video enabled telemedicine application and verified that I am speaking with the correct person using two identifiers.   I discussed the limitations of evaluation and management by telemedicine and the availability of in person appointments. The patient expressed understanding and agreed to proceed. Provider in office/ patient is at home; provider and patient are only 2 people on video call.   Patient was seen last Monday with concerns for COVID 19; notes that cough has persisted and not  productive/ discolored; mentions that left side of her chest feels sore from coughing; denies any fever, chest pain or shortness of breath; no further fevers; Has not been able to work since last Monday, 08/28/21; per her HR, she needs FMLA and short term disability paperwork; HR is planning to have her return to work on 09/13/21;   Also needing refill on blood pressure medication; tolerating with no issues;       Objective:  Vitals:   09/05/21 1433  Weight: 263 lb (119.3 kg)  Height: '5\' 5"'$  (1.651 m)    General: Well developed, well nourished, in no acute distress  Skin : Warm and dry.  Head: Normocephalic and atraumatic  Lungs: Respirations unlabored;  Neurologic: Alert and oriented; speech intact; face symmetrical; moves all extremities well; CNII-XII intact without focal deficit   Assessment:  1. Cough   2. COVID-19   3. Primary hypertension     Plan:  Concern for secondary infection secondary to COVID; she will go get Cxr tomorrow; start Doxycyline and Tessalon Perles; Refill updated on blood pressure medication;  Follow up to be determined;    No follow-ups on file.  Orders Placed This Encounter  Procedures   DG Chest 2 View    Standing Status:   Future    Standing Expiration Date:   09/05/2022    Order Specific Question:   Reason for Exam (SYMPTOM  OR DIAGNOSIS REQUIRED)    Answer:   cough/ s/p COVID    Order Specific Question:   Is patient pregnant?    Answer:   No    Order Specific Question:   Preferred imaging location?    Answer:   [facility]    Requested Prescriptions   Signed Prescriptions Disp Refills   valsartan-hydrochlorothiazide (DIOVAN HCT) 160-25 MG tablet 90 tablet 0    Sig: Take 1 tablet by mouth daily.   doxycycline (VIBRA-TABS) 100 MG tablet 14 tablet 0    Sig: Take 1 tablet (100 mg total) by mouth 2 (two) times daily.   benzonatate (TESSALON) 100 MG capsule 20 capsule 0    Sig: Take 1 capsule (100 mg total) by mouth 3 (three) times daily  as needed.

## 2021-09-06 ENCOUNTER — Other Ambulatory Visit: Payer: Self-pay

## 2021-09-06 ENCOUNTER — Ambulatory Visit (INDEPENDENT_AMBULATORY_CARE_PROVIDER_SITE_OTHER)
Admission: RE | Admit: 2021-09-06 | Discharge: 2021-09-06 | Disposition: A | Discharge status: Home / Self Care | Payer: No Typology Code available for payment source | Source: Ambulatory Visit | Attending: Family | Admitting: Family

## 2021-09-06 DIAGNOSIS — R059 Cough, unspecified: Secondary | ICD-10-CM | POA: Diagnosis not present

## 2021-10-26 LAB — HM MAMMOGRAPHY

## 2021-10-30 ENCOUNTER — Encounter: Payer: Self-pay | Admitting: Family

## 2021-10-30 NOTE — Progress Notes (Signed)
Updated Mammogram.

## 2021-11-27 ENCOUNTER — Other Ambulatory Visit: Payer: Self-pay | Admitting: Family

## 2021-12-12 ENCOUNTER — Other Ambulatory Visit: Payer: Self-pay | Admitting: Family

## 2021-12-12 ENCOUNTER — Encounter: Payer: Self-pay | Admitting: Family

## 2021-12-12 ENCOUNTER — Ambulatory Visit (INDEPENDENT_AMBULATORY_CARE_PROVIDER_SITE_OTHER): Payer: No Typology Code available for payment source | Admitting: Family

## 2021-12-12 VITALS — BP 130/80 | HR 74 | Temp 98.0°F | Ht 65.0 in | Wt 273.4 lb

## 2021-12-12 DIAGNOSIS — Z1159 Encounter for screening for other viral diseases: Secondary | ICD-10-CM | POA: Diagnosis not present

## 2021-12-12 DIAGNOSIS — Z23 Encounter for immunization: Secondary | ICD-10-CM

## 2021-12-12 DIAGNOSIS — E559 Vitamin D deficiency, unspecified: Secondary | ICD-10-CM

## 2021-12-12 DIAGNOSIS — R200 Anesthesia of skin: Secondary | ICD-10-CM | POA: Diagnosis not present

## 2021-12-12 DIAGNOSIS — D72829 Elevated white blood cell count, unspecified: Secondary | ICD-10-CM

## 2021-12-12 DIAGNOSIS — R202 Paresthesia of skin: Secondary | ICD-10-CM

## 2021-12-12 DIAGNOSIS — I10 Essential (primary) hypertension: Secondary | ICD-10-CM

## 2021-12-12 DIAGNOSIS — R7309 Other abnormal glucose: Secondary | ICD-10-CM

## 2021-12-12 MED ORDER — VALSARTAN-HYDROCHLOROTHIAZIDE 160-25 MG PO TABS: 1.0000 | ORAL_TABLET | Freq: Every day | ORAL | 1 refills | Status: DC

## 2021-12-12 NOTE — Progress Notes (Signed)
Amanda Mcintosh is a 50 y.o. female with the following history as recorded in EpicCare:  Patient Active Problem List   Diagnosis Date Noted   OSA on CPAP 06/24/2019   Low back pain 04/30/2019   Dizziness 07/04/2017   Chronic seasonal allergic rhinitis 01/25/2017   Chronic sinusitis 01/25/2017   Lateral epicondylitis 01/13/2016   Amenorrhea 06/28/2015   Situational syncope 05/07/2015   GERD (gastroesophageal reflux disease) 04/21/2015   Routine general medical examination at a health care facility 04/21/2015   Unspecified vitamin D deficiency 12/02/2013   Class 2 severe obesity with serious comorbidity and body mass index (BMI) of 38.0 to 38.9 in adult ([place]) 12/02/2013   Iron deficiency anemia 12/02/2013   Tobacco use disorder 12/02/2013   Migraine without aura, with intractable migraine, so stated, without mention of status migrainosus 03/11/2013   Family history of early CAD 09/08/2012    Current Outpatient Medications  Medication Sig Dispense Refill   Ascorbic Acid (VITAMIN C) 1000 MG tablet Take 1,000 mg by mouth daily.     cyclobenzaprine (FLEXERIL) 5 MG tablet Take 1 tablet (5 mg total) by mouth 3 (three) times daily as needed for muscle spasms. 30 tablet 0   escitalopram (LEXAPRO) 10 MG tablet TAKE 1 TABLET BY MOUTH EVERY DAY 90 tablet 1   cholecalciferol (VITAMIN D3) 25 MCG (1000 UT) tablet Take 1,000 Units by mouth daily. (Patient not taking: Reported on 12/12/2021)     Multiple Vitamin (MULTIVITAMIN WITH MINERALS) TABS tablet Take 1 tablet by mouth daily.  (Patient not taking: Reported on 12/12/2021)     Turmeric 500 MG TABS Take 500 mg by mouth daily. (Patient not taking: Reported on 12/12/2021)     valsartan-hydrochlorothiazide (DIOVAN HCT) 160-25 MG tablet Take 1 tablet by mouth daily. 90 tablet 1   vitamin B-12 (CYANOCOBALAMIN) 100 MCG tablet Take 100 mcg by mouth daily. (Patient not taking: Reported on 12/12/2021)     vitamin E 1000 UNIT capsule Take 1,000 Units by  mouth daily. (Patient not taking: Reported on 12/12/2021)     vitamin k 100 MCG tablet Take 100 mcg by mouth daily. (Patient not taking: Reported on 12/12/2021)     Zinc 50 MG CAPS Take by mouth. (Patient not taking: Reported on 12/12/2021)     No current facility-administered medications for this visit.    Allergies: Gadolinium derivatives  Past Medical History:  Diagnosis Date   Fibroids    Heartburn    History of headache    Hyperlipidemia    Hypertension    Obesity    Thyroid condition    Vitamin D deficiency     Past Surgical History:  Procedure Laterality Date   CHOLECYSTECTOMY  2006   ROBOT ASSISTED MYOMECTOMY  2011   TONSILLECTOMY     WISDOM TOOTH EXTRACTION      Family History  Problem Relation Age of Onset   Diabetes Mother    Hypertension Mother    Heart failure Mother    Stroke Mother    High Cholesterol Mother    Depression Mother    Anxiety disorder Mother    Sleep apnea Mother    Obesity Mother    COPD Father        smoker   High blood pressure Father    High Cholesterol Father    Breast cancer Sister    Skin cancer Sister    HIV/AIDS Brother    Hyperlipidemia Other     Social History   Tobacco Use  Smoking status: Some Days    Packs/day: 0.30    Types: Cigarettes    Last attempt to quit: 12/01/2019    Years since quitting: 2.0   Smokeless tobacco: Never   Tobacco comments:    Working on cutting back  Substance Use Topics   Alcohol use: No    Subjective:  Follow up on hypertension/ situational anxiety; has been off both Lexapro and Valsartan HCT- requesting refills today;  Admits that stress level has been elevated- husband has been diagnosed with prostate cancer recently;    Defers flu shot/ agrees to pneumonia shot- is a smoker;    Objective:  Vitals:   12/12/21 1419 12/12/21 1425  BP: 138/70 130/80  Pulse: 74   Temp: 98 F (36.7 C)   TempSrc: Oral   SpO2: 98%   Weight: 273 lb 6.4 oz (124 kg)   Height: '5\' 5"'  (1.651 m)      General: Well developed, well nourished, in no acute distress  Skin : Warm and dry.  Head: Normocephalic and atraumatic  Eyes: Sclera and conjunctiva clear; pupils round and reactive to light; extraocular movements intact  Ears: External normal; canals clear; tympanic membranes normal  Oropharynx: Pink, supple. No suspicious lesions  Neck: Supple without thyromegaly, adenopathy  Lungs: Respirations unlabored; clear to auscultation bilaterally without wheeze, rales, rhonchi  CVS exam: normal rate and regular rhythm.  Abdomen: Soft; nontender; nondistended; normoactive bowel sounds; no masses or hepatosplenomegaly  Musculoskeletal: No deformities; no active joint inflammation  Extremities: No edema, cyanosis, clubbing  Vessels: Symmetric bilaterally  Neurologic: Alert and oriented; speech intact; face symmetrical; moves all extremities well; CNII-XII intact without focal deficit  Assessment:  1. Leukocytosis, unspecified type   2. Elevated glucose   3. Need for hepatitis C screening test   4. Need for prophylactic vaccination against Streptococcus pneumoniae (pneumococcus)   5. Numbness and tingling   6. Vitamin D deficiency   7. Primary hypertension     Plan:  Update CBC today; Check Hgba1c today; Check Hep C screening today; Prevnar 20 given today; Check B12 level today; Check Vitamin D level; Refill updated; encouraged to take medication as prescribed;  This visit occurred during the SARS-CoV-2 public health emergency.  Safety protocols were in place, including screening questions prior to the visit, additional usage of staff PPE, and extensive cleaning of exam room while observing appropriate contact time as indicated for disinfecting solutions.    No follow-ups on file.  Orders Placed This Encounter  Procedures   Pneumococcal conjugate vaccine 20-valent (Prevnar 20)   CBC with Differential/Platelet   Comp Met (CMET)   Hemoglobin A1c   Hepatitis C Antibody   B12    Vitamin D (25 hydroxy)   HM COLONOSCOPY    This external order was created through the Results Console.    Requested Prescriptions   Signed Prescriptions Disp Refills   valsartan-hydrochlorothiazide (DIOVAN HCT) 160-25 MG tablet 90 tablet 1    Sig: Take 1 tablet by mouth daily.

## 2021-12-13 LAB — CBC WITH DIFFERENTIAL/PLATELET
Basophils Absolute: 0.2 10*3/uL — ABNORMAL HIGH (ref 0.0–0.1)
Basophils Relative: 1.3 % (ref 0.0–3.0)
Eosinophils Absolute: 0.2 10*3/uL (ref 0.0–0.7)
Eosinophils Relative: 1.5 % (ref 0.0–5.0)
HCT: 39.2 % (ref 36.0–46.0)
Hemoglobin: 12.7 g/dL (ref 12.0–15.0)
Lymphocytes Relative: 36 % (ref 12.0–46.0)
Lymphs Abs: 4.3 10*3/uL — ABNORMAL HIGH (ref 0.7–4.0)
MCHC: 32.4 g/dL (ref 30.0–36.0)
MCV: 89.5 fl (ref 78.0–100.0)
Monocytes Absolute: 0.9 10*3/uL (ref 0.1–1.0)
Monocytes Relative: 7.2 % (ref 3.0–12.0)
Neutro Abs: 6.4 10*3/uL (ref 1.4–7.7)
Neutrophils Relative %: 54 % (ref 43.0–77.0)
Platelets: 392 10*3/uL (ref 150.0–400.0)
RBC: 4.38 Mil/uL (ref 3.87–5.11)
RDW: 13 % (ref 11.5–15.5)
WBC: 11.9 10*3/uL — ABNORMAL HIGH (ref 4.0–10.5)

## 2021-12-13 LAB — COMPREHENSIVE METABOLIC PANEL
ALT: 20 U/L (ref 0–35)
AST: 11 U/L (ref 0–37)
Albumin: 3.9 g/dL (ref 3.5–5.2)
Alkaline Phosphatase: 69 U/L (ref 39–117)
BUN: 17 mg/dL (ref 6–23)
CO2: 26 mEq/L (ref 19–32)
Calcium: 9 mg/dL (ref 8.4–10.5)
Chloride: 105 mEq/L (ref 96–112)
Creatinine, Ser: 0.92 mg/dL (ref 0.40–1.20)
GFR: 71.56 mL/min (ref >60.00)
Glucose, Bld: 74 mg/dL (ref 70–99)
Potassium: 4 mEq/L (ref 3.5–5.1)
Sodium: 139 mEq/L (ref 135–145)
Total Bilirubin: 0.3 mg/dL (ref 0.2–1.2)
Total Protein: 6.7 g/dL (ref 6.0–8.3)

## 2021-12-13 LAB — VITAMIN B12: Vitamin B-12: 360 pg/mL (ref 211–911)

## 2021-12-13 LAB — HEPATITIS C ANTIBODY
Hepatitis C Ab: NONREACTIVE
SIGNAL TO CUT-OFF: <0.02 (ref <1.00)

## 2021-12-13 LAB — VITAMIN D 25 HYDROXY (VIT D DEFICIENCY, FRACTURES): VITD: 23.25 ng/mL — ABNORMAL LOW (ref 30.00–100.00)

## 2021-12-13 LAB — HEMOGLOBIN A1C: Hgb A1c MFr Bld: 6.1 % (ref 4.6–6.5)

## 2021-12-14 ENCOUNTER — Other Ambulatory Visit: Payer: Self-pay | Admitting: Family

## 2021-12-14 MED ORDER — METFORMIN HCL ER 500 MG PO TB24: 500.0000 mg | ORAL_TABLET | Freq: Every day | ORAL | 1 refills | Status: DC

## 2021-12-14 MED ORDER — VITAMIN D (ERGOCALCIFEROL) 1.25 MG (50000 UNIT) PO CAPS: 50000.0000 [IU] | ORAL_CAPSULE | ORAL | 0 refills | Status: AC

## 2022-01-04 ENCOUNTER — Encounter: Payer: Self-pay | Admitting: Family

## 2022-01-04 ENCOUNTER — Telehealth (INDEPENDENT_AMBULATORY_CARE_PROVIDER_SITE_OTHER): Payer: No Typology Code available for payment source | Admitting: Family

## 2022-01-04 VITALS — Ht 65.0 in

## 2022-01-04 DIAGNOSIS — J019 Acute sinusitis, unspecified: Secondary | ICD-10-CM

## 2022-01-04 MED ORDER — AMOXICILLIN-POT CLAVULANATE 875-125 MG PO TABS: 1.0000 | ORAL_TABLET | Freq: Two times a day (BID) | ORAL | 0 refills | Status: AC

## 2022-01-04 NOTE — Progress Notes (Signed)
Amanda Mcintosh is a 51 y.o. female with the following history as recorded in EpicCare:  Patient Active Problem List   Diagnosis Date Noted   OSA on CPAP 06/24/2019   Low back pain 04/30/2019   Dizziness 07/04/2017   Chronic seasonal allergic rhinitis 01/25/2017   Chronic sinusitis 01/25/2017   Lateral epicondylitis 01/13/2016   Amenorrhea 06/28/2015   Situational syncope 05/07/2015   GERD (gastroesophageal reflux disease) 04/21/2015   Routine general medical examination at a health care facility 04/21/2015   Unspecified vitamin D deficiency 12/02/2013   Class 2 severe obesity with serious comorbidity and body mass index (BMI) of 38.0 to 38.9 in adult ([place]) 12/02/2013   Iron deficiency anemia 12/02/2013   Tobacco use disorder 12/02/2013   Migraine without aura, with intractable migraine, so stated, without mention of status migrainosus 03/11/2013   Family history of early CAD 09/08/2012    Current Outpatient Medications  Medication Sig Dispense Refill   amoxicillin-clavulanate (AUGMENTIN) 875-125 MG tablet Take 1 tablet by mouth 2 (two) times daily for 10 days. 20 tablet 0   Ascorbic Acid (VITAMIN C) 1000 MG tablet Take 1,000 mg by mouth daily.     cyclobenzaprine (FLEXERIL) 5 MG tablet Take 1 tablet (5 mg total) by mouth 3 (three) times daily as needed for muscle spasms. 30 tablet 0   metFORMIN (GLUCOPHAGE XR) 500 MG 24 hr tablet Take 1 tablet (500 mg total) by mouth daily with breakfast. 90 tablet 1   Turmeric 500 MG TABS Take 500 mg by mouth daily.     valsartan-hydrochlorothiazide (DIOVAN HCT) 160-25 MG tablet Take 1 tablet by mouth daily. 90 tablet 1   vitamin B-12 (CYANOCOBALAMIN) 100 MCG tablet Take 100 mcg by mouth daily.     Vitamin D, Ergocalciferol, (DRISDOL) 1.25 MG (50000 UNIT) CAPS capsule Take 1 capsule (50,000 Units total) by mouth every 7 (seven) days for 12 doses. 12 capsule 0   Zinc 50 MG CAPS Take by mouth.     cholecalciferol (VITAMIN D3) 25 MCG (1000 UT)  tablet Take 1,000 Units by mouth daily. (Patient not taking: Reported on 01/04/2022)     escitalopram (LEXAPRO) 10 MG tablet TAKE 1 TABLET BY MOUTH EVERY DAY (Patient not taking: Reported on 01/04/2022) 90 tablet 1   Multiple Vitamin (MULTIVITAMIN WITH MINERALS) TABS tablet Take 1 tablet by mouth daily.  (Patient not taking: Reported on 01/04/2022)     vitamin E 1000 UNIT capsule Take 1,000 Units by mouth daily. (Patient not taking: Reported on 12/12/2021)     vitamin k 100 MCG tablet Take 100 mcg by mouth daily. (Patient not taking: Reported on 12/12/2021)     No current facility-administered medications for this visit.    Allergies: Gadolinium derivatives  Past Medical History:  Diagnosis Date   Fibroids    Heartburn    History of headache    Hyperlipidemia    Hypertension    Obesity    Thyroid condition    Vitamin D deficiency     Past Surgical History:  Procedure Laterality Date   CHOLECYSTECTOMY  2006   ROBOT ASSISTED MYOMECTOMY  2011   TONSILLECTOMY     WISDOM TOOTH EXTRACTION      Family History  Problem Relation Age of Onset   Diabetes Mother    Hypertension Mother    Heart failure Mother    Stroke Mother    High Cholesterol Mother    Depression Mother    Anxiety disorder Mother    Sleep  apnea Mother    Obesity Mother    COPD Father        smoker   High blood pressure Father    High Cholesterol Father    Breast cancer Sister    Skin cancer Sister    HIV/AIDS Brother    Hyperlipidemia Other     Social History   Tobacco Use   Smoking status: Some Days    Packs/day: 0.30    Types: Cigarettes    Last attempt to quit: 12/01/2019    Years since quitting: 2.0   Smokeless tobacco: Never   Tobacco comments:    Working on cutting back  Substance Use Topics   Alcohol use: No    Subjective:    I connected with Sharlee Blew on 01/04/22 at  3:40 PM EST by a video enabled telemedicine application and verified that I am speaking with the correct person using  two identifiers.   I discussed the limitations of evaluation and management by telemedicine and the availability of in person appointments. The patient expressed understanding and agreed to proceed. Provider in office/ patient is at home; provider and patient are only 2 people on video call.   1 week history of sinus infection; + facial pressure/ headache; requesting treatment with antibiotic;      Objective:  Vitals:   01/04/22 1545  Height: 5\' 5"  (1.651 m)    General: Well developed, well nourished, in no acute distress  Skin : Warm and dry.  Head: Normocephalic and atraumatic  Lungs: Respirations unlabored;  Neurologic: Alert and oriented; speech intact; face symmetrical;   Assessment:  1. Acute sinusitis, recurrence not specified, unspecified location     Plan:  Rx for Augmentin 875 mg bid x 10 days; increase fluids, rest and follow up worse, no better.     No follow-ups on file.  No orders of the defined types were placed in this encounter.   Requested Prescriptions   Signed Prescriptions Disp Refills   amoxicillin-clavulanate (AUGMENTIN) 875-125 MG tablet 20 tablet 0    Sig: Take 1 tablet by mouth 2 (two) times daily for 10 days.

## 2022-03-02 ENCOUNTER — Other Ambulatory Visit: Payer: Self-pay | Admitting: Family

## 2022-03-02 ENCOUNTER — Ambulatory Visit (INDEPENDENT_AMBULATORY_CARE_PROVIDER_SITE_OTHER): Payer: No Typology Code available for payment source | Admitting: Family

## 2022-03-02 ENCOUNTER — Encounter: Payer: Self-pay | Admitting: Family

## 2022-03-02 VITALS — BP 122/70 | HR 92 | Temp 98.4°F | Ht 65.0 in | Wt 267.0 lb

## 2022-03-02 DIAGNOSIS — E538 Deficiency of other specified B group vitamins: Secondary | ICD-10-CM

## 2022-03-02 DIAGNOSIS — F4321 Adjustment disorder with depressed mood: Secondary | ICD-10-CM

## 2022-03-02 DIAGNOSIS — F419 Anxiety disorder, unspecified: Secondary | ICD-10-CM

## 2022-03-02 DIAGNOSIS — R7303 Prediabetes: Secondary | ICD-10-CM

## 2022-03-02 DIAGNOSIS — J019 Acute sinusitis, unspecified: Secondary | ICD-10-CM

## 2022-03-02 DIAGNOSIS — I10 Essential (primary) hypertension: Secondary | ICD-10-CM

## 2022-03-02 DIAGNOSIS — F32A Depression, unspecified: Secondary | ICD-10-CM

## 2022-03-02 DIAGNOSIS — G5601 Carpal tunnel syndrome, right upper limb: Secondary | ICD-10-CM

## 2022-03-02 LAB — CBC WITH DIFFERENTIAL/PLATELET
Basophils Absolute: 0.1 10*3/uL (ref 0.0–0.1)
Basophils Relative: 0.9 % (ref 0.0–3.0)
Eosinophils Absolute: 0.2 10*3/uL (ref 0.0–0.7)
Eosinophils Relative: 1.4 % (ref 0.0–5.0)
HCT: 39 % (ref 36.0–46.0)
Hemoglobin: 12.9 g/dL (ref 12.0–15.0)
Lymphocytes Relative: 27.3 % (ref 12.0–46.0)
Lymphs Abs: 3 10*3/uL (ref 0.7–4.0)
MCHC: 33 g/dL (ref 30.0–36.0)
MCV: 88.9 fl (ref 78.0–100.0)
Monocytes Absolute: 0.6 10*3/uL (ref 0.1–1.0)
Monocytes Relative: 5.6 % (ref 3.0–12.0)
Neutro Abs: 7.2 10*3/uL (ref 1.4–7.7)
Neutrophils Relative %: 64.8 % (ref 43.0–77.0)
Platelets: 359 10*3/uL (ref 150.0–400.0)
RBC: 4.39 Mil/uL (ref 3.87–5.11)
RDW: 13.3 % (ref 11.5–15.5)
WBC: 11.1 10*3/uL — ABNORMAL HIGH (ref 4.0–10.5)

## 2022-03-02 LAB — COMPREHENSIVE METABOLIC PANEL
ALT: 19 U/L (ref 0–35)
AST: 12 U/L (ref 0–37)
Albumin: 3.8 g/dL (ref 3.5–5.2)
Alkaline Phosphatase: 68 U/L (ref 39–117)
BUN: 14 mg/dL (ref 6–23)
CO2: 27 mEq/L (ref 19–32)
Calcium: 8.7 mg/dL (ref 8.4–10.5)
Chloride: 104 mEq/L (ref 96–112)
Creatinine, Ser: 0.84 mg/dL (ref 0.40–1.20)
GFR: 79.69 mL/min (ref >60.00)
Glucose, Bld: 120 mg/dL — ABNORMAL HIGH (ref 70–99)
Potassium: 3.7 mEq/L (ref 3.5–5.1)
Sodium: 138 mEq/L (ref 135–145)
Total Bilirubin: 0.5 mg/dL (ref 0.2–1.2)
Total Protein: 6.3 g/dL (ref 6.0–8.3)

## 2022-03-02 LAB — VITAMIN B12: Vitamin B-12: 846 pg/mL (ref 211–911)

## 2022-03-02 LAB — HEMOGLOBIN A1C: Hgb A1c MFr Bld: 6.1 % (ref 4.6–6.5)

## 2022-03-02 MED ORDER — AMOXICILLIN-POT CLAVULANATE 875-125 MG PO TABS: 1.0000 | ORAL_TABLET | Freq: Two times a day (BID) | ORAL | 0 refills | Status: DC

## 2022-03-02 NOTE — Progress Notes (Signed)
?Amanda Mcintosh is a 51 y.o. female with the following history as recorded in EpicCare:  ?Patient Active Problem List  ? Diagnosis Date Noted  ? OSA on CPAP 06/24/2019  ? Low back pain 04/30/2019  ? Dizziness 07/04/2017  ? Chronic seasonal allergic rhinitis 01/25/2017  ? Chronic sinusitis 01/25/2017  ? Lateral epicondylitis 01/13/2016  ? Amenorrhea 06/28/2015  ? Situational syncope 05/07/2015  ? GERD (gastroesophageal reflux disease) 04/21/2015  ? Routine general medical examination at a health care facility 04/21/2015  ? Unspecified vitamin D deficiency 12/02/2013  ? Class 2 severe obesity with serious comorbidity and body mass index (BMI) of 38.0 to 38.9 in adult [facility]) 12/02/2013  ? Iron deficiency anemia 12/02/2013  ? Tobacco use disorder 12/02/2013  ? Migraine without aura, with intractable migraine, so stated, without mention of status migrainosus 03/11/2013  ? Family history of early CAD 09/08/2012  ?  ?Current Outpatient Medications  ?Medication Sig Dispense Refill  ? amoxicillin-clavulanate (AUGMENTIN) 875-125 MG tablet Take 1 tablet by mouth 2 (two) times daily for 10 days. 20 tablet 0  ? Ascorbic Acid (VITAMIN C) 1000 MG tablet Take 1,000 mg by mouth daily.    ? cyclobenzaprine (FLEXERIL) 5 MG tablet Take 1 tablet (5 mg total) by mouth 3 (three) times daily as needed for muscle spasms. 30 tablet 0  ? metFORMIN (GLUCOPHAGE XR) 500 MG 24 hr tablet Take 1 tablet (500 mg total) by mouth daily with breakfast. 90 tablet 1  ? valsartan-hydrochlorothiazide (DIOVAN HCT) 160-25 MG tablet Take 1 tablet by mouth daily. 90 tablet 1  ? vitamin B-12 (CYANOCOBALAMIN) 100 MCG tablet Take 100 mcg by mouth daily.    ? Vitamin D, Ergocalciferol, (DRISDOL) 1.25 MG (50000 UNIT) CAPS capsule Take 1 capsule (50,000 Units total) by mouth every 7 (seven) days for 12 doses. 12 capsule 0  ? Zinc 50 MG CAPS Take by mouth.    ? cholecalciferol (VITAMIN D3) 25 MCG (1000 UT) tablet Take 1,000 Units by mouth daily. (Patient not  taking: Reported on 01/04/2022)    ? escitalopram (LEXAPRO) 10 MG tablet TAKE 1 TABLET BY MOUTH EVERY DAY (Patient not taking: Reported on 01/04/2022) 90 tablet 1  ? Multiple Vitamin (MULTIVITAMIN WITH MINERALS) TABS tablet Take 1 tablet by mouth daily.  (Patient not taking: Reported on 01/04/2022)    ? Turmeric 500 MG TABS Take 500 mg by mouth daily. (Patient not taking: Reported on 03/02/2022)    ? vitamin E 1000 UNIT capsule Take 1,000 Units by mouth daily. (Patient not taking: Reported on 12/12/2021)    ? vitamin k 100 MCG tablet Take 100 mcg by mouth daily. (Patient not taking: Reported on 12/12/2021)    ? ?No current facility-administered medications for this visit.  ?  ?Allergies: Gadolinium derivatives  ?Past Medical History:  ?Diagnosis Date  ? Fibroids   ? Heartburn   ? History of headache   ? Hyperlipidemia   ? Hypertension   ? Obesity   ? Thyroid condition   ? Vitamin D deficiency   ?  ?Past Surgical History:  ?Procedure Laterality Date  ? CHOLECYSTECTOMY  2006  ? ROBOT ASSISTED MYOMECTOMY  2011  ? TONSILLECTOMY    ? WISDOM TOOTH EXTRACTION    ?  ?Family History  ?Problem Relation Age of Onset  ? Diabetes Mother   ? Hypertension Mother   ? Heart failure Mother   ? Stroke Mother   ? High Cholesterol Mother   ? Depression Mother   ? Anxiety  disorder Mother   ? Sleep apnea Mother   ? Obesity Mother   ? COPD Father   ?     smoker  ? High blood pressure Father   ? High Cholesterol Father   ? Breast cancer Sister   ? Skin cancer Sister   ? HIV/AIDS Brother   ? Hyperlipidemia Other   ?  ?Social History  ? ?Tobacco Use  ? Smoking status: Some Days  ?  Packs/day: 0.30  ?  Types: Cigarettes  ?  Last attempt to quit: 12/01/2019  ?  Years since quitting: 2.2  ? Smokeless tobacco: Never  ? Tobacco comments:  ?  Working on cutting back  ?Substance Use Topics  ? Alcohol use: No  ?  ?Subjective:  ? ?Patient presents with concerns for possible sinus infection; prone to recurrent sinus infections; feels like symptoms started in  the past 2 days; has been under increased stress lately- just lost her brother, aunt and uncle; recently traveled by train; not taking any allergy medication at this time;  ? ?Does mention increased problems with numbness/ tingling in right arm; work does involve extended time on laptop  ? ? ? ?Objective:  ?Vitals:  ? 03/02/22 0950  ?BP: 122/70  ?Pulse: 92  ?Temp: 98.4 ?F (36.9 ?C)  ?TempSrc: Oral  ?SpO2: 96%  ?Weight: 267 lb (121.1 kg)  ?Height: 5' 5" (1.651 m)  ?  ?General: Well developed, well nourished, in no acute distress  ?Skin : Warm and dry.  ?Head: Normocephalic and atraumatic  ?Eyes: Sclera and conjunctiva clear; pupils round and reactive to light; extraocular movements intact  ?Ears: External normal; canals clear; tympanic membranes normal  ?Oropharynx: Pink, supple. No suspicious lesions  ?Neck: Supple without thyromegaly, adenopathy  ?Lungs: Respirations unlabored; clear to auscultation bilaterally without wheeze, rales, rhonchi  ?CVS exam: normal rate and regular rhythm.  ?Musculoskeletal: No deformities; no active joint inflammation  ?Extremities: No edema, cyanosis, clubbing  ?Vessels: Symmetric bilaterally  ?Neurologic: Alert and oriented; speech intact; face symmetrical; moves all extremities well; CNII-XII intact without focal deficit  ? ?Assessment:  ?1. Pre-diabetes   ?2. Low vitamin B12 level   ?3. Anxiety and depression   ?4. Grief reaction   ?5. Carpal tunnel syndrome of right wrist   ?6. Acute sinusitis, recurrence not specified, unspecified location   ?7. Primary hypertension   ?  ?Plan:  ?Check Hgba1c today; ?Check B12 level today; ?3. & 4. Referral to behavioral health; patient is also encouraged to reach out to her EAP provider;  ?5.  Refer to orthopedics; ?6.  Rx for Augmentin 875 mg bid x 10 days; ?7. Stable; refill updated;  ? ?This visit occurred during the SARS-CoV-2 public health emergency.  Safety protocols were in place, including screening questions prior to the visit,  additional usage of staff PPE, and extensive cleaning of exam room while observing appropriate contact time as indicated for disinfecting solutions.  ? ? ?No follow-ups on file.  ?Orders Placed This Encounter  ?Procedures  ? Comp Met (CMET)  ? Hemoglobin A1c  ? B12  ? CBC with Differential/Platelet  ? Ambulatory referral to Psychology  ?  Referral Priority:   Routine  ?  Referral Type:   Psychiatric  ?  Referral Reason:   Specialty Services Required  ?  Requested Specialty:   Psychology  ?  Number of Visits Requested:   1  ? Ambulatory referral to Orthopedic Surgery  ?  Referral Priority:   Routine  ?  Referral Type:   Surgical  ?  Referral Reason:   Specialty Services Required  ?  Requested Specialty:   Orthopedic Surgery  ?  Number of Visits Requested:   1  ?  ?Requested Prescriptions  ? ?Signed Prescriptions Disp Refills  ? amoxicillin-clavulanate (AUGMENTIN) 875-125 MG tablet 20 tablet 0  ?  Sig: Take 1 tablet by mouth 2 (two) times daily for 10 days.  ?  ? ?

## 2022-03-06 ENCOUNTER — Ambulatory Visit (INDEPENDENT_AMBULATORY_CARE_PROVIDER_SITE_OTHER): Payer: No Typology Code available for payment source | Admitting: Family

## 2022-03-06 ENCOUNTER — Telehealth: Payer: Self-pay

## 2022-03-06 ENCOUNTER — Other Ambulatory Visit: Payer: Self-pay | Admitting: Family

## 2022-03-06 VITALS — BP 108/60 | HR 105 | Temp 100.0°F | Ht 65.0 in | Wt 262.8 lb

## 2022-03-06 DIAGNOSIS — J019 Acute sinusitis, unspecified: Secondary | ICD-10-CM

## 2022-03-06 DIAGNOSIS — J209 Acute bronchitis, unspecified: Secondary | ICD-10-CM | POA: Diagnosis not present

## 2022-03-06 MED ORDER — DOXYCYCLINE HYCLATE 100 MG PO TABS: 100.0000 mg | ORAL_TABLET | Freq: Two times a day (BID) | ORAL | 0 refills | Status: DC

## 2022-03-06 MED ORDER — HYDROCODONE BIT-HOMATROP MBR 5-1.5 MG/5ML PO SOLN: 5.0000 mL | Freq: Three times a day (TID) | ORAL | 0 refills | Status: DC | PRN

## 2022-03-06 MED ORDER — PREDNISONE 20 MG PO TABS: ORAL_TABLET | ORAL | 0 refills | Status: DC

## 2022-03-06 NOTE — Telephone Encounter (Signed)
Nurse Assessment ?Nurse: Raphael Gibney, RN, Vanita Ingles Date/Time Eilene Ghazi Time): 03/06/2022 8:40:47 AM ?Confirm and document reason for call. If ?symptomatic, describe symptoms. ?---Caller states she was seen on Friday and given ?antibiotics for sinus infection. She has gotten worse. ?She is coughing constantly and has a lot of sinus ?pressure and pain. Cough is keeping her awake. Temp ?98.8. ?Does the patient have any new or worsening ?symptoms? ---Yes ?Will a triage be completed? ---Yes ?Related visit to physician within the last 2 weeks? ---Yes ?Does the PT have any chronic conditions? (i.e. ?diabetes, asthma, this includes High risk factors for ?pregnancy, etc.) ?---Yes ?List chronic conditions. ---HTN ?Is the patient pregnant or possibly pregnant? (Ask ?all females between the ages of 50-55) ---No ?Is this a behavioral health or substance abuse call? ---No ?Guidelines ?Guideline Title Affirmed Question Affirmed Notes Nurse Date/Time (Eastern ?Time) ?Sinus Infection on ?Antibiotic Follow-up ?Call ?[1] SEVERE sinus ?pain AND [2] not ?improved 2 hours ?after pain medicine ?Raphael Gibney, RN, Vanita Ingles 03/06/2022 8:43:32 AM ?PLEASE NOTE: All timestamps contained within this report are represented as [place] Standard Time. ?CONFIDENTIALTY NOTICE: This fax transmission is intended only for the addressee. It contains information that is legally privileged, confidential or ?otherwise protected from use or disclosure. If you are not the intended recipient, you are strictly prohibited from reviewing, disclosing, copying using ?or disseminating any of this information or taking any action in reliance on or regarding this information. If you have received this fax in error, please ?notify us immediately by telephone so that we can arrange for its return to [place]. Phone: [phone], Toll-Free: [phone], Fax: [phone] ?Page: 2 of 2 ?Call Id: [id number] ?Disp. Time (Eastern ?Time) Disposition Final User ?03/06/2022 8:48:07 AM See HCP within 4  Hours (or ?PCP triage) ?Yes Raphael Gibney, RN, Vanita Ingles ?Caller Disagree/Comply Comply ?Caller Understands Yes ?PreDisposition Call Doctor ?Care Advice Given Per Guideline ?SEE HCP (OR PCP TRIAGE) WITHIN 4 HOURS: * IF OFFICE WILL BE OPEN: You need to be seen within the next 3 or 4 ?hours. Call your doctor (or NP/PA) now or as soon as the office opens. CALL BACK IF: * You become worse PAIN MEDICINES: ?* For pain relief, you can take either acetaminophen, ibuprofen, or naproxen. CARE ADVICE given per Sinus Infection on Antibiotic ?Follow-Up Call (Adult) guideline. ?Comments ?User: Dannielle Burn, RN Date/Time Eilene Ghazi Time): 03/06/2022 8:50:21 AM ?Called office and gave report that pt was seen on Friday for a sinus infection. has been taking antibiotics and is ?worse. triage outcome see physician within 4 hrs. Warm transferred pt to the office ?Referrals ?REFERRED TO PCP OFFICE ? ? ?Appt with PCP today.  ?

## 2022-03-06 NOTE — Telephone Encounter (Signed)
Pt has OV today with Jodi Mourning.  ?

## 2022-03-06 NOTE — Progress Notes (Signed)
?Amanda Mcintosh is a 51 y.o. female with the following history as recorded in [employer]:  ?Patient Active Problem List  ? Diagnosis Date Noted  ? OSA on CPAP 06/24/2019  ? Low back pain 04/30/2019  ? Dizziness 07/04/2017  ? Chronic seasonal allergic rhinitis 01/25/2017  ? Chronic sinusitis 01/25/2017  ? Lateral epicondylitis 01/13/2016  ? Amenorrhea 06/28/2015  ? Situational syncope 05/07/2015  ? GERD (gastroesophageal reflux disease) 04/21/2015  ? Routine general medical examination at a health care facility 04/21/2015  ? Unspecified vitamin D deficiency 12/02/2013  ? Class 2 severe obesity with serious comorbidity and body mass index (BMI) of 38.0 to 38.9 in adult [facility]) 12/02/2013  ? Iron deficiency anemia 12/02/2013  ? Tobacco use disorder 12/02/2013  ? Migraine without aura, with intractable migraine, so stated, without mention of status migrainosus 03/11/2013  ? Family history of early CAD 09/08/2012  ?  ?Current Outpatient Medications  ?Medication Sig Dispense Refill  ? cholecalciferol (VITAMIN D3) 25 MCG (1000 UT) tablet Take 1,000 Units by mouth daily.    ? cyclobenzaprine (FLEXERIL) 5 MG tablet Take 1 tablet (5 mg total) by mouth 3 (three) times daily as needed for muscle spasms. 30 tablet 0  ? doxycycline (VIBRA-TABS) 100 MG tablet Take 1 tablet (100 mg total) by mouth 2 (two) times daily. 14 tablet 0  ? HYDROcodone bit-homatropine (HYCODAN) 5-1.5 MG/5ML syrup Take 5 mLs by mouth every 8 (eight) hours as needed for cough. 120 mL 0  ? metFORMIN (GLUCOPHAGE XR) 500 MG 24 hr tablet Take 1 tablet (500 mg total) by mouth daily with breakfast. 90 tablet 1  ? Multiple Vitamin (MULTIVITAMIN WITH MINERALS) TABS tablet Take 1 tablet by mouth daily.    ? predniSONE (DELTASONE) 20 MG tablet Take 2 tablets daily x 2 days, then decrease to one per day x 5 days 9 tablet 0  ? valsartan-hydrochlorothiazide (DIOVAN HCT) 160-25 MG tablet Take 1 tablet by mouth daily. 90 tablet 1  ? Ascorbic Acid (VITAMIN C) 1000 MG  tablet Take 1,000 mg by mouth daily. (Patient not taking: Reported on 03/06/2022)    ? escitalopram (LEXAPRO) 10 MG tablet TAKE 1 TABLET BY MOUTH EVERY DAY (Patient not taking: Reported on 03/06/2022) 90 tablet 1  ? Turmeric 500 MG TABS Take 500 mg by mouth daily. (Patient not taking: Reported on 03/06/2022)    ? vitamin B-12 (CYANOCOBALAMIN) 100 MCG tablet Take 100 mcg by mouth daily. (Patient not taking: Reported on 03/06/2022)    ? vitamin E 1000 UNIT capsule Take 1,000 Units by mouth daily. (Patient not taking: Reported on 03/06/2022)    ? vitamin k 100 MCG tablet Take 100 mcg by mouth daily. (Patient not taking: Reported on 03/06/2022)    ? Zinc 50 MG CAPS Take by mouth. (Patient not taking: Reported on 03/06/2022)    ? ?No current facility-administered medications for this visit.  ?  ?Allergies: Gadolinium derivatives  ?Past Medical History:  ?Diagnosis Date  ? Fibroids   ? Heartburn   ? History of headache   ? Hyperlipidemia   ? Hypertension   ? Obesity   ? Thyroid condition   ? Vitamin D deficiency   ?  ?Past Surgical History:  ?Procedure Laterality Date  ? CHOLECYSTECTOMY  2006  ? ROBOT ASSISTED MYOMECTOMY  2011  ? TONSILLECTOMY    ? WISDOM TOOTH EXTRACTION    ?  ?Family History  ?Problem Relation Age of Onset  ? Diabetes Mother   ? Hypertension Mother   ?  Heart failure Mother   ? Stroke Mother   ? High Cholesterol Mother   ? Depression Mother   ? Anxiety disorder Mother   ? Sleep apnea Mother   ? Obesity Mother   ? COPD Father   ?     smoker  ? High blood pressure Father   ? High Cholesterol Father   ? Breast cancer Sister   ? Skin cancer Sister   ? HIV/AIDS Brother   ? Hyperlipidemia Other   ?  ?Social History  ? ?Tobacco Use  ? Smoking status: Some Days  ?  Packs/day: 0.30  ?  Types: Cigarettes  ?  Last attempt to quit: 12/01/2019  ?  Years since quitting: 2.2  ? Smokeless tobacco: Never  ? Tobacco comments:  ?  Working on cutting back  ?Substance Use Topics  ? Alcohol use: No  ?  ?Subjective:  ? ?Patient was seen last  Friday and diagnosed with acute sinusitis; is on day 5 of Augmentin but notes that cough/ congestion are worse; no OTC cough medications;  ? ? ? ?Objective:  ?Vitals:  ? 03/06/22 1406  ?BP: 108/60  ?Pulse: (!) 105  ?Temp: 100 ?F (37.8 ?C)  ?TempSrc: Oral  ?SpO2: 96%  ?Weight: 262 lb 12.8 oz (119.2 kg)  ?Height: '5\' 5"'$  (1.651 m)  ?  ?General: Well developed, well nourished, in no acute distress  ?Skin : Warm and dry.  ?Head: Normocephalic and atraumatic  ?Eyes: Sclera and conjunctiva clear; pupils round and reactive to light; extraocular movements intact  ?Ears: External normal; canals clear; tympanic membranes normal  ?Oropharynx: Pink, supple. No suspicious lesions  ?Neck: Supple without thyromegaly, adenopathy  ?Lungs: Respirations unlabored; clear to auscultation bilaterally without wheeze, rales, rhonchi  ?Neurologic: Alert and oriented; speech intact; face symmetrical; moves all extremities well; CNII-XII intact without focal deficit  ? ?Assessment:  ?1. Acute sinusitis, recurrence not specified, unspecified location   ?2. Acute bronchitis, unspecified organism   ?  ?Plan:  ?D/C Augmentin- change to Doxycycline; Rx for Prednisone and Hycodan; increase fluids, rest and follow up worse, no better; ?Work note given for the remainder of the week;  ? ?This visit occurred during the SARS-CoV-2 public health emergency.  Safety protocols were in place, including screening questions prior to the visit, additional usage of staff PPE, and extensive cleaning of exam room while observing appropriate contact time as indicated for disinfecting solutions.  ? ? ?No follow-ups on file.  ?No orders of the defined types were placed in this encounter. ?  ?Requested Prescriptions  ? ?Signed Prescriptions Disp Refills  ? doxycycline (VIBRA-TABS) 100 MG tablet 14 tablet 0  ?  Sig: Take 1 tablet (100 mg total) by mouth 2 (two) times daily.  ? predniSONE (DELTASONE) 20 MG tablet 9 tablet 0  ?  Sig: Take 2 tablets daily x 2 days, then  decrease to one per day x 5 days  ? HYDROcodone bit-homatropine (HYCODAN) 5-1.5 MG/5ML syrup 120 mL 0  ?  Sig: Take 5 mLs by mouth every 8 (eight) hours as needed for cough.  ?  ? ?

## 2022-03-06 NOTE — Telephone Encounter (Signed)
Below are the pts sxs- they have worsened and cough is keeping her up.  ? ?Tried calling, no answer and vm box is full.  ?

## 2022-03-09 ENCOUNTER — Ambulatory Visit: Payer: No Typology Code available for payment source | Admitting: Orthopedic Surgery

## 2022-03-14 ENCOUNTER — Telehealth: Payer: Self-pay | Admitting: Family

## 2022-03-14 NOTE — Telephone Encounter (Signed)
Pt stated she has been sick for weeks and nothing is helping. Advised pt she could possibly schedule an appointment to talk ab some next options. She was unsure if she could wait till tomorrow or if she should go to an ED. Transferred pt to triage so she could discuss this with a nurse.  ?

## 2022-03-15 ENCOUNTER — Ambulatory Visit (INDEPENDENT_AMBULATORY_CARE_PROVIDER_SITE_OTHER): Payer: No Typology Code available for payment source

## 2022-03-15 ENCOUNTER — Other Ambulatory Visit: Payer: Self-pay

## 2022-03-15 ENCOUNTER — Ambulatory Visit
Admission: EM | Admit: 2022-03-15 | Discharge: 2022-03-15 | Disposition: A | Discharge status: Home / Self Care | Payer: No Typology Code available for payment source | Attending: Internal Medicine | Admitting: Internal Medicine

## 2022-03-15 DIAGNOSIS — R053 Chronic cough: Secondary | ICD-10-CM | POA: Diagnosis not present

## 2022-03-15 DIAGNOSIS — J069 Acute upper respiratory infection, unspecified: Secondary | ICD-10-CM | POA: Diagnosis not present

## 2022-03-15 DIAGNOSIS — R0602 Shortness of breath: Secondary | ICD-10-CM | POA: Diagnosis not present

## 2022-03-15 DIAGNOSIS — R059 Cough, unspecified: Secondary | ICD-10-CM

## 2022-03-15 MED ORDER — ALBUTEROL SULFATE HFA 108 (90 BASE) MCG/ACT IN AERS: 1.0000 | INHALATION_SPRAY | Freq: Four times a day (QID) | RESPIRATORY_TRACT | 0 refills | Status: DC | PRN

## 2022-03-15 MED ORDER — BENZONATATE 100 MG PO CAPS: 100.0000 mg | ORAL_CAPSULE | Freq: Three times a day (TID) | ORAL | 0 refills | Status: DC | PRN

## 2022-03-15 MED ORDER — PREDNISONE 10 MG (21) PO TBPK: ORAL_TABLET | Freq: Every day | ORAL | 0 refills | Status: DC

## 2022-03-15 NOTE — Telephone Encounter (Signed)
Pt was transferred to triage and the notes are below.  ? ?Triage / Clinical ?Relationship To Patient Self ?Return Phone Number [phone] (Primary) ?Chief Complaint Cough ?Reason for Call Symptomatic / Request for Health Information ?Initial Comment Caller states she has a sinus infection, finished ?antibiotics and is still coughing up green mucus ?and isnt getting better even after office visit. ?Translation No ?Nurse Assessment ?Nurse: Noralee Stain, RN, [place] Date/Time (Eastern Time): 03/14/2022 3:20:03 PM ?Confirm and document reason for call. If ?symptomatic, describe symptoms. ?---Caller states she has a sinus infection, finished ?antibiotics and is still coughing up green mucus and ?isn't getting better even after office visit on March ?3, 23 and March 7, 23. Finished the medications ?yesterday. Pt states her cough is not any better and ?is keeping her up at night. Pt is coughing up mucus. ?Denies any other symptoms at this time. ?Guideline Title Affirmed Question Affirmed Notes Nurse Date/Time (Eastern ?Time) ?Cough - Acute ?Productive ?[1] MODERATE ?difficulty breathing ?(e.g., speaks in ?phrases, SOB even at ?rest, pulse 100-120) ?Noralee Stain, RN, [place] 03/14/2022 3:23:20 ?PM ?PLEASE NOTE: All timestamps contained within this report are represented as [place] Standard Time. ?CONFIDENTIALTY NOTICE: This fax transmission is intended only for the addressee. It contains information that is legally privileged, confidential or ?otherwise protected from use or disclosure. If you are not the intended recipient, you are strictly prohibited from reviewing, disclosing, copying using ?or disseminating any of this information or taking any action in reliance on or regarding this information. If you have received this fax in error, please ?notify us immediately by telephone so that we can arrange for its return to [place]. Phone: [phone], Toll-Free: [phone], Fax: [phone] ?Page: 2 of 2 ?Call Id:  [id number] ?Guidelines ?Guideline Title Affirmed Question Affirmed Notes Nurse Date/Time (Eastern ?Time) ?AND [2] still present ?when not coughing ?Disp. Time (Eastern ?Time) Disposition Final User ?03/14/2022 3:10:04 PM Attempt made - message left Noralee Stain, RN, Margreta Journey ?03/14/2022 3:27:14 PM Go to ED Now Yes Noralee Stain, RN, Margreta Journey ?

## 2022-03-15 NOTE — Telephone Encounter (Signed)
Still coughing up mucus and finished antibiotic on Tuesday. Still has green flam. Did not got to the ER yesterday, took a mucinex and she is planning on going to Urgent care now to get an x-ray to ensure that she doesn't have pneumonia. Soreness due to the cough. I have informed the provider as a FYI  ?

## 2022-03-15 NOTE — Discharge Instructions (Signed)
Chest x-ray was normal.  You have been prescribed 3 medications to help alleviate symptoms.  Please follow-up if symptoms persist or worsen. ?

## 2022-03-15 NOTE — ED Provider Notes (Addendum)
?[facility] ? ? ? ?[name]: [id number] ?Arrival date & time: 03/15/22  1700 ? ? ?  ? ?History   ?Chief Complaint ?Chief Complaint  ?Patient presents with  ? Cough  ? Nasal Congestion  ? ? ?HPI ?Amanda Mcintosh is a 51 y.o. female.  ? ?Patient presents with persistent cough and nasal congestion that has been present for approximately 16 days.  Patient has been seen twice by PCP.  The first visit she was prescribed Augmentin antibiotic.  She returned approximately 3 to 4 days later and Augmentin was discontinued and patient was started on doxycycline, prednisone steroid, cough medication with no improvement in symptoms.  She has completed those medications. She endorses some new onset shortness of breath that is intermittent.  Denies chest pain, sore throat, nausea, vomiting, diarrhea, abdominal pain.  Denies any known fevers at home.  Denies history of asthma or COPD.  Patient has a history of smoking. ? ? ?Cough ? ?Past Medical History:  ?Diagnosis Date  ? Fibroids   ? Heartburn   ? History of headache   ? Hyperlipidemia   ? Hypertension   ? Obesity   ? Thyroid condition   ? Vitamin D deficiency   ? ? ?Patient Active Problem List  ? Diagnosis Date Noted  ? OSA on CPAP 06/24/2019  ? Low back pain 04/30/2019  ? Dizziness 07/04/2017  ? Chronic seasonal allergic rhinitis 01/25/2017  ? Chronic sinusitis 01/25/2017  ? Lateral epicondylitis 01/13/2016  ? Amenorrhea 06/28/2015  ? Situational syncope 05/07/2015  ? GERD (gastroesophageal reflux disease) 04/21/2015  ? Routine general medical examination at a health care facility 04/21/2015  ? Unspecified vitamin D deficiency 12/02/2013  ? Class 2 severe obesity with serious comorbidity and body mass index (BMI) of 38.0 to 38.9 in adult [facility]) 12/02/2013  ? Iron deficiency anemia 12/02/2013  ? Tobacco use disorder 12/02/2013  ? Migraine without aura, with intractable migraine, so stated, without mention of status migrainosus 03/11/2013  ? Family history of early CAD  09/08/2012  ? ? ?Past Surgical History:  ?Procedure Laterality Date  ? CHOLECYSTECTOMY  2006  ? ROBOT ASSISTED MYOMECTOMY  2011  ? TONSILLECTOMY    ? WISDOM TOOTH EXTRACTION    ? ? ?OB History   ? ? Gravida  ?1  ? Para  ?1  ? Term  ?   ? Preterm  ?   ? AB  ?   ? Living  ?   ?  ? ? SAB  ?   ? IAB  ?   ? Ectopic  ?   ? Multiple  ?   ? Live Births  ?   ?   ?  ?  ? ? ? ?Home Medications   ? ?Prior to Admission medications   ?Medication Sig Start Date End Date Taking? Authorizing Provider  ?albuterol (VENTOLIN HFA) 108 (90 Base) MCG/ACT inhaler Inhale 1-2 puffs into the lungs every 6 (six) hours as needed for wheezing or shortness of breath. 03/15/22  Yes Jone Panebianco, Michele Rockers, FNP  ?benzonatate (TESSALON) 100 MG capsule Take 1 capsule (100 mg total) by mouth every 8 (eight) hours as needed for cough. 03/15/22  Yes Teodora Medici, FNP  ?predniSONE (STERAPRED UNI-PAK 21 TAB) 10 MG (21) TBPK tablet Take by mouth daily. Take 6 tabs by mouth daily  for 2 days, then 5 tabs for 2 days, then 4 tabs for 2 days, then 3 tabs for 2 days, 2 tabs for 2 days, then  1 tab by mouth daily for 2 days 03/15/22  Yes Teodora Medici, FNP  ?Ascorbic Acid (VITAMIN C) 1000 MG tablet Take 1,000 mg by mouth daily. ?Patient not taking: Reported on 03/06/2022    [provider]  ?cholecalciferol (VITAMIN D3) 25 MCG (1000 UT) tablet Take 1,000 Units by mouth daily.    [provider]  ?cyclobenzaprine (FLEXERIL) 5 MG tablet Take 1 tablet (5 mg total) by mouth 3 (three) times daily as needed for muscle spasms. 04/17/21   Marrian Salvage, FNP  ?doxycycline (VIBRA-TABS) 100 MG tablet Take 1 tablet (100 mg total) by mouth 2 (two) times daily. 03/06/22   Marrian Salvage, [place]  ?escitalopram (LEXAPRO) 10 MG tablet TAKE 1 TABLET BY MOUTH EVERY DAY ?Patient not taking: Reported on 03/06/2022 11/27/21   Marrian Salvage, [place]  ?HYDROcodone bit-homatropine (HYCODAN) 5-1.5 MG/5ML syrup Take 5 mLs by mouth every 8 (eight) hours as needed for  cough. 03/06/22   Marrian Salvage, [place]  ?metFORMIN (GLUCOPHAGE XR) 500 MG 24 hr tablet Take 1 tablet (500 mg total) by mouth daily with breakfast. 12/14/21   Marrian Salvage, FNP  ?Multiple Vitamin (MULTIVITAMIN WITH MINERALS) TABS tablet Take 1 tablet by mouth daily.    [provider]  ?Turmeric 500 MG TABS Take 500 mg by mouth daily. ?Patient not taking: Reported on 03/06/2022    [provider]  ?valsartan-hydrochlorothiazide (DIOVAN HCT) 160-25 MG tablet Take 1 tablet by mouth daily. 12/12/21   Marrian Salvage, [place]  ?vitamin B-12 (CYANOCOBALAMIN) 100 MCG tablet Take 100 mcg by mouth daily. ?Patient not taking: Reported on 03/06/2022    [provider]  ?vitamin E 1000 UNIT capsule Take 1,000 Units by mouth daily. ?Patient not taking: Reported on 03/06/2022    [provider]  ?vitamin k 100 MCG tablet Take 100 mcg by mouth daily. ?Patient not taking: Reported on 03/06/2022    [provider]  ?Zinc 50 MG CAPS Take by mouth. ?Patient not taking: Reported on 03/06/2022    [provider]  ? ? ?Family History ?Family History  ?Problem Relation Age of Onset  ? Diabetes Mother   ? Hypertension Mother   ? Heart failure Mother   ? Stroke Mother   ? High Cholesterol Mother   ? Depression Mother   ? Anxiety disorder Mother   ? Sleep apnea Mother   ? Obesity Mother   ? COPD Father   ?     smoker  ? High blood pressure Father   ? High Cholesterol Father   ? Breast cancer Sister   ? Skin cancer Sister   ? HIV/AIDS Brother   ? Hyperlipidemia Other   ? ? ?Social History ?Social History  ? ?Tobacco Use  ? Smoking status: Some Days  ?  Packs/day: 0.30  ?  Types: Cigarettes  ?  Last attempt to quit: 12/01/2019  ?  Years since quitting: 2.2  ? Smokeless tobacco: Never  ? Tobacco comments:  ?  Working on cutting back  ?Substance Use Topics  ? Alcohol use: No  ? Drug use: No  ? ? ? ?Allergies   ?Gadolinium derivatives ? ? ?Review of Systems ?Review of Systems ?Per  HPI ? ?Physical Exam ?Triage Vital Signs ?ED Triage Vitals  ?Enc Vitals Group  ?   BP 03/15/22 1819 123/81  ?   Pulse Rate 03/15/22 1819 87  ?   Resp 03/15/22 1819 20  ?   Temp 03/15/22 1819  98.2 ?F (36.8 ?C)  ?   Temp Source 03/15/22 1819 Oral  ?   SpO2 03/15/22 1819 95 %  ?   Weight --   ?   Height --   ?   Head Circumference --   ?   Peak Flow --   ?   Pain Score 03/15/22 1817 5  ?   Pain Loc --   ?   Pain Edu? --   ?   Excl. in [place]? --   ? ?No data found. ? ?Updated Vital Signs ?BP 123/81 (BP Location: Left Arm)   Pulse 87   Temp 98.2 ?F (36.8 ?C) (Oral)   Resp 20   LMP 02/28/2017   SpO2 95%  ? ?Visual Acuity ?Right Eye Distance:   ?Left Eye Distance:   ?Bilateral Distance:   ? ?Right Eye Near:   ?Left Eye Near:    ?Bilateral Near:    ? ?Physical Exam ?Constitutional:   ?   General: She is not in acute distress. ?   Appearance: Normal appearance. She is not toxic-appearing or diaphoretic.  ?HENT:  ?   Head: Normocephalic and atraumatic.  ?   Right Ear: Tympanic membrane and ear canal normal.  ?   Left Ear: Tympanic membrane and ear canal normal.  ?   Nose: Congestion present.  ?   Mouth/Throat:  ?   Mouth: Mucous membranes are moist.  ?   Pharynx: No posterior oropharyngeal erythema.  ?Eyes:  ?   Extraocular Movements: Extraocular movements intact.  ?   Conjunctiva/sclera: Conjunctivae normal.  ?   Pupils: Pupils are equal, round, and reactive to light.  ?Cardiovascular:  ?   Rate and Rhythm: Normal rate and regular rhythm.  ?   Pulses: Normal pulses.  ?   Heart sounds: Normal heart sounds.  ?Pulmonary:  ?   Effort: Pulmonary effort is normal. No respiratory distress.  ?   Breath sounds: Normal breath sounds. No stridor. No wheezing, rhonchi or rales.  ?Abdominal:  ?   General: Abdomen is flat. Bowel sounds are normal.  ?   Palpations: Abdomen is soft.  ?Musculoskeletal:     ?   General: Normal range of motion.  ?   Cervical back: Normal range of motion.  ?Skin: ?   General: Skin is warm and dry.   ?Neurological:  ?   General: No focal deficit present.  ?   Mental Status: She is alert and oriented to person, place, and time. Mental status is at baseline.  ?Psychiatric:     ?   Mood and Affect: Mood normal.     ?   Be

## 2022-03-15 NOTE — ED Triage Notes (Addendum)
Pt has been seeing primary care since March 2nd and has been given cough syrup and antibiotics and has finished all meds with no relief. . Pt said she is coughing up green mucus. Pt said SOB and pressure.  ? ?

## 2022-03-19 ENCOUNTER — Ambulatory Visit: Payer: No Typology Code available for payment source | Admitting: Orthopedic Surgery

## 2022-03-20 ENCOUNTER — Ambulatory Visit: Payer: No Typology Code available for payment source | Admitting: Family

## 2022-03-27 ENCOUNTER — Other Ambulatory Visit: Payer: Self-pay | Admitting: Family

## 2022-03-27 DIAGNOSIS — J329 Chronic sinusitis, unspecified: Secondary | ICD-10-CM

## 2022-03-27 NOTE — Telephone Encounter (Signed)
I have called pt and relayed the message from the provider. She stated that she has called ENT and has an appointment for 04/13/22 since she used to be their pt.  ?

## 2022-03-27 NOTE — Telephone Encounter (Signed)
Patient called back because she is still not feeling well, she has been coughing and spitting up green mucus. Advised to come in and be evaluated but she declined stating she has been here twice already and also urgent care. She would like to get antibiotics to help her, Please advise.  ?

## 2022-03-28 ENCOUNTER — Ambulatory Visit: Payer: No Typology Code available for payment source | Admitting: Family Medicine

## 2022-03-28 ENCOUNTER — Encounter: Payer: Self-pay | Admitting: Family Medicine

## 2022-03-28 NOTE — Progress Notes (Deleted)
? ? ?PATIENT: Amanda Mcintosh ?DOB: 1971/04/25 ? ?REASON FOR VISIT: follow up ?HISTORY FROM: patient ? ?No chief complaint on file. ?  ?HISTORY OF PRESENT ILLNESS: ? ?56/43/32 ALL:  ?Amanda Mcintosh returns for follow up for OSA on CPAP.  ? ?She has been treated for a sinus infection with two antibiotics and two prednisone tapers. Last seen yesterday by UC. Chest xray was clear and vitals normal.  ? ? ? ?03/28/2021 ALL: ?She returns for follow up for OSA on CPAP. She has continued fairly consistent use. She does not take it with her if she goes out of town. She spent 2 weeks with her daughter and did not take it with her. She feels that she sleeps better without CPAP but recognizes health benefits of using it. She has less headaches and does not wake feeling congested. She has recently started smoking. She has moved into a rental home that she doesn't love and her mother passed away. She is under more stress. She has a plan in place to stop smoking and increase exercise. She would like to lose weight.  ? ? ? ?9/51/8841 ALL:  ?Amanda Mcintosh is a 51 y.o. female here today for follow up for OSA on CPAP. She is doing well. She admits there are days when she doesn't use CPAP. She went out of town this past weekend and did not use her machine. She does note benefit of using CPAP.  She wakes feeling well rested.  ? ?Compliance report dated 06/25/2020 through 09/22/2020 reveals that she used CPAP 69 of the past 90 days for compliance of 77%.  She used CPAP greater than 4 hours 64 of the past 90 days for compliance of 71%.  Average usage was 6 hours and 55 minutes.  Residual AHI was 0.6 on 7 to 14 cm of water and an EPR of three.  There was no significant leak noted. ? ?HISTORY: (copied from my note on 06/27/2020) ? ?Amanda Mcintosh is a 51 y.o. female here today for follow up for OSA on CPAP. She admits that she has not been compliant with CPAP usage recently. She has gone on several vacations and not taken her machine.  She knows that she needs to use it. No other concerns. She has recently quit smoking. She is working on [employer].  ?  ?Compliance report dated 03/17/2020 through 06/14/2020 reveals that she used CPAP 26 of the last 90 days for compliance of 29%.  She is CPAP greater than 4 hours 23 of the past 90 days for compliance of 26%.  Average usage on days used was 6 hours and 2 minutes.  Residual AHI was 0.7 on 7 to 14 cm of water and an EPR of 3.  There was no significant leak noted. ?  ?HISTORY: (copied from my note on 06/24/2019) ?  ?Amanda Mcintosh is a 51 y.o. female here today for follow up of OSA on CPAP.  She reports doing very well with CPAP therapy.  She has noted that she is grinding her teeth less.  Compliance download dated 05/25/2019 through 06/23/2019 reveals that she is using her machine 28 out of the last 30 days for compliance of 93%.  28 days were used for greater than 4 hours for compliance of 93%.  Average usage was 6 hours and 1 minute.  AHI was 0.5 on 7 to 14 cm of water and an EPR level of 3.  There was no significant leak.  She returns today  for evaluation. ?  ?  ?History (copied from Amanda Mcintosh note on 03/03/2019) ?  ?Dear Amanda Mcintosh,  ?  ?I saw your patient, Amanda Mcintosh, upon your kind request in my sleep clinic today for initial consultation of her sleep disorder, in particular, concern for underlying obstructive sleep apnea. The patient is unaccompanied today. As you know, Amanda Mcintosh is a 51 year old right-handed woman with an underlying medical history of reflux disease, vitamin D deficiency, smoking, headaches, dizziness, seasonal allergies, chronic sinusitis, and morbid obesity with a BMI of over 40, who reports snoring and excessive daytime somnolence as well as witnessed apneas per family?s report. I reviewed your office note from 01/28/2019. She had a home sleep test about 4-1/2 years ago and I reviewed the results. Test date was 09/14/2014, overall AHI was 9.2 per hour, O2 nadir  was 81%. She weighed 231 pounds at the time, BMI of 38. Her Epworth sleepiness score is 7 out of 24 today, fatigue score is 18 out of 63. She?s not sleeping well. She reports a bedtime of 9 PM, some difficulty falling asleep. She does turn her bedroom TV off at 9 PM. She has a rise time of 6 AM. No night to night nocturia, occasional AM HAs, does have nasal congestion. Mom had OSA.  ?She had T/A as a child, no asthma. See Amanda. Erik Mcintosh for sinus d/s.  ?She lives with her husband, she has 1 child. She works for [employer]. She smokes one pack per day, does not utilize alcohol typically and does not drink caffeine on a regular basis.  ?She is fasting from her church, has an appointment for FU in wt management soon. She has seen an endocrinologist. She has seen an integrative health provider. She has had stress, lost her mom 2 years ago. Recently lost a friend. ? ? ?REVIEW OF SYSTEMS: Out of a complete 14 system review of symptoms, the patient complains only of the following symptoms, congestion, sinus pain,  and all other reviewed systems are negative. ? ?ESS: 8 ?FSS:30 ? ?ALLERGIES: ?Allergies  ?Allergen Reactions  ? Gadolinium Derivatives Other (See Comments)  ?  PT STARTED SHAKING AND FELT VERY COLD INTERNALLY BUT FACE WAS VERY FLUSHED. THIS WAS A DELAYED REACTION (ABOUT 1 HOUR AFTER INJECTION). SHE TOOK BENADRYL AND HYDRATED AND SYMPTOMS SUBSIDED  ? ? ?HOME MEDICATIONS: ?Outpatient Medications Prior to Visit  ?Medication Sig Dispense Refill  ? albuterol (VENTOLIN HFA) 108 (90 Base) MCG/ACT inhaler Inhale 1-2 puffs into the lungs every 6 (six) hours as needed for wheezing or shortness of breath. 1 each 0  ? Ascorbic Acid (VITAMIN C) 1000 MG tablet Take 1,000 mg by mouth daily. (Patient not taking: Reported on 03/06/2022)    ? benzonatate (TESSALON) 100 MG capsule Take 1 capsule (100 mg total) by mouth every 8 (eight) hours as needed for cough. 21 capsule 0  ? cholecalciferol (VITAMIN D3) 25 MCG (1000 UT) tablet Take 1,000  Units by mouth daily.    ? cyclobenzaprine (FLEXERIL) 5 MG tablet Take 1 tablet (5 mg total) by mouth 3 (three) times daily as needed for muscle spasms. 30 tablet 0  ? doxycycline (VIBRA-TABS) 100 MG tablet Take 1 tablet (100 mg total) by mouth 2 (two) times daily. 14 tablet 0  ? escitalopram (LEXAPRO) 10 MG tablet TAKE 1 TABLET BY MOUTH EVERY DAY (Patient not taking: Reported on 03/06/2022) 90 tablet 1  ? HYDROcodone bit-homatropine (HYCODAN) 5-1.5 MG/5ML syrup Take 5 mLs by mouth every 8 (eight) hours as needed  for cough. 120 mL 0  ? metFORMIN (GLUCOPHAGE XR) 500 MG 24 hr tablet Take 1 tablet (500 mg total) by mouth daily with breakfast. 90 tablet 1  ? Multiple Vitamin (MULTIVITAMIN WITH MINERALS) TABS tablet Take 1 tablet by mouth daily.    ? predniSONE (STERAPRED UNI-PAK 21 TAB) 10 MG (21) TBPK tablet Take by mouth daily. Take 6 tabs by mouth daily  for 2 days, then 5 tabs for 2 days, then 4 tabs for 2 days, then 3 tabs for 2 days, 2 tabs for 2 days, then 1 tab by mouth daily for 2 days 42 tablet 0  ? Turmeric 500 MG TABS Take 500 mg by mouth daily. (Patient not taking: Reported on 03/06/2022)    ? valsartan-hydrochlorothiazide (DIOVAN HCT) 160-25 MG tablet Take 1 tablet by mouth daily. 90 tablet 1  ? vitamin B-12 (CYANOCOBALAMIN) 100 MCG tablet Take 100 mcg by mouth daily. (Patient not taking: Reported on 03/06/2022)    ? vitamin E 1000 UNIT capsule Take 1,000 Units by mouth daily. (Patient not taking: Reported on 03/06/2022)    ? vitamin k 100 MCG tablet Take 100 mcg by mouth daily. (Patient not taking: Reported on 03/06/2022)    ? Zinc 50 MG CAPS Take by mouth. (Patient not taking: Reported on 03/06/2022)    ? ?No facility-administered medications prior to visit.  ? ? ?PAST MEDICAL HISTORY: ?Past Medical History:  ?Diagnosis Date  ? Fibroids   ? Heartburn   ? History of headache   ? Hyperlipidemia   ? Hypertension   ? Obesity   ? Thyroid condition   ? Vitamin D deficiency   ? ? ?PAST SURGICAL HISTORY: ?Past Surgical  History:  ?Procedure Laterality Date  ? CHOLECYSTECTOMY  2006  ? ROBOT ASSISTED MYOMECTOMY  2011  ? TONSILLECTOMY    ? WISDOM TOOTH EXTRACTION    ? ? ?FAMILY HISTORY: ?Family History  ?Problem Relation Age of

## 2022-03-28 NOTE — Patient Instructions (Incomplete)

## 2022-04-03 ENCOUNTER — Encounter (HOSPITAL_COMMUNITY): Payer: Self-pay

## 2022-04-03 ENCOUNTER — Other Ambulatory Visit: Payer: Self-pay

## 2022-04-03 ENCOUNTER — Emergency Department (HOSPITAL_COMMUNITY)
Admission: EM | Admit: 2022-04-03 | Discharge: 2022-04-03 | Disposition: A | Discharge status: Home / Self Care | Payer: No Typology Code available for payment source | Attending: Student | Admitting: Student

## 2022-04-03 ENCOUNTER — Telehealth: Payer: Self-pay

## 2022-04-03 ENCOUNTER — Emergency Department (HOSPITAL_COMMUNITY): Payer: No Typology Code available for payment source

## 2022-04-03 DIAGNOSIS — R059 Cough, unspecified: Secondary | ICD-10-CM

## 2022-04-03 DIAGNOSIS — E876 Hypokalemia: Secondary | ICD-10-CM | POA: Diagnosis not present

## 2022-04-03 DIAGNOSIS — F17210 Nicotine dependence, cigarettes, uncomplicated: Secondary | ICD-10-CM | POA: Insufficient documentation

## 2022-04-03 DIAGNOSIS — Z7984 Long term (current) use of oral hypoglycemic drugs: Secondary | ICD-10-CM | POA: Insufficient documentation

## 2022-04-03 DIAGNOSIS — D72829 Elevated white blood cell count, unspecified: Secondary | ICD-10-CM | POA: Diagnosis not present

## 2022-04-03 DIAGNOSIS — R42 Dizziness and giddiness: Secondary | ICD-10-CM | POA: Diagnosis present

## 2022-04-03 DIAGNOSIS — E039 Hypothyroidism, unspecified: Secondary | ICD-10-CM | POA: Insufficient documentation

## 2022-04-03 DIAGNOSIS — Z79899 Other long term (current) drug therapy: Secondary | ICD-10-CM | POA: Diagnosis not present

## 2022-04-03 DIAGNOSIS — I10 Essential (primary) hypertension: Secondary | ICD-10-CM | POA: Diagnosis not present

## 2022-04-03 DIAGNOSIS — R519 Headache, unspecified: Secondary | ICD-10-CM | POA: Diagnosis not present

## 2022-04-03 LAB — CBC
HCT: 40.7 % (ref 36.0–46.0)
Hemoglobin: 13.6 g/dL (ref 12.0–15.0)
MCH: 30 pg (ref 26.0–34.0)
MCHC: 33.4 g/dL (ref 30.0–36.0)
MCV: 89.6 fL (ref 80.0–100.0)
Platelets: 397 10*3/uL (ref 150–400)
RBC: 4.54 MIL/uL (ref 3.87–5.11)
RDW: 12.2 % (ref 11.5–15.5)
WBC: 11.6 10*3/uL — ABNORMAL HIGH (ref 4.0–10.5)
nRBC: 0 % (ref 0.0–0.2)

## 2022-04-03 LAB — I-STAT BETA HCG BLOOD, ED (MC, WL, AP ONLY): I-stat hCG, quantitative: 8 m[IU]/mL — ABNORMAL HIGH (ref <5)

## 2022-04-03 LAB — BASIC METABOLIC PANEL
Anion gap: 9 (ref 5–15)
BUN: 13 mg/dL (ref 6–20)
CO2: 25 mmol/L (ref 22–32)
Calcium: 9.4 mg/dL (ref 8.9–10.3)
Chloride: 104 mmol/L (ref 98–111)
Creatinine, Ser: 0.84 mg/dL (ref 0.44–1.00)
GFR, Estimated: >60 mL/min (ref >60)
Glucose, Bld: 96 mg/dL (ref 70–99)
Potassium: 3.3 mmol/L — ABNORMAL LOW (ref 3.5–5.1)
Sodium: 138 mmol/L (ref 135–145)

## 2022-04-03 LAB — URINALYSIS, ROUTINE W REFLEX MICROSCOPIC
Bilirubin Urine: NEGATIVE
Glucose, UA: NEGATIVE mg/dL
Ketones, ur: NEGATIVE mg/dL
Nitrite: NEGATIVE
Protein, ur: NEGATIVE mg/dL
Specific Gravity, Urine: 1.011 (ref 1.005–1.030)
pH: 5 (ref 5.0–8.0)

## 2022-04-03 LAB — PREGNANCY, URINE: Preg Test, Ur: NEGATIVE

## 2022-04-03 MED ORDER — LACTATED RINGERS IV BOLUS
1000.0000 mL | Freq: Once | INTRAVENOUS | Status: AC
  Administered 2022-04-03: 1000 mL via INTRAVENOUS

## 2022-04-03 MED ORDER — GUAIFENESIN ER 600 MG PO TB12: 600.0000 mg | ORAL_TABLET | Freq: Two times a day (BID) | ORAL | 0 refills | Status: AC

## 2022-04-03 MED ORDER — GUAIFENESIN 100 MG/5ML PO LIQD
5.0000 mL | Freq: Once | ORAL | Status: AC
  Administered 2022-04-03: 5 mL via ORAL
  Filled 2022-04-03: qty 5

## 2022-04-03 MED ORDER — LACTATED RINGERS IV BOLUS: 1000.0000 mL | Freq: Once | INTRAVENOUS | Status: DC

## 2022-04-03 MED ORDER — PROCHLORPERAZINE EDISYLATE 10 MG/2ML IJ SOLN
10.0000 mg | Freq: Once | INTRAMUSCULAR | Status: AC
  Administered 2022-04-03: 10 mg via INTRAVENOUS
  Filled 2022-04-03: qty 2

## 2022-04-03 MED ORDER — DIPHENHYDRAMINE HCL 50 MG/ML IJ SOLN
25.0000 mg | Freq: Once | INTRAMUSCULAR | Status: AC
  Administered 2022-04-03: 25 mg via INTRAVENOUS
  Filled 2022-04-03: qty 1

## 2022-04-03 MED ORDER — FLUTICASONE PROPIONATE 50 MCG/ACT NA SUSP: 1.0000 | Freq: Two times a day (BID) | NASAL | 2 refills | Status: AC

## 2022-04-03 NOTE — Telephone Encounter (Signed)
Nurse Assessment ?Nurse: Rolin Barry, RN, Levada Dy Date/Time Eilene Ghazi Time): 04/03/2022 11:15:54 AM ?Confirm and document reason for call. If ?symptomatic, describe symptoms. ?---Caller states they have had a sinus infection and ?Bronchitis since last month and was treated multiple ?times with prescription medications but still have ?not been feeling well. Has been having dizzy spells ?that make her feel like she is about to faint. States the ?room looks like it is spinning. She is still coughing and ?coughing up mucus and she feels like an ocean is in ?her ear (congestion). No temp that she is aware of. ?Does the patient have any new or worsening ?symptoms? ---Yes ?Will a triage be completed? ---Yes ?Related visit to physician within the last 2 weeks? ---No ?Does the PT have any chronic conditions? (i.e. ?diabetes, asthma, this includes High risk factors for ?pregnancy, etc.) ?---Yes ?List chronic conditions. ---HTN Metformin for weight loss and to lower blood ?sugar, states that she is not diabetic. ?Is the patient pregnant or possibly pregnant? (Ask ?all females between the ages of 77-55) ---No ?Is this a behavioral health or substance abuse call? ---No ?PLEASE NOTE: All timestamps contained within this report are represented as [place] Standard Time. ?CONFIDENTIALTY NOTICE: This fax transmission is intended only for the addressee. It contains information that is legally privileged, confidential or ?otherwise protected from use or disclosure. If you are not the intended recipient, you are strictly prohibited from reviewing, disclosing, copying using ?or disseminating any of this information or taking any action in reliance on or regarding this information. If you have received this fax in error, please ?notify us immediately by telephone so that we can arrange for its return to [place]. Phone: [phone], Toll-Free: [phone], Fax: [phone] ?Page: 2 of 2 ?Call Id: [id number] ?Guidelines ?Guideline Title Affirmed Question  Affirmed Notes Nurse Date/Time (Eastern ?Time) ?Dizziness - Vertigo [1] Weakness (i.e., ?paralysis, loss of ?muscle strength) ?of the face, arm or ?leg on one side of ?the body AND [2] ?sudden onset AND ?[3] present now ?Rolin Barry, RN, Levada Dy 04/03/2022 11:18:29 ?AM ?Disp. Time (Eastern ?Time) Disposition Final User ?04/03/2022 11:25:02 AM 911 Outcome Documentation Deaton, RN, Levada Dy ?Reason: EMS is on the way. ?04/03/2022 11:20:40 AM Call EMS 911 Now Yes Deaton, RN, Levada Dy ?Caller Disagree/Comply Comply ?Caller Understands Yes ?PreDisposition Did not know what to do ?Care Advice Given Per Guideline ?CALL EMS 911 NOW: * Immediate medical attention is needed. You need to hang up and call 911 (or an ambulance). * Triager ?Discretion: I'll call you back in a few minutes to be sure you were able to reach them. CARE ADVICE given per Dizziness - Vertigo ?(Adult) guideline ?

## 2022-04-03 NOTE — Discharge Instructions (Addendum)
You were seen in the emergency room for evaluation of dizziness and headache.  Your CAT scan today did not show evidence of a large stroke or tumor but there is something called a "empty sella sign".  This is usually a normal anatomic variant but if you have recurrent persistent headaches with visual deficits, you may need to see a neurologist to have a discussion about something called idiopathic intracranial hypertension.  Right now you have no evidence of this in the emergency department, but it is something to watch out for.  Please return the emergency department if you have new or worsening headaches, numbness, tingling, weakness or other concerning symptoms. ?

## 2022-04-03 NOTE — ED Provider Notes (Signed)
?[facility] ?Provider Note ? ?[name]: [id number] ?Arrival date & time: 04/03/22 1224 ? ?Chief Complaint(s) ?Dizziness ? ?HPI ?Amanda Mcintosh is a 51 y.o. female with PMH HLD, HTN, obesity, hypothyroidism who presents emergency department for evaluation of dizziness and headache.  Patient states that she contracted a viral upper respiratory infection approximately 1 month ago and has had a persistent cough since.  She has been trialed on antibiotics and steroids and was recently seen in urgent care where they doubled her steroid dose and gave her a albuterol inhaler.  She states that today she has had a worsening of a posterior headache and positional dizziness.  Denies associated visual deficits, numbness, tingling, weakness or other neurologic complaints.  Denies chest pain, abdominal pain, nausea, vomiting or other systemic symptoms. ? ? ?Dizziness ?Associated symptoms: headaches   ? ?Past Medical History ?Past Medical History:  ?Diagnosis Date  ? Fibroids   ? Heartburn   ? History of headache   ? Hyperlipidemia   ? Hypertension   ? Obesity   ? Thyroid condition   ? Vitamin D deficiency   ? ?Patient Active Problem List  ? Diagnosis Date Noted  ? OSA on CPAP 06/24/2019  ? Low back pain 04/30/2019  ? Dizziness 07/04/2017  ? Chronic seasonal allergic rhinitis 01/25/2017  ? Chronic sinusitis 01/25/2017  ? Lateral epicondylitis 01/13/2016  ? Amenorrhea 06/28/2015  ? Situational syncope 05/07/2015  ? GERD (gastroesophageal reflux disease) 04/21/2015  ? Routine general medical examination at a health care facility 04/21/2015  ? Unspecified vitamin D deficiency 12/02/2013  ? Class 2 severe obesity with serious comorbidity and body mass index (BMI) of 38.0 to 38.9 in adult [facility]) 12/02/2013  ? Iron deficiency anemia 12/02/2013  ? Tobacco use disorder 12/02/2013  ? Migraine without aura, with intractable migraine, so stated, without mention of status migrainosus 03/11/2013  ? Family  history of early CAD 09/08/2012  ? ?Home Medication(s) ?Prior to Admission medications   ?Medication Sig Start Date End Date Taking? Authorizing Provider  ?albuterol (VENTOLIN HFA) 108 (90 Base) MCG/ACT inhaler Inhale 1-2 puffs into the lungs every 6 (six) hours as needed for wheezing or shortness of breath. 03/15/22   Teodora Medici, FNP  ?Ascorbic Acid (VITAMIN C) 1000 MG tablet Take 1,000 mg by mouth daily. ?Patient not taking: Reported on 03/06/2022    [provider]  ?benzonatate (TESSALON) 100 MG capsule Take 1 capsule (100 mg total) by mouth every 8 (eight) hours as needed for cough. 03/15/22   Teodora Medici, FNP  ?cholecalciferol (VITAMIN D3) 25 MCG (1000 UT) tablet Take 1,000 Units by mouth daily.    [provider]  ?cyclobenzaprine (FLEXERIL) 5 MG tablet Take 1 tablet (5 mg total) by mouth 3 (three) times daily as needed for muscle spasms. 04/17/21   Marrian Salvage, FNP  ?doxycycline (VIBRA-TABS) 100 MG tablet Take 1 tablet (100 mg total) by mouth 2 (two) times daily. 03/06/22   Marrian Salvage, [place]  ?escitalopram (LEXAPRO) 10 MG tablet TAKE 1 TABLET BY MOUTH EVERY DAY ?Patient not taking: Reported on 03/06/2022 11/27/21   Marrian Salvage, [place]  ?HYDROcodone bit-homatropine (HYCODAN) 5-1.5 MG/5ML syrup Take 5 mLs by mouth every 8 (eight) hours as needed for cough. 03/06/22   Marrian Salvage, [place]  ?metFORMIN (GLUCOPHAGE XR) 500 MG 24 hr tablet Take 1 tablet (500 mg total) by mouth daily with breakfast. 12/14/21   Marrian Salvage, FNP  ?Multiple Vitamin (MULTIVITAMIN WITH  MINERALS) TABS tablet Take 1 tablet by mouth daily.    [provider]  ?predniSONE (STERAPRED UNI-PAK 21 TAB) 10 MG (21) TBPK tablet Take by mouth daily. Take 6 tabs by mouth daily  for 2 days, then 5 tabs for 2 days, then 4 tabs for 2 days, then 3 tabs for 2 days, 2 tabs for 2 days, then 1 tab by mouth daily for 2 days 03/15/22   Teodora Medici, FNP  ?Turmeric 500 MG TABS Take 500 mg by  mouth daily. ?Patient not taking: Reported on 03/06/2022    [provider]  ?valsartan-hydrochlorothiazide (DIOVAN HCT) 160-25 MG tablet Take 1 tablet by mouth daily. 12/12/21   Marrian Salvage, [place]  ?vitamin B-12 (CYANOCOBALAMIN) 100 MCG tablet Take 100 mcg by mouth daily. ?Patient not taking: Reported on 03/06/2022    [provider]  ?vitamin E 1000 UNIT capsule Take 1,000 Units by mouth daily. ?Patient not taking: Reported on 03/06/2022    [provider]  ?vitamin k 100 MCG tablet Take 100 mcg by mouth daily. ?Patient not taking: Reported on 03/06/2022    [provider]  ?Zinc 50 MG CAPS Take by mouth. ?Patient not taking: Reported on 03/06/2022    [provider]  ?                                                                                                                                  ?Past Surgical History ?Past Surgical History:  ?Procedure Laterality Date  ? CHOLECYSTECTOMY  2006  ? ROBOT ASSISTED MYOMECTOMY  2011  ? TONSILLECTOMY    ? WISDOM TOOTH EXTRACTION    ? ?Family History ?Family History  ?Problem Relation Age of Onset  ? Diabetes Mother   ? Hypertension Mother   ? Heart failure Mother   ? Stroke Mother   ? High Cholesterol Mother   ? Depression Mother   ? Anxiety disorder Mother   ? Sleep apnea Mother   ? Obesity Mother   ? COPD Father   ?     smoker  ? High blood pressure Father   ? High Cholesterol Father   ? Breast cancer Sister   ? Skin cancer Sister   ? HIV/AIDS Brother   ? Hyperlipidemia Other   ? ? ?Social History ?Social History  ? ?Tobacco Use  ? Smoking status: Some Days  ?  Packs/day: 0.30  ?  Types: Cigarettes  ?  Last attempt to quit: 12/01/2019  ?  Years since quitting: 2.3  ? Smokeless tobacco: Never  ? Tobacco comments:  ?  Working on cutting back  ?Substance Use Topics  ? Alcohol use: No  ? Drug use: No  ? ?Allergies ?Gadolinium derivatives ? ?Review of Systems ?Review of Systems  ?Respiratory:  Positive for cough.   ?Neurological:   Positive for dizziness and headaches.  ? ?Physical Exam ?Vital Signs  ?I have reviewed the  triage vital signs ?BP 130/64 (BP Location: Left Arm)   Pulse 70   Temp 98.6 ?F (37 ?C) (Oral)   Resp 16   Ht '5\' 5"'$  (1.651 m)   Wt 112.5 kg   LMP 02/28/2017   SpO2 98%   BMI 41.27 kg/m?  ? ?Physical Exam ?Vitals and nursing note reviewed.  ?Constitutional:   ?   General: She is not in acute distress. ?   Appearance: She is well-developed.  ?HENT:  ?   Head: Normocephalic and atraumatic.  ?Eyes:  ?   Conjunctiva/sclera: Conjunctivae normal.  ?Cardiovascular:  ?   Rate and Rhythm: Normal rate and regular rhythm.  ?   Heart sounds: No murmur heard. ?Pulmonary:  ?   Effort: Pulmonary effort is normal. No respiratory distress.  ?   Breath sounds: Normal breath sounds.  ?Abdominal:  ?   Palpations: Abdomen is soft.  ?   Tenderness: There is no abdominal tenderness.  ?Musculoskeletal:     ?   General: No swelling.  ?   Cervical back: Neck supple.  ?Skin: ?   General: Skin is warm and dry.  ?   Capillary Refill: Capillary refill takes less than 2 seconds.  ?Neurological:  ?   Mental Status: She is alert.  ?   Cranial Nerves: No cranial nerve deficit.  ?   Sensory: No sensory deficit.  ?   Motor: No weakness.  ?Psychiatric:     ?   Mood and Affect: Mood normal.  ? ? ?ED Results and Treatments ?Labs ?(all labs ordered are listed, but only abnormal results are displayed) ?Labs Reviewed  ?BASIC METABOLIC PANEL - Abnormal; Notable for the following components:  ?    Result Value  ? Potassium 3.3 (*)   ? All other components within normal limits  ?CBC - Abnormal; Notable for the following components:  ? WBC 11.6 (*)   ? All other components within normal limits  ?URINALYSIS, ROUTINE W REFLEX MICROSCOPIC - Abnormal; Notable for the following components:  ? Hgb urine dipstick SMALL (*)   ? Leukocytes,Ua TRACE (*)   ? Bacteria, UA FEW (*)   ? All other components within normal limits  ?I-STAT BETA HCG BLOOD, ED ([facility], [facility], [facility] ONLY) -  Abnormal; Notable for the following components:  ? I-stat hCG, quantitative 8.0 (*)   ? All other components within normal limits  ?PREGNANCY, URINE  ?CBG MONITORING, ED  ?

## 2022-04-03 NOTE — ED Triage Notes (Signed)
Pt BIB [facility] from home c/o dizziness x 2-3 days. Pt states she has been dealing with bronchitis for a couple weeks and has not gotten better. Pt now is dizzy when standing and states she feels like she is going to pass out and has to sit down.  ? ? ?142/82 ?108 CBG ?80 HR ?99 RA ?20g LAC ?

## 2022-04-03 NOTE — ED Notes (Signed)
Pt verbalized understanding of d/c instructions, meds and followup care. Denies questions. VSS, no distress noted. Steady gait to exit with all belongings.  ?

## 2022-04-09 ENCOUNTER — Encounter: Payer: Self-pay | Admitting: Family Medicine

## 2022-04-09 ENCOUNTER — Ambulatory Visit (INDEPENDENT_AMBULATORY_CARE_PROVIDER_SITE_OTHER): Payer: No Typology Code available for payment source | Admitting: Family Medicine

## 2022-04-09 VITALS — BP 95/49 | HR 85 | Ht 65.0 in | Wt 261.0 lb

## 2022-04-09 DIAGNOSIS — J329 Chronic sinusitis, unspecified: Secondary | ICD-10-CM

## 2022-04-09 DIAGNOSIS — I10 Essential (primary) hypertension: Secondary | ICD-10-CM | POA: Diagnosis not present

## 2022-04-09 DIAGNOSIS — G43009 Migraine without aura, not intractable, without status migrainosus: Secondary | ICD-10-CM

## 2022-04-09 NOTE — Patient Instructions (Addendum)
Make sure you are eating regular meals and staying well hydrated.  ?BP was on the low side today.  ?Start monitoring your blood pressure at home before taking your medication ?If top number is less than 110 then wait an hour or two and recheck before taking medication. ?If top number less than 120, just take a half tablet and then recheck mid day to make sure BP is not getting too high. If starting to rise, take the other half tab mid-day.  ?

## 2022-04-09 NOTE — Progress Notes (Signed)
? ?Acute Office Visit ? ?Subjective:  ? ? Patient ID: Amanda Mcintosh, female    DOB: June 23, 1971, 51 y.o.   MRN: [id number] ? ?CC: ED follow-up ? ? ?HPI ?Patient is in today for ED follow-up. ? ?04/03/22 ED - Patient presented for dizziness, headache, persistent cough x1 month after viral URI. She had already  taken antibiotics and 2 rounds of steroids and given an albuterol inhaler. Physical exam was unremarkable along with stable labs other than mild hypokalemia and mild leukocytosis. CT head showed possible empty sella sign that is normal anatomic variant and clinical presentation was not consistent with pseudotumor. IV fluids and headache cocktail given and she noticed improvement. Discharged home with follow-up precautions.  ? ?Reports she was on the phone with nurse triage last week regarding the dizziness, coughing, L sided facial pressure, neck stiffness/arm tingling. They called EMS 210/100s and took her ED for visit described above. She has an ENT referral and is scheduled to see them this week. States ED recommended she follow-up with neurology for CT findings and migraines. Cough has improved, but continues with occasional sputum production. Continues with sinus pressure.  ? ?She did have some mild dizziness this morning, but she hadn't eaten anything yet and it did improve after eating. She denies any syncope, chest pain, dyspnea, vision changes, edema, blood in sputum.  ? ? ? ? ? ?Past Medical History:  ?Diagnosis Date  ? Fibroids   ? Heartburn   ? History of headache   ? Hyperlipidemia   ? Hypertension   ? Obesity   ? Thyroid condition   ? Vitamin D deficiency   ? ? ?Past Surgical History:  ?Procedure Laterality Date  ? CHOLECYSTECTOMY  2006  ? ROBOT ASSISTED MYOMECTOMY  2011  ? TONSILLECTOMY    ? WISDOM TOOTH EXTRACTION    ? ? ?Family History  ?Problem Relation Age of Onset  ? Diabetes Mother   ? Hypertension Mother   ? Heart failure Mother   ? Stroke Mother   ? High Cholesterol Mother   ?  Depression Mother   ? Anxiety disorder Mother   ? Sleep apnea Mother   ? Obesity Mother   ? COPD Father   ?     smoker  ? High blood pressure Father   ? High Cholesterol Father   ? Breast cancer Sister   ? Skin cancer Sister   ? HIV/AIDS Brother   ? Hyperlipidemia Other   ? ? ?Social History  ? ?Socioeconomic History  ? Marital status: Married  ?  Spouse name: Darryl  ? Number of children: 1  ? Years of education: Not on file  ? Highest education level: Not on file  ?Occupational History  ? Occupation: Architect  ?  Employer: [employer]  ?Tobacco Use  ? Smoking status: Some Days  ?  Packs/day: 0.30  ?  Types: Cigarettes  ?  Last attempt to quit: 12/01/2019  ?  Years since quitting: 2.3  ? Smokeless tobacco: Never  ? Tobacco comments:  ?  Working on cutting back  ?Substance and Sexual Activity  ? Alcohol use: No  ? Drug use: No  ? Sexual activity: Not on file  ?Other Topics Concern  ? Not on file  ?Social History Narrative  ? Not on file  ? ?Social Determinants of Health  ? ?Financial Resource Strain: Not on file  ?Food Insecurity: No Food Insecurity  ? Worried About Charity fundraiser in the Last  Year: Never true  ? Ran Out of Food in the Last Year: Never true  ?Transportation Needs: Not on file  ?Physical Activity: Not on file  ?Stress: Not on file  ?Social Connections: Not on file  ?Intimate Partner Violence: Not on file  ? ? ?Outpatient Medications Prior to Visit  ?Medication Sig Dispense Refill  ? albuterol (VENTOLIN HFA) 108 (90 Base) MCG/ACT inhaler Inhale 1-2 puffs into the lungs every 6 (six) hours as needed for wheezing or shortness of breath. 1 each 0  ? Ascorbic Acid (VITAMIN C) 1000 MG tablet Take 1,000 mg by mouth daily. (Patient not taking: Reported on 03/06/2022)    ? benzonatate (TESSALON) 100 MG capsule Take 1 capsule (100 mg total) by mouth every 8 (eight) hours as needed for cough. 21 capsule 0  ? cholecalciferol (VITAMIN D3) 25 MCG (1000 UT) tablet Take 1,000 Units by mouth daily.     ? cyclobenzaprine (FLEXERIL) 5 MG tablet Take 1 tablet (5 mg total) by mouth 3 (three) times daily as needed for muscle spasms. 30 tablet 0  ? doxycycline (VIBRA-TABS) 100 MG tablet Take 1 tablet (100 mg total) by mouth 2 (two) times daily. 14 tablet 0  ? escitalopram (LEXAPRO) 10 MG tablet TAKE 1 TABLET BY MOUTH EVERY DAY (Patient not taking: Reported on 03/06/2022) 90 tablet 1  ? fluticasone (FLONASE) 50 MCG/ACT nasal spray Place 1 spray into both nostrils in the morning and at bedtime. 9.9 mL 2  ? guaiFENesin (MUCINEX) 600 MG 12 hr tablet Take 1 tablet (600 mg total) by mouth 2 (two) times daily for 14 days. 28 tablet 0  ? HYDROcodone bit-homatropine (HYCODAN) 5-1.5 MG/5ML syrup Take 5 mLs by mouth every 8 (eight) hours as needed for cough. 120 mL 0  ? metFORMIN (GLUCOPHAGE XR) 500 MG 24 hr tablet Take 1 tablet (500 mg total) by mouth daily with breakfast. 90 tablet 1  ? Multiple Vitamin (MULTIVITAMIN WITH MINERALS) TABS tablet Take 1 tablet by mouth daily.    ? predniSONE (STERAPRED UNI-PAK 21 TAB) 10 MG (21) TBPK tablet Take by mouth daily. Take 6 tabs by mouth daily  for 2 days, then 5 tabs for 2 days, then 4 tabs for 2 days, then 3 tabs for 2 days, 2 tabs for 2 days, then 1 tab by mouth daily for 2 days 42 tablet 0  ? Turmeric 500 MG TABS Take 500 mg by mouth daily. (Patient not taking: Reported on 03/06/2022)    ? valsartan-hydrochlorothiazide (DIOVAN HCT) 160-25 MG tablet Take 1 tablet by mouth daily. 90 tablet 1  ? vitamin B-12 (CYANOCOBALAMIN) 100 MCG tablet Take 100 mcg by mouth daily. (Patient not taking: Reported on 03/06/2022)    ? vitamin E 1000 UNIT capsule Take 1,000 Units by mouth daily. (Patient not taking: Reported on 03/06/2022)    ? vitamin k 100 MCG tablet Take 100 mcg by mouth daily. (Patient not taking: Reported on 03/06/2022)    ? Zinc 50 MG CAPS Take by mouth. (Patient not taking: Reported on 03/06/2022)    ? ?No facility-administered medications prior to visit.  ? ? ?Allergies  ?Allergen Reactions   ? Gadolinium Derivatives Other (See Comments)  ?  PT STARTED SHAKING AND FELT VERY COLD INTERNALLY BUT FACE WAS VERY FLUSHED. THIS WAS A DELAYED REACTION (ABOUT 1 HOUR AFTER INJECTION). SHE TOOK BENADRYL AND HYDRATED AND SYMPTOMS SUBSIDED  ? ? ?Review of Systems ?All review of systems negative except what is listed in the HPI ? ?   ?  Objective:  ?  ?Physical Exam ?Vitals reviewed.  ?Constitutional:   ?   Appearance: Normal appearance. She is obese.  ?Cardiovascular:  ?   Rate and Rhythm: Normal rate and regular rhythm.  ?Pulmonary:  ?   Effort: Pulmonary effort is normal.  ?   Breath sounds: Normal breath sounds.  ?Abdominal:  ?   General: Abdomen is flat. Bowel sounds are normal.  ?   Palpations: Abdomen is soft.  ?Musculoskeletal:  ?   Cervical back: Normal range of motion and neck supple.  ?Skin: ?   General: Skin is warm and dry.  ?Neurological:  ?   General: No focal deficit present.  ?   Mental Status: She is alert and oriented to person, place, and time. Mental status is at baseline.  ?Psychiatric:     ?   Mood and Affect: Mood normal.     ?   Behavior: Behavior normal.     ?   Thought Content: Thought content normal.     ?   Judgment: Judgment normal.  ? ? ?BP (!) 95/49   Pulse 85   Ht '5\' 5"'$  (1.651 m)   Wt 261 lb (118.4 kg)   LMP 02/28/2017   SpO2 96%   BMI 43.43 kg/m?  ?Wt Readings from Last 3 Encounters:  ?04/09/22 261 lb (118.4 kg)  ?04/03/22 248 lb (112.5 kg)  ?03/06/22 262 lb 12.8 oz (119.2 kg)  ? ? ?Health Maintenance Due  ?Topic Date Due  ? Zoster Vaccines- Shingrix (1 of 2) Never done  ? COVID-19 Vaccine (4 - Booster for Pfizer series) 01/30/2021  ? ? ?There are no preventive care reminders to display for this patient. ? ? ?Lab Results  ?Component Value Date  ? TSH 0.99 03/31/2021  ? ?Lab Results  ?Component Value Date  ? WBC 11.6 (H) 04/03/2022  ? HGB 13.6 04/03/2022  ? HCT 40.7 04/03/2022  ? MCV 89.6 04/03/2022  ? PLT 397 04/03/2022  ? ?Lab Results  ?Component Value Date  ? NA 138 04/03/2022   ? K 3.3 (L) 04/03/2022  ? CO2 25 04/03/2022  ? GLUCOSE 96 04/03/2022  ? BUN 13 04/03/2022  ? CREATININE 0.84 04/03/2022  ? BILITOT 0.5 03/02/2022  ? ALKPHOS 68 03/02/2022  ? AST 12 03/02/2022  ? ALT 19 03/02/2022

## 2022-04-10 ENCOUNTER — Telehealth: Payer: Self-pay | Admitting: Family

## 2022-04-10 NOTE — Telephone Encounter (Signed)
Patient would like a call back regarding FMLA paperwork left during her OV on 04/10 with Caleen Jobs. Please advise.  ?

## 2022-04-10 NOTE — Telephone Encounter (Signed)
I have called Amanda Mcintosh and notified her that I got her paperwork and Mickel Baas will not be back until next week. We will work on them then. Amanda Mcintosh reported understanding. She does want a copy back after we fax the completed form to her [place].  ?

## 2022-04-11 ENCOUNTER — Ambulatory Visit: Payer: No Typology Code available for payment source | Admitting: Psychologist

## 2022-04-17 DIAGNOSIS — Z0279 Encounter for issue of other medical certificate: Secondary | ICD-10-CM

## 2022-04-17 NOTE — Telephone Encounter (Signed)
I have called pt back and she stated that she still has an ongoing headache, cough and dizziness. This is about the same as before. She reports that she just saw ENT and they gave her some antibiotics that she is still working through. She has a Neurology appt at the end of May. Pt is wondering if she may have vertigo. I have informed her that is something that she will have to get worked up and she stated that she has gone to so many appointments at this time.  ? ?Her FMLA form has been completed and faxed. I have placed a copy in her folder and since there will be a charge for it she will have to sign the sheet. She stated that she will sign the form and pick up her copy of the [facility] tomorrow. I have received fax confirmation on the FMLA forms.  ? ? ?

## 2022-04-30 ENCOUNTER — Telehealth: Payer: Self-pay | Admitting: Family

## 2022-04-30 ENCOUNTER — Other Ambulatory Visit: Payer: Self-pay | Admitting: Family

## 2022-04-30 DIAGNOSIS — R053 Chronic cough: Secondary | ICD-10-CM

## 2022-04-30 NOTE — Telephone Encounter (Signed)
Pt called stating she would like to be referred to a lung specialist to treat the respiratory symptoms that she has been experiencing. Advised pt that she may need to be seen in order to refer her. Please Advise. ?

## 2022-05-01 NOTE — Telephone Encounter (Signed)
I have called pt and informed her that we have sent the referral and she stated understanding.  ?

## 2022-05-30 ENCOUNTER — Institutional Professional Consult (permissible substitution): Payer: No Typology Code available for payment source | Admitting: Neurology

## 2022-06-06 ENCOUNTER — Other Ambulatory Visit: Payer: Self-pay | Admitting: Family

## 2022-06-20 ENCOUNTER — Telehealth: Payer: Self-pay | Admitting: Neurology

## 2022-06-20 ENCOUNTER — Telehealth: Payer: Self-pay

## 2022-06-20 ENCOUNTER — Ambulatory Visit (INDEPENDENT_AMBULATORY_CARE_PROVIDER_SITE_OTHER): Payer: No Typology Code available for payment source | Admitting: Neurology

## 2022-06-20 ENCOUNTER — Encounter: Payer: Self-pay | Admitting: Neurology

## 2022-06-20 VITALS — BP 151/84 | HR 60 | Ht 65.0 in | Wt 270.0 lb

## 2022-06-20 DIAGNOSIS — G44209 Tension-type headache, unspecified, not intractable: Secondary | ICD-10-CM

## 2022-06-20 DIAGNOSIS — G4733 Obstructive sleep apnea (adult) (pediatric): Secondary | ICD-10-CM | POA: Diagnosis not present

## 2022-06-20 DIAGNOSIS — R519 Headache, unspecified: Secondary | ICD-10-CM | POA: Diagnosis not present

## 2022-06-20 DIAGNOSIS — R030 Elevated blood-pressure reading, without diagnosis of hypertension: Secondary | ICD-10-CM | POA: Diagnosis not present

## 2022-06-20 DIAGNOSIS — G441 Vascular headache, not elsewhere classified: Secondary | ICD-10-CM | POA: Diagnosis not present

## 2022-06-20 DIAGNOSIS — Z9989 Dependence on other enabling machines and devices: Secondary | ICD-10-CM

## 2022-06-20 NOTE — Telephone Encounter (Signed)
Patient wants to know if she can get a low dose or another BP medication because her current one now is causing dizziness , she has stopped taking the BP medication. Patient stated she has been keep track of BP , BP is running around 140-150 without taking BP medication   155/90 -- BP today

## 2022-06-20 NOTE — Patient Instructions (Addendum)
It was nice to see you again today.  Please talk to your primary care about blood pressure management as I do believe elevated blood pressure values have been contributing to your headaches.  Please continue to work on stress management as tension headaches are also a contributor.  Please be consistent with your AutoPap and try not to skip any nights.  You have overall done quite well with using your machine, keep up the good work.  Please continue to work on weight loss and ask your eye Dr. About specifics on your recent eye examination, in particular, I would like to make sure that you did not have any swelling of the eye nerves.  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  I will order a brain MRI and MR venogram without contrast, we will call you to schedule these after the obtain insurance authorization.  Please follow-up routinely to see Debbora Presto, NP in 3 months.

## 2022-06-20 NOTE — Telephone Encounter (Signed)
Appt scheduled for earliest opening in office  Pt stated she did take a bp pill after our recent phone call

## 2022-06-20 NOTE — Progress Notes (Addendum)
Subjective:    Patient ID: Amanda Mcintosh is a 51 y.o. female.  HPI    Interim history:   Amanda Mcintosh is a 51 year old right-handed woman with an underlying medical history of reflux disease, vitamin D deficiency, prior smoking, headaches, dizziness, seasonal allergies, chronic sinusitis, hypertension, and morbid obesity with a BMI of over 40, who presents for evaluation of a new problem of recurrent headaches.  The patient is referred by her nurse practitioner, Caleen Jobs, and I reviewed the office visit note from 04/09/2022.  The patient has been followed in this clinic for her sleep apnea for the past 3 years, she recently missed an appointment in March 2023.  She reports recurrent headaches for the past 2 or 3 months.  She has a dull, achy, tightness like headache in the frontal areas bilaterally, some nausea occasionally, typically no vomiting.  She has had intermittent blurry vision.  She had a recent eye examination about 2 to 3 weeks ago and sees dry eyes.  She received new prescription reading glasses but when she tried them on she cannot read with them.  She has an appointment to go back today to discuss further.  She does report having had a visual field test and eye background check and was not told that there was any problem.  She is advised to asked them for records to be sent to [place].    She has been compliant with her AutoPap but admits that she did not take it on a trip recently.  Unfortunately, she had several deaths in the family and needed to abruptly go to [place].  In fact, she is traveling back to [place] and will stay there until the end of July.  She is planning to take her AutoPap machine.  I reviewed her compliance data from 05/21/2022 through 06/19/2022, which is a total of 30 days, during which time she used her machine 24 days with percent use days greater than 4 hours at 80%, indicating good compliance, average usage of 6 hours and 44 minutes, residual AHI 0.4/h, at  goal, pressure for the 95th percentile at 13.7 cm high side with the 95th percentile at 34.3 L/min, pressure range of 7 to 14 cm with EPR.  She is trying to work on weight loss.  She does not drink any caffeine on a daily basis, no alcohol, quit smoking.  She in fact has an appointment with bariatric surgery coming up in the next couple of months and also an appointment with medical weight management in the near future, in the next month or so.  She admits that she has not taken her blood pressure medication in the past 3 weeks because she felt dizzy and lightheaded on her blood pressure medication, lately, she has had intermittent chest pressure and anxiety.  Her blood pressure is elevated today.  She quit taking her valsartan-hydrochlorothiazide.  She was on a different medication in the past that she seemed to tolerate better.  She has also stopped taking her metformin which was prescribed for weight loss or prediabetes, not for diabetes.  She finished antibiotics for her sinus infection.  She tracks her blood pressure at home on an app and it has been in the 130s to 150s over 80s to 90s.  She does report stress, particularly at work.  The patient has a history of recent upper respiratory infection and persistent cough, she presented to the emergency room on 03/15/22 with a cough and symptoms of acute respiratory  infection.  She presented to the emergency room again on 04/03/2022 with headaches and dizziness and persistent coughing.  She had a head CT without contrast on 04/03/2022 and I reviewed the results: IMPRESSION: No evidence of acute intracranial abnormality.   Partially empty sella turcica. While this finding often reflects incidental anatomic variation, it can also be associated with idiopathic intracranial hypertension (pseudotumor cerebri).   Paranasal sinus disease, as described.  The patient has a longstanding history of dizziness.  She recently saw ENT through [facility] on 04/10/2022 and I reviewed the visit note.  She was on for sinusitis.  Excessive cerumen was removed from both ear canals at the time of her visit with Jolene Provost, PA.  Advised to start clindamycin for 10 days for chronic pansinusitis.  Her nocturnal cough was deemed secondary to postnasal drip versus reflux.  She was advised to take her PPI daily.  She had a head CT without contrast and CT angiogram of the head in the remote past on 05/29/2001 with indication of severe headaches.  Family history of intracranial aneurysm.  I reviewed the results: IMPRESSION  MILD INFLAMMATORY LEFT ETHMOID SINUS DISEASE. NO ACUTE INTRACRANIAL FINDINGS.  CT ANGIOGRAPHY OF THE HEAD:   IMPRESSION  NO EVIDENCE FOR INTRACRANIAL ANEURYSM.  She had a brain MRI with and without contrast on 02/18/2013 with indication of posterior headache, disequilibrium, vagal symptoms, query multiple sclerosis.  I reviewed the results: Impression: Normal MRI appearance of the brain.  She had a HST on 03/18/19 which demonstrated home sleep test demonstrates severe obstructive sleep apnea with a total AHI of 39.8/hour and O2 nadir of 81%.   Addendum, 07/09/2022: I received patient's visit notes from [facility], patient was seen on 05/16/2022 by Dr. Willow Ora.  Posterior segment examination showed benign findings, vitreous optic nerve appearance healthy bilaterally, nerve fiber layer healthy bilaterally, macular clear bilaterally, vessels normal bilaterally, assessment:  hyperopia, astigmatism, presbyopia.  The patient's allergies, current medications, family history, past medical history, past social history, past surgical history and problem list were reviewed and updated as appropriate.   Previously (copied from previous notes for reference):    03/28/21 (ALL): <<She returns for follow up for OSA on CPAP. She has continued fairly consistent use. She does not take it with her if she goes out of town. She spent 2 weeks  with her daughter and did not take it with her. She feels that she sleeps better without CPAP but recognizes health benefits of using it. She has less headaches and does not wake feeling congested. She has recently started smoking. She has moved into a rental home that she doesn't love and her mother passed away. She is under more stress. She has a plan in place to stop smoking and increase exercise. She would like to lose weight.>>   She saw Debbora Presto, NP on 09/28/20, at which time she was adequate with her autoPAP compliance.     She saw Debbora Presto, NP on 06/27/20, at which time she was not compliant with her autoPAP and encouraged to be consistent with autoPAP.    She had a phone visit with Debbora Presto, NP on 06/24/19, at which time she was compliant with her autoPAP and doing well.   03/03/19 (SA): (She) reports snoring and excessive daytime somnolence as well as witnessed apneas per family's report. I reviewed your office note from 01/28/2019. She had a home sleep test about 4-1/2 years ago and I reviewed the  results. Test date was 09/14/2014, overall AHI was 9.2 per hour, O2 nadir was 81%. She weighed 231 pounds at the time, BMI of 38. Her Epworth sleepiness score is 7 out of 24 today, fatigue score is 18 out of 63. She's not sleeping well. She reports a bedtime of 9 PM, some difficulty falling asleep. She does turn her bedroom TV off at 9 PM. She has a rise time of 6 AM. No night to night nocturia, occasional AM HAs, does have nasal congestion. Mom had OSA.  She had T/A as a child, no asthma. See Dr. Erik Obey for sinus d/s.  She lives with her husband, she has 1 child. She works for [employer]. She smokes one pack per day, does not utilize alcohol typically and does not drink caffeine on a regular basis.  She is fasting from her church, has an appointment for FU in wt management soon. She has seen an endocrinologist. She has seen an integrative health provider. She has had stress, lost her mom 2 years ago.  Recently lost a friend.    Her Past Medical History Is Significant For: Past Medical History:  Diagnosis Date   Fibroids    Heartburn    History of headache    Hyperlipidemia    Hypertension    Obesity    Thyroid condition    Vitamin D deficiency     Her Past Surgical History Is Significant For: Past Surgical History:  Procedure Laterality Date   CHOLECYSTECTOMY  2006   ROBOT ASSISTED MYOMECTOMY  2011   TONSILLECTOMY     WISDOM TOOTH EXTRACTION      Her Family History Is Significant For: Family History  Problem Relation Age of Onset   Diabetes Mother    Hypertension Mother    Heart failure Mother    Stroke Mother    High Cholesterol Mother    Depression Mother    Anxiety disorder Mother    Sleep apnea Mother    Obesity Mother    Obstructive Sleep Apnea Mother    COPD Father        smoker   High blood pressure Father    High Cholesterol Father    Breast cancer Sister    Skin cancer Sister    HIV/AIDS Brother    Hyperlipidemia Other    Migraines Neg Hx     Her Social History Is Significant For: Social History   Socioeconomic History   Marital status: Married    Spouse name: Darryl   Number of children: 1   Years of education: Not on file   Highest education level: Not on file  Occupational History   Occupation: Architect    Employer: [employer]  Tobacco Use   Smoking status: Some Days    Packs/day: 0.30    Types: Cigarettes    Last attempt to quit: 12/01/2019    Years since quitting: 2.5   Smokeless tobacco: Never   Tobacco comments:    Working on cutting back  Substance and Sexual Activity   Alcohol use: No   Drug use: No   Sexual activity: Not on file  Other Topics Concern   Not on file  Social History Narrative   Not on file   Social Determinants of Health   Financial Resource Strain: Not on file  Food Insecurity: No Food Insecurity (06/29/2021)   Hunger Vital Sign    Worried About Running Out of Food in the Last Year:  Never true  Ran Out of Food in the Last Year: Never true  Transportation Needs: Not on file  Physical Activity: Not on file  Stress: Not on file  Social Connections: Not on file    Her Allergies Are:  Allergies  Allergen Reactions   Gadolinium Derivatives Other (See Comments)    PT STARTED SHAKING AND FELT VERY COLD INTERNALLY BUT FACE WAS VERY FLUSHED. THIS WAS A DELAYED REACTION (ABOUT 1 HOUR AFTER INJECTION). SHE TOOK BENADRYL AND HYDRATED AND SYMPTOMS SUBSIDED  :   Her Current Medications Are:  Outpatient Encounter Medications as of 06/20/2022  Medication Sig   albuterol (VENTOLIN HFA) 108 (90 Base) MCG/ACT inhaler Inhale 1-2 puffs into the lungs every 6 (six) hours as needed for wheezing or shortness of breath. (Patient taking differently: Inhale 1-2 puffs into the lungs as needed for wheezing or shortness of breath.)   Ascorbic Acid (VITAMIN C) 1000 MG tablet Take 1,000 mg by mouth as needed.   cholecalciferol (VITAMIN D3) 25 MCG (1000 UT) tablet Take 1,000 Units by mouth daily.   cyclobenzaprine (FLEXERIL) 5 MG tablet Take 1 tablet (5 mg total) by mouth 3 (three) times daily as needed for muscle spasms. (Patient taking differently: Take 5 mg by mouth as needed for muscle spasms.)   fluticasone (FLONASE) 50 MCG/ACT nasal spray Place 1 spray into both nostrils in the morning and at bedtime. (Patient taking differently: Place 1 spray into both nostrils as needed.)   Multiple Vitamin (MULTIVITAMIN WITH MINERALS) TABS tablet Take 1 tablet by mouth daily.   Turmeric 500 MG TABS Take 500 mg by mouth as needed.   vitamin B-12 (CYANOCOBALAMIN) 100 MCG tablet Take 100 mcg by mouth daily.   vitamin E 1000 UNIT capsule Take 1,000 Units by mouth daily.   vitamin k 100 MCG tablet Take 100 mcg by mouth daily.   Zinc 50 MG CAPS Take by mouth.   benzonatate (TESSALON) 100 MG capsule Take 1 capsule (100 mg total) by mouth every 8 (eight) hours as needed for cough.   doxycycline (VIBRA-TABS) 100  MG tablet Take 1 tablet (100 mg total) by mouth 2 (two) times daily.   escitalopram (LEXAPRO) 10 MG tablet TAKE 1 TABLET BY MOUTH EVERY DAY (Patient not taking: Reported on 06/20/2022)   HYDROcodone bit-homatropine (HYCODAN) 5-1.5 MG/5ML syrup Take 5 mLs by mouth every 8 (eight) hours as needed for cough.   metFORMIN (GLUCOPHAGE XR) 500 MG 24 hr tablet Take 1 tablet (500 mg total) by mouth daily with breakfast.   predniSONE (STERAPRED UNI-PAK 21 TAB) 10 MG (21) TBPK tablet Take by mouth daily. Take 6 tabs by mouth daily  for 2 days, then 5 tabs for 2 days, then 4 tabs for 2 days, then 3 tabs for 2 days, 2 tabs for 2 days, then 1 tab by mouth daily for 2 days   valsartan-hydrochlorothiazide (DIOVAN-HCT) 160-25 MG tablet TAKE 1 TABLET BY MOUTH EVERY DAY (Patient not taking: Reported on 06/20/2022)   No facility-administered encounter medications on file as of 06/20/2022.  :  Review of Systems:  Out of a complete 14 point review of systems, all are reviewed and negative with the exception of these symptoms as listed below:   Review of Systems  Neurological:        Pt states she is here for CPAP follow up and migraine follow up Pt states she gets headaches all the time . Pt states in the last month she has had headaches everyday . Pt states she  wears CPAP evreynight     Objective:  Neurological Exam  Physical Exam Physical Examination:   Vitals:   06/20/22 0802  BP: (!) 151/84  Pulse: 60    General Examination: The patient is a very pleasant 51 y.o. female in no acute distress. She appears well-developed and well-nourished and well groomed.   HEENT: Normocephalic, atraumatic, pupils are equal, round and reactive to light. Extraocular tracking is good without limitation to gaze excursion or nystagmus noted. Normal smooth pursuit is noted.  Funduscopic exam looks benign.  No obvious evidence of papilledema.  Hearing is grossly intact. Face is symmetric with normal facial animation and normal  facial sensation. Speech is clear with no dysarthria noted. There is no hypophonia. There is no lip, neck/head, jaw or voice tremor. Neck is supple with full range of passive and active motion. There are no carotid bruits on auscultation. Oropharynx exam reveals: mild mouth dryness, good dental hygiene and mild airway crowding, due to smaller airway entry and thicker soft palate. Tonsils absent.    Chest: Clear to auscultation without wheezing, rhonchi or crackles noted.   Heart: S1+S2+0, regular and normal without murmurs, rubs or gallops noted.    Abdomen: Soft, non-tender and non-distended with normal bowel sounds appreciated on auscultation.   Extremities: There is no pitting edema in the distal lower extremities bilaterally.    Skin: Warm and dry without trophic changes noted.   Musculoskeletal: exam reveals no obvious joint deformities, tenderness or joint swelling or erythema.    Neurologically:  Mental status: The patient is awake, alert and oriented in all 4 spheres. Her immediate and remote memory, attention, language skills and fund of knowledge are appropriate. There is no evidence of aphasia, agnosia, apraxia or anomia. Speech is clear with normal prosody and enunciation. Thought process is linear. Mood is normal and affect is normal.  Cranial nerves II - XII are as described above under HEENT exam.  Motor exam: Normal bulk, strength and tone is noted. There is no resting or postural tremor.  Fine motor skills and coordination: grossly intact.  Cerebellar testing: No dysmetria or intention tremor.  Sensory exam: intact to light touch.  Gait, station and balance: She stands easily. No veering to one side is noted. No leaning to one side is noted. Posture is age-appropriate and stance is narrow based. Gait shows normal stride length and normal pace. No problems turning are noted.    Assessment and Plan:  In summary, ZALAYAH PIZZUTO is a very pleasant 51 year old female with an  underlying medical history of reflux disease, vitamin D deficiency, prior smoking, headaches, dizziness, seasonal allergies, chronic sinusitis, hypertension, and morbid obesity with a BMI of over 40, who presents for evaluation of her recurrent headaches.  She describes a fairly nonmigrainous headache with frontal pressure-like sensation, contributors likely or sinus infection, blood pressure elevation, stress, sleep apnea, difficulty reading and straining on the eyes.  Recent head CT showed an empty sella, I explained this finding to her and explained to her that it is often a incidental finding.  She does not have any obvious papilledema on examination today.  Nevertheless, we will proceed with an MRI of the brain without contrast as she had a contrast reaction in the past.  I will also order an MR venogram to make sure there is no evidence of venous sinus stenosis.  She is advised to talk to her eye doctor to make sure that she did have a proper background check and visual  field check.  She is advised to be consistent with her AutoPap and not skip any nights.  She is advised to make an appointment with her primary care nurse practitioner to discuss blood pressure management as she has not taken her blood pressure medication in about 3 weeks.  She is also encouraged to continue to strive for weight loss, she has an appointment with medical weight management as well as bariatrics as I understand.  She has a history of recurrent sinus infections and has recently seen ENT but also has an appointment with pulmonology.  She is advised to stay well-hydrated and well rested and follow-up in this clinic in 3 months, sooner if needed. I spent 50 minutes in total face-to-face time and in reviewing records during pre-charting, more than 50% of which was spent in counseling and coordination of care, reviewing test results, reviewing medications and treatment regimen and/or in discussing or reviewing the diagnosis of  recurrent HAs, OSA, the prognosis and treatment options. Pertinent laboratory and imaging test results that were available during this visit with the patient were reviewed by me and considered in my medical decision making (see chart for details).

## 2022-06-20 NOTE — Telephone Encounter (Signed)
[employer] sent to GI they obtain auth 

## 2022-06-22 ENCOUNTER — Encounter: Payer: Self-pay | Admitting: Family Medicine

## 2022-06-22 ENCOUNTER — Ambulatory Visit (INDEPENDENT_AMBULATORY_CARE_PROVIDER_SITE_OTHER): Payer: No Typology Code available for payment source | Admitting: Family Medicine

## 2022-06-22 VITALS — BP 116/67 | HR 90 | Ht 65.0 in | Wt 266.8 lb

## 2022-06-22 DIAGNOSIS — F419 Anxiety disorder, unspecified: Secondary | ICD-10-CM

## 2022-06-22 DIAGNOSIS — R0789 Other chest pain: Secondary | ICD-10-CM | POA: Diagnosis not present

## 2022-06-22 DIAGNOSIS — F32A Depression, unspecified: Secondary | ICD-10-CM | POA: Diagnosis not present

## 2022-06-22 DIAGNOSIS — I10 Essential (primary) hypertension: Secondary | ICD-10-CM

## 2022-06-22 MED ORDER — ESCITALOPRAM OXALATE 10 MG PO TABS: 10.0000 mg | ORAL_TABLET | Freq: Every day | ORAL | 1 refills | Status: DC

## 2022-07-18 ENCOUNTER — Ambulatory Visit: Payer: No Typology Code available for payment source | Admitting: Family Medicine

## 2022-07-24 ENCOUNTER — Ambulatory Visit (INDEPENDENT_AMBULATORY_CARE_PROVIDER_SITE_OTHER): Payer: No Typology Code available for payment source | Admitting: Bariatrics

## 2022-07-25 ENCOUNTER — Ambulatory Visit
Admission: RE | Admit: 2022-07-25 | Discharge: 2022-07-25 | Disposition: A | Discharge status: Home / Self Care | Payer: No Typology Code available for payment source | Source: Ambulatory Visit | Attending: Neurology | Admitting: Neurology

## 2022-07-25 DIAGNOSIS — R030 Elevated blood-pressure reading, without diagnosis of hypertension: Secondary | ICD-10-CM

## 2022-07-25 DIAGNOSIS — G4733 Obstructive sleep apnea (adult) (pediatric): Secondary | ICD-10-CM

## 2022-07-25 DIAGNOSIS — R519 Headache, unspecified: Secondary | ICD-10-CM

## 2022-07-25 DIAGNOSIS — G441 Vascular headache, not elsewhere classified: Secondary | ICD-10-CM

## 2022-07-26 ENCOUNTER — Ambulatory Visit (INDEPENDENT_AMBULATORY_CARE_PROVIDER_SITE_OTHER): Payer: No Typology Code available for payment source | Admitting: Pulmonary Disease

## 2022-07-26 ENCOUNTER — Telehealth: Payer: Self-pay | Admitting: *Deleted

## 2022-07-26 ENCOUNTER — Encounter: Payer: Self-pay | Admitting: Pulmonary Disease

## 2022-07-26 VITALS — Ht 65.0 in | Wt 268.0 lb

## 2022-07-26 DIAGNOSIS — R0609 Other forms of dyspnea: Secondary | ICD-10-CM | POA: Diagnosis not present

## 2022-07-26 NOTE — Patient Instructions (Signed)
Nice to meet you  To further investigate shortness of breath and chest tightness or pressure, I recommend pulmonary function test.  We will schedule this at your convenience next available.  For now, use albuterol 2 puffs as needed for the chest discomfort or shortness of breath.  See if this helps more than the 1 puff.  Based on the results of the pulmonary function test I will make further recommendations on medication changes if needed.  Return to clinic in 6 weeks or sooner if needed with Dr. Silas Flood

## 2022-07-26 NOTE — Telephone Encounter (Signed)
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  She can keep her follow-up as scheduled with [name] Lomax.

## 2022-07-26 NOTE — Telephone Encounter (Signed)
LVM for patient to call back to discuss results of sleep study . 07/26/2022

## 2022-07-27 ENCOUNTER — Ambulatory Visit (INDEPENDENT_AMBULATORY_CARE_PROVIDER_SITE_OTHER): Payer: No Typology Code available for payment source | Admitting: Cardiology

## 2022-07-27 ENCOUNTER — Encounter: Payer: Self-pay | Admitting: Cardiology

## 2022-07-27 VITALS — BP 122/76 | HR 56 | Ht 65.0 in | Wt 267.0 lb

## 2022-07-27 DIAGNOSIS — E7849 Other hyperlipidemia: Secondary | ICD-10-CM | POA: Insufficient documentation

## 2022-07-27 DIAGNOSIS — Z8249 Family history of ischemic heart disease and other diseases of the circulatory system: Secondary | ICD-10-CM | POA: Diagnosis not present

## 2022-07-27 DIAGNOSIS — G4733 Obstructive sleep apnea (adult) (pediatric): Secondary | ICD-10-CM

## 2022-07-27 DIAGNOSIS — I10 Essential (primary) hypertension: Secondary | ICD-10-CM | POA: Insufficient documentation

## 2022-07-27 DIAGNOSIS — F17200 Nicotine dependence, unspecified, uncomplicated: Secondary | ICD-10-CM

## 2022-07-27 DIAGNOSIS — R002 Palpitations: Secondary | ICD-10-CM | POA: Insufficient documentation

## 2022-07-27 DIAGNOSIS — Z6838 Body mass index (BMI) 38.0-38.9, adult: Secondary | ICD-10-CM

## 2022-07-27 DIAGNOSIS — E8881 Metabolic syndrome: Secondary | ICD-10-CM | POA: Insufficient documentation

## 2022-07-27 DIAGNOSIS — I152 Hypertension secondary to endocrine disorders: Secondary | ICD-10-CM | POA: Insufficient documentation

## 2022-07-27 DIAGNOSIS — R072 Precordial pain: Secondary | ICD-10-CM | POA: Diagnosis not present

## 2022-07-27 DIAGNOSIS — R0609 Other forms of dyspnea: Secondary | ICD-10-CM | POA: Diagnosis not present

## 2022-07-27 DIAGNOSIS — Z9989 Dependence on other enabling machines and devices: Secondary | ICD-10-CM

## 2022-07-27 NOTE — Patient Instructions (Addendum)
Medication Instructions:   Not needed *If you need a refill on your cardiac medications before your next appointment, please call your pharmacy*   Lab Work: Not needed    Testing/Procedures: Will be schedule  at [facility] has requested that you have an echocardiogram. Echocardiography is a painless test that uses sound waves to create images of your heart. It provides your doctor with information about the size and shape of your heart and how well your heart's chambers and valves are working. This procedure takes approximately one hour. There are no restrictions for this procedure.    Follow-Up: At [facility], you and your health needs are our priority.  As part of our continuing mission to provide you with exceptional heart care, we have created designated Provider Care Teams.  These Care Teams include your primary Cardiologist (physician) and Advanced Practice Providers (APPs -  Physician Assistants and Nurse Practitioners) who all work together to provide you with the care you need, when you need it.     Your next appointment:   3 month(s)  The format for your next appointment:   In Person  Provider:   Glenetta Hew, MD    Other Instructions   Can purchase a  fitbit  [email or web address] [email or web address]        FDA-cleared, clinical grade mobile EKG monitor: Amanda Mcintosh is the most clinically-validated mobile EKG used by the world's leading cardiac care medical professionals With Basic service, know instantly if your heart rhythm is normal or if atrial fibrillation is detected, and email the last single EKG recording to yourself or your doctor Premium service, available for purchase through the Kardia app for $9.99 per month or $99 per year, includes unlimited history and storage of your EKG recordings, a monthly EKG summary report to share with your doctor, along with the ability to track your blood  pressure, activity and weight Includes one KardiaMobile phone clip FREE SHIPPING: Standard delivery 1-3 business days. Orders placed by 11:00am PST will ship that afternoon. Otherwise, will ship next business day. All orders ship via [employer] from [place], [place] - sending an EKG Download app and set up profile. Run EKG - by placing 1-2 fingers on the silver plates After EKG is complete - Download PDF  - Skip password (if you apply a password the provider will need it to view the EKG) Click share button (square with upward arrow) in bottom left corner To send: choose MyChart (first time log into MyChart)  Pop up window about sending ECG Click continue Choose type of message Choose provider Type subject and message Click send (EKG should be attached)  - To send additional EKGs in one message click the paperclip image and bottom of page to attach.       ZIO XT- Long Term Monitor Instructions  Your physician has requested you wear a ZIO patch monitor for 14 days.  This is a single patch monitor. [employer] supplies one patch monitor per enrollment. Additional stickers are not available. Please do not apply patch if you will be having a Nuclear Stress Test,  Echocardiogram, Cardiac CT, MRI, or Chest Xray during the period you would be wearing the  monitor. The patch cannot be worn during these tests. You cannot remove and re-apply the  ZIO XT patch monitor.  Your ZIO patch monitor will be mailed 3 day USPS to your address on file. It may take 3-5 days  to receive your monitor after you have been enrolled.  Once you have received your monitor, please review the enclosed instructions. Your monitor  has already been registered assigning a specific monitor serial # to you.  Billing and Patient Assistance Program Information  We have supplied [employer] with any of your insurance information on file for billing purposes. [employer] offers a sliding scale Patient Assistance  Program for patients that do not have  insurance, or whose insurance does not completely cover the cost of the ZIO monitor.  You must apply for the Patient Assistance Program to qualify for this discounted rate.  To apply, please call [employer] at [phone], select option 4, select option 2, ask to apply for  Patient Assistance Program. Amanda Mcintosh will ask your household income, and how many people  are in your household. They will quote your out-of-pocket cost based on that information.  [employer] will also be able to set up a 54-month interest-free payment plan if needed.  Applying the monitor   Shave hair from upper left chest.  Hold abrader disc by orange tab. Rub abrader in 40 strokes over the upper left chest as  indicated in your monitor instructions.  Clean area with 4 enclosed alcohol pads. Let dry.  Apply patch as indicated in monitor instructions. Patch will be placed under collarbone on left  side of chest with arrow pointing upward.  Rub patch adhesive wings for 2 minutes. Remove white label marked "1". Remove the white  label marked "2". Rub patch adhesive wings for 2 additional minutes.  While looking in a mirror, press and release button in center of patch. A small green light will  flash 3-4 times. This will be your only indicator that the monitor has been turned on.  Do not shower for the first 24 hours. You may shower after the first 24 hours.  Press the button if you feel a symptom. You will hear a small click. Record Date, Time and  Symptom in the Patient Logbook.  When you are ready to remove the patch, follow instructions on the last 2 pages of Patient  Logbook. Stick patch monitor onto the last page of Patient Logbook.  Place Patient Logbook in the blue and white box. Use locking tab on box and tape box closed  securely. The blue and white box has prepaid postage on it. Please place it in the mailbox as  soon as possible. Your physician should have your test results  approximately 7 days after the  monitor has been [facility]  Call [place]at [phone] you have questions regarding  your ZIO XT patch monitor. Call them immediately if you see an orange light blinking on your  monitor.  If your monitor falls off in less than 4 days, contact our Monitor department at [phone]  If your monitor becomes loose or falls off after 4 days call [employer] at [phone]  suggestions on securing your monitor

## 2022-07-27 NOTE — Progress Notes (Unsigned)
Primary Care Provider: Marrian Mcintosh, [place] Cardiologist: None Electrophysiologist: None  Clinic Note: No chief complaint on file.  ===================================  ASSESSMENT/PLAN   Problem List Items Addressed This Visit       Cardiology Problems   Hyperlipidemia due to dietary fat intake   Essential hypertension (Chronic)     Other   Precordial pain   Family history of CHF (congestive heart failure) (Chronic)   Metabolic syndrome (Chronic)   Class 2 severe obesity with serious comorbidity and body mass index (BMI) of 38.0 to 38.9 in adult [facility]) - Primary   Palpitations   DOE (dyspnea on exertion) (Chronic)   Family history of early CAD (Chronic)    ===================================  HPI:    Amanda Mcintosh is a morbidly obese 51 y.o. female with history of HTN, and migraine headaches who is being seen today for the evaluation of Chest Pain and Shortness of Breath 06/22/2022 at the request of Amanda Mcintosh,*, FNP / Amanda Jobs, NP  Recent Hospitalizations:  ER visit 03/15/2022 with persistent cough-URI Follow-up ER visit for 42/3 with dizziness and cough.  Was persistent cough from the previous 2 URI symptoms.  Has been treated with antibiotics and steroids after being seen in the urgent care was also given albuterol inhaler.  Noted headache and positional dizziness.  Denied chest pain.=> Relatively benign physical examination and lab work.  CT of the head suggested possible "empty sella sign "but not otherwise consistent with pseudotumor cerebri.  Amanda Mcintosh was last seen on 06/22/2022 by Amanda Jobs, NP-ostensibly for blood pressure and anxiety follow-up.  She was seen by neurology and her blood pressure was elevated over 150/80 mmHg-and noted some fluctuating blood pressures at home as well.  Had not been taking her BP medicines for several weeks.  She had a URI with dizziness and therefore stopped all medications.  Per the  subjective portion of the note, denied any chest pain at the time, but apparently had episodes of headaches, tingling in fingers as well as chest pressure with trouble getting her breath (that resolved since restarting her blood pressure medications).  She reported a significant family cardiac history.  She was referred to cardiology because of the chest pressure  She was seen on 07/26/2022 by Amanda Mcintosh from pulmonary medicine to investigate shortness of breath and chest tightness.  Pulmonary function tests ordered.  Albuterol prescribed.   Reviewed  CV studies:    The following studies were reviewed today: (if available, images/films reviewed: From Epic Chart or Care Everywhere) ***:   Interval History:   Amanda Mcintosh   CV Review of Symptoms (Summary) Cardiovascular ROS: {roscv:310661}  REVIEWED OF SYSTEMS   ROS  I have reviewed and (if needed) personally updated the patient's problem list, medications, allergies, past medical and surgical history, social and family history.   PAST MEDICAL HISTORY   Past Medical History:  Diagnosis Date   Fibroids    Heartburn    History of headache    Hyperlipidemia    Hypertension    Obesity    Thyroid condition    Vitamin D deficiency     PAST SURGICAL HISTORY   Past Surgical History:  Procedure Laterality Date   CHOLECYSTECTOMY  2006   ROBOT ASSISTED MYOMECTOMY  2011   TONSILLECTOMY     WISDOM TOOTH EXTRACTION      Immunization History  Administered Date(s) Administered   PFIZER(Purple Top)SARS-COV-2 Vaccination 04/12/2020, 05/04/2020, 12/05/2020   PNEUMOCOCCAL CONJUGATE-20 12/12/2021  Tdap 08/31/2013    MEDICATIONS/ALLERGIES   Current Meds  Medication Sig   albuterol (VENTOLIN HFA) 108 (90 Base) MCG/ACT inhaler Inhale 1-2 puffs into the lungs every 6 (six) hours as needed for wheezing or shortness of breath. (Patient taking differently: Inhale 1-2 puffs into the lungs as needed for wheezing or shortness of  breath.)   Ascorbic Acid (VITAMIN C) 1000 MG tablet Take 1,000 mg by mouth as needed.   Blood Pressure Monitoring Soln KIT    cholecalciferol (VITAMIN D3) 25 MCG (1000 UT) tablet Take 1,000 Units by mouth daily.   cyclobenzaprine (FLEXERIL) 5 MG tablet Take 1 tablet (5 mg total) by mouth 3 (three) times daily as needed for muscle spasms. (Patient taking differently: Take 5 mg by mouth as needed for muscle spasms.)   escitalopram (LEXAPRO) 10 MG tablet Take 1 tablet (10 mg total) by mouth daily.   fluticasone (FLONASE) 50 MCG/ACT nasal spray Place 1 spray into both nostrils in the morning and at bedtime. (Patient taking differently: Place 1 spray into both nostrils as needed.)   Multiple Vitamin (MULTIVITAMIN WITH MINERALS) TABS tablet Take 1 tablet by mouth daily.   Turmeric 500 MG TABS Take 500 mg by mouth as needed.   valsartan-hydrochlorothiazide (DIOVAN-HCT) 160-25 MG tablet TAKE 1 TABLET BY MOUTH EVERY DAY   vitamin B-12 (CYANOCOBALAMIN) 100 MCG tablet Take 100 mcg by mouth daily.   vitamin E 1000 UNIT capsule Take 1,000 Units by mouth daily.   vitamin k 100 MCG tablet Take 100 mcg by mouth daily.   Zinc 50 MG CAPS Take by mouth.    Allergies  Allergen Reactions   Gadolinium Derivatives Other (See Comments)    PT STARTED SHAKING AND FELT VERY COLD INTERNALLY BUT FACE WAS VERY FLUSHED. THIS WAS A DELAYED REACTION (ABOUT 1 HOUR AFTER INJECTION). SHE TOOK BENADRYL AND HYDRATED AND SYMPTOMS SUBSIDED    SOCIAL HISTORY/FAMILY HISTORY   Reviewed in Epic:   Social History   Tobacco Use   Smoking status: Former    Packs/day: 0.30    Types: Cigarettes    Quit date: 02/27/2022    Years since quitting: 0.4    Passive exposure: Past   Smokeless tobacco: Never   Tobacco comments:    Quit in Feb 27 2022. Tay 07/26/22  Substance Use Topics   Alcohol use: No   Drug use: No   Social History   Social History Narrative   Not on file   Family History  Problem Relation Age of Onset    Diabetes Mother    Hypertension Mother    Heart failure Mother    Stroke Mother    High Cholesterol Mother    Depression Mother    Anxiety disorder Mother    Sleep apnea Mother    Obesity Mother    Obstructive Sleep Apnea Mother    Coronary artery disease Mother 9       s/p PCI   Diabetes Mellitus II Mother    Heart attack Mother 22   COPD Father        smoker   High blood pressure Father    High Cholesterol Father    Breast cancer Sister    Skin cancer Sister    HIV/AIDS Brother    Heart disease Brother 51       "enlarged heart" = presented with Acute Resp Failure   Hyperlipidemia Other    Migraines Neg Hx     OBJCTIVE -PE, EKG, labs   Wt  Readings from Last 3 Encounters:  07/27/22 267 lb (121.1 kg)  07/26/22 268 lb (121.6 kg)  06/22/22 266 lb 12.8 oz (121 kg)    Physical Exam: BP 122/76   Pulse (!) 56   Ht 5' 5" (1.651 m)   Wt 267 lb (121.1 kg)   LMP 02/28/2017   SpO2 98%   BMI 44.43 kg/m  Physical Exam   Adult ECG Report  Rate: *** ;  Rhythm: {rhythm:17366};   Narrative Interpretation: ***  Recent Labs:  ***  Lab Results  Component Value Date   CHOL 223 (H) 03/31/2021   HDL 34.70 (L) 03/31/2021   LDLCALC 156 (H) 03/31/2021   LDLDIRECT 139.0 09/06/2016   TRIG 159.0 (H) 03/31/2021   CHOLHDL 6 03/31/2021   Lab Results  Component Value Date   CREATININE 0.84 04/03/2022   BUN 13 04/03/2022   NA 138 04/03/2022   K 3.3 (L) 04/03/2022   CL 104 04/03/2022   CO2 25 04/03/2022      Latest Ref Rng & Units 04/03/2022   12:40 PM 03/02/2022   10:16 AM 12/12/2021    3:09 PM  CBC  WBC 4.0 - 10.5 K/uL 11.6  11.1  11.9   Hemoglobin 12.0 - 15.0 g/dL 13.6  12.9  12.7   Hematocrit 36.0 - 46.0 % 40.7  39.0  39.2   Platelets 150 - 400 K/uL 397  359.0  392.0     Lab Results  Component Value Date   HGBA1C 6.1 03/02/2022   Lab Results  Component Value Date   TSH 0.99 03/31/2021    ================================================== I spent a total of ***  minutes with the patient spent in direct patient consultation.  Additional time spent with chart review  / charting (studies, outside notes, etc): *** min Total Time: *** min  Current medicines are reviewed at length with the patient today.  (+/- concerns) ***  Notice: This dictation was prepared with Dragon dictation along with smart phrase technology. Any transcriptional errors that result from this process are unintentional and may not be corrected upon review.   Studies Ordered:  No orders of the defined types were placed in this encounter.  No orders of the defined types were placed in this encounter.   Patient Instructions / Medication Changes & Studies & Tests Ordered   There are no Patient Instructions on file for this visit.    Glenetta Hew, M.D., M.S. Interventional Cardiologist   Pager # [phone] Phone # [phone] [address]. [place], [place] [address]   Thank you for choosing Heartcare at [facility]!!

## 2022-07-28 ENCOUNTER — Encounter: Payer: Self-pay | Admitting: Cardiology

## 2022-07-28 NOTE — Assessment & Plan Note (Signed)
Likely multifactorial but I suspect her obesity and deconditioning are playing a major role.  Discussed potentially ordering a GXT for Coronary CTA fortunately evaluation although potentially substitute CPX for GXT  For now start with echocardiogram

## 2022-07-28 NOTE — Assessment & Plan Note (Signed)
Intermittent chest pain that happens mostly at rest.  Not necessarily reproducible on exam.   Somewhat atypical in nature, but with her family history, and poorly controlled metabolic syndrome reasonable to consider ischemic evaluation.  Plan for now check 2D echo and based on that result determine between GXT or Coronary CTA.

## 2022-07-28 NOTE — Assessment & Plan Note (Signed)
Continue to use CPAP-this will help her blood pressure and avoid pulmonary hypertension.

## 2022-07-28 NOTE — Assessment & Plan Note (Signed)
With dyspnea symptoms, plan will be to check 2D echo

## 2022-07-28 NOTE — Assessment & Plan Note (Signed)
Her BP looks good today.  Has had pretty high pressures but I think she was off her medications.  She is currently on Diovan-HCTZ at 160-25 mg daily. Resting heart rate 55, so would not consider beta-blocker.  Needs to continue to use CPAP for OSA.

## 2022-07-28 NOTE — Assessment & Plan Note (Signed)
Morbidly obese with high triglycerides, low HDL and hypertension as well as hyperglycemia/pre-DM.  Meets all 5 criteria. This in conjunction with significant family history for CAD and CHF puts her at relatively high risk.  We will wait to see what her echocardiogram shows and based on that determine whether or not we would do a GXT versus Coronary CTA.  If we would only do a GXT, would probably also be Coronary Calcium Score for risk stratification.  Plan: Check 2D echo; follow-up fasting lipid and chemistry panel as well as A1c.

## 2022-07-28 NOTE — Assessment & Plan Note (Signed)
She is having intermittent palpitations off and on.  Episodes of not really happening frequently enough to capture them on the monitor suggested potentially using a Fitbit to follow heart rate response, can also purchase a [employer] portable home monitor.  Would recommend a cardiac, would need to have to arrhythmia to potentially treat palpitations

## 2022-07-28 NOTE — Assessment & Plan Note (Signed)
She quit about 5 months ago.  Congratulated her on her efforts.

## 2022-07-28 NOTE — Assessment & Plan Note (Signed)
Labs were very poorly controlled as of April 2022.  Has not had follow-up labs since.  Is not on any medication.  She is morbidly obese and needs to lose weight, but would hope that we can get her lipids even better controlled.  Plan: Check lipids and chemistry level.  We will also check an A1c because of A1c of 6.1 in March.

## 2022-07-28 NOTE — Assessment & Plan Note (Signed)
Based on echocardiogram results, the plan will be to either proceed with GXT or Coronary CTA post evaluation. I have only GXT is done and is read as nonischemic, then would order Coronary Calcium Score for risk stratification.

## 2022-07-30 ENCOUNTER — Encounter: Payer: Self-pay | Admitting: *Deleted

## 2022-07-30 ENCOUNTER — Other Ambulatory Visit: Payer: Self-pay | Admitting: Family

## 2022-07-30 DIAGNOSIS — Z0289 Encounter for other administrative examinations: Secondary | ICD-10-CM

## 2022-07-30 NOTE — Telephone Encounter (Signed)
Called pt and discussed MRI/MRV results. 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 Her questions were answered and she verbalized appreciation. Pt will f/u as scheduled in September 08, 2022 at 9 AM.

## 2022-07-30 NOTE — Progress Notes (Signed)
$'@Patient'n$  ID: Amanda Mcintosh, female    DOB: February 07, 1971, 51 y.o.   MRN: [id number]  Chief Complaint  Patient presents with   Consult    Consult: SOB, tightness in chest    Referring provider: Marrian Salvage,*  HPI:   51 y.o. woman whom we are seeing in consultation for shortness of breath, chest pressure/tightness/discomfort.  Note from referring provider reviewed.    Patient had cough for some time.  Months to years.  Most recently seen by ENT 03/2022.  Note reviewed.  She is received antibiotics twice this year, at least 1 time of prednisone.  CT scan of this year showed pansinusitis.  Cough not a major or chief complaint today.  Still an issue.  Chief complaint today is shortness of breath, tightness in chest.  Occurs with exertion.  Intermittently.  Not consistently reproducible with exertion.  Sometimes at rest.  With time improved.  No seasonal environmental factors to identify to make things better or worse.  No position make things better or worse.  No time of day when things are better or worse.  The symptoms have been occurring for months.  Reviewed most recent chest x-rays 08/2021 with clear lungs bilaterally, and chest x-ray 02/2022 reviewed interpreted as clear lungs bilaterally in the setting of similar complaints.  She is not using any inhalers.   PMH: Tobacco abuse in remission, hypertension Surgical history: Cholecystectomy, tonsillectomy Family history: Mother with hypertension, diabetes, CVA, CHF, CAD, father with COPD, hypertension Social history: Smoker, quit early 2023, about half pack per day, lives in [place] / Pulmonary Flowsheets:   ACT:      No data to display          MMRC:     No data to display          Epworth:      No data to display          Tests:   FENO:  No results found for: "NITRICOXIDE"  PFT:    Latest Ref Rng & Units 05/04/2016    3:40 PM  PFT Results  FVC-Pre L 3.17   FVC-Predicted Pre %  101   FVC-Post L 3.07   FVC-Predicted Post % 97   Pre FEV1/FVC % % 88   Post FEV1/FCV % % 89   FEV1-Pre L 2.78   FEV1-Predicted Pre % 109   FEV1-Post L 2.73   DLCO uncorrected ml/min/mmHg 22.23   DLCO UNC% % 84   DLCO corrected ml/min/mmHg 22.81   DLCO COR %Predicted % 86   DLVA Predicted % 105   TLC L 4.55   TLC % Predicted % 86   RV % Predicted % 79   May 2017: Personally reviewed and interpreted as normal spirometry, no bronchodilator response, lung volumes within the limits, DLCO within normal limits  WALK:      No data to display          Imaging: Personally reviewed and as per EMR and discussion in this note MR MRV HEAD WO CM  Result Date: 07/26/2022 [facility] NEURO[facility] REPORT STUDY DATE: 07/25/22 PATIENT NAME: Amanda Mcintosh DOB: Jun 22, 1971 MRN: [id number] ORDERING CLINICIAN: [name] Age, MD CLINICAL HISTORY: 51 year old female with headaches and dizziness. EXAM: MR MRV HEAD WO CM TECHNIQUE: MR venogram of the head was obtained utilizing 2D-TOF sequences from the skull base to the vertex.  Computerized reconstructions were obtained. CONTRAST: none COMPARISON: none IMAGING SITE: [place]  [facility] AT [place] FINDINGS: This study is of adequate technical quality.  Flow signal of the superior sagittal sinus has no focal stenosis, but is slightly less prominent anteriorly. The right transverse and sigmoid sinuses and right internal jugular vein have no stenosis. The left transverse and sigmoid sinuses and left internal jugular vein are hypoplastic.  The deep cerebral veins, great vein of Galen and straight sinus have no stenosis.   MRV head (without) demonstrating: - Hypoplastic left transverse and sigmoid sinuses.  - Superior sagittal sinus has slightly less flow signal anteriorly, may be due to hypoplasia or technical factors. INTERPRETING PHYSICIAN: Penni Bombard, MD Certified in Neurology, Neurophysiology  and Neuroimaging [facility] [address], [place], [place] [address] [phone]  MR BRAIN WO CONTRAST  Result Date: 07/26/2022 [facility] NEURO[facility] REPORT STUDY DATE: 07/25/22 PATIENT NAME: Amanda Mcintosh DOB: Mar 22, 1971 MRN: [id number] ORDERING CLINICIAN: [name] Age, MD CLINICAL HISTORY: 51 year old female with dizziness and headaches. EXAM: MR BRAIN WO CONTRAST TECHNIQUE: MRI of the brain with and without contrast was obtained utilizing multiplanar, multiecho pulse sequences. CONTRAST: none COMPARISON: 02/18/13 MRI, 04/03/22 CT IMAGING SITE: [place] [facility] [place] FINDINGS: No abnormal lesions are seen on diffusion-weighted views to suggest acute ischemia. The cortical sulci, fissures and cisterns are normal in size and appearance. Lateral, third and fourth ventricle are normal in size and appearance. No extra-axial fluid collections are seen. No evidence of mass effect or midline shift.  No abnormal lesions are seen on post contrast views.  On sagittal views the posterior fossa, pituitary gland and corpus callosum are notable for enlarged partially empty sella. No evidence of intracranial hemorrhage on SWI views. The orbits and their contents, paranasal sinuses and calvarium are unremarkable.  Intracranial flow voids are present.   Normal MRI brain (without). INTERPRETING PHYSICIAN: Penni Bombard, MD Certified in Neurology, Neurophysiology and Neuroimaging [facility] [address] [place], [place] [address] [phone]   Lab Results: Reviewed CBC    Component Value Date/Time   WBC 11.6 (H) 04/03/2022 1240   RBC 4.54 04/03/2022 1240   HGB 13.6 04/03/2022 1240   HCT 40.7 04/03/2022 1240   PLT 397 04/03/2022 1240   MCV 89.6 04/03/2022 1240   MCH 30.0 04/03/2022 1240   MCHC 33.4 04/03/2022 1240   RDW 12.2 04/03/2022 1240   LYMPHSABS 3.0 03/02/2022 1016   MONOABS  0.6 03/02/2022 1016   EOSABS 0.2 03/02/2022 1016   BASOSABS 0.1 03/02/2022 1016    BMET    Component Value Date/Time   NA 138 04/03/2022 1240   K 3.3 (L) 04/03/2022 1240   CL 104 04/03/2022 1240   CO2 25 04/03/2022 1240   GLUCOSE 96 04/03/2022 1240   BUN 13 04/03/2022 1240   CREATININE 0.84 04/03/2022 1240   CALCIUM 9.4 04/03/2022 1240   GFRNONAA >60 04/03/2022 1240   GFRAA >60 02/03/2019 0013    BNP No results found for: "BNP"  ProBNP No results found for: "PROBNP"  Specialty Problems       Pulmonary Problems   Chronic seasonal allergic rhinitis   Chronic sinusitis   OSA on CPAP   DOE (dyspnea on exertion)    Allergies  Allergen Reactions   Gadolinium Derivatives Other (See Comments)    PT STARTED SHAKING AND FELT VERY COLD INTERNALLY BUT FACE WAS VERY FLUSHED. THIS WAS A DELAYED REACTION (ABOUT 1 HOUR AFTER INJECTION).  SHE TOOK BENADRYL AND HYDRATED AND SYMPTOMS SUBSIDED    Immunization History  Administered Date(s) Administered   PFIZER(Purple Top)SARS-COV-2 Vaccination 04/12/2020, 05/04/2020, 12/05/2020   PNEUMOCOCCAL CONJUGATE-20 12/12/2021   Tdap 08/31/2013    Past Medical History:  Diagnosis Date   Fibroids    Heartburn    History of headache    Hyperlipidemia    Hypertension    Obesity    Thyroid condition    Vitamin D deficiency     Tobacco History: Social History   Tobacco Use  Smoking Status Former   Packs/day: 0.30   Types: Cigarettes   Quit date: 02/27/2022   Years since quitting: 0.4   Passive exposure: Past  Smokeless Tobacco Never  Tobacco Comments   Quit in Feb 27 2022. Tay 07/26/22   Counseling given: Not Answered Tobacco comments: Quit in Feb 27 2022. Tay 07/26/22   Continue to not smoke  Outpatient Encounter Medications as of 07/26/2022  Medication Sig   albuterol (VENTOLIN HFA) 108 (90 Base) MCG/ACT inhaler Inhale 1-2 puffs into the lungs every 6 (six) hours as needed for wheezing or shortness of breath. (Patient  taking differently: Inhale 1-2 puffs into the lungs as needed for wheezing or shortness of breath.)   fluticasone (FLONASE) 50 MCG/ACT nasal spray Place 1 spray into both nostrils in the morning and at bedtime. (Patient taking differently: Place 1 spray into both nostrils as needed.)   Ascorbic Acid (VITAMIN C) 1000 MG tablet Take 1,000 mg by mouth as needed.   cholecalciferol (VITAMIN D3) 25 MCG (1000 UT) tablet Take 1,000 Units by mouth daily.   cyclobenzaprine (FLEXERIL) 5 MG tablet Take 1 tablet (5 mg total) by mouth 3 (three) times daily as needed for muscle spasms. (Patient taking differently: Take 5 mg by mouth as needed for muscle spasms.)   escitalopram (LEXAPRO) 10 MG tablet Take 1 tablet (10 mg total) by mouth daily.   Multiple Vitamin (MULTIVITAMIN WITH MINERALS) TABS tablet Take 1 tablet by mouth daily.   Turmeric 500 MG TABS Take 500 mg by mouth as needed.   valsartan-hydrochlorothiazide (DIOVAN-HCT) 160-25 MG tablet TAKE 1 TABLET BY MOUTH EVERY DAY   vitamin B-12 (CYANOCOBALAMIN) 100 MCG tablet Take 100 mcg by mouth daily.   vitamin E 1000 UNIT capsule Take 1,000 Units by mouth daily.   vitamin k 100 MCG tablet Take 100 mcg by mouth daily.   Zinc 50 MG CAPS Take by mouth.   [DISCONTINUED] metFORMIN (GLUCOPHAGE XR) 500 MG 24 hr tablet Take 1 tablet (500 mg total) by mouth daily with breakfast. (Patient not taking: Reported on 07/27/2022)   No facility-administered encounter medications on file as of 07/26/2022.     Review of Systems  Review of Systems  No orthopnea or PND.  No lower extremity swelling.  Comprehensive review of systems otherwise negative. Physical Exam  Ht '5\' 5"'$  (1.651 m)   Wt 268 lb (121.6 kg)   LMP 02/28/2017   BMI 44.60 kg/m   Wt Readings from Last 5 Encounters:  07/27/22 267 lb (121.1 kg)  07/26/22 268 lb (121.6 kg)  06/22/22 266 lb 12.8 oz (121 kg)  06/20/22 270 lb (122.5 kg)  04/09/22 261 lb (118.4 kg)    BMI Readings from Last 5 Encounters:   07/27/22 44.43 kg/m  07/26/22 44.60 kg/m  06/22/22 44.40 kg/m  06/20/22 44.93 kg/m  04/09/22 43.43 kg/m     Physical Exam General: Well-appearing, in no acute distress Eyes: EOMI, no icterus Neck: Supple, no JVP  Pulmonary: Clear, normal work of breathing Cardiovascular: Warm, no edema Abdomen: Nondistended, bowel sounds present MSK: No synovitis, no joint effusion Neuro: Normal gait, no weakness Psych: Normal mood, full affect   Assessment & Plan:   Chest tightness, discomfort, shortness of breath: Given description relatively high suspicion for asthma.  PFTs in 2017 normal.  Repeat PFTs in the coming days for further evaluation.  Chest imaging clear.  Trial albuterol for now.  Suspect would benefit from long-acting bronchodilators plus or minus ICS given symptoms of cough.  Reevaluate after PFTs.  Chronic sinusitis: Sinus congestion, postnasal drip a bit better over the last few weeks.  Encouraged ENT follow-up.   Return in about 6 weeks (around 09/06/2022).   Lanier Clam, MD 07/30/2022

## 2022-07-30 NOTE — Telephone Encounter (Signed)
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  She can keep her follow-up as scheduled with [name] Lomax.

## 2022-07-30 NOTE — Telephone Encounter (Signed)
error 

## 2022-08-01 ENCOUNTER — Ambulatory Visit (INDEPENDENT_AMBULATORY_CARE_PROVIDER_SITE_OTHER): Payer: No Typology Code available for payment source | Admitting: Family Medicine

## 2022-08-01 ENCOUNTER — Ambulatory Visit: Payer: Self-pay

## 2022-08-01 VITALS — BP 118/76 | HR 72 | Ht 65.0 in | Wt 266.4 lb

## 2022-08-01 DIAGNOSIS — M7041 Prepatellar bursitis, right knee: Secondary | ICD-10-CM | POA: Diagnosis not present

## 2022-08-01 DIAGNOSIS — M25561 Pain in right knee: Secondary | ICD-10-CM

## 2022-08-01 DIAGNOSIS — M7552 Bursitis of left shoulder: Secondary | ICD-10-CM

## 2022-08-01 MED ORDER — AMBULATORY NON FORMULARY MEDICATION: 0 refills | Status: AC

## 2022-08-01 MED ORDER — DICLOFENAC SODIUM 1 % EX GEL: 4.0000 g | Freq: Four times a day (QID) | CUTANEOUS | 11 refills | Status: AC

## 2022-08-01 MED ORDER — DICLOFENAC SODIUM 1 % EX GEL: 4.0000 g | Freq: Four times a day (QID) | CUTANEOUS | Status: AC

## 2022-08-01 NOTE — Progress Notes (Signed)
I, Peterson Lombard, LAT, ATC acting as a scribe for Lynne Leader, MD.  Amanda Mcintosh is a 51 y.o. female who presents to [facility] today for L arm and R knee pain. Pt was previously seen by Dr. Georgina Snell on 06/06/21 for chronic LBP. Today, pt c/o L arm pain ongoing for 3-4 weeks. Pt locates pain to L upper arm.   Neck pain: yes- and into L trapz Radiates: yes UE numbness/tingling: yes Aggravates: shoulder flex, abd, overhead motions Treatments tried: oil, muscle relaxer  Pt also c/o R knee pain ongoing since Saturday, 7/29 w/ no MOI. Pt locates pain to the anterior aspect of the R knee  R knee swelling: no Mechanical symptoms: no Aggravates: walking, transitioning to stand, knee flex/ext Treatments tried: oil, muscle relaxer  Dx imaging: 07/13/20 C-spine XR  01/05/20 L-spine XR  Pertinent review of systems: No fevers or chills  Relevant historical information: Anemia   Exam:  BP 118/76   Pulse 72   Ht '5\' 5"'$  (1.651 m)   Wt 266 lb 6.4 oz (120.8 kg)   LMP 02/28/2017   SpO2 97%   BMI 44.33 kg/m  General: Well Developed, well nourished, and in no acute distress.   MSK: Left shoulder: Normal-appearing Nontender. Range of motion abduction 120 degrees.  Internal rotation lumbar spine external rotation full. Strength: Abduction 4/5 with pain.  External and internal rotation are intact. Positive Hawkins and Neer's test. Yergason's and speeds test are positive. Pulses capillary fill and sensation are intact distally.    Lab and Radiology Results  Procedure: Real-time Ultrasound Guided Injection of left shoulder subacromial bursa Device: Philips Affiniti 50G Images permanently stored and available for review in PACS Ultrasound evaluation prior to injection reveals moderate subacromial bursitis and intact rotator cuff tendons. Verbal informed consent obtained.  Discussed risks and benefits of procedure. Warned about infection, bleeding,  hyperglycemia damage to structures among others. Patient expresses understanding and agreement Time-out conducted.   Noted no overlying erythema, induration, or other signs of local infection.   Skin prepped in a sterile fashion.   Local anesthesia: Topical Ethyl chloride.   With sterile technique and under real time ultrasound guidance: 40 mg of Kenalog and 2 mL of Marcaine injected into subacromial bursa. Fluid seen entering the bursa.   Completed without difficulty   Pain immediately resolved suggesting accurate placement of the medication.   Advised to call if fevers/chills, erythema, induration, drainage, or persistent bleeding.   Images permanently stored and available for review in the ultrasound unit.  Impression: Technically successful ultrasound guided injection.    Diagnostic Limited MSK Ultrasound of: Right knee Quad tendon intact normal. Patellar tendon is intact. Calcific change proximal patella tendon at origin patella. Superficial to the patellar tendon hypoechoic change consistent with prepatellar bursitis mildly. Degenerative changes medial joint line.  Normal-appearing lateral joint line. Posterior knee no Baker's cyst. Impression: Prepatellar bursitis     Assessment and Plan: 51 y.o. female with left shoulder pain thought to be due to rotator cuff tendinitis and impingement.  Plan for injection today.  Right anterior knee pain thought to be due to prepatellar bursitis.  Plan for Voltaren gel and compression sleeve.  Recheck in 6 weeks.   PDMP not reviewed this encounter. Orders Placed This Encounter  Procedures   [place] LIMITED JOINT SPACE STRUCTURES LOW RIGHT(NO LINKED CHARGES)    Order Specific Question:   Reason for Exam (SYMPTOM  OR DIAGNOSIS REQUIRED)    Answer:  right knee pain    Order Specific Question:   Preferred imaging location?    Answer:   [place]   DG Knee AP/LAT W/Sunrise Right    Standing Status:   Future     Standing Expiration Date:   09/01/2022    Order Specific Question:   Reason for Exam (SYMPTOM  OR DIAGNOSIS REQUIRED)    Answer:   right knee pain    Order Specific Question:   Preferred imaging location?    Answer:   [facility]    Order Specific Question:   Is patient pregnant?    Answer:   No   Meds ordered this encounter  Medications   diclofenac Sodium (VOLTAREN) 1 % topical gel 4 g   AMBULATORY NON FORMULARY MEDICATION    Sig: Compression knee sleeve for pre-patellar bursitis Dispense 1 pre-patellar bursitis Use as needed    Dispense:  1 Units    Refill:  0     Discussed warning signs or symptoms. Please see discharge instructions. Patient expresses understanding.   The above documentation has been reviewed and is accurate and complete Lynne Leader, M.D.

## 2022-08-01 NOTE — Addendum Note (Signed)
Addended by: Gregor Hams on: 08/01/2022 04:29 PM   Modules accepted: Orders

## 2022-08-01 NOTE — Patient Instructions (Addendum)
Thank you for coming in today.   You received an injection today. Seek immediate medical attention if the joint becomes red, extremely painful, or is oozing fluid.   Diclofenac gel script has been sent to your pharmacy  Check back in 6 weeks

## 2022-08-07 ENCOUNTER — Ambulatory Visit (INDEPENDENT_AMBULATORY_CARE_PROVIDER_SITE_OTHER): Payer: No Typology Code available for payment source | Admitting: Bariatrics

## 2022-08-08 ENCOUNTER — Encounter (INDEPENDENT_AMBULATORY_CARE_PROVIDER_SITE_OTHER): Payer: Self-pay

## 2022-08-14 ENCOUNTER — Encounter (INDEPENDENT_AMBULATORY_CARE_PROVIDER_SITE_OTHER): Payer: Self-pay | Admitting: Family Medicine

## 2022-08-14 ENCOUNTER — Ambulatory Visit (INDEPENDENT_AMBULATORY_CARE_PROVIDER_SITE_OTHER): Payer: No Typology Code available for payment source | Admitting: Family Medicine

## 2022-08-14 ENCOUNTER — Other Ambulatory Visit (INDEPENDENT_AMBULATORY_CARE_PROVIDER_SITE_OTHER): Payer: Self-pay | Admitting: Family Medicine

## 2022-08-14 VITALS — BP 117/73 | HR 58 | Temp 98.7°F | Ht 66.0 in | Wt 262.0 lb

## 2022-08-14 DIAGNOSIS — Z9989 Dependence on other enabling machines and devices: Secondary | ICD-10-CM

## 2022-08-14 DIAGNOSIS — Z1331 Encounter for screening for depression: Secondary | ICD-10-CM

## 2022-08-14 DIAGNOSIS — I10 Essential (primary) hypertension: Secondary | ICD-10-CM | POA: Diagnosis not present

## 2022-08-14 DIAGNOSIS — G4733 Obstructive sleep apnea (adult) (pediatric): Secondary | ICD-10-CM

## 2022-08-14 DIAGNOSIS — R7303 Prediabetes: Secondary | ICD-10-CM

## 2022-08-14 DIAGNOSIS — R5383 Other fatigue: Secondary | ICD-10-CM

## 2022-08-14 DIAGNOSIS — E559 Vitamin D deficiency, unspecified: Secondary | ICD-10-CM

## 2022-08-14 DIAGNOSIS — E669 Obesity, unspecified: Secondary | ICD-10-CM

## 2022-08-14 DIAGNOSIS — R0602 Shortness of breath: Secondary | ICD-10-CM

## 2022-08-14 DIAGNOSIS — Z6841 Body Mass Index (BMI) 40.0 and over, adult: Secondary | ICD-10-CM

## 2022-08-15 ENCOUNTER — Ambulatory Visit (HOSPITAL_COMMUNITY): Payer: No Typology Code available for payment source | Attending: Cardiology

## 2022-08-15 DIAGNOSIS — Z8249 Family history of ischemic heart disease and other diseases of the circulatory system: Secondary | ICD-10-CM | POA: Insufficient documentation

## 2022-08-15 DIAGNOSIS — R002 Palpitations: Secondary | ICD-10-CM | POA: Insufficient documentation

## 2022-08-15 DIAGNOSIS — R072 Precordial pain: Secondary | ICD-10-CM | POA: Diagnosis present

## 2022-08-15 DIAGNOSIS — R0609 Other forms of dyspnea: Secondary | ICD-10-CM | POA: Diagnosis not present

## 2022-08-15 HISTORY — PX: TRANSTHORACIC ECHOCARDIOGRAM: SHX275

## 2022-08-15 LAB — ECHOCARDIOGRAM COMPLETE
Area-P 1/2: 2.71 cm2
S' Lateral: 3.4 cm

## 2022-08-20 LAB — VITAMIN D, 25-HYDROXY, TOTAL: Vitamin D, 25-Hydroxy, Serum: 31 ng/mL

## 2022-08-20 LAB — TSH RFX ON ABNORMAL TO FREE T4: TSH: 0.813 u[IU]/mL (ref 0.450–4.500)

## 2022-08-20 LAB — HEMOGLOBIN A1C
Est. average glucose Bld gHb Est-mCnc: 128 mg/dL
Hgb A1c MFr Bld: 6.1 % — ABNORMAL HIGH (ref 4.8–5.6)

## 2022-08-21 NOTE — Progress Notes (Unsigned)
Chief Complaint:   OBESITY Amanda Mcintosh (MR# [id number]) is a 51 y.o. female who presents for evaluation and treatment of obesity and related comorbidities. Current BMI is Body mass index is 42.29 kg/m. Amanda Mcintosh has been struggling with her weight for many years and has been unsuccessful in either losing weight, maintaining weight loss, or reaching her healthy weight goal.  Amanda Mcintosh is currently in the action stage of change and ready to dedicate time achieving and maintaining a healthier weight. Amanda Mcintosh is interested in becoming our patient and working on intensive lifestyle modifications including (but not limited to) diet and exercise for weight loss.  Amanda Mcintosh would like to lose 70 lbs. She admits to overeating chips and drinking sweet tea. Pt tends to overeat with meals. She would like to lose weight to help her BP. Pt has been overweight since childhood. She gained weight when she moved to [place] in 2000 and is a lot more sedentary now. Never tried AOM. Pt denies sugary cravings and tends to skip meals. She would like to get back into yoga. She has a treadmill at home.  Amanda Mcintosh's habits were reviewed today and are as follows: Her family eats meals together, she thinks her family will eat healthier with her, her desired weight loss is 72 lbs, she has been heavy most of her life, she started gaining weight after age 87, her heaviest weight ever was 262 pounds, she has significant food cravings issues, she skips meals frequently, she is frequently drinking liquids with calories, and she has binge eating behaviors.  Depression Screen [name]'s Food and Mood (modified PHQ-9) score was 9.     08/14/2022    8:15 AM  Depression screen PHQ 2/9  Decreased Interest 1  Down, Depressed, Hopeless 0  PHQ - 2 Score 1  Altered sleeping 3  Tired, decreased energy 3  Change in appetite 0  Feeling bad or failure about yourself  0  Trouble concentrating 2  Moving slowly or fidgety/restless 0   Suicidal thoughts 0  PHQ-9 Score 9  Difficult doing work/chores Not difficult at all   Subjective:   1. Other fatigue Amanda Mcintosh admits to daytime somnolence and admits to waking up still tired. Patient has a history of symptoms of daytime fatigue, morning fatigue, and morning headache. Amanda Mcintosh generally gets 6 hours of sleep per night, and states that she has poor sleep quality. Snoring is present. Apneic episodes are present. Epworth Sleepiness Score is 7.  Expected BMR is 1986 and actual BMR is 1829, and less than expected.  2. SOB (shortness of breath) Amanda Mcintosh notes increasing shortness of breath with exercising and seems to be worsening over time with weight gain. She notes getting out of breath sooner with activity than she used to. This has gotten worse recently. Amanda Mcintosh denies shortness of breath at rest or orthopnea.  3. Primary hypertension BP well controlled on valsartan-HCTZ 160/25 mg daily. Amanda Mcintosh denies chest pain.  4. OSA on CPAP Pt aims for 7-8 hours of sleep with CPAP. She is compliant with CPAP.  5. Vitamin D deficiency Amanda Mcintosh is taking OTC Vit D3 1,000 IU daily. She complains of fatigue.  6. Pre-diabetes Her A1c was 6.1 on 03/02/2022. PCP tried pt on Metformin, but she felt bad on it. Pt is at risk of type 2 diabetes with BMI of 42.  Assessment/Plan:   1. Other fatigue Amanda Mcintosh does feel that her weight is causing her energy to be lower than it should be. Fatigue  may be related to obesity, depression or many other causes. Labs will be ordered, and in the meanwhile, Evalina will focus on self care including making healthy food choices, increasing physical activity and focusing on stress reduction. IC reviewed  - EKG 12-Lead  Lab/Orders today: - Vitamin D, 25-Hydroxy, Total - TSH Rfx on Abnormal to Free T4  2. SOB (shortness of breath) Amanda Mcintosh does feel that she gets out of breath more easily that she used to when she exercises. Amanda Mcintosh's shortness of breath  appears to be obesity related and exercise induced. She has agreed to work on weight loss and gradually increase exercise to treat her exercise induced shortness of breath. Will continue to monitor closely.  3. Primary hypertension Amanda Mcintosh is working on healthy weight loss and exercise to improve blood pressure control. We will watch for signs of hypotension as she continues her lifestyle modifications. Continue BP meds per PCP. Look for BP improvements with healthy dietary changes and weight loss.  Lab/Orders today: - Lipid panel  4. OSA on CPAP Intensive lifestyle modifications are the first line treatment for this issue. We discussed several lifestyle modifications today and she will continue to work on diet, exercise and weight loss efforts. We will continue to monitor. Orders and follow up as documented in patient record. Continue CPAP and 8 hours of sleep nightly.  5. Vitamin D deficiency Low Vitamin D level contributes to fatigue and are associated with obesity, breast, and colon cancer. She agrees to continue to take prescription Vitamin D3 1,000 IU daily and will follow-up for routine testing of Vitamin D, at least 2-3 times per year to avoid over-replacement.  Lab/Orders today: - Vitamin D, 25-Hydroxy, Total  6. Pre-diabetes Amanda Mcintosh will continue to work on weight loss, exercise, and decreasing simple carbohydrates to help decrease the risk of diabetes.  Consider the use of GLP-1 receptor agonist next OV. Reduce intake of sugar and add in more consistent physical activity.  Lab/Orders today: - Hemoglobin A1c - Insulin, random  7. Depression screening [name] had a positive depression screening. Depression is commonly associated with obesity and often results in emotional eating behaviors. We will monitor this closely and work on CBT to help improve the non-hunger eating patterns. Referral to Psychology may be required if no improvement is seen as she continues in our clinic.  8.  Obesity, current BMI 86.7 Amanda Mcintosh is currently in the action stage of change and her goal is to continue with weight loss efforts. I recommend Amanda Mcintosh begin the structured treatment plan as follows:  She has agreed to the Category 2 Plan.  Exercise goals:  Consider gym options/yoga.    Behavioral modification strategies: increasing lean protein intake, increasing water intake, decreasing eating out, no skipping meals, better snacking choices, and decreasing junk food.  She was informed of the importance of frequent follow-up visits to maximize her success with intensive lifestyle modifications for her multiple health conditions. She was informed we would discuss her lab results at her next visit unless there is a critical issue that needs to be addressed sooner. Lyda agreed to keep her next visit at the agreed upon time to discuss these results.  Objective:   Blood pressure 117/73, pulse (!) 58, temperature 98.7 F (37.1 C), height '5\' 6"'$  (1.676 m), weight 262 lb (118.8 kg), last menstrual period 02/28/2017, SpO2 98 %. Body mass index is 42.29 kg/m.  EKG: Normal sinus rhythm, rate 58.  Indirect Calorimeter completed today shows a VO2 of 266 and a REE  of [place].  Her calculated basal metabolic rate is 5859 thus her basal metabolic rate is worse than expected.  General: Cooperative, alert, well developed, in no acute distress. HEENT: Conjunctivae and lids unremarkable. Cardiovascular: Regular rhythm.  Lungs: Normal work of breathing. Neurologic: No focal deficits.   Lab Results  Component Value Date   CREATININE 0.84 04/03/2022   BUN 13 04/03/2022   NA 138 04/03/2022   K 3.3 (L) 04/03/2022   CL 104 04/03/2022   CO2 25 04/03/2022   Lab Results  Component Value Date   ALT 19 03/02/2022   AST 12 03/02/2022   ALKPHOS 68 03/02/2022   BILITOT 0.5 03/02/2022   Lab Results  Component Value Date   HGBA1C 6.1 (H) 08/14/2022   HGBA1C 6.1 03/02/2022   HGBA1C 6.1 12/12/2021   HGBA1C  6.0 03/31/2021   HGBA1C 5.6 04/05/2020   Lab Results  Component Value Date   INSULIN 19.5 02/17/2019   Lab Results  Component Value Date   TSH 0.813 08/14/2022   Lab Results  Component Value Date   CHOL 223 (H) 03/31/2021   HDL 34.70 (L) 03/31/2021   LDLCALC 156 (H) 03/31/2021   LDLDIRECT 139.0 09/06/2016   TRIG 159.0 (H) 03/31/2021   CHOLHDL 6 03/31/2021   Lab Results  Component Value Date   WBC 11.6 (H) 04/03/2022   HGB 13.6 04/03/2022   HCT 40.7 04/03/2022   MCV 89.6 04/03/2022   PLT 397 04/03/2022   Lab Results  Component Value Date   FERRITIN 83.9 12/02/2013   Attestation Statements:   Reviewed by clinician on day of visit: allergies, medications, problem list, medical history, surgical history, family history, social history, and previous encounter notes.  I, Kathlene November, BS, CMA, am acting as transcriptionist for [employer], DO.  I have reviewed the above documentation for accuracy and completeness, and I agree with the above. Dell Ponto, DO

## 2022-08-28 ENCOUNTER — Ambulatory Visit (INDEPENDENT_AMBULATORY_CARE_PROVIDER_SITE_OTHER): Payer: No Typology Code available for payment source | Admitting: Family Medicine

## 2022-08-28 ENCOUNTER — Encounter (INDEPENDENT_AMBULATORY_CARE_PROVIDER_SITE_OTHER): Payer: Self-pay | Admitting: Family Medicine

## 2022-08-28 VITALS — BP 104/68 | HR 75 | Temp 97.8°F | Ht 66.0 in | Wt 263.0 lb

## 2022-08-28 DIAGNOSIS — E669 Obesity, unspecified: Secondary | ICD-10-CM

## 2022-08-28 DIAGNOSIS — E559 Vitamin D deficiency, unspecified: Secondary | ICD-10-CM | POA: Diagnosis not present

## 2022-08-28 DIAGNOSIS — F3289 Other specified depressive episodes: Secondary | ICD-10-CM | POA: Diagnosis not present

## 2022-08-28 DIAGNOSIS — R7303 Prediabetes: Secondary | ICD-10-CM

## 2022-08-28 DIAGNOSIS — F32A Depression, unspecified: Secondary | ICD-10-CM | POA: Insufficient documentation

## 2022-08-28 DIAGNOSIS — E8881 Metabolic syndrome: Secondary | ICD-10-CM

## 2022-08-28 DIAGNOSIS — Z6841 Body Mass Index (BMI) 40.0 and over, adult: Secondary | ICD-10-CM

## 2022-08-28 MED ORDER — VITAMIN D (ERGOCALCIFEROL) 1.25 MG (50000 UNIT) PO CAPS: 50000.0000 [IU] | ORAL_CAPSULE | ORAL | 0 refills | Status: DC

## 2022-08-28 MED ORDER — RYBELSUS 3 MG PO TABS: 3.0000 mg | ORAL_TABLET | Freq: Every day | ORAL | 0 refills | Status: DC

## 2022-09-10 NOTE — Progress Notes (Unsigned)
Chief Complaint:   OBESITY Amanda Mcintosh is here to discuss her progress with her obesity treatment plan along with follow-up of her obesity related diagnoses. Amanda Mcintosh is on the Category 2 Plan and states she is following her eating plan approximately 40% of the time. Amanda Mcintosh states she has done yoga once for 60 minutes in the last 2 weeks.    Today's visit was #: 2 Starting weight: 262 lbs Starting date: 08/14/2022 Today's weight: 263 lbs Today's date: 08/28/2022 Total lbs lost to date: 0 Total lbs lost since last in-office visit: 0  Interim History: Amanda Mcintosh was skipping meals and stress eating due to getting laid off and her husband's cancer diagnosis.  She is cooking at home more.  Drinking more water.  She tried a yoga class.  Plans to grocery shop this week for meal plan food.  Subjective:   1. Vitamin D deficiency Amanda Mcintosh complains of fatigue, and she has been off vitamin D supplementations.  Her last vitamin D level was 31.  I discussed labs with the patient today.  2. Prediabetes Amanda Mcintosh's last N0N was 6.1, unchanged.  She continues to struggle with poor eating and adding in exercise.  I discussed labs with the patient today.  3. Insulin resistance Amanda Mcintosh's fasting insulin level not run, we will plan to recheck in 3 months.  4. Other depression Amanda Mcintosh is on Lexapro 10 mg once daily and her stress levels are high.  Assessment/Plan:   1. Vitamin D deficiency Amanda Mcintosh will begin prescription vitamin D 50,000 units once weekly, with no refills.  - Vitamin D, Ergocalciferol, (DRISDOL) 1.25 MG (50000 UNIT) CAPS capsule; Take 1 capsule (50,000 Units total) by mouth every 7 (seven) days.  Dispense: 5 capsule; Refill: 0  2. Prediabetes Amanda Mcintosh will begin Rybelsus 3 mg 1 tablet every day, with no refills for insulin resistance, prediabetes, and weight control.  - Semaglutide (RYBELSUS) 3 MG TABS; Take 3 mg by mouth daily.  Dispense: 30 tablet; Refill: 0  3. Insulin  resistance Amanda Mcintosh will begin Rybelsus 3 mg once daily as started above.  4. Other depression Amanda Mcintosh will consider adding Wellbutrin.   5. Obesity, current BMI 39.7 Amanda Mcintosh is currently in the action stage of change. As such, her goal is to continue with weight loss efforts. She has agreed to the Category 2 Plan.   Exercise goals: Checking out gym (has a pass).  Behavioral modification strategies: increasing lean protein intake, increasing vegetables, increasing water intake, decreasing eating out, no skipping meals, keeping healthy foods in the home, better snacking choices, and decreasing junk food.  Amanda Mcintosh has agreed to follow-up with our clinic in 3 weeks. She was informed of the importance of frequent follow-up visits to maximize her success with intensive lifestyle modifications for her multiple health conditions.   Objective:   Blood pressure 104/68, pulse 75, temperature 97.8 F (36.6 C), height '5\' 6"'$  (1.676 m), weight 263 lb (119.3 kg), last menstrual period 02/28/2017, SpO2 97 %. Body mass index is 42.45 kg/m.  General: Cooperative, alert, well developed, in no acute distress. HEENT: Conjunctivae and lids unremarkable. Cardiovascular: Regular rhythm.  Lungs: Normal work of breathing. Neurologic: No focal deficits.   Lab Results  Component Value Date   CREATININE 0.84 04/03/2022   BUN 13 04/03/2022   NA 138 04/03/2022   K 3.3 (L) 04/03/2022   CL 104 04/03/2022   CO2 25 04/03/2022   Lab Results  Component Value Date   ALT 19 03/02/2022   AST 12  03/02/2022   ALKPHOS 68 03/02/2022   BILITOT 0.5 03/02/2022   Lab Results  Component Value Date   HGBA1C 6.1 (H) 08/14/2022   HGBA1C 6.1 03/02/2022   HGBA1C 6.1 12/12/2021   HGBA1C 6.0 03/31/2021   HGBA1C 5.6 04/05/2020   Lab Results  Component Value Date   INSULIN 19.5 02/17/2019   Lab Results  Component Value Date   TSH 0.813 08/14/2022   Lab Results  Component Value Date   CHOL 223 (H) 03/31/2021    HDL 34.70 (L) 03/31/2021   LDLCALC 156 (H) 03/31/2021   LDLDIRECT 139.0 09/06/2016   TRIG 159.0 (H) 03/31/2021   CHOLHDL 6 03/31/2021   Lab Results  Component Value Date   VD25OH 23.25 (L) 12/12/2021   VD25OH 29.11 (L) 04/05/2020   VD25OH 36.1 02/17/2019   Lab Results  Component Value Date   WBC 11.6 (H) 04/03/2022   HGB 13.6 04/03/2022   HCT 40.7 04/03/2022   MCV 89.6 04/03/2022   PLT 397 04/03/2022   Lab Results  Component Value Date   FERRITIN 83.9 12/02/2013   Attestation Statements:   Reviewed by clinician on day of visit: allergies, medications, problem list, medical history, surgical history, family history, social history, and previous encounter notes.   Wilhemena Durie, am acting as transcriptionist for Loyal Gambler, DO.  I have reviewed the above documentation for accuracy and completeness, and I agree with the above. Dell Ponto, DO

## 2022-09-11 NOTE — Patient Instructions (Addendum)
Please continue using your CPAP regularly. While your insurance requires that you use CPAP at least 4 hours each night on 70% of the nights, I recommend, that you not skip any nights and use it throughout the night if you can. Getting used to CPAP and staying with the treatment long term does take time and patience and discipline. Untreated obstructive sleep apnea when it is moderate to severe can have an adverse impact on cardiovascular health and raise her risk for heart disease, arrhythmias, hypertension, congestive heart failure, stroke and diabetes. Untreated obstructive sleep apnea causes sleep disruption, nonrestorative sleep, and sleep deprivation. This can have an impact on your day to day functioning and cause daytime sleepiness and impairment of cognitive function, memory loss, mood disturbance, and problems focussing. Using CPAP regularly can improve these symptoms.  Consider melatonin or valerian root if needed for insomnia. Hopefully less stress will help as well.   Consider [employer] or [employer]   Follow up in 1 year

## 2022-09-11 NOTE — Progress Notes (Signed)
PATIENT: Amanda Mcintosh DOB: May 23, 1971  REASON FOR VISIT: follow up HISTORY FROM: patient  Chief Complaint  Patient presents with   Follow-up    Pt in rm #1 and alone. Pt state nothing has change since the last visit. Pt state she will be changing her insurance soon and will updated [place] soon.     HISTORY OF PRESENT ILLNESS:  09/13/22 ALL:  She returns for follow up for OSA on CPAP. She continues to do well. She has adjusted to therapy and using every night. She denies concerns with machine or supplies. She was laid off from her job of 15 years. She is planning to look for a new role next year. Husband has prostate cancer.     03/28/2021 ALL: She returns for follow up for OSA on CPAP. She has continued fairly consistent use. She does not take it with her if she goes out of town. She spent 2 weeks with her daughter and did not take it with her. She feels that she sleeps better without CPAP but recognizes health benefits of using it. She has less headaches and does not wake feeling congested. She has recently started smoking. She has moved into a rental home that she doesn't love and her mother passed away. She is under more stress. She has a plan in place to stop smoking and increase exercise. She would like to lose weight.     3/87/5643 ALL:  [name] is a 51 y.o. female here today for follow up for OSA on CPAP. She is doing well. She admits there are days when she doesn't use CPAP. She went out of town this past weekend and did not use her machine. She does note benefit of using CPAP.  She wakes feeling well rested.   Compliance report dated 06/25/2020 through 09/22/2020 reveals that she used CPAP 69 of the past 90 days for compliance of 77%.  She used CPAP greater than 4 hours 64 of the past 90 days for compliance of 71%.  Average usage was 6 hours and 55 minutes.  Residual AHI was 0.6 on 7 to 14 cm of water and an EPR of three.  There was no significant leak  noted.  HISTORY: (copied from my note on 03/29/5187)  [name] is a 51 y.o. female here today for follow up for OSA on CPAP. She admits that she has not been compliant with CPAP usage recently. She has gone on several vacations and not taken her machine. She knows that she needs to use it. No other concerns. She has recently quit smoking. She is working on [employer].    Compliance report dated 03/17/2020 through 06/14/2020 reveals that she used CPAP 26 of the last 90 days for compliance of 29%.  She is CPAP greater than 4 hours 23 of the past 90 days for compliance of 26%.  Average usage on days used was 6 hours and 2 minutes.  Residual AHI was 0.7 on 7 to 14 cm of water and an EPR of 3.  There was no significant leak noted.   HISTORY: (copied from my note on 04/15/6062)   [name] is a 51 y.o. female here today for follow up of OSA on CPAP.  She reports doing very well with CPAP therapy.  She has noted that she is grinding her teeth less.  Compliance download dated 05/25/2019 through 06/23/2019 reveals that she is using her machine 28 out of the last 30  days for compliance of 93%.  28 days were used for greater than 4 hours for compliance of 93%.  Average usage was 6 hours and 1 minute.  AHI was 0.5 on 7 to 14 cm of water and an EPR level of 3.  There was no significant leak.  She returns today for evaluation.     History (copied from Dr Guadelupe Sabin note on 03/03/2019)   Dear Mickel Baas,    I saw your patient, [name], upon your kind request in my sleep clinic today for initial consultation of her sleep disorder, in particular, concern for underlying obstructive sleep apnea. The patient is unaccompanied today. As you know, Ms. Rone is a 51 year old right-handed woman with an underlying medical history of reflux disease, vitamin D deficiency, smoking, headaches, dizziness, seasonal allergies, chronic sinusitis, and morbid obesity with a BMI of over 40, who reports snoring  and excessive daytime somnolence as well as witnessed apneas per family's report. I reviewed your office note from 01/28/2019. She had a home sleep test about 4-1/2 years ago and I reviewed the results. Test date was 09/14/2014, overall AHI was 9.2 per hour, O2 nadir was 81%. She weighed 231 pounds at the time, BMI of 38. Her Epworth sleepiness score is 7 out of 24 today, fatigue score is 18 out of 63. She's not sleeping well. She reports a bedtime of 9 PM, some difficulty falling asleep. She does turn her bedroom TV off at 9 PM. She has a rise time of 6 AM. No night to night nocturia, occasional AM HAs, does have nasal congestion. Mom had OSA.  She had T/A as a child, no asthma. See Dr. Erik Obey for sinus d/s.  She lives with her husband, she has 1 child. She works for [employer]. She smokes one pack per day, does not utilize alcohol typically and does not drink caffeine on a regular basis.  She is fasting from her church, has an appointment for FU in wt management soon. She has seen an endocrinologist. She has seen an integrative health provider. She has had stress, lost her mom 2 years ago. Recently lost a friend.   REVIEW OF SYSTEMS: Out of a complete 14 system review of symptoms, the patient complains only of the following symptoms, none and all other reviewed systems are negative.  ESS: 2/24, previously 8 FSS:30  ALLERGIES: Allergies  Allergen Reactions   Gadolinium Derivatives Other (See Comments)    PT STARTED SHAKING AND FELT VERY COLD INTERNALLY BUT FACE WAS VERY FLUSHED. THIS WAS A DELAYED REACTION (ABOUT 1 HOUR AFTER INJECTION). SHE TOOK BENADRYL AND HYDRATED AND SYMPTOMS SUBSIDED    HOME MEDICATIONS: Outpatient Medications Prior to Visit  Medication Sig Dispense Refill   albuterol (VENTOLIN HFA) 108 (90 Base) MCG/ACT inhaler Inhale 1-2 puffs into the lungs every 6 (six) hours as needed for wheezing or shortness of breath. (Patient taking differently: Inhale 1-2 puffs into the lungs as  needed for wheezing or shortness of breath.) 1 each 0   AMBULATORY NON FORMULARY MEDICATION Compression knee sleeve for pre-patellar bursitis Dispense 1 pre-patellar bursitis Use as needed 1 Units 0   Ascorbic Acid (VITAMIN C) 1000 MG tablet Take 1,000 mg by mouth as needed.     Blood Pressure Monitoring Soln KIT      cholecalciferol (VITAMIN D3) 25 MCG (1000 UT) tablet Take 1,000 Units by mouth Amanda.     diclofenac Sodium (VOLTAREN) 1 % GEL Apply 4 g topically 4 (four) times Amanda. To affected joint.  100 g 11   escitalopram (LEXAPRO) 10 MG tablet Take 1 tablet (10 mg total) by mouth Amanda. 90 tablet 1   fluticasone (FLONASE) 50 MCG/ACT nasal spray Place 1 spray into both nostrils in the morning and at bedtime. (Patient taking differently: Place 1 spray into both nostrils as needed.) 9.9 mL 2   Multiple Vitamin (MULTIVITAMIN WITH MINERALS) TABS tablet Take 1 tablet by mouth Amanda.     Semaglutide (RYBELSUS) 3 MG TABS Take 3 mg by mouth Amanda. 30 tablet 0   tiZANidine (ZANAFLEX) 4 MG tablet Take 1 tablet (4 mg total) by mouth every 8 (eight) hours as needed for muscle spasms. 90 tablet 1   Turmeric 500 MG TABS Take 500 mg by mouth as needed.     valsartan-hydrochlorothiazide (DIOVAN-HCT) 160-25 MG tablet TAKE 1 TABLET BY MOUTH EVERY DAY 90 tablet 1   vitamin B-12 (CYANOCOBALAMIN) 100 MCG tablet Take 100 mcg by mouth Amanda.     Vitamin D, Ergocalciferol, (DRISDOL) 1.25 MG (50000 UNIT) CAPS capsule Take 1 capsule (50,000 Units total) by mouth every 7 (seven) days. 5 capsule 0   vitamin E 1000 UNIT capsule Take 1,000 Units by mouth Amanda.     vitamin k 100 MCG tablet Take 100 mcg by mouth Amanda.     Zinc 50 MG CAPS Take by mouth.     Facility-Administered Medications Prior to Visit  Medication Dose Route Frequency Provider Last Rate Last Admin   diclofenac Sodium (VOLTAREN) 1 % topical gel 4 g  4 g Topical QID Gregor Hams, MD        PAST MEDICAL HISTORY: Past Medical History:  Diagnosis  Date   Fibroids    Heartburn    History of headache    Hyperlipidemia    Hypertension    Obesity    Thyroid condition    Vitamin D deficiency     PAST SURGICAL HISTORY: Past Surgical History:  Procedure Laterality Date   CHOLECYSTECTOMY  2006   ROBOT ASSISTED MYOMECTOMY  2011   TONSILLECTOMY     WISDOM TOOTH EXTRACTION      FAMILY HISTORY: Family History  Problem Relation Age of Onset   Diabetes Mother    Hypertension Mother    Heart failure Mother    Stroke Mother    High Cholesterol Mother    Depression Mother    Anxiety disorder Mother    Sleep apnea Mother    Obesity Mother    Obstructive Sleep Apnea Mother    Coronary artery disease Mother 48       s/p PCI   Diabetes Mellitus II Mother    Heart attack Mother 25   COPD Father        smoker   High blood pressure Father    High Cholesterol Father    Breast cancer Sister    Skin cancer Sister    HIV/AIDS Brother    Heart disease Brother 5       "enlarged heart" = presented with Acute Resp Failure   Hyperlipidemia Other    Migraines Neg Hx     SOCIAL HISTORY: Social History   Socioeconomic History   Marital status: Married    Spouse name: Darryl   Number of children: 1   Years of education: Not on file   Highest education level: Not on file  Occupational History   Occupation: Architect    Employer: [employer]  Tobacco Use   Smoking status: Former  Packs/day: 0.30    Types: Cigarettes    Quit date: 02/27/2022    Years since quitting: 0.5    Passive exposure: Past   Smokeless tobacco: Never   Tobacco comments:    Quit in Feb 27 2022. Tay 07/26/22  Substance and Sexual Activity   Alcohol use: No   Drug use: No   Sexual activity: Not on file  Other Topics Concern   Not on file  Social History Narrative   Not on file   Social Determinants of Health   Financial Resource Strain: Not on file  Food Insecurity: No Food Insecurity (06/29/2021)   Hunger Vital Sign    Worried  About Running Out of Food in the Last Year: Never true    Ran Out of Food in the Last Year: Never true  Transportation Needs: Not on file  Physical Activity: Not on file  Stress: Not on file  Social Connections: Not on file  Intimate Partner Violence: Not on file      PHYSICAL EXAM  Vitals:   09/13/22 0855  BP: 127/77  Pulse: 67  Weight: 266 lb (120.7 kg)  Height: 5' 5" (1.651 m)    Body mass index is 44.26 kg/m.  Generalized: Well developed, in no acute distress  Neurological examination  Mentation: Alert oriented to time, place, history taking. Follows all commands speech and language fluent Cranial nerve II-XII: Pupils were equal round reactive to light. Extraocular movements were full, visual field were full  Motor: The motor testing reveals 5 over 5 strength of all 4 extremities. Good symmetric motor tone is noted throughout.  Gait and station: Gait is normal.    DIAGNOSTIC DATA (LABS, IMAGING, TESTING) - I reviewed patient records, labs, notes, testing and imaging myself where available.      No data to display           Lab Results  Component Value Date   WBC 11.6 (H) 04/03/2022   HGB 13.6 04/03/2022   HCT 40.7 04/03/2022   MCV 89.6 04/03/2022   PLT 397 04/03/2022      Component Value Date/Time   NA 138 04/03/2022 1240   K 3.3 (L) 04/03/2022 1240   CL 104 04/03/2022 1240   CO2 25 04/03/2022 1240   GLUCOSE 96 04/03/2022 1240   BUN 13 04/03/2022 1240   CREATININE 0.84 04/03/2022 1240   CALCIUM 9.4 04/03/2022 1240   PROT 6.3 03/02/2022 1016   ALBUMIN 3.8 03/02/2022 1016   AST 12 03/02/2022 1016   ALT 19 03/02/2022 1016   ALKPHOS 68 03/02/2022 1016   BILITOT 0.5 03/02/2022 1016   GFRNONAA >60 04/03/2022 1240   GFRAA >60 02/03/2019 0013   Lab Results  Component Value Date   CHOL 223 (H) 03/31/2021   HDL 34.70 (L) 03/31/2021   LDLCALC 156 (H) 03/31/2021   LDLDIRECT 139.0 09/06/2016   TRIG 159.0 (H) 03/31/2021   CHOLHDL 6 03/31/2021   Lab  Results  Component Value Date   HGBA1C 6.1 (H) 08/14/2022   Lab Results  Component Value Date   VITAMINB12 846 03/02/2022   Lab Results  Component Value Date   TSH 0.813 08/14/2022       ASSESSMENT AND PLAN 51 y.o. year old female  has a past medical history of Fibroids, Heartburn, History of headache, Hyperlipidemia, Hypertension, Obesity, Thyroid condition, and Vitamin D deficiency. here with     ICD-10-CM   1. OSA on CPAP  G47.33    Z99.89  Jasmynn is doing well. Compliance report shows excellent compliance. She does recognize health benefits of using CPAP. She was encouraged to continue using nightly for at last 4 hours. She will continue working on healthy lifestyle habits. Consider melatonin or valerian root for occasional insomnia. She will continue follow up with PCP. Follow up with me in 1 year.    No orders of the defined types were placed in this encounter.    No orders of the defined types were placed in this encounter.      Debbora Presto, FNP-C 09/13/2022, 9:28 AM [facility] [address], [place], [place] [address] [phone]

## 2022-09-12 ENCOUNTER — Ambulatory Visit (INDEPENDENT_AMBULATORY_CARE_PROVIDER_SITE_OTHER): Payer: No Typology Code available for payment source | Admitting: Family Medicine

## 2022-09-12 VITALS — BP 132/80 | HR 71 | Ht 66.0 in | Wt 268.0 lb

## 2022-09-12 DIAGNOSIS — M7061 Trochanteric bursitis, right hip: Secondary | ICD-10-CM

## 2022-09-12 DIAGNOSIS — G8929 Other chronic pain: Secondary | ICD-10-CM

## 2022-09-12 DIAGNOSIS — M545 Low back pain, unspecified: Secondary | ICD-10-CM

## 2022-09-12 MED ORDER — TIZANIDINE HCL 4 MG PO TABS: 4.0000 mg | ORAL_TABLET | Freq: Three times a day (TID) | ORAL | 1 refills | Status: DC | PRN

## 2022-09-12 NOTE — Progress Notes (Signed)
   I, Peterson Lombard, LAT, ATC acting as a scribe for Lynne Leader, MD.  Amanda Mcintosh is a 51 y.o. female who presents to [place] [facility] today for f/u L shoulder and R knee pain. Pt was last seen by Dr. Georgina Snell on 08/01/22 and was given a L subacromial steroid injection and was advised to use Voltaren gel and a compression sleeve for her R knee. Today, pt reports L shoulder pain is completely resovled. Pt R knee is feeling OK and when it's painful, Voltaren gel is helpful.  Pt c/o LBP that started over a year ago. Pt locates pain to both sides of her low back and along the lateral aspect of the R hip is intermittent.   Dx imaging: 07/13/20 C-spine XR             01/05/20 L-spine XR  Pertinent review of systems: No fevers or chills  Relevant historical information: She has been laid off from her job and will lose her health insurance soon. Similar low back pain last year.  Exam:  BP 132/80   Pulse 71   Ht '5\' 6"'$  (1.676 m)   Wt 268 lb (121.6 kg)   LMP 02/28/2017   SpO2 96%   BMI 43.26 kg/m  General: Well Developed, well nourished, and in no acute distress.   MSK: L-spine: Normal appearing Nontender midline. Tender palpation lumbar paraspinal musculature bilaterally. Lower extremity strength intact bilaterally except noted below.   Right hip: Normal-appearing Tender palpation greater trochanter. Normal hip motion.  Hip abduction strength is diminished 4/5.    Lab and Radiology Results  EXAM: LUMBAR SPINE - 2-3 VIEW   COMPARISON:  01/05/2020   FINDINGS: Frontal and lateral views of the lumbar spine are obtained. There are 5 non-rib-bearing lumbar type vertebral bodies in anatomic alignment. No acute fractures. Mild facet hypertrophy at L4-5 and L5-S1 unchanged. Disc spaces are well preserved. Sacroiliac joints are normal.   IMPRESSION: 1. Stable lumbar spine.  No acute process.     Electronically Signed   By: Randa Ngo M.D.   On:  04/19/2021 11:44   I, Lynne Leader, personally (independently) visualized and performed the interpretation of the images attached in this note.      Assessment and Plan: 51 y.o. female with exacerbation of chronic low back pain and new right lateral hip pain.  Chronic bilateral low back pain thought to be more myofascial spasm and dysfunction and facet DJD related.  Plan for core strengthening.  She is doing this in physical therapy previously.  Taught a little bit of this again in clinic with ATC.  Home exercise program should be helpful here.  Limited tizanidine prescribed as well.  Right lateral hip pain thought to be trochanteric bursitis.  Home exercise program taught in clinic today by ATC.   PDMP not reviewed this encounter. No orders of the defined types were placed in this encounter.  Meds ordered this encounter  Medications   tiZANidine (ZANAFLEX) 4 MG tablet    Sig: Take 1 tablet (4 mg total) by mouth every 8 (eight) hours as needed for muscle spasms.    Dispense:  90 tablet    Refill:  1     Discussed warning signs or symptoms. Please see discharge instructions. Patient expresses understanding.   The above documentation has been reviewed and is accurate and complete Lynne Leader, M.D.

## 2022-09-12 NOTE — Patient Instructions (Addendum)
Thank you for coming in today.   Please complete the exercises that the athletic trainer went over with you:  View at [email or web address] using code: [email or web address]

## 2022-09-13 ENCOUNTER — Ambulatory Visit (INDEPENDENT_AMBULATORY_CARE_PROVIDER_SITE_OTHER): Payer: No Typology Code available for payment source | Admitting: Family Medicine

## 2022-09-13 ENCOUNTER — Encounter: Payer: Self-pay | Admitting: Family Medicine

## 2022-09-13 VITALS — BP 127/77 | HR 67 | Ht 65.0 in | Wt 266.0 lb

## 2022-09-13 DIAGNOSIS — Z9989 Dependence on other enabling machines and devices: Secondary | ICD-10-CM | POA: Diagnosis not present

## 2022-09-13 DIAGNOSIS — G4733 Obstructive sleep apnea (adult) (pediatric): Secondary | ICD-10-CM | POA: Diagnosis not present

## 2022-09-14 ENCOUNTER — Encounter: Payer: Self-pay | Admitting: *Deleted

## 2022-09-14 ENCOUNTER — Ambulatory Visit (INDEPENDENT_AMBULATORY_CARE_PROVIDER_SITE_OTHER): Payer: No Typology Code available for payment source | Admitting: Pulmonary Disease

## 2022-09-14 VITALS — BP 128/70 | HR 82 | Temp 98.2°F | Ht 66.0 in | Wt 266.0 lb

## 2022-09-14 DIAGNOSIS — J302 Other seasonal allergic rhinitis: Secondary | ICD-10-CM | POA: Diagnosis not present

## 2022-09-14 DIAGNOSIS — R0609 Other forms of dyspnea: Secondary | ICD-10-CM

## 2022-09-14 HISTORY — PX: OTHER SURGICAL HISTORY: SHX169

## 2022-09-14 LAB — PULMONARY FUNCTION TEST
DL/VA % pred: 120 %
DL/VA: 5.11 ml/min/mmHg/L
DLCO cor % pred: 91 %
DLCO cor: 19.95 ml/min/mmHg
DLCO unc % pred: 91 %
DLCO unc: 19.95 ml/min/mmHg
FEF 25-75 Post: 2.51 L/sec
FEF 25-75 Pre: 2.52 L/sec
FEF2575-%Change-Post: 0 %
FEF2575-%Pred-Post: 91 %
FEF2575-%Pred-Pre: 92 %
FEV1-%Change-Post: -3 %
FEV1-%Pred-Post: 80 %
FEV1-%Pred-Pre: 82 %
FEV1-Post: 2.31 L
FEV1-Pre: 2.39 L
FEV1FVC-%Change-Post: 2 %
FEV1FVC-%Pred-Pre: 106 %
FEV6-%Change-Post: -6 %
FEV6-%Pred-Post: 73 %
FEV6-%Pred-Pre: 78 %
FEV6-Post: 2.61 L
FEV6-Pre: 2.8 L
FEV6FVC-%Pred-Post: 102 %
FEV6FVC-%Pred-Pre: 102 %
FVC-%Change-Post: -5 %
FVC-%Pred-Post: 72 %
FVC-%Pred-Pre: 76 %
FVC-Post: 2.64 L
FVC-Pre: 2.8 L
Post FEV1/FVC ratio: 88 %
Post FEV6/FVC ratio: 100 %
Pre FEV1/FVC ratio: 85 %
Pre FEV6/FVC Ratio: 100 %
RV % pred: 43 %
RV: 0.83 L
TLC % pred: 80 %
TLC: 4.26 L

## 2022-09-14 MED ORDER — BUDESONIDE-FORMOTEROL FUMARATE 160-4.5 MCG/ACT IN AERO: 2.0000 | INHALATION_SPRAY | Freq: Two times a day (BID) | RESPIRATORY_TRACT | 3 refills | Status: DC

## 2022-09-14 NOTE — Patient Instructions (Signed)
Nice to see you again  Your pulmonary function test are largely normal  Lets try an inhaler.  I prescribed Symbicort 2 puffs twice a day.  Use it every day.  Rinse your mouth out with water after every use.  This may be more expensive based on your insurance plan.  If it is too expensive please send a message or call and let me know.  Anticipation that it may be too expensive, I provided samples of Trelegy.  Use 1 puff once a day if Symbicort is not an option.  This is a dry powder inhaler.  If you can tolerate this.  I will send in a prescription of Breo which is a similar medicine but slightly different.  You must rinse your mouth after every use of Trelegy or Breo.  Return to clinic in 3 months or sooner as needed with Dr. Silas Flood.  If you need to reschedule no worries just call us and let us know.

## 2022-09-14 NOTE — Progress Notes (Signed)
<[id number]<\/[id number]>_2  ID: Amanda Mcintosh, female    DOB: 1971-12-21, 51 y.o.   MRN: [id number]  Chief Complaint  Patient presents with   Follow-up    Pt is here for follow up for DOE. Pt had full pfts done today.     Referring provider: Marrian Salvage,*  HPI:   51 y.o. woman whom we are seeing in follow up for shortness of breath, chest pressure/tightness/discomfort.    Returns for routine follow-up.  A couple of episodes in the interim of chest pressure tightness feels like she cannot breathe.  Not sure if it is a panic attack or not.  Recent symptoms when driving.  Use albuterol does think it helps some.  She had PFTs performed today.  Fully interpreted below but largely normal .  HPI at initial visit: Patient had cough for some time.  Months to years.  Most recently seen by ENT 03/2022.  Note reviewed.  She is received antibiotics twice this year, at least 1 time of prednisone.  CT scan of this year showed pansinusitis.  Cough not a major or chief complaint today.  Still an issue.  Chief complaint today is shortness of breath, tightness in chest.  Occurs with exertion.  Intermittently.  Not consistently reproducible with exertion.  Sometimes at rest.  With time improved.  No seasonal environmental factors to identify to make things better or worse.  No position make things better or worse.  No time of day when things are better or worse.  The symptoms have been occurring for months.  Reviewed most recent chest x-rays 08/2021 with clear lungs bilaterally, and chest x-ray 02/2022 reviewed interpreted as clear lungs bilaterally in the setting of similar complaints.  She is not using any inhalers.   PMH: Tobacco abuse in remission, hypertension Surgical history: Cholecystectomy, tonsillectomy Family history: Mother with hypertension, diabetes, CVA, CHF, CAD, father with COPD, hypertension Social history: Smoker, quit early 2023, about half pack per day, lives in [place] /  Pulmonary Flowsheets:   ACT:      No data to display           MMRC:     No data to display           Epworth:      No data to display           Tests:   FENO:  No results found for: "NITRICOXIDE"  PFT:    Latest Ref Rng & Units 09/14/2022   11:52 AM 05/04/2016    3:40 PM  PFT Results  FVC-Pre L 2.80  P 3.17   FVC-Predicted Pre % 76  P 101   FVC-Post L 2.64  P 3.07   FVC-Predicted Post % 72  P 97   Pre FEV1/FVC % % 85  P 88   Post FEV1/FCV % % 88  P 89   FEV1-Pre L 2.39  P 2.78   FEV1-Predicted Pre % 82  P 109   FEV1-Post L 2.31  P 2.73   DLCO uncorrected ml/min/mmHg 19.95  P 22.23   DLCO UNC% % 91  P 84   DLCO corrected ml/min/mmHg 19.95  P 22.81   DLCO COR %Predicted % 91  P 86   DLVA Predicted % 120  P 105   TLC L 4.26  P 4.55   TLC % Predicted % 80  P 86   RV % Predicted % 43  P 79     P Preliminary  result   May 2017: Personally reviewed and interpreted as normal spirometry, no bronchodilator response, lung volumes within the limits, DLCO within normal limits  WALK:      No data to display           Imaging: Personally reviewed and as per EMR and discussion in this note No results found.  Lab Results: Reviewed CBC    Component Value Date/Time   WBC 11.6 (H) 04/03/2022 1240   RBC 4.54 04/03/2022 1240   HGB 13.6 04/03/2022 1240   HCT 40.7 04/03/2022 1240   PLT 397 04/03/2022 1240   MCV 89.6 04/03/2022 1240   MCH 30.0 04/03/2022 1240   MCHC 33.4 04/03/2022 1240   RDW 12.2 04/03/2022 1240   LYMPHSABS 3.0 03/02/2022 1016   MONOABS 0.6 03/02/2022 1016   EOSABS 0.2 03/02/2022 1016   BASOSABS 0.1 03/02/2022 1016    BMET    Component Value Date/Time   NA 138 04/03/2022 1240   K 3.3 (L) 04/03/2022 1240   CL 104 04/03/2022 1240   CO2 25 04/03/2022 1240   GLUCOSE 96 04/03/2022 1240   BUN 13 04/03/2022 1240   CREATININE 0.84 04/03/2022 1240   CALCIUM 9.4 04/03/2022 1240   GFRNONAA >60 04/03/2022 1240   GFRAA >60 02/03/2019  0013    BNP No results found for: "BNP"  ProBNP No results found for: "PROBNP"  Specialty Problems       Pulmonary Problems   Chronic seasonal allergic rhinitis   Chronic sinusitis   OSA on CPAP   DOE (dyspnea on exertion)    Allergies  Allergen Reactions   Gadolinium Derivatives Other (See Comments)    PT STARTED SHAKING AND FELT VERY COLD INTERNALLY BUT FACE WAS VERY FLUSHED. THIS WAS A DELAYED REACTION (ABOUT 1 HOUR AFTER INJECTION). SHE TOOK BENADRYL AND HYDRATED AND SYMPTOMS SUBSIDED    Immunization History  Administered Date(s) Administered   PFIZER(Purple Top)SARS-COV-2 Vaccination 04/12/2020, 05/04/2020, 12/05/2020   PNEUMOCOCCAL CONJUGATE-20 12/12/2021   Tdap 08/31/2013    Past Medical History:  Diagnosis Date   Fibroids    Heartburn    History of headache    Hyperlipidemia    Hypertension    Obesity    Thyroid condition    Vitamin D deficiency     Tobacco History: Social History   Tobacco Use  Smoking Status Former   Packs/day: 0.30   Types: Cigarettes   Quit date: 02/27/2021   Years since quitting: 1.5   Passive exposure: Past  Smokeless Tobacco Never  Tobacco Comments   Quit in Feb 27 2022. Tay 07/26/22   Counseling given: Not Answered Tobacco comments: Quit in Feb 27 2022. Tay 07/26/22   Continue to not smoke  Outpatient Encounter Medications as of 09/14/2022  Medication Sig   albuterol (VENTOLIN HFA) 108 (90 Base) MCG/ACT inhaler Inhale 1-2 puffs into the lungs every 6 (six) hours as needed for wheezing or shortness of breath. (Patient taking differently: Inhale 1-2 puffs into the lungs as needed for wheezing or shortness of breath.)   AMBULATORY NON FORMULARY MEDICATION Compression knee sleeve for pre-patellar bursitis Dispense 1 pre-patellar bursitis Use as needed   Ascorbic Acid (VITAMIN C) 1000 MG tablet Take 1,000 mg by mouth as needed.   Blood Pressure Monitoring Soln KIT    budesonide-formoterol (SYMBICORT) 160-4.5 MCG/ACT  inhaler Inhale 2 puffs into the lungs 2 (two) times daily.   cholecalciferol (VITAMIN D3) 25 MCG (1000 UT) tablet Take 1,000 Units by mouth daily.  diclofenac Sodium (VOLTAREN) 1 % GEL Apply 4 g topically 4 (four) times daily. To affected joint.   escitalopram (LEXAPRO) 10 MG tablet Take 1 tablet (10 mg total) by mouth daily.   fluticasone (FLONASE) 50 MCG/ACT nasal spray Place 1 spray into both nostrils in the morning and at bedtime. (Patient taking differently: Place 1 spray into both nostrils as needed.)   Multiple Vitamin (MULTIVITAMIN WITH MINERALS) TABS tablet Take 1 tablet by mouth daily.   Semaglutide (RYBELSUS) 3 MG TABS Take 3 mg by mouth daily.   tiZANidine (ZANAFLEX) 4 MG tablet Take 1 tablet (4 mg total) by mouth every 8 (eight) hours as needed for muscle spasms.   Turmeric 500 MG TABS Take 500 mg by mouth as needed.   valsartan-hydrochlorothiazide (DIOVAN-HCT) 160-25 MG tablet TAKE 1 TABLET BY MOUTH EVERY DAY   vitamin B-12 (CYANOCOBALAMIN) 100 MCG tablet Take 100 mcg by mouth daily.   Vitamin D, Ergocalciferol, (DRISDOL) 1.25 MG (50000 UNIT) CAPS capsule Take 1 capsule (50,000 Units total) by mouth every 7 (seven) days.   vitamin E 1000 UNIT capsule Take 1,000 Units by mouth daily.   vitamin k 100 MCG tablet Take 100 mcg by mouth daily.   Zinc 50 MG CAPS Take by mouth.   Facility-Administered Encounter Medications as of 09/14/2022  Medication   diclofenac Sodium (VOLTAREN) 1 % topical gel 4 g     Review of Systems  Review of Systems  N/a  Physical Exam  BP 128/70 (BP Location: Left Arm, Patient Position: Sitting, Cuff Size: Normal)   Pulse 82   Temp 98.2 F (36.8 C) (Oral)   Ht 5' 6" (1.676 m)   Wt 266 lb (120.7 kg)   LMP 02/28/2017   SpO2 97%   BMI 42.93 kg/m   Wt Readings from Last 5 Encounters:  09/14/22 266 lb (120.7 kg)  09/13/22 266 lb (120.7 kg)  09/12/22 268 lb (121.6 kg)  08/28/22 263 lb (119.3 kg)  08/14/22 262 lb (118.8 kg)    BMI Readings  from Last 5 Encounters:  09/14/22 42.93 kg/m  09/13/22 44.26 kg/m  09/12/22 43.26 kg/m  08/28/22 42.45 kg/m  08/14/22 42.29 kg/m     Physical Exam General: Well-appearing, in no acute distress Eyes: EOMI, no icterus Neck: Supple, no JVP Pulmonary: Clear, normal work of breathing Cardiovascular: Warm, no edema Abdomen: Nondistended, bowel sounds present MSK: No synovitis, no joint effusion Neuro: Normal gait, no weakness Psych: Normal mood, full affect   Assessment & Plan:   Chest tightness, discomfort, shortness of breath: Given description relatively high suspicion for asthma.  PFTs in 2017 normal.  Repeat PFTs 08/2022 normal.  Chest imaging clear.  Trial albuterol with some mild improvement.  High-dose ICS/LABA via Symbicort 2 puff twice daily prescribed today.  If needed Memory Dance may be cheaper based on initial information on EMR.  Chronic sinusitis: Sinus congestion, postnasal drip a bit better over the last few weeks.  Encouraged ENT follow-up.   Return in about 3 months (around 12/14/2022).   Lanier Clam, MD 09/14/2022

## 2022-09-14 NOTE — Progress Notes (Signed)
Full PFT performed today. °

## 2022-09-14 NOTE — Patient Instructions (Signed)
Full PFT performed today. °

## 2022-09-18 ENCOUNTER — Encounter: Payer: Self-pay | Admitting: Family

## 2022-09-18 ENCOUNTER — Ambulatory Visit (INDEPENDENT_AMBULATORY_CARE_PROVIDER_SITE_OTHER): Payer: No Typology Code available for payment source | Admitting: Family Medicine

## 2022-09-18 ENCOUNTER — Ambulatory Visit (INDEPENDENT_AMBULATORY_CARE_PROVIDER_SITE_OTHER): Payer: No Typology Code available for payment source | Admitting: Family

## 2022-09-18 VITALS — BP 120/70 | HR 67 | Temp 98.6°F | Ht 65.0 in | Wt 268.6 lb

## 2022-09-18 DIAGNOSIS — I10 Essential (primary) hypertension: Secondary | ICD-10-CM

## 2022-09-18 MED ORDER — VALSARTAN-HYDROCHLOROTHIAZIDE 160-25 MG PO TABS
1.0000 | ORAL_TABLET | Freq: Every day | ORAL | 1 refills | Status: DC
Start: 2022-09-18 — End: 2023-04-12

## 2022-09-18 NOTE — Progress Notes (Signed)
Amanda Mcintosh is a 51 y.o. female with the following history as recorded in [employer]:  Patient Active Problem List   Diagnosis Date Noted   Vitamin D deficiency 08/28/2022   Prediabetes 08/28/2022   Insulin resistance 08/28/2022   Depression 08/28/2022   Essential hypertension 07/27/2022   Precordial pain 07/27/2022   Family history of CHF (congestive heart failure) 07/27/2022   Palpitations 07/27/2022   DOE (dyspnea on exertion) 07/27/2022   Hyperlipidemia due to dietary fat intake 76/54/6503   Metabolic syndrome 54/65/6812   OSA on CPAP 06/24/2019   Low back pain 04/30/2019   Dizziness 07/04/2017   Chronic seasonal allergic rhinitis 01/25/2017   Chronic sinusitis 01/25/2017   Lateral epicondylitis 01/13/2016   Amenorrhea 06/28/2015   Situational syncope 05/07/2015   GERD (gastroesophageal reflux disease) 04/21/2015   Routine general medical examination at a health care facility 04/21/2015   Unspecified vitamin D deficiency 12/02/2013   Class 2 severe obesity with serious comorbidity and body mass index (BMI) of 38.0 to 38.9 in adult ([place]) 12/02/2013   Iron deficiency anemia 12/02/2013   Tobacco use disorder 12/02/2013   Migraine headache without aura 03/11/2013   Family history of early CAD 09/08/2012    Current Outpatient Medications  Medication Sig Dispense Refill   albuterol (VENTOLIN HFA) 108 (90 Base) MCG/ACT inhaler Inhale 1-2 puffs into the lungs every 6 (six) hours as needed for wheezing or shortness of breath. (Patient taking differently: Inhale 1-2 puffs into the lungs as needed for wheezing or shortness of breath.) 1 each 0   AMBULATORY NON FORMULARY MEDICATION Compression knee sleeve for pre-patellar bursitis Dispense 1 pre-patellar bursitis Use as needed 1 Units 0   Blood Pressure Monitoring Soln KIT      budesonide-formoterol (SYMBICORT) 160-4.5 MCG/ACT inhaler Inhale 2 puffs into the lungs 2 (two) times daily. 1 each 3   fluticasone (FLONASE) 50  MCG/ACT nasal spray Place 1 spray into both nostrils in the morning and at bedtime. (Patient taking differently: Place 1 spray into both nostrils as needed.) 9.9 mL 2   Multiple Vitamin (MULTIVITAMIN WITH MINERALS) TABS tablet Take 1 tablet by mouth daily.     Turmeric 500 MG TABS Take 500 mg by mouth as needed.     Vitamin D, Ergocalciferol, (DRISDOL) 1.25 MG (50000 UNIT) CAPS capsule Take 1 capsule (50,000 Units total) by mouth every 7 (seven) days. 5 capsule 0   vitamin E 1000 UNIT capsule Take 1,000 Units by mouth daily.     Ascorbic Acid (VITAMIN C) 1000 MG tablet Take 1,000 mg by mouth as needed. (Patient not taking: Reported on 09/18/2022)     cholecalciferol (VITAMIN D3) 25 MCG (1000 UT) tablet Take 1,000 Units by mouth daily. (Patient not taking: Reported on 09/18/2022)     diclofenac Sodium (VOLTAREN) 1 % GEL Apply 4 g topically 4 (four) times daily. To affected joint. (Patient not taking: Reported on 09/18/2022) 100 g 11   escitalopram (LEXAPRO) 10 MG tablet Take 1 tablet (10 mg total) by mouth daily. (Patient not taking: Reported on 09/18/2022) 90 tablet 1   Semaglutide (RYBELSUS) 3 MG TABS Take 3 mg by mouth daily. (Patient not taking: Reported on 09/18/2022) 30 tablet 0   tiZANidine (ZANAFLEX) 4 MG tablet Take 1 tablet (4 mg total) by mouth every 8 (eight) hours as needed for muscle spasms. (Patient not taking: Reported on 09/18/2022) 90 tablet 1   valsartan-hydrochlorothiazide (DIOVAN-HCT) 160-25 MG tablet Take 1 tablet by mouth daily. 90 tablet 1  vitamin B-12 (CYANOCOBALAMIN) 100 MCG tablet Take 100 mcg by mouth daily. (Patient not taking: Reported on 09/18/2022)     vitamin k 100 MCG tablet Take 100 mcg by mouth daily. (Patient not taking: Reported on 09/18/2022)     Zinc 50 MG CAPS Take by mouth. (Patient not taking: Reported on 09/18/2022)     Current Facility-Administered Medications  Medication Dose Route Frequency Provider Last Rate Last Admin   diclofenac Sodium (VOLTAREN) 1 %  topical gel 4 g  4 g Topical QID Gregor Hams, MD        Allergies: Gadolinium derivatives  Past Medical History:  Diagnosis Date   Fibroids    Heartburn    History of headache    Hyperlipidemia    Hypertension    Obesity    Thyroid condition    Vitamin D deficiency     Past Surgical History:  Procedure Laterality Date   CHOLECYSTECTOMY  2006   ROBOT ASSISTED MYOMECTOMY  2011   TONSILLECTOMY     WISDOM TOOTH EXTRACTION      Family History  Problem Relation Age of Onset   Diabetes Mother    Hypertension Mother    Heart failure Mother    Stroke Mother    High Cholesterol Mother    Depression Mother    Anxiety disorder Mother    Sleep apnea Mother    Obesity Mother    Obstructive Sleep Apnea Mother    Coronary artery disease Mother 25       s/p PCI   Diabetes Mellitus II Mother    Heart attack Mother 31   COPD Father        smoker   High blood pressure Father    High Cholesterol Father    Breast cancer Sister    Skin cancer Sister    HIV/AIDS Brother    Heart disease Brother 3       "enlarged heart" = presented with Acute Resp Failure   Hyperlipidemia Other    Migraines Neg Hx     Social History   Tobacco Use   Smoking status: Former    Packs/day: 0.30    Types: Cigarettes    Quit date: 02/27/2021    Years since quitting: 1.5    Passive exposure: Past   Smokeless tobacco: Never   Tobacco comments:    Quit in Feb 27 2022. Tay 07/26/22  Substance Use Topics   Alcohol use: No    Subjective:   Follow up on hypertension; requesting refill on medication; recently laid off- finding it to be a blessing in disguise; will be losing health insurance at the end of October and wanted to get Rx updated; Is working with [facility] [employer] [facility]- has not started Rybelsus;  Defers flu shot today- does not typically take flu shots;     Objective:  Vitals:   09/18/22 1313  BP: 120/70  Pulse: 67  Temp: 98.6 F (37 C)  TempSrc: Oral  SpO2: 98%  Weight: 268  lb 9.6 oz (121.8 kg)  Height: '5\' 5"'  (1.651 m)    General: Well developed, well nourished, in no acute distress  Skin : Warm and dry.  Head: Normocephalic and atraumatic  Eyes: Sclera and conjunctiva clear; pupils round and reactive to light; extraocular movements intact  Ears: External normal; canals clear; tympanic membranes normal  Oropharynx: Pink, supple. No suspicious lesions  Neck: Supple without thyromegaly, adenopathy  Lungs: Respirations unlabored; clear to auscultation bilaterally without wheeze, rales, rhonchi  CVS exam: normal rate and regular rhythm.  Neurologic: Alert and oriented; speech intact; face symmetrical; moves all extremities well; CNII-XII intact without focal deficit   Assessment:  1. Primary hypertension     Plan:  Stable; refill updated; follow up in 6 months, sooner prn.   No follow-ups on file.  No orders of the defined types were placed in this encounter.   Requested Prescriptions   Signed Prescriptions Disp Refills   valsartan-hydrochlorothiazide (DIOVAN-HCT) 160-25 MG tablet 90 tablet 1    Sig: Take 1 tablet by mouth daily.

## 2022-10-05 ENCOUNTER — Encounter: Payer: Self-pay | Admitting: Family

## 2022-10-05 LAB — HM MAMMOGRAPHY

## 2022-10-10 ENCOUNTER — Ambulatory Visit: Payer: No Typology Code available for payment source | Attending: Cardiology | Admitting: Cardiology

## 2022-10-10 ENCOUNTER — Telehealth (HOSPITAL_COMMUNITY): Payer: Self-pay | Admitting: *Deleted

## 2022-10-10 ENCOUNTER — Encounter: Payer: Self-pay | Admitting: Cardiology

## 2022-10-10 VITALS — BP 120/84 | HR 74 | Ht 65.0 in | Wt 269.6 lb

## 2022-10-10 DIAGNOSIS — Z8249 Family history of ischemic heart disease and other diseases of the circulatory system: Secondary | ICD-10-CM

## 2022-10-10 DIAGNOSIS — R002 Palpitations: Secondary | ICD-10-CM

## 2022-10-10 DIAGNOSIS — I10 Essential (primary) hypertension: Secondary | ICD-10-CM

## 2022-10-10 DIAGNOSIS — E7849 Other hyperlipidemia: Secondary | ICD-10-CM

## 2022-10-10 DIAGNOSIS — G4733 Obstructive sleep apnea (adult) (pediatric): Secondary | ICD-10-CM | POA: Diagnosis not present

## 2022-10-10 DIAGNOSIS — R55 Syncope and collapse: Secondary | ICD-10-CM | POA: Diagnosis not present

## 2022-10-10 DIAGNOSIS — R072 Precordial pain: Secondary | ICD-10-CM

## 2022-10-10 DIAGNOSIS — R0609 Other forms of dyspnea: Secondary | ICD-10-CM

## 2022-10-10 DIAGNOSIS — E8881 Metabolic syndrome: Secondary | ICD-10-CM

## 2022-10-10 DIAGNOSIS — Z6838 Body mass index (BMI) 38.0-38.9, adult: Secondary | ICD-10-CM

## 2022-10-10 LAB — COMPREHENSIVE METABOLIC PANEL
ALT: 21 IU/L (ref 0–32)
AST: 14 IU/L (ref 0–40)
Albumin/Globulin Ratio: 1.4 (ref 1.2–2.2)
Albumin: 4.2 g/dL (ref 3.8–4.9)
Alkaline Phosphatase: 83 IU/L (ref 44–121)
BUN/Creatinine Ratio: 15 (ref 9–23)
BUN: 14 mg/dL (ref 6–24)
Bilirubin Total: 0.4 mg/dL (ref 0.0–1.2)
CO2: 25 mmol/L (ref 20–29)
Calcium: 9.4 mg/dL (ref 8.7–10.2)
Chloride: 101 mmol/L (ref 96–106)
Creatinine, Ser: 0.92 mg/dL (ref 0.57–1.00)
Globulin, Total: 2.9 g/dL (ref 1.5–4.5)
Glucose: 120 mg/dL — ABNORMAL HIGH (ref 70–99)
Potassium: 4.2 mmol/L (ref 3.5–5.2)
Sodium: 141 mmol/L (ref 134–144)
Total Protein: 7.1 g/dL (ref 6.0–8.5)
eGFR: 74 mL/min/{1.73_m2} (ref >59)

## 2022-10-10 LAB — LIPID PANEL
Chol/HDL Ratio: 6.6 ratio — ABNORMAL HIGH (ref 0.0–4.4)
Cholesterol, Total: 252 mg/dL — ABNORMAL HIGH (ref 100–199)
HDL: 38 mg/dL — ABNORMAL LOW (ref >39)
LDL Chol Calc (NIH): 187 mg/dL — ABNORMAL HIGH (ref 0–99)
Triglycerides: 148 mg/dL (ref 0–149)
VLDL Cholesterol Cal: 27 mg/dL (ref 5–40)

## 2022-10-10 MED ORDER — METOPROLOL TARTRATE 50 MG PO TABS: 100.0000 mg | ORAL_TABLET | Freq: Once | ORAL | 0 refills | Status: DC

## 2022-10-10 NOTE — Assessment & Plan Note (Signed)
Overall, palpitations seem pretty well controlled.  She is not currently on beta-blocker but I think this is probably related to stress.  We will reassess.

## 2022-10-10 NOTE — Assessment & Plan Note (Addendum)
Not currently on any meds for lipids but her labs as of April 2022 were poorly controlled.  We would like to somewhat restratify her and get a sense as to whether her chest pain could potentially related to coronary artery disease.  Plan: Coronary CTA  Recheck lipid panel today with plans to potentially consider treatment based on Coronary CTA results.  She is actually now trying to work on weight loss and I think Rybelsus would be a great option for her.  This would potentially help her overall lipid panel.  Based on results Coronary Calcium Score/Coronary CTA, would potentially consider initiating statin.

## 2022-10-10 NOTE — Assessment & Plan Note (Signed)
Even though she is trying to work on her anxiety and stress, she still happening these episodes of intermittent chest pain that happen both with rest exertion and associated with dyspnea.  Concern is that this could potentially be anginal pain.  She has a strong family history.  Plan: Check Coronary CTA with possible for CT. We will need preprocedure labs

## 2022-10-10 NOTE — Progress Notes (Signed)
Primary Care Provider: Marrian Salvage, [facility] Cardiologist: Glenetta Hew, MD Electrophysiologist: None  Clinic Note: Chief Complaint  Patient presents with   Follow-up    Still having some chest pain but a little bit better.   ===================================  ASSESSMENT/PLAN   Problem List Items Addressed This Visit       Cardiology Problems   Essential hypertension - Primary (Chronic)    BP stable on current meds.-Valsartan-HCTZ.      Relevant Medications   metoprolol tartrate (LOPRESSOR) 50 MG tablet   Hyperlipidemia due to dietary fat intake (Chronic)    Not currently on any meds for lipids but her labs as of April 2022 were poorly controlled.  We would like to somewhat restratify her and get a sense as to whether her chest pain could potentially related to coronary artery disease.  Plan: Coronary CTA Recheck lipid panel today with plans to potentially consider treatment based on Coronary CTA results. She is actually now trying to work on weight loss and I think Rybelsus would be a great option for her.  This would potentially help her overall lipid panel. Based on results Coronary Calcium Score/Coronary CTA, would potentially consider initiating statin.      Relevant Medications   metoprolol tartrate (LOPRESSOR) 50 MG tablet   Other Relevant Orders   Lipid panel (Completed)   Comprehensive metabolic panel (Completed)     Other   Precordial pain    Even though she is trying to work on her anxiety and stress, she still happening these episodes of intermittent chest pain that happen both with rest exertion and associated with dyspnea.  Concern is that this could potentially be anginal pain.  She has a strong family history.  Plan: Check Coronary CTA with possible for CT. We will need preprocedure labs      Relevant Orders   Lipid panel (Completed)   Comprehensive metabolic panel (Completed)   CT CORONARY MORPH W/CTA COR W/SCORE W/CA  W/CM &/OR WO/CM   Family history of CHF (congestive heart failure) (Chronic)    Echo looks pretty good.  No signs of CHF.      Metabolic syndrome (Chronic)    Morbid obesity, hide triglycerides, HTN and hyperglycemia-DM.  She meets all criteria.  Very high risk with strong family history.  Plan to restratify with a coronary CTA.  This will provide US anatomy and physiology to potentially explain her chest discomfort and exertional dyspnea, but also given a baseline assessment of her anatomy to know how aggressive we need to be with management going forward.      Class 2 severe obesity with serious comorbidity and body mass index (BMI) of 38.0 to 38.9 in adult [facility])   Situational syncope   DOE (dyspnea on exertion) (Chronic)   Relevant Orders   Lipid panel (Completed)   Comprehensive metabolic panel (Completed)   CT CORONARY MORPH W/CTA COR W/SCORE W/CA W/CM &/OR WO/CM   Family history of early CAD (Chronic)    Exclude significant CAD with Coronary CTA      Relevant Orders   Lipid panel (Completed)   Comprehensive metabolic panel (Completed)   OSA on CPAP (Chronic)   Relevant Orders   CT CORONARY MORPH W/CTA COR W/SCORE W/CA W/CM &/OR WO/CM   Palpitations (Chronic)    Overall, palpitations seem pretty well controlled.  She is not currently on beta-blocker but I think this is probably related to stress.  We will reassess.  Relevant Orders   CT CORONARY MORPH W/CTA COR W/SCORE W/CA W/CM &/OR WO/CM   ===================================  HPI:    Amanda Mcintosh is a morbidly obese 51 y.o. female with history of HTN, pre-DM, OSA on CPAP, chronic seasonal allergies and migraine headaches who is being seen today for the FOLLOW-UP evaluation of Chest Pain and Shortness of Breath  at the request of Marrian Salvage,*, FNP / Caleen Jobs, NP  Recent Hospitalizations/clinic visits:  No recent hospitalizations/ER visits.  DAJANE VALLI was seen on 07/27/2022 for  initial consultation with multiple complaints and issues.  Has had episodes of significant hypotension (blood pressures over 210/110 mmHg, often associated with palpitations or chest tightness.  Likely related to anxiety with off-and-on symptoms leading back to February.  Also having episodes of chest pressure and tightness off and on usually while sitting at rest but sometimes lying down at night.  Not necessarily with exertion.  Once or twice a week.  Off-and-on palpitations and fast heart rates several times a day no particular time of day.  Usually tries to calm down the symptoms resolved within 10 minutes.  Has dyspnea with or without certain activity.  No PND but sleeps with CPAP due to sleep apnea.  Left arm pain off and on. Step 1: Check 2 D Echo & consider FitBit vs. Kardia Mobile Step 2: Pending Echo Results -> GXT(or CPX) vs. Cor CTA   [facility] noted BMI of 42.29, but in the "action stage of change ".  Desires to lose 70 to 70 pounds.  Highest weight ever 260 pounds.  Scales feels frequently but has significant cravings.  Has some binge eating behaviors and liquids with calories. => At follow-up visit on May 28, 2022 was started on Rybelsus 3 mg daily-unfortunately did not follow back up.  Follow-up pulmonary evaluation with Dr. Silas Flood, he felt symptoms very highly suspicious for asthma.  Encouraged ENT evaluation for chronic sinusitis.  Started on albuterol along with Symbicort (versus Breo)  => Recently laid off from work, losing his insurance at the end of October.  Never started Rybelsus. => Was given a 90-day prescription of Diovan HCTZ 160-25 mg at PCPs office.  Blood pressure was 120/70 on 09/18/2022.  Reviewed  CV studies:    The following studies were reviewed today: (if available, images/films reviewed: From Epic Chart or Care Everywhere) TTE 08/15/2022: NORMAL.  EF 55 to 60%.  No RWMA.  Normal diastolic parameters.  Unable to assess PAP  and otherwise normal RV.  Normal AOV and MV.  Mildly elevated RAP. PFTs (09/14/2022) lung volumes normal.  DLCO normal.  Normal spirometry  Interval History:   JESSICIA NAPOLITANO presents for follow-up today just to discuss results of her echo and PFTs.  She says that she thinks may be being laid off from work as it was a blessing and skies because she is not as nervous or stressed.  She is also not doing as much.  However she still does note having her intermittent episodes of chest discomfort and exertional dyspnea.  It can happen both at rest and with exertion but seems to be worse with exertion.  When she is Arnell Asal trying to do is just try to stay calm and take nice easy breaths through it.  She says that over the last several months she has been under a lot of stress between being laid off from her job, her husband diagnosed with cancer and her brother  passing away.  She thinks this all may have come to ahead.  She is still very concerned about the chest discomfort.  She is concerned because she has had episodes of arm pain radiating to the chest and shortness of breath.  She was happy to hear the results of her echocardiogram, but is hoping for some definitive answers.  CV Review of Symptoms (Summary) Cardiovascular ROS: positive for - chest pain, dyspnea on exertion, irregular heartbeat, palpitations, rapid heart rate, shortness of breath, and as noted above, not as significant. negative for - irregular heartbeat, loss of consciousness, orthopnea, paroxysmal nocturnal dyspnea, or syncope/near syncope or TIA/amaurosis fugax, claudication  REVIEWED OF SYSTEMS   Review of Systems  Constitutional:  Positive for malaise/fatigue (Probably pretty much deconditioned not very active.). Negative for weight loss (Is wanting to lose weight, but not able to).  HENT:  Negative for congestion and nosebleeds.   Respiratory:  Positive for shortness of breath (Per HPI). Negative for cough.   Cardiovascular:         Per HPI  Gastrointestinal:  Negative for blood in stool and melena.  Genitourinary:  Negative for hematuria.  Musculoskeletal:  Positive for back pain Fraser Din has off-and-on back spasms.  She has PRN Flexeril but has not used it recently.) and myalgias (Left arm from shoulder to elbow has been hurting and somewhat numb for the last 2 weeks). Negative for falls.  Neurological:  Positive for headaches. Negative for dizziness, focal weakness, seizures, loss of consciousness and weakness.  Endo/Heme/Allergies:  Positive for environmental allergies (Has Flonase and albuterol.).  Psychiatric/Behavioral:  Positive for depression (Controlled with Lexapro.). Negative for memory loss. The patient is nervous/anxious (Is on Lexapro.). The patient does not have insomnia.    I have reviewed and (if needed) personally updated the patient's problem list, medications, allergies, past medical and surgical history, social and family history.   PAST MEDICAL HISTORY   Past Medical History:  Diagnosis Date   Fibroids    Heartburn    History of headache    Hyperlipidemia    Hypertension    Obesity    Thyroid condition    Vitamin D deficiency     PAST SURGICAL HISTORY   Past Surgical History:  Procedure Laterality Date   CHOLECYSTECTOMY  12/31/2004   Pulmonary function tests  09/14/2022   Normal lung volumes and DLCO.  Normal spirometry.   ROBOT ASSISTED MYOMECTOMY  12/31/2009   Stress Echocardiogram  08/2012   Exercise to max HR 164 bpm - 93% MPHR 177 bpm.  Normal BP and HR response.  No electrical or echocardiographic evidence of exercise-induced ischemia.  Normal EF, baseline > 55% with no RWMA.  Peak stress LVEF increased to 65-70% with no RWMA.  LOW RISK.   TONSILLECTOMY     TRANSTHORACIC ECHOCARDIOGRAM  08/15/2022   NORMAL.  EF 55 to 60%.  No RWMA.  Normal diastolic parameters.  Unable to assess PAP and otherwise normal RV.  Normal AOV and MV.  Mildly elevated RAP.   WISDOM TOOTH EXTRACTION       Immunization History  Administered Date(s) Administered   PFIZER(Purple Top)SARS-COV-2 Vaccination 04/12/2020, 05/04/2020, 12/05/2020   PNEUMOCOCCAL CONJUGATE-20 12/12/2021   Tdap 08/31/2013    MEDICATIONS/ALLERGIES   Current Meds  Medication Sig   albuterol (VENTOLIN HFA) 108 (90 Base) MCG/ACT inhaler Inhale 1-2 puffs into the lungs every 6 (six) hours as needed for wheezing or shortness of breath. (Patient taking differently: Inhale 1-2 puffs into the lungs as needed  for wheezing or shortness of breath.)   AMBULATORY NON FORMULARY MEDICATION Compression knee sleeve for pre-patellar bursitis Dispense 1 pre-patellar bursitis Use as needed   Ascorbic Acid (VITAMIN C) 1000 MG tablet Take 1,000 mg by mouth as needed.   Blood Pressure Monitoring Soln KIT    diclofenac Sodium (VOLTAREN) 1 % GEL Apply 4 g topically 4 (four) times daily. To affected joint.   fluticasone (FLONASE) 50 MCG/ACT nasal spray Place 1 spray into both nostrils in the morning and at bedtime. (Patient taking differently: Place 1 spray into both nostrils as needed.)   metoprolol tartrate (LOPRESSOR) 50 MG tablet Take 2 tablets (100 mg total) by mouth once for 1 dose. TAKE TWO HOURS PRIOR TO  SCHEDULE CARDIAC TEST   Multiple Vitamin (MULTIVITAMIN WITH MINERALS) TABS tablet Take 1 tablet by mouth daily.   tiZANidine (ZANAFLEX) 4 MG tablet Take 1 tablet (4 mg total) by mouth every 8 (eight) hours as needed for muscle spasms.   Turmeric 500 MG TABS Take 500 mg by mouth as needed.   valsartan-hydrochlorothiazide (DIOVAN-HCT) 160-25 MG tablet Take 1 tablet by mouth daily.   vitamin B-12 (CYANOCOBALAMIN) 100 MCG tablet Take 100 mcg by mouth daily.   vitamin E 1000 UNIT capsule Take 1,000 Units by mouth daily.   vitamin k 100 MCG tablet Take 100 mcg by mouth daily.   Zinc 50 MG CAPS Take by mouth.   Current Facility-Administered Medications for the 10/10/22 encounter (Office Visit) with Leonie Man, MD  Medication    diclofenac Sodium (VOLTAREN) 1 % topical gel 4 g     Allergies  Allergen Reactions   Gadolinium Derivatives Other (See Comments)    PT STARTED SHAKING AND FELT VERY COLD INTERNALLY BUT FACE WAS VERY FLUSHED. THIS WAS A DELAYED REACTION (ABOUT 1 HOUR AFTER INJECTION). SHE TOOK BENADRYL AND HYDRATED AND SYMPTOMS SUBSIDED    SOCIAL HISTORY/FAMILY HISTORY   Family history: Mother with hypertension, diabetes, CVA, CHF, CAD, father with COPD, hypertension Social history: Smoker, quit early 2023, about half pack per day, lives in [place] in [place]:   Social History   Tobacco Use   Smoking status: Former    Packs/day: 0.30    Types: Cigarettes    Quit date: 02/27/2021    Years since quitting: 1.6    Passive exposure: Past   Smokeless tobacco: Never   Tobacco comments:    Quit in Feb 27 2022. Tay 07/26/22  Substance Use Topics   Alcohol use: No   Drug use: No   Social History   Social History Narrative   Not on file   Family History  Problem Relation Age of Onset   Diabetes Mother    Hypertension Mother    Heart failure Mother    Stroke Mother    High Cholesterol Mother    Depression Mother    Anxiety disorder Mother    Sleep apnea Mother    Obesity Mother    Obstructive Sleep Apnea Mother    Coronary artery disease Mother 22       s/p PCI   Diabetes Mellitus II Mother    Heart attack Mother 86   COPD Father        smoker   High blood pressure Father    High Cholesterol Father    Breast cancer Sister    Skin cancer Sister    HIV/AIDS Brother    Heart disease Brother 8       "enlarged heart" = presented  with Acute Resp Failure   Hyperlipidemia Other    Migraines Neg Hx     OBJCTIVE -PE, EKG, labs   Wt Readings from Last 3 Encounters:  10/10/22 269 lb 9.6 oz (122.3 kg)  09/18/22 268 lb 9.6 oz (121.8 kg)  09/14/22 266 lb (120.7 kg)    Physical Exam: BP 120/84   Pulse 74   Ht '5\' 5"'  (1.651 m)   Wt 269 lb 9.6 oz (122.3 kg)   LMP 02/28/2017    SpO2 93%   BMI 44.86 kg/m  Physical Exam Constitutional:      General: She is not in acute distress.    Appearance: She is not ill-appearing or toxic-appearing.     Comments: Morbidly obese.  Well-groomed.  HENT:     Head: Normocephalic and atraumatic.  Eyes:     Extraocular Movements: Extraocular movements intact.     Conjunctiva/sclera: Conjunctivae normal.     Pupils: Pupils are equal, round, and reactive to light.  Cardiovascular:     Rate and Rhythm: Normal rate and regular rhythm. No extrasystoles are present.    Chest Wall: PMI displaced: Unable to palpate.     Pulses: Decreased pulses (Difficult to palpate, but intact.).     Heart sounds: S1 normal. Heart sounds are distant. No murmur (Unable to assess, cannot exclude soft murmur.) heard.    No friction rub. No gallop.  Pulmonary:     Effort: Pulmonary effort is normal. No respiratory distress.     Breath sounds: Normal breath sounds. No wheezing, rhonchi or rales.  Abdominal:     General: Bowel sounds are normal. There is no distension.     Palpations: Abdomen is soft. There is no mass.     Tenderness: There is no abdominal tenderness. There is no guarding or rebound.     Comments: Obese.  Unable to assess HSM or bruit.  Musculoskeletal:        General: Swelling (Trivial ankle swelling) and tenderness (Tender to palpation of the left upper arm) present. No deformity.  Skin:    Coloration: Skin is not jaundiced or pale.  Neurological:     General: No focal deficit present.     Mental Status: She is alert and oriented to person, place, and time.     Gait: Gait normal.  Psychiatric:        Mood and Affect: Mood normal.        Behavior: Behavior normal.        Thought Content: Thought content normal.        Judgment: Judgment normal.     Adult ECG Report  Rate: 56;  Rhythm: sinus bradycardia and artifact otherwise normal axis, intervals and durations. ; Borderline low voltage  Narrative Interpretation:  Borderline  Recent Labs: Due for follow-up labs. Lab Results  Component Value Date   CHOL 252 (H) 10/10/2022   HDL 38 (L) 10/10/2022   LDLCALC 187 (H) 10/10/2022   LDLDIRECT 139.0 09/06/2016   TRIG 148 10/10/2022   CHOLHDL 6.6 (H) 10/10/2022   Lab Results  Component Value Date   CREATININE 0.92 10/10/2022   BUN 14 10/10/2022   NA 141 10/10/2022   K 4.2 10/10/2022   CL 101 10/10/2022   CO2 25 10/10/2022      Latest Ref Rng & Units 04/03/2022   12:40 PM 03/02/2022   10:16 AM 12/12/2021    3:09 PM  CBC  WBC 4.0 - 10.5 K/uL 11.6  11.1  11.9   Hemoglobin  12.0 - 15.0 g/dL 13.6  12.9  12.7   Hematocrit 36.0 - 46.0 % 40.7  39.0  39.2   Platelets 150 - 400 K/uL 397  359.0  392.0     Lab Results  Component Value Date   HGBA1C 6.1 (H) 08/14/2022   Lab Results  Component Value Date   TSH 0.813 08/14/2022    ================================================== I spent a total of 17 minutes with the patient spent in direct patient consultation.  Additional time spent with chart review  / charting (studies, outside notes, etc): 28 min Total Time: 47 min  Current medicines are reviewed at length with the patient today.  (+/- concerns) N/A  Notice: This dictation was prepared with Dragon dictation along with smart phrase technology. Any transcriptional errors that result from this process are unintentional and may not be corrected upon review.   Studies Ordered:  Orders Placed This Encounter  Procedures   CT CORONARY MORPH W/CTA COR W/SCORE W/CA W/CM &/OR WO/CM   Lipid panel   Comprehensive metabolic panel   Meds ordered this encounter  Medications   metoprolol tartrate (LOPRESSOR) 50 MG tablet    Sig: Take 2 tablets (100 mg total) by mouth once for 1 dose. TAKE TWO HOURS PRIOR TO  SCHEDULE CARDIAC TEST    Dispense:  2 tablet    Refill:  0    Patient Instructions / Medication Changes & Studies & Tests Ordered   Patient Instructions  Medication Instructions:    *If  you need a refill on your cardiac medications before your next appointment, please call your pharmacy*   Lab Work: Lipid Cmp  If you have labs (blood work) drawn today and your tests are completely normal, you will receive your results only by: [place] (if you have MyChart) OR A paper copy in the mail If you have any lab test that is abnormal or we need to change your treatment, we will call you to review the results.   Testing/Procedures: Your physician has requested that you have coronary  CTA. Cardiac computed tomography (CT)angigram is a painless test that uses an x-ray machine to take clear, detailed pictures of your heart arteries . Please follow instruction sheet as given.     Follow-Up: At [facility], you and your health needs are our priority.  As part of our continuing mission to provide you with exceptional heart care, we have created designated Provider Care Teams.  These Care Teams include your primary Cardiologist (physician) and Advanced Practice Providers (APPs -  Physician Assistants and Nurse Practitioners) who all work together to provide you with the care you need, when you need it.     Your next appointment:   6 month(s)  The format for your next appointment:   In Person  Provider:   Glenetta Hew, MD    Other Instructions    Your cardiac CT will be scheduled at  below location:   [facility] [address] [place], [place] [address]   For scheduling needs, including cancellations and rescheduling, please call [place], [phone].     Glenetta Hew, M.D., M.S. Interventional Cardiologist   Pager # [phone] Phone # [phone] [address] [place], [place] [address]   Thank you for choosing Heartcare at [facility]!!

## 2022-10-10 NOTE — Assessment & Plan Note (Signed)
BP stable on current meds.-Valsartan-HCTZ.

## 2022-10-10 NOTE — Patient Instructions (Addendum)
Medication Instructions:    *If you need a refill on your cardiac medications before your next appointment, please call your pharmacy*   Lab Work: Lipid Cmp  If you have labs (blood work) drawn today and your tests are completely normal, you will receive your results only by: [place] (if you have MyChart) OR A paper copy in the mail If you have any lab test that is abnormal or we need to change your treatment, we will call you to review the results.   Testing/Procedures: Your physician has requested that you have coronary  CTA. Cardiac computed tomography (CT)angigram is a painless test that uses an x-ray machine to take clear, detailed pictures of your heart arteries . Please follow instruction sheet as given.     Follow-Up: At [facility], you and your health needs are our priority.  As part of our continuing mission to provide you with exceptional heart care, we have created designated Provider Care Teams.  These Care Teams include your primary Cardiologist (physician) and Advanced Practice Providers (APPs -  Physician Assistants and Nurse Practitioners) who all work together to provide you with the care you need, when you need it.     Your next appointment:   6 month(s)  The format for your next appointment:   In Person  Provider:   Glenetta Hew, MD    Other Instructions    Your cardiac CT will be scheduled at  below location:   [facility] [address] [place] [address], [place] [address] [phone]    please arrive at the [facility] (Entrance C2) of [facility] 30 minutes prior to test start time. You can use the FREE valet parking offered at entrance C (encouraged to control the heart rate for the test)  Proceed to the [facility] Radiology Department (first floor) to check-in and test prep.  All radiology patients and guests should use entrance C2 at [facility], accessed from [facility],  even though the hospital's physical address listed is [address]       Please follow these instructions carefully (unless otherwise directed):  Please have lab -CMP done at least 7 days prior to testing   On the Night Before the Test: Be sure to Drink plenty of water. Do not consume any caffeinated/decaffeinated beverages or chocolate 12 hours prior to your test. Do not take any antihistamines 12 hours prior to your test.    On the Day of the Test: Drink plenty of water until 1 hour prior to the test. Do not eat any food 1 hour prior to test. You may take your regular medications prior to the test.  Take metoprolol (Lopressor) 100 mg  two hours prior to test. HOLD Valsartan/Hydrochlorothiazide morning of the test. FEMALES- please wear underwire-free bra if available, avoid dresses & tight clothing          After the Test: Drink plenty of water. After receiving IV contrast, you may experience a mild flushed feeling. This is normal. On occasion, you may experience a mild rash up to 24 hours after the test. This is not dangerous. If this occurs, you can take Benadryl 25 mg and increase your fluid intake. If you experience trouble breathing, this can be serious. If it is severe call 911 IMMEDIATELY. If it is mild, please call our office. If you take any of these medications: Glipizide/Metformin, Avandament, Glucavance, please do not take 48 hours after completing test unless otherwise  instructed.  We will call to schedule your test 2-4 weeks out understanding that some insurance companies will need an authorization prior to the service being performed.   For non-scheduling related questions, please contact the cardiac imaging nurse navigator should you have any questions/concerns: Marchia Bond, Cardiac Imaging Nurse Navigator Gordy Clement, Cardiac Imaging Nurse Navigator [facility] Direct Office Dial: [phone]   For scheduling  needs, including cancellations and rescheduling, please call [place], [phone].

## 2022-10-10 NOTE — Telephone Encounter (Signed)
Patient calling aboutupcoming cardiac imaging study; pt verbalizes understanding of appt date/time, parking situation and where to check in, and medications ordered, and verified current allergies; name and call back number provided for further questions should they arise  Gordy Clement RN Navigator Cardiac Imaging [facility] [phone] office [phone] cell  Patient to take '100mg'$  metoprolol tartrate two hours prior to her cardiac CT scan. She is aware to arrive at 9:30am.

## 2022-10-10 NOTE — Assessment & Plan Note (Signed)
Morbid obesity, hide triglycerides, HTN and hyperglycemia-DM.  She meets all criteria.  Very high risk with strong family history.  Plan to restratify with a coronary CTA.  This will provide US anatomy and physiology to potentially explain her chest discomfort and exertional dyspnea, but also given a baseline assessment of her anatomy to know how aggressive we need to be with management going forward.

## 2022-10-10 NOTE — Assessment & Plan Note (Signed)
Echo looks pretty good.  No signs of CHF.

## 2022-10-10 NOTE — Assessment & Plan Note (Signed)
Exclude significant CAD with Coronary CTA

## 2022-10-11 ENCOUNTER — Ambulatory Visit (HOSPITAL_COMMUNITY)
Admission: RE | Admit: 2022-10-11 | Discharge: 2022-10-11 | Disposition: A | Discharge status: Home / Self Care | Payer: No Typology Code available for payment source | Source: Ambulatory Visit | Attending: Cardiology | Admitting: Cardiology

## 2022-10-11 DIAGNOSIS — R002 Palpitations: Secondary | ICD-10-CM | POA: Diagnosis present

## 2022-10-11 DIAGNOSIS — R0609 Other forms of dyspnea: Secondary | ICD-10-CM | POA: Diagnosis present

## 2022-10-11 DIAGNOSIS — G4733 Obstructive sleep apnea (adult) (pediatric): Secondary | ICD-10-CM | POA: Diagnosis present

## 2022-10-11 DIAGNOSIS — R072 Precordial pain: Secondary | ICD-10-CM | POA: Insufficient documentation

## 2022-10-11 MED ORDER — NITROGLYCERIN 0.4 MG SL SUBL
0.8000 mg | SUBLINGUAL_TABLET | Freq: Once | SUBLINGUAL | Status: AC
  Administered 2022-10-11: 0.8 mg via SUBLINGUAL

## 2022-10-11 MED ORDER — NITROGLYCERIN 0.4 MG SL SUBL
SUBLINGUAL_TABLET | SUBLINGUAL | Status: AC
  Filled 2022-10-11: qty 2

## 2022-10-11 MED ORDER — IOHEXOL 350 MG/ML SOLN
100.0000 mL | Freq: Once | INTRAVENOUS | Status: AC | PRN
  Administered 2022-10-11: 100 mL via INTRAVENOUS

## 2022-10-17 ENCOUNTER — Telehealth: Payer: Self-pay | Admitting: *Deleted

## 2022-10-17 ENCOUNTER — Ambulatory Visit (INDEPENDENT_AMBULATORY_CARE_PROVIDER_SITE_OTHER): Payer: No Typology Code available for payment source | Admitting: Family Medicine

## 2022-10-17 ENCOUNTER — Encounter (INDEPENDENT_AMBULATORY_CARE_PROVIDER_SITE_OTHER): Payer: Self-pay | Admitting: Family Medicine

## 2022-10-17 VITALS — BP 131/84 | HR 78 | Temp 97.6°F | Ht 65.0 in | Wt 262.0 lb

## 2022-10-17 DIAGNOSIS — E7849 Other hyperlipidemia: Secondary | ICD-10-CM | POA: Diagnosis not present

## 2022-10-17 DIAGNOSIS — R7303 Prediabetes: Secondary | ICD-10-CM

## 2022-10-17 DIAGNOSIS — E559 Vitamin D deficiency, unspecified: Secondary | ICD-10-CM | POA: Diagnosis not present

## 2022-10-17 DIAGNOSIS — Z6841 Body Mass Index (BMI) 40.0 and over, adult: Secondary | ICD-10-CM

## 2022-10-17 DIAGNOSIS — E669 Obesity, unspecified: Secondary | ICD-10-CM

## 2022-10-17 DIAGNOSIS — G4733 Obstructive sleep apnea (adult) (pediatric): Secondary | ICD-10-CM

## 2022-10-17 MED ORDER — VITAMIN D (ERGOCALCIFEROL) 1.25 MG (50000 UNIT) PO CAPS: 50000.0000 [IU] | ORAL_CAPSULE | ORAL | 0 refills | Status: DC

## 2022-10-17 MED ORDER — RYBELSUS 3 MG PO TABS: 3.0000 mg | ORAL_TABLET | Freq: Every day | ORAL | 0 refills | Status: DC

## 2022-10-17 MED ORDER — ROSUVASTATIN CALCIUM 20 MG PO TABS: 20.0000 mg | ORAL_TABLET | Freq: Every day | ORAL | 3 refills | Status: DC

## 2022-10-17 NOTE — Telephone Encounter (Signed)
Called - no answer ,left a detailed message on voicemail per DPR. Instructed patient to start Rosuvastatin 20 mg daily . Prescription sent into the pharmacy for patient to pick- up..   Patient had informed RN earlier,there will be a change of insurance coverage as of 10/19/22.  Any question patient can call office.

## 2022-10-17 NOTE — Telephone Encounter (Signed)
-----   Message from Leonie Man, MD sent at 10/10/2022 11:58 PM EDT ----- Pre-CT scan chemistry panel looks great.  Normal kidney function.  Normal potassium chloride levels along with bicarb.  Normal LFTs...  Cholesterol panel shows very poor controlled cholesterol levels.  Thankfully, triglycerides are okay and HDL is actually up a little bit from where it had been, LDL is 187.  We clearly will need to initiate therapy for this.  I think definitely going back on the Rybelsus will be helpful.  Working up probably need to talk about starting on medicine for cholesterol based on these numbers.  Would like to start off with rosuvastatin 20 mg daily.  Rx rosuvastatin 20 mg p.o. daily, dispense #90 tabs, 3 refills   Glenetta Hew, MD

## 2022-10-25 NOTE — Progress Notes (Signed)
Chief Complaint:   OBESITY Malikah is here to discuss her progress with her obesity treatment plan along with follow-up of her obesity related diagnoses. Rainbow is on the Category 2 Plan and states she is following her eating plan approximately 50% of the time. Simrat states she is walking 30 minutes 3 times per week.  Today's visit was #: 3 Starting weight: 262 lbs Starting date: 08/14/2022 Today's weight: 262 lbs Today's date: 10/17/2022 Total lbs lost to date: 0 Total lbs lost since last in-office visit: 1 lb  Interim History: She waited until Sunday to start Rybelsus, she is doing well without side effects.  She has been cooking more at home.  Stress remains high, her husband has been diagnosed with prostate cancer.  Getting severance pay until March 2024.  Tried out the gym and a yoga class.   Subjective:   1. Vitamin D deficiency She is currently taking prescription vitamin D 50,000 IU each week. She denies nausea, vomiting or muscle weakness.  Energy level improving.  Last Vitamin D 31.   2. OSA on CPAP Using CPAP nightly and getting 7-8 hours of sleep.   3. Other hyperlipidemia Started Crestor 20 mg daily with Dr Ellyn Hack.  She is doing well on it.  She denies any  myalgias. She has a positive family history of hyperlipidemia.   4. Prediabetes A1c 6.1.  She is doing well on Rybelsus 3 mg daily.  She is reducing starches and sweets.    Assessment/Plan:   1. Vitamin D deficiency Recheck Vitamin D level in 2 months.   Refill - Vitamin D, Ergocalciferol, (DRISDOL) 1.25 MG (50000 UNIT) CAPS capsule; Take 1 capsule (50,000 Units total) by mouth every 7 (seven) days.  Dispense: 5 capsule; Refill: 0  2. OSA on CPAP Continue CPAP nightly and plan for weight loss.   3. Other hyperlipidemia Continue Crestor 20 mg daily per cardiology.   4. Prediabetes Refill - Semaglutide (RYBELSUS) 3 MG TABS; Take 3 mg by mouth daily.  Dispense: 30 tablet; Refill: 0  5.  Obesity,current BMI 38.4 Kambrey is currently in the action stage of change. As such, her goal is to continue with weight loss efforts. She has agreed to the Category 2 Plan.   Exercise goals:  Treadmill, plans to do 15 minutes per day.   Behavioral modification strategies: increasing lean protein intake, increasing vegetables, increasing water intake, decreasing eating out, no skipping meals, meal planning and cooking strategies, better snacking choices, and decreasing junk food.  Lekita has agreed to follow-up with our clinic in 4 weeks. She was informed of the importance of frequent follow-up visits to maximize her success with intensive lifestyle modifications for her multiple health conditions.   Objective:   Blood pressure 131/84, pulse 78, temperature 97.6 F (36.4 C), height '5\' 5"'$  (1.651 m), weight 262 lb (118.8 kg), last menstrual period 02/28/2017, SpO2 97 %. Body mass index is 43.6 kg/m.  General: Cooperative, alert, well developed, in no acute distress. HEENT: Conjunctivae and lids unremarkable. Cardiovascular: Regular rhythm.  Lungs: Normal work of breathing. Neurologic: No focal deficits.   Lab Results  Component Value Date   CREATININE 0.92 10/10/2022   BUN 14 10/10/2022   NA 141 10/10/2022   K 4.2 10/10/2022   CL 101 10/10/2022   CO2 25 10/10/2022   Lab Results  Component Value Date   ALT 21 10/10/2022   AST 14 10/10/2022   ALKPHOS 83 10/10/2022   BILITOT 0.4 10/10/2022  Lab Results  Component Value Date   HGBA1C 6.1 (H) 08/14/2022   HGBA1C 6.1 03/02/2022   HGBA1C 6.1 12/12/2021   HGBA1C 6.0 03/31/2021   HGBA1C 5.6 04/05/2020   Lab Results  Component Value Date   INSULIN 19.5 02/17/2019   Lab Results  Component Value Date   TSH 0.813 08/14/2022   Lab Results  Component Value Date   CHOL 252 (H) 10/10/2022   HDL 38 (L) 10/10/2022   LDLCALC 187 (H) 10/10/2022   LDLDIRECT 139.0 09/06/2016   TRIG 148 10/10/2022   CHOLHDL 6.6 (H) 10/10/2022    Lab Results  Component Value Date   VD25OH 23.25 (L) 12/12/2021   VD25OH 29.11 (L) 04/05/2020   VD25OH 36.1 02/17/2019   Lab Results  Component Value Date   WBC 11.6 (H) 04/03/2022   HGB 13.6 04/03/2022   HCT 40.7 04/03/2022   MCV 89.6 04/03/2022   PLT 397 04/03/2022   Lab Results  Component Value Date   FERRITIN 83.9 12/02/2013    Attestation Statements:   Reviewed by clinician on day of visit: allergies, medications, problem list, medical history, surgical history, family history, social history, and previous encounter notes.  I, Davy Pique, am acting as Location manager for Loyal Gambler, DO.  I have reviewed the above documentation for accuracy and completeness, and I agree with the above. Dell Ponto, DO

## 2022-11-08 ENCOUNTER — Other Ambulatory Visit: Payer: Self-pay | Admitting: Family Medicine

## 2022-11-08 NOTE — Telephone Encounter (Signed)
Rx refill request approved per Dr. Corey's orders. 

## 2022-11-12 IMAGING — DX DG LUMBAR SPINE 2-3V
3 series · 3 of 3 positions shown · non-contrast
Comparison: 01/05/2020

CLINICAL DATA: Low back pain radiating into bilateral hips for 1
year, worse with sitting and

EXAM:
LUMBAR SPINE - 2-3 VIEW

[l-spine ap]
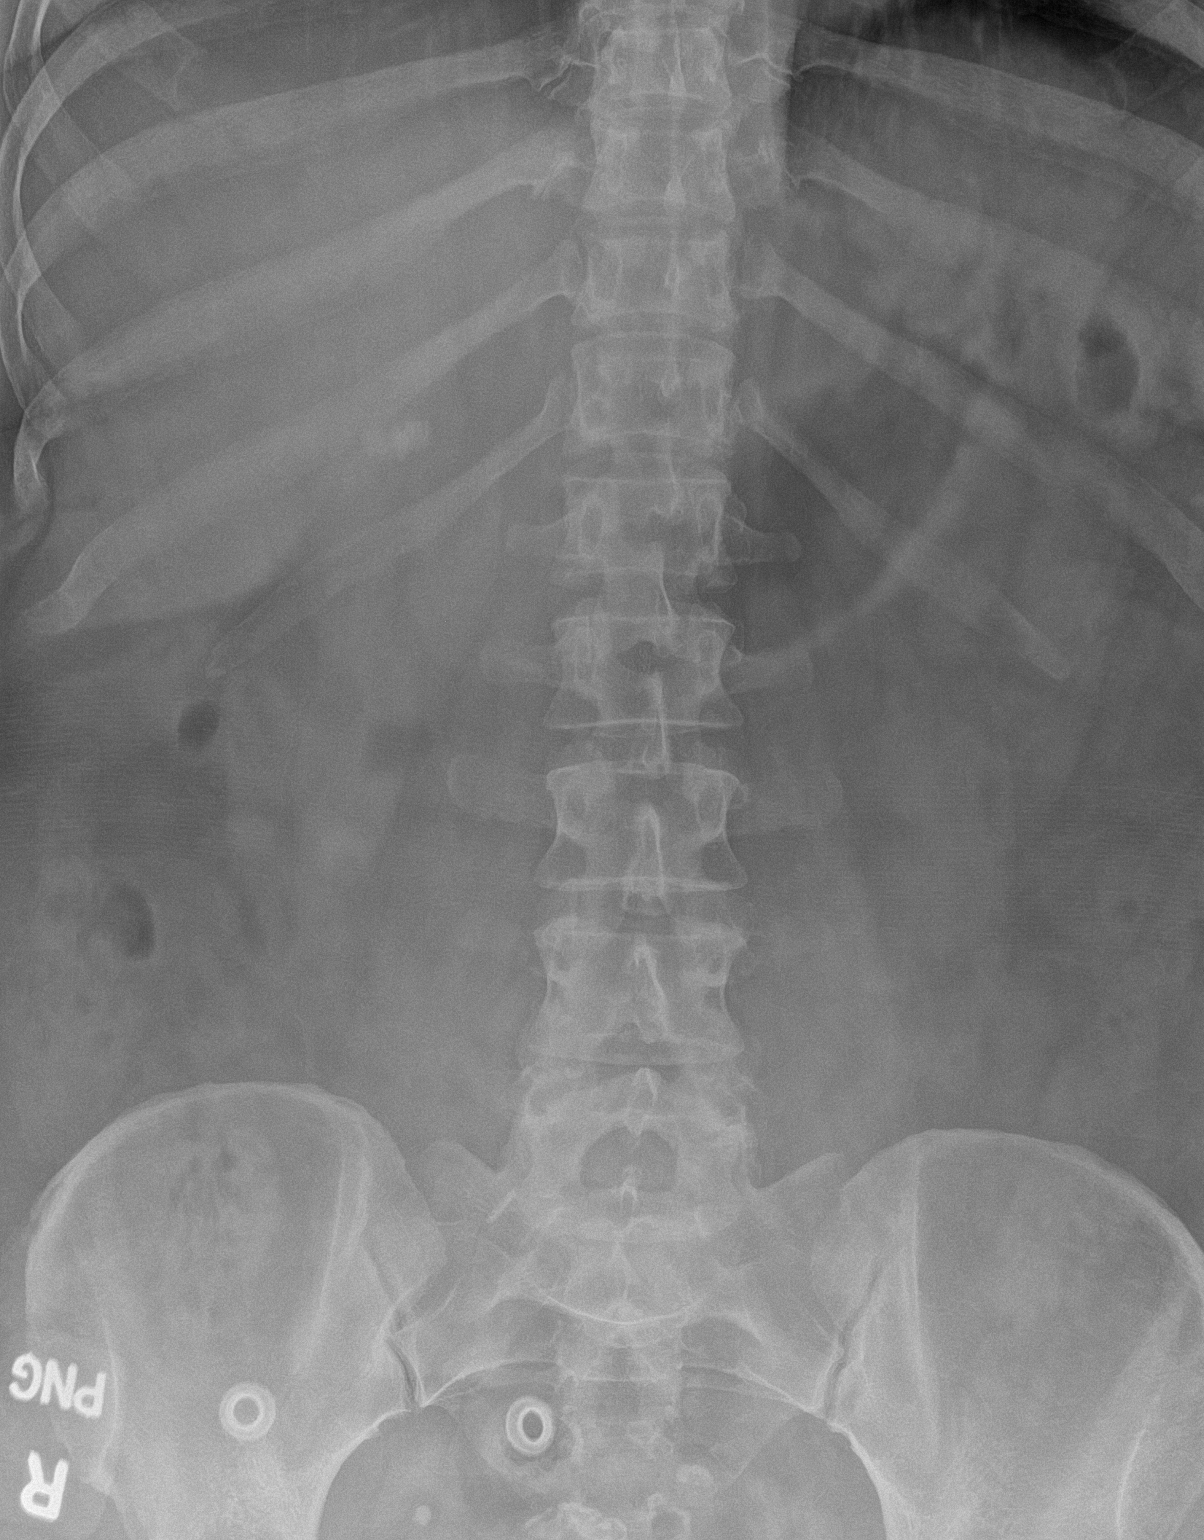

[l-spine lateral (1 of 2)]
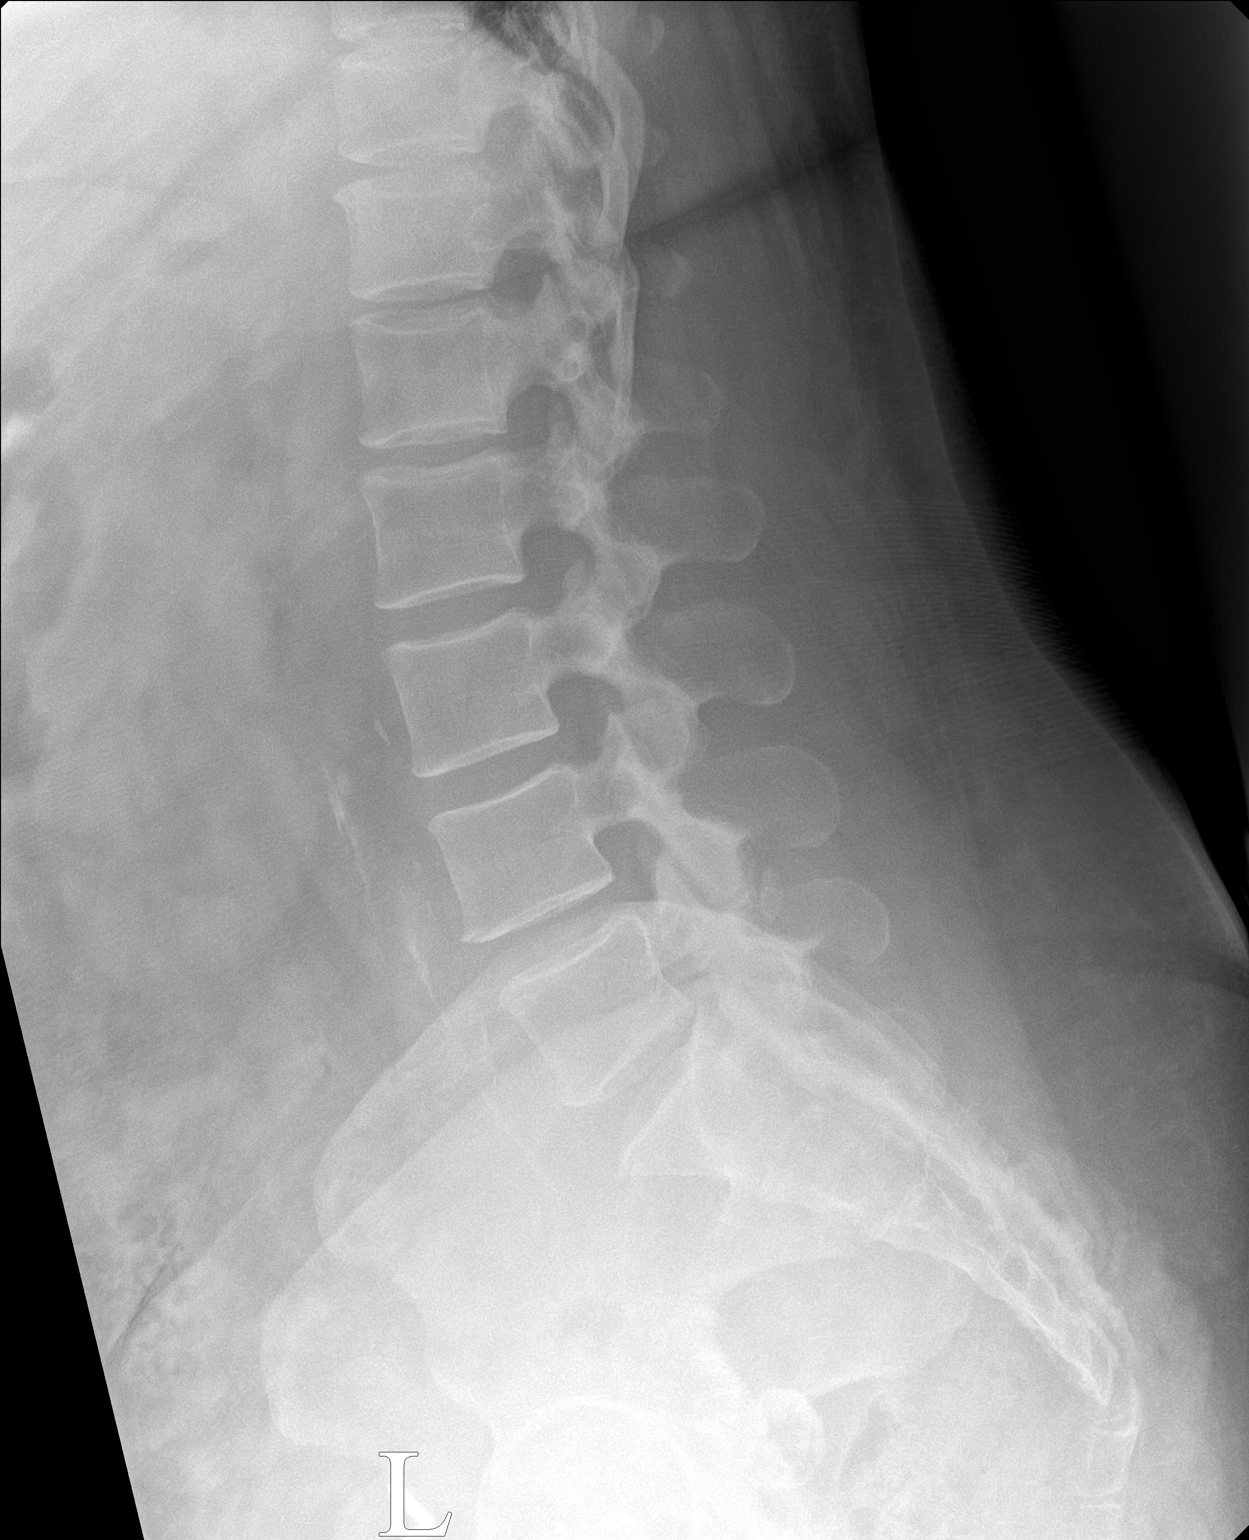

[l-spine lateral (2 of 2)]
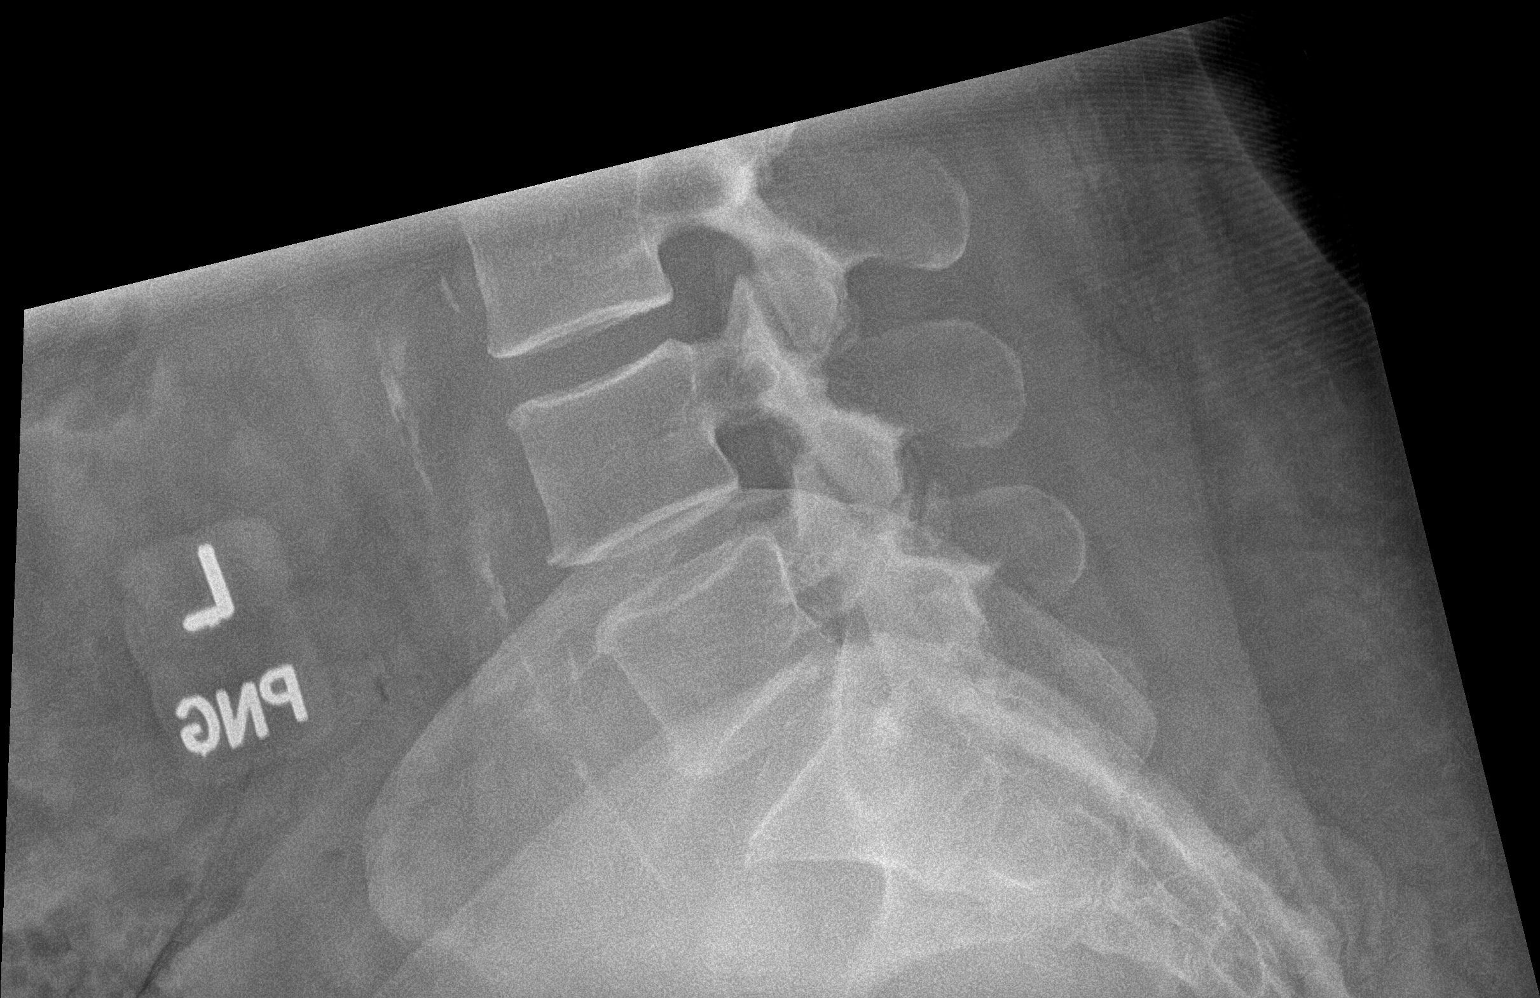

[3 of 3 positions shown; findings below may reference images not displayed]

FINDINGS: Frontal and lateral views of the lumbar spine are obtained. There
are 5 non-rib-bearing lumbar type vertebral bodies in anatomic
alignment. No acute fractures. Mild facet hypertrophy at L4-5 and
L5-S1 unchanged. Disc spaces are well preserved. Sacroiliac joints
are normal.
IMPRESSION: 1. Stable lumbar spine.  No acute process.

## 2022-11-14 ENCOUNTER — Encounter (INDEPENDENT_AMBULATORY_CARE_PROVIDER_SITE_OTHER): Payer: Self-pay | Admitting: Family Medicine

## 2022-11-14 ENCOUNTER — Ambulatory Visit (INDEPENDENT_AMBULATORY_CARE_PROVIDER_SITE_OTHER): Payer: 59 | Admitting: Family Medicine

## 2022-11-14 VITALS — BP 115/73 | HR 74 | Temp 98.0°F | Ht 65.0 in | Wt 262.0 lb

## 2022-11-14 DIAGNOSIS — R7303 Prediabetes: Secondary | ICD-10-CM

## 2022-11-14 DIAGNOSIS — Z6841 Body Mass Index (BMI) 40.0 and over, adult: Secondary | ICD-10-CM

## 2022-11-14 DIAGNOSIS — G4733 Obstructive sleep apnea (adult) (pediatric): Secondary | ICD-10-CM | POA: Diagnosis not present

## 2022-11-14 DIAGNOSIS — E669 Obesity, unspecified: Secondary | ICD-10-CM

## 2022-11-14 DIAGNOSIS — E559 Vitamin D deficiency, unspecified: Secondary | ICD-10-CM | POA: Diagnosis not present

## 2022-11-14 MED ORDER — VITAMIN D (ERGOCALCIFEROL) 1.25 MG (50000 UNIT) PO CAPS: 50000.0000 [IU] | ORAL_CAPSULE | ORAL | 0 refills | Status: DC

## 2022-11-14 MED ORDER — RYBELSUS 3 MG PO TABS: 3.0000 mg | ORAL_TABLET | Freq: Every day | ORAL | 0 refills | Status: DC

## 2022-11-27 NOTE — Progress Notes (Signed)
Chief Complaint:   OBESITY Amanda Mcintosh is here to discuss her progress with her obesity treatment plan along with follow-up of her obesity related diagnoses. Amanda Mcintosh is on the Category 2 Plan and states she is following her eating plan approximately 50% of the time. Amanda Mcintosh states she is walking some 15-30 minutes 2-3 times per week.  Today's visit was #: 4 Starting weight: 262 lbs Starting date: 08/14/2022 Today's weight: 262 lbs Today's date: 11/14/2022 Total lbs lost to date: 0 Total lbs lost since last in-office visit: 0  Interim History: She has cut back on sweet tea, soda and juice.  Her stress levels are high. She went back to skipping meals, worsened by Rybelsus 3 mg, started for prediabetes. Added in more walking on the treadmill.  Husband like sweets, but that does not tempt her.  Subjective:   1. Prediabetes Last A1c 6.1 on Rybelsus 3 mg every morning.  She denies side effects.  Improved satiety.    2. Vitamin D deficiency She is currently taking prescription vitamin D 50,000 IU each week. She denies nausea, vomiting or muscle weakness. Vitamin D level 31.    3. OSA on CPAP She is on CPAP nightly.   Assessment/Plan:   1. Prediabetes Recheck A1c in December.   Refill - Semaglutide (RYBELSUS) 3 MG TABS; Take 3 mg by mouth daily.  Dispense: 30 tablet; Refill: 0  2. Vitamin D deficiency Recheck level in December.   Refill - Vitamin D, Ergocalciferol, (DRISDOL) 1.25 MG (50000 UNIT) CAPS capsule; Take 1 capsule (50,000 Units total) by mouth every 7 (seven) days.  Dispense: 5 capsule; Refill: 0  3. OSA on CPAP Aim for 7-8 hours of sleep with CPAP.   4. Obesity,current BMI 43.7 Reviewed  better frozen food options and laughing cow cheese, [place] or chicken sausage.   Amanda Mcintosh is currently in the action stage of change. As such, her goal is to continue with weight loss efforts. She has agreed to the Category 2 Plan.   Exercise goals:  walking 30 minutes at least 4  times per week.   Behavioral modification strategies: increasing lean protein intake, increasing vegetables, increasing water intake, decreasing eating out, no skipping meals, meal planning and cooking strategies, keeping healthy foods in the home, and planning for success.  Amanda Mcintosh has agreed to follow-up with our clinic in 4 weeks. She was informed of the importance of frequent follow-up visits to maximize her success with intensive lifestyle modifications for her multiple health conditions.   Objective:   Blood pressure 115/73, pulse 74, temperature 98 F (36.7 C), height '5\' 5"'$  (1.651 m), weight 262 lb (118.8 kg), last menstrual period 02/28/2017, SpO2 98 %. Body mass index is 43.6 kg/m.  General: Cooperative, alert, well developed, in no acute distress. HEENT: Conjunctivae and lids unremarkable. Cardiovascular: Regular rhythm.  Lungs: Normal work of breathing. Neurologic: No focal deficits.   Lab Results  Component Value Date   CREATININE 0.92 10/10/2022   BUN 14 10/10/2022   NA 141 10/10/2022   K 4.2 10/10/2022   CL 101 10/10/2022   CO2 25 10/10/2022   Lab Results  Component Value Date   ALT 21 10/10/2022   AST 14 10/10/2022   ALKPHOS 83 10/10/2022   BILITOT 0.4 10/10/2022   Lab Results  Component Value Date   HGBA1C 6.1 (H) 08/14/2022   HGBA1C 6.1 03/02/2022   HGBA1C 6.1 12/12/2021   HGBA1C 6.0 03/31/2021   HGBA1C 5.6 04/05/2020   Lab Results  Component Value Date   INSULIN 19.5 02/17/2019   Lab Results  Component Value Date   TSH 0.813 08/14/2022   Lab Results  Component Value Date   CHOL 252 (H) 10/10/2022   HDL 38 (L) 10/10/2022   LDLCALC 187 (H) 10/10/2022   LDLDIRECT 139.0 09/06/2016   TRIG 148 10/10/2022   CHOLHDL 6.6 (H) 10/10/2022   Lab Results  Component Value Date   VD25OH 23.25 (L) 12/12/2021   VD25OH 29.11 (L) 04/05/2020   VD25OH 36.1 02/17/2019   Lab Results  Component Value Date   WBC 11.6 (H) 04/03/2022   HGB 13.6 04/03/2022    HCT 40.7 04/03/2022   MCV 89.6 04/03/2022   PLT 397 04/03/2022   Lab Results  Component Value Date   FERRITIN 83.9 12/02/2013   Attestation Statements:   Reviewed by clinician on day of visit: allergies, medications, problem list, medical history, surgical history, family history, social history, and previous encounter notes.  I, Davy Pique, am acting as Location manager for Loyal Gambler, DO.  I have reviewed the above documentation for accuracy and completeness, and I agree with the above. Dell Ponto, DO

## 2022-12-03 ENCOUNTER — Ambulatory Visit: Payer: 59 | Admitting: Pulmonary Disease

## 2022-12-13 ENCOUNTER — Ambulatory Visit (INDEPENDENT_AMBULATORY_CARE_PROVIDER_SITE_OTHER): Payer: 59 | Admitting: Family Medicine

## 2023-01-16 ENCOUNTER — Encounter (INDEPENDENT_AMBULATORY_CARE_PROVIDER_SITE_OTHER): Payer: Self-pay | Admitting: Family Medicine

## 2023-01-16 ENCOUNTER — Ambulatory Visit (INDEPENDENT_AMBULATORY_CARE_PROVIDER_SITE_OTHER): Payer: Commercial Managed Care - HMO | Admitting: Family Medicine

## 2023-01-16 VITALS — BP 116/78 | HR 98 | Temp 98.3°F | Ht 65.0 in | Wt 266.0 lb

## 2023-01-16 DIAGNOSIS — E669 Obesity, unspecified: Secondary | ICD-10-CM

## 2023-01-16 DIAGNOSIS — E7849 Other hyperlipidemia: Secondary | ICD-10-CM

## 2023-01-16 DIAGNOSIS — Z6841 Body Mass Index (BMI) 40.0 and over, adult: Secondary | ICD-10-CM

## 2023-01-16 DIAGNOSIS — R7303 Prediabetes: Secondary | ICD-10-CM | POA: Diagnosis not present

## 2023-01-16 DIAGNOSIS — E559 Vitamin D deficiency, unspecified: Secondary | ICD-10-CM | POA: Diagnosis not present

## 2023-01-16 MED ORDER — RYBELSUS 3 MG PO TABS: 3.0000 mg | ORAL_TABLET | Freq: Every day | ORAL | 0 refills | Status: DC

## 2023-01-16 MED ORDER — VITAMIN D (ERGOCALCIFEROL) 1.25 MG (50000 UNIT) PO CAPS: 50000.0000 [IU] | ORAL_CAPSULE | ORAL | 0 refills | Status: DC

## 2023-02-07 NOTE — Progress Notes (Signed)
Chief Complaint:   OBESITY Amanda Mcintosh is here to discuss her progress with her obesity treatment plan along with follow-up of her obesity related diagnoses. Amanda Mcintosh is on the Category 2 Plan and states she is following her eating plan approximately 50% of the time. Amanda Mcintosh states she is walking 30 minutes 4 times per week.  Today's visit was #: 5 Starting weight: 262 lbs Starting date: 08/14/2022 Today's weight: 266 lbs Today's date: 01/16/2023 Total lbs lost to date: 0 Total lbs lost since last in-office visit: +4 lbs  Interim History: Patient has been through a lot on her husband's side of family, eating meals brought over.  Patient has been walking on the treadmill and did some outdoor walking.  Patient is eating some meals on plan.  Net weight gain 4 LB in 5 months.  Subjective:   1. Pre-diabetes Last A1c 6.1 on 08/14/2022.  Patient states that Rybelsus 3 mg has not started.  Insurance no longer covers it.  Patient is actively reducing sugar intake.  2. Other hyperlipidemia On 10/10/2022, total cholesterol was 252, triglycerides 148, HDL 38, LDL 187.  Prescription for rosuvastatin 20 mg daily per Dr. Ellyn Hack but has not started.  3. Vitamin D deficiency Vitamin D level 31 on 08/14/2022.  Patient is taking prescription vitamin D 50,000 IU weekly, ran out 2 weeks ago.  Patient complains of fatigue.  Assessment/Plan:   1. Pre-diabetes Refill- Semaglutide (RYBELSUS) 3 MG TABS; Take 3 mg by mouth daily.  Dispense: 30 tablet; Refill: 0 Take medication 30 minutes before breakfast daily.  2. Other hyperlipidemia Keep upcoming follow-up with Dr. Ellyn Hack to recheck lipids.  Continue annual saturated fat diet.  3. Vitamin D deficiency Recheck level in 1 month.  Refill- Vitamin D, Ergocalciferol, (DRISDOL) 1.25 MG (50000 UNIT) CAPS capsule; Take 1 capsule (50,000 Units total) by mouth every 7 (seven) days.  Dispense: 5 capsule; Refill: 0  4. Obesity,current BMI 44.4 1.  Plan to  change Rybelsus to [place] next visit if Rybelsus is no longer covered. 2.  Discussed role of weight loss surgery if failing to progress.  Amanda Mcintosh is currently in the action stage of change. As such, her goal is to continue with weight loss efforts. She has agreed to the Category 2 Plan.   Exercise goals:  As is.  Behavioral modification strategies: increasing lean protein intake, decreasing simple carbohydrates, increasing vegetables, increasing water intake, decreasing eating out, no skipping meals, meal planning and cooking strategies, and decreasing junk food.  Amanda Mcintosh has agreed to follow-up with our clinic in 4 weeks. She was informed of the importance of frequent follow-up visits to maximize her success with intensive lifestyle modifications for her multiple health conditions.   Objective:   Blood pressure 116/78, pulse 98, temperature 98.3 F (36.8 C), height 5' 5"$  (1.651 m), weight 266 lb (120.7 kg), last menstrual period 02/28/2017, SpO2 96 %. Body mass index is 44.26 kg/m.  General: Cooperative, alert, well developed, in no acute distress. HEENT: Conjunctivae and lids unremarkable. Cardiovascular: Regular rhythm.  Lungs: Normal work of breathing. Neurologic: No focal deficits.   Lab Results  Component Value Date   CREATININE 0.92 10/10/2022   BUN 14 10/10/2022   NA 141 10/10/2022   K 4.2 10/10/2022   CL 101 10/10/2022   CO2 25 10/10/2022   Lab Results  Component Value Date   ALT 21 10/10/2022   AST 14 10/10/2022   ALKPHOS 83 10/10/2022   BILITOT 0.4 10/10/2022   Lab Results  Component Value Date   HGBA1C 6.1 (H) 08/14/2022   HGBA1C 6.1 03/02/2022   HGBA1C 6.1 12/12/2021   HGBA1C 6.0 03/31/2021   HGBA1C 5.6 04/05/2020   Lab Results  Component Value Date   INSULIN 19.5 02/17/2019   Lab Results  Component Value Date   TSH 0.813 08/14/2022   Lab Results  Component Value Date   CHOL 252 (H) 10/10/2022   HDL 38 (L) 10/10/2022   LDLCALC 187 (H)  10/10/2022   LDLDIRECT 139.0 09/06/2016   TRIG 148 10/10/2022   CHOLHDL 6.6 (H) 10/10/2022   Lab Results  Component Value Date   VD25OH 23.25 (L) 12/12/2021   VD25OH 29.11 (L) 04/05/2020   VD25OH 36.1 02/17/2019   Lab Results  Component Value Date   WBC 11.6 (H) 04/03/2022   HGB 13.6 04/03/2022   HCT 40.7 04/03/2022   MCV 89.6 04/03/2022   PLT 397 04/03/2022   Lab Results  Component Value Date   FERRITIN 83.9 12/02/2013   Attestation Statements:   Reviewed by clinician on day of visit: allergies, medications, problem list, medical history, surgical history, family history, social history, and previous encounter notes.  I, Davy Pique, am acting as Location manager for Loyal Gambler, DO.  I have reviewed the above documentation for accuracy and completeness, and I agree with the above. Dell Ponto, DO

## 2023-02-18 ENCOUNTER — Ambulatory Visit (INDEPENDENT_AMBULATORY_CARE_PROVIDER_SITE_OTHER): Payer: Commercial Managed Care - HMO | Admitting: Family Medicine

## 2023-02-18 ENCOUNTER — Encounter (INDEPENDENT_AMBULATORY_CARE_PROVIDER_SITE_OTHER): Payer: Self-pay | Admitting: Family Medicine

## 2023-02-18 VITALS — BP 137/86 | HR 66 | Temp 98.3°F | Ht 65.0 in | Wt 264.0 lb

## 2023-02-18 DIAGNOSIS — E559 Vitamin D deficiency, unspecified: Secondary | ICD-10-CM

## 2023-02-18 DIAGNOSIS — E88819 Insulin resistance, unspecified: Secondary | ICD-10-CM

## 2023-02-18 DIAGNOSIS — R7303 Prediabetes: Secondary | ICD-10-CM

## 2023-02-18 DIAGNOSIS — Z6841 Body Mass Index (BMI) 40.0 and over, adult: Secondary | ICD-10-CM | POA: Diagnosis not present

## 2023-02-18 MED ORDER — VITAMIN D (ERGOCALCIFEROL) 1.25 MG (50000 UNIT) PO CAPS: 50000.0000 [IU] | ORAL_CAPSULE | ORAL | 0 refills | Status: DC

## 2023-02-18 NOTE — Assessment & Plan Note (Signed)
She is finishing out her RX for Rybelsus but it is no longer covered by insurance for prediabetes. She will discontinue Rybelsus once her RX is complete She had used metformin before and it caused adverse SEs  Lab Results  Component Value Date   HGBA1C 6.1 (H) 08/14/2022

## 2023-02-18 NOTE — Assessment & Plan Note (Signed)
Check fasting insulin today.  Patient has a history of insulin resistance with prediabetes and metabolic syndrome.  She has been intolerant of metformin and no longer has insurance coverage for Rybelsus or any other GLP-1 receptor agonist.  She has struggled with hyperphasia with insulin resistance.  We have discussed the option for weight loss surgery given her obesity, failure to see weight loss over the past few months and growing list of weight related comorbidities.

## 2023-02-18 NOTE — Progress Notes (Signed)
Office: [phone]  /  Fax: [phone]  WEIGHT SUMMARY AND BIOMETRICS  Medical Weight Loss Height: 5' 5"$  (1.651 m) Weight: 264 lb (119.7 kg) Temp: 98.3 F (36.8 C) Pulse Rate: 66 BP: 137/86 SpO2: 97 % Fasting: yes Labs: yes Today's Visit #: 6 Weight at Last VIsit: 266lb Weight Lost Since Last Visit: 2lb  Body Fat %: 49.2 % Fat Mass (lbs): 129.8 lbs Muscle Mass (lbs): 127.4 lbs Total Body Water (lbs): 87.6 lbs Visceral Fat Rating : 16 Starting Date: 08/14/22 Starting Weight: 266lb Total Weight Loss (lbs): 2 lb (0.907 kg)    HPI  Chief Complaint: OBESITY  Amanda Mcintosh is here to discuss her progress with her obesity treatment plan. She is on the the Category 2 Plan and states she is following her eating plan approximately 80 % of the time. She states she is walking for 15 minutes 3-4 times a week.    Interval History:  Since last office visit she is down 2 lb since her last visit. She started a Lenten fast with her church.  She fasts 6 days/ wk, having only water with meds  until 4 pm.  Eats a meal at 4:30 pm and denies snacking but can eat till midnight on the fast.  She can eat fruits, veggies and seafood.  She did eat Raisinettes at the movies.   She has had some family stressors  She is using her treadmill at home Her severance pay will run out March 1st Went to a bariatric surgery seminar at [place] and is considering sleeve gastrectomy.  Her husband has not been supportive.  He is also overweight.  Pharmacotherapy: Rybelsus (ran out recently)  PHYSICAL EXAM:  Blood pressure 137/86, pulse 66, temperature 98.3 F (36.8 C), height 5' 5"$  (1.651 m), weight 264 lb (119.7 kg), last menstrual period 02/28/2017, SpO2 97 %. Body mass index is 43.93 kg/m.  General: She is overweight, cooperative, alert, well developed, and in no acute distress. PSYCH: Has normal mood, affect and thought process.   HEENT: EOMI, sclerae are anicteric. Lungs: Normal breathing effort, no  conversational dyspnea. Extremities: No edema.  Neurologic: No gross sensory or motor deficits. No tremors or fasciculations noted.    DIAGNOSTIC DATA REVIEWED:  BMET    Component Value Date/Time   NA 141 10/10/2022 1131   K 4.2 10/10/2022 1131   CL 101 10/10/2022 1131   CO2 25 10/10/2022 1131   GLUCOSE 120 (H) 10/10/2022 1131   GLUCOSE 96 04/03/2022 1240   BUN 14 10/10/2022 1131   CREATININE 0.92 10/10/2022 1131   CALCIUM 9.4 10/10/2022 1131   GFRNONAA >60 04/03/2022 1240   GFRAA >60 02/03/2019 0013   Lab Results  Component Value Date   HGBA1C 6.1 (H) 08/14/2022   HGBA1C 5.3 04/20/2015   Lab Results  Component Value Date   INSULIN 19.5 02/17/2019   Lab Results  Component Value Date   TSH 0.813 08/14/2022   CBC    Component Value Date/Time   WBC 11.6 (H) 04/03/2022 1240   RBC 4.54 04/03/2022 1240   HGB 13.6 04/03/2022 1240   HCT 40.7 04/03/2022 1240   PLT 397 04/03/2022 1240   MCV 89.6 04/03/2022 1240   MCH 30.0 04/03/2022 1240   MCHC 33.4 04/03/2022 1240   RDW 12.2 04/03/2022 1240   Iron Studies    Component Value Date/Time   FERRITIN 83.9 12/02/2013 1103   Lipid Panel     Component Value Date/Time   CHOL 252 (H) 10/10/2022 1131  TRIG 148 10/10/2022 1131   HDL 38 (L) 10/10/2022 1131   CHOLHDL 6.6 (H) 10/10/2022 1131   CHOLHDL 6 03/31/2021 1557   VLDL 31.8 03/31/2021 1557   LDLCALC 187 (H) 10/10/2022 1131   LDLDIRECT 139.0 09/06/2016 1458   Hepatic Function Panel     Component Value Date/Time   PROT 7.1 10/10/2022 1131   ALBUMIN 4.2 10/10/2022 1131   AST 14 10/10/2022 1131   ALT 21 10/10/2022 1131   ALKPHOS 83 10/10/2022 1131   BILITOT 0.4 10/10/2022 1131   BILIDIR 0.1 12/02/2013 1103      Component Value Date/Time   TSH 0.813 08/14/2022 1648   TSH 0.99 03/31/2021 1557   Nutritional Lab Results  Component Value Date   VD25OH 23.25 (L) 12/12/2021   VD25OH 29.11 (L) 04/05/2020   VD25OH 36.1 02/17/2019     ASSESSMENT AND  PLAN  TREATMENT PLAN FOR OBESITY:  Recommended Dietary Goals  Amanda Mcintosh is currently in the action stage of change. As such, her goal is to continue weight management plan. She has agreed to the Category 2 Plan.  Behavioral Intervention  We discussed the following Behavioral Modification Strategies today: increasing lean protein intake, increasing vegetables, increase water intake, work on meal planning and easy cooking plans, think about ways to increase physical activity, and emotional eating strategies.  Additional resources provided today: NA  Recommended Physical Activity Goals  Amanda Mcintosh has been advised to work up to 150 minutes of moderate intensity aerobic activity a week and strengthening exercises 2-3 times per week for cardiovascular health, weight loss maintenance and preservation of muscle mass.   She has agreed to Patient also encouraged on scheduling and tracking physical activity.    Pharmacotherapy We discussed various medication options to help Amanda Mcintosh with her weight loss efforts and we both agreed to none.  ASSOCIATED CONDITIONS ADDRESSED TODAY  Vitamin D deficiency Assessment & Plan: Last vitamin D Lab Results  Component Value Date   VD25OH 23.25 (L) 0000000    Amanda Mcintosh has been taking prescription vitamin D 50,000 IU once weekly.  Recheck vitamin D level today.  Her energy level is slowly improving.  Orders: -     VITAMIN D 25 Hydroxy (Vit-D Deficiency, Fractures) -     Vitamin D (Ergocalciferol); Take 1 capsule (50,000 Units total) by mouth every 7 (seven) days.  Dispense: 5 capsule; Refill: 0  Morbid obesity (HCC)  BMI 40.0-44.9, adult ([place])  Pre-diabetes Assessment & Plan: She is finishing out her RX for Rybelsus but it is no longer covered by insurance for prediabetes. She will discontinue Rybelsus once her RX is complete She had used metformin before and it caused adverse SEs  Lab Results  Component Value Date   HGBA1C 6.1 (H) 08/14/2022       Orders: -     Hemoglobin A1c  Insulin resistance Assessment & Plan: Check fasting insulin today.  Patient has a history of insulin resistance with prediabetes and metabolic syndrome.  She has been intolerant of metformin and no longer has insurance coverage for Rybelsus or any other GLP-1 receptor agonist.  She has struggled with hyperphasia with insulin resistance.  We have discussed the option for weight loss surgery given her obesity, failure to see weight loss over the past few months and growing list of weight related comorbidities.  Orders: -     Insulin, random      Return in about 4 weeks (around 03/18/2023).Marland Kitchen She was informed of the importance of frequent follow up visits  to maximize her success with intensive lifestyle modifications for her multiple health conditions.   ATTESTASTION STATEMENTS:  Reviewed by clinician on day of visit: allergies, medications, problem list, medical history, surgical history, family history, social history, and previous encounter notes.   Time spent on visit including pre-visit chart review and post-visit care and charting was 30 minutes.    Dell Ponto, DO

## 2023-02-18 NOTE — Assessment & Plan Note (Signed)
Last vitamin D Lab Results  Component Value Date   VD25OH 23.25 (L) 0000000    Amanda Mcintosh has been taking prescription vitamin D 50,000 IU once weekly.  Recheck vitamin D level today.  Her energy level is slowly improving.

## 2023-02-19 LAB — INSULIN, RANDOM: INSULIN: 29.4 u[IU]/mL — ABNORMAL HIGH (ref 2.6–24.9)

## 2023-02-19 LAB — VITAMIN D 25 HYDROXY (VIT D DEFICIENCY, FRACTURES): Vit D, 25-Hydroxy: 30.9 ng/mL (ref 30.0–100.0)

## 2023-02-19 LAB — HEMOGLOBIN A1C
Est. average glucose Bld gHb Est-mCnc: 123 mg/dL
Hgb A1c MFr Bld: 5.9 % — ABNORMAL HIGH (ref 4.8–5.6)

## 2023-03-18 ENCOUNTER — Ambulatory Visit (INDEPENDENT_AMBULATORY_CARE_PROVIDER_SITE_OTHER): Payer: Commercial Managed Care - HMO | Admitting: Adult Health

## 2023-04-12 ENCOUNTER — Encounter: Payer: Self-pay | Admitting: Family

## 2023-04-12 ENCOUNTER — Ambulatory Visit (INDEPENDENT_AMBULATORY_CARE_PROVIDER_SITE_OTHER): Payer: 59 | Admitting: Family

## 2023-04-12 VITALS — BP 122/78 | HR 65 | Temp 97.9°F | Resp 18 | Ht 65.0 in | Wt 268.6 lb

## 2023-04-12 DIAGNOSIS — R5383 Other fatigue: Secondary | ICD-10-CM | POA: Diagnosis not present

## 2023-04-12 DIAGNOSIS — I10 Essential (primary) hypertension: Secondary | ICD-10-CM

## 2023-04-12 DIAGNOSIS — F419 Anxiety disorder, unspecified: Secondary | ICD-10-CM

## 2023-04-12 DIAGNOSIS — E7849 Other hyperlipidemia: Secondary | ICD-10-CM

## 2023-04-12 DIAGNOSIS — F32A Depression, unspecified: Secondary | ICD-10-CM

## 2023-04-12 LAB — COMPREHENSIVE METABOLIC PANEL
ALT: 18 U/L (ref 0–35)
AST: 12 U/L (ref 0–37)
Albumin: 4.1 g/dL (ref 3.5–5.2)
Alkaline Phosphatase: 69 U/L (ref 39–117)
BUN: 16 mg/dL (ref 6–23)
CO2: 30 mEq/L (ref 19–32)
Calcium: 9.2 mg/dL (ref 8.4–10.5)
Chloride: 99 mEq/L (ref 96–112)
Creatinine, Ser: 0.87 mg/dL (ref 0.40–1.20)
GFR: 75.81 mL/min (ref >60.00)
Glucose, Bld: 82 mg/dL (ref 70–99)
Potassium: 4 mEq/L (ref 3.5–5.1)
Sodium: 138 mEq/L (ref 135–145)
Total Bilirubin: 0.6 mg/dL (ref 0.2–1.2)
Total Protein: 6.8 g/dL (ref 6.0–8.3)

## 2023-04-12 LAB — CBC WITH DIFFERENTIAL/PLATELET
Basophils Absolute: 0.1 10*3/uL (ref 0.0–0.1)
Basophils Relative: 0.5 % (ref 0.0–3.0)
Eosinophils Absolute: 0.2 10*3/uL (ref 0.0–0.7)
Eosinophils Relative: 1.7 % (ref 0.0–5.0)
HCT: 39.8 % (ref 36.0–46.0)
Hemoglobin: 13.2 g/dL (ref 12.0–15.0)
Lymphocytes Relative: 36.9 % (ref 12.0–46.0)
Lymphs Abs: 3.8 10*3/uL (ref 0.7–4.0)
MCHC: 33.1 g/dL (ref 30.0–36.0)
MCV: 87.8 fl (ref 78.0–100.0)
Monocytes Absolute: 0.8 10*3/uL (ref 0.1–1.0)
Monocytes Relative: 7.4 % (ref 3.0–12.0)
Neutro Abs: 5.5 10*3/uL (ref 1.4–7.7)
Neutrophils Relative %: 53.5 % (ref 43.0–77.0)
Platelets: 374 10*3/uL (ref 150.0–400.0)
RBC: 4.53 Mil/uL (ref 3.87–5.11)
RDW: 13.2 % (ref 11.5–15.5)
WBC: 10.4 10*3/uL (ref 4.0–10.5)

## 2023-04-12 LAB — MAGNESIUM: Magnesium: 1.8 mg/dL (ref 1.5–2.5)

## 2023-04-12 LAB — TSH: TSH: 0.96 u[IU]/mL (ref 0.35–5.50)

## 2023-04-12 LAB — VITAMIN B12: Vitamin B-12: 549 pg/mL (ref 211–911)

## 2023-04-12 MED ORDER — ROSUVASTATIN CALCIUM 20 MG PO TABS: 20.0000 mg | ORAL_TABLET | Freq: Every day | ORAL | 3 refills | Status: DC

## 2023-04-12 MED ORDER — ESCITALOPRAM OXALATE 10 MG PO TABS
10.0000 mg | ORAL_TABLET | Freq: Every day | ORAL | 1 refills | Status: DC
Start: 2023-04-12 — End: 2023-07-09

## 2023-04-12 MED ORDER — VALSARTAN-HYDROCHLOROTHIAZIDE 160-25 MG PO TABS: 1.0000 | ORAL_TABLET | Freq: Every day | ORAL | 3 refills | Status: DC

## 2023-04-12 NOTE — Progress Notes (Signed)
Amanda Mcintosh is a 52 y.o. female with the following history as recorded in Epic[facility]:  Patient Active Problem List   Diagnosis Date Noted   Pre-diabetes 01/16/2023   Other hyperlipidemia 10/17/2022   Vitamin D deficiency 08/28/2022   Prediabetes 08/28/2022   Insulin resistance 08/28/2022   Depression 08/28/2022   Essential hypertension 07/27/2022   Precordial pain 07/27/2022   Family history of CHF (congestive heart failure) 07/27/2022   Palpitations 07/27/2022   DOE (dyspnea on exertion) 07/27/2022   Hyperlipidemia due to dietary fat intake 07/27/2022   Metabolic syndrome 07/27/2022   OSA on CPAP 06/24/2019   Low back pain 04/30/2019   Dizziness 07/04/2017   Chronic seasonal allergic rhinitis 01/25/2017   Chronic sinusitis 01/25/2017   Lateral epicondylitis 01/13/2016   Amenorrhea 06/28/2015   Situational syncope 05/07/2015   GERD (gastroesophageal reflux disease) 04/21/2015   Routine general medical examination at a health care facility 04/21/2015   Iron deficiency anemia 12/02/2013   Tobacco use disorder 12/02/2013   Migraine headache without aura 03/11/2013   Family history of early CAD 09/08/2012    Current Outpatient Medications  Medication Sig Dispense Refill   albuterol (VENTOLIN HFA) 108 (90 Base) MCG/ACT inhaler Inhale 1-2 puffs into the lungs every 6 (six) hours as needed for wheezing or shortness of breath. (Patient taking differently: Inhale 1-2 puffs into the lungs as needed for wheezing or shortness of breath.) 1 each 0   AMBULATORY NON FORMULARY MEDICATION Compression knee sleeve for pre-patellar bursitis Dispense 1 pre-patellar bursitis Use as needed 1 Units 0   Ascorbic Acid (VITAMIN C) 1000 MG tablet Take 1,000 mg by mouth as needed.     budesonide-formoterol (SYMBICORT) 160-4.5 MCG/ACT inhaler Inhale 2 puffs into the lungs 2 (two) times daily. 1 each 3   diclofenac Sodium (VOLTAREN) 1 % GEL Apply 4 g topically 4 (four) times daily. To affected  joint. 100 g 11   fluticasone (FLONASE) 50 MCG/ACT nasal spray Place 1 spray into both nostrils in the morning and at bedtime. (Patient taking differently: Place 1 spray into both nostrils as needed.) 9.9 mL 2   tiZANidine (ZANAFLEX) 4 MG tablet TAKE 1 TABLET (4 MG TOTAL) BY MOUTH EVERY 8 (EIGHT) HOURS AS NEEDED FOR MUSCLE SPASMS 90 tablet 1   Turmeric 500 MG TABS Take 500 mg by mouth as needed.     escitalopram (LEXAPRO) 10 MG tablet Take 1 tablet (10 mg total) by mouth daily. 90 tablet 1   rosuvastatin (CRESTOR) 20 MG tablet Take 1 tablet (20 mg total) by mouth daily. 90 tablet 3   valsartan-hydrochlorothiazide (DIOVAN-HCT) 160-25 MG tablet Take 1 tablet by mouth daily. 90 tablet 3   Current Facility-Administered Medications  Medication Dose Route Frequency Provider Last Rate Last Admin   diclofenac Sodium (VOLTAREN) 1 % topical gel 4 g  4 g Topical QID Rodolph Bong, MD        Allergies: Gadolinium derivatives  Past Medical History:  Diagnosis Date   Fibroids    Heartburn    History of headache    Hyperlipidemia    Hypertension    Obesity    Thyroid condition    Vitamin D deficiency     Past Surgical History:  Procedure Laterality Date   CHOLECYSTECTOMY  12/31/2004   Pulmonary function tests  09/14/2022   Normal lung volumes and DLCO.  Normal spirometry.   ROBOT ASSISTED MYOMECTOMY  12/31/2009   Stress Echocardiogram  08/2012   Exercise to max HR 164  bpm - 93% MPHR 177 bpm.  Normal BP and HR response.  No electrical or echocardiographic evidence of exercise-induced ischemia.  Normal EF, baseline > 55% with no RWMA.  Peak stress LVEF increased to 65-70% with no RWMA.  LOW RISK.   TONSILLECTOMY     TRANSTHORACIC ECHOCARDIOGRAM  08/15/2022   NORMAL.  EF 55 to 60%.  No RWMA.  Normal diastolic parameters.  Unable to assess PAP and otherwise normal RV.  Normal AOV and MV.  Mildly elevated RAP.   WISDOM TOOTH EXTRACTION      Family History  Problem Relation Age of Onset   Diabetes  Mother    Hypertension Mother    Heart failure Mother    Stroke Mother    High Cholesterol Mother    Depression Mother    Anxiety disorder Mother    Sleep apnea Mother    Obesity Mother    Obstructive Sleep Apnea Mother    Coronary artery disease Mother 44       s/p PCI   Diabetes Mellitus II Mother    Heart attack Mother 69   COPD Father        smoker   High blood pressure Father    High Cholesterol Father    Breast cancer Sister    Skin cancer Sister    HIV/AIDS Brother    Heart disease Brother 58       "enlarged heart" = presented with Acute Resp Failure   Hyperlipidemia Other    Migraines Neg Hx     Social History   Tobacco Use   Smoking status: Former    Packs/day: .3    Types: Cigarettes    Quit date: 02/27/2021    Years since quitting: 2.1    Passive exposure: Past   Smokeless tobacco: Never   Tobacco comments:    Quit in Feb 27 2022. Tay 07/26/22  Substance Use Topics   Alcohol use: No    Subjective:  Patient has history of anxiety- does feel that symptoms are getting worse/ having increased problems with driving on highway; is working with therapist who recommended that she consider re-starting her medication; notes that symptoms seemed to worsen when she lost her uncle, aunt and brother all within a 1 week period last fall; has taken Lexapro in the past and would be willing to re-start.  "Just feeling paralyzed with anxiety" Is not taking her Crestor regularly but planning to start;     Objective:  Vitals:   04/12/23 1310  BP: 122/78  Pulse: 65  Resp: 18  Temp: 97.9 F (36.6 C)  TempSrc: Oral  SpO2: 98%  Weight: 268 lb 9.6 oz (121.8 kg)  Height: 5\' 5"  (1.651 m)    General: Well developed, well nourished, in no acute distress  Skin : Warm and dry.  Head: Normocephalic and atraumatic  Lungs: Respirations unlabored; clear to auscultation bilaterally without wheeze, rales, rhonchi  CVS exam: normal rate and regular rhythm.  Neurologic: Alert and  oriented; speech intact; face symmetrical; moves all extremities well; CNII-XII intact without focal deficit   Assessment:  1. Other fatigue   2. Anxiety and depression   3. Essential hypertension   4. Other hyperlipidemia     Plan:  Update labs today; suspect uncontrolled anxiety causing majority of symptoms; she agrees to re-start her Lexapro which she has used in the past; encouraged to keep working with her therapist;  3.   Stable; continue same medication; 4.   Stressed need  to take medication as prescribed; verified that prescription is up to date;   No follow-ups on file.  Orders Placed This Encounter  Procedures   B12   Magnesium   TSH   CBC with Differential/Platelet   Comp Met (CMET)    Requested Prescriptions   Signed Prescriptions Disp Refills   rosuvastatin (CRESTOR) 20 MG tablet 90 tablet 3    Sig: Take 1 tablet (20 mg total) by mouth daily.   valsartan-hydrochlorothiazide (DIOVAN-HCT) 160-25 MG tablet 90 tablet 3    Sig: Take 1 tablet by mouth daily.   escitalopram (LEXAPRO) 10 MG tablet 90 tablet 1    Sig: Take 1 tablet (10 mg total) by mouth daily.

## 2023-04-15 ENCOUNTER — Ambulatory Visit (INDEPENDENT_AMBULATORY_CARE_PROVIDER_SITE_OTHER): Payer: 59 | Admitting: Family Medicine

## 2023-04-15 ENCOUNTER — Ambulatory Visit (INDEPENDENT_AMBULATORY_CARE_PROVIDER_SITE_OTHER): Payer: 59

## 2023-04-15 VITALS — BP 102/58 | HR 80 | Ht 65.0 in | Wt 269.0 lb

## 2023-04-15 DIAGNOSIS — G8929 Other chronic pain: Secondary | ICD-10-CM | POA: Diagnosis not present

## 2023-04-15 DIAGNOSIS — M25551 Pain in right hip: Secondary | ICD-10-CM | POA: Diagnosis not present

## 2023-04-15 DIAGNOSIS — M545 Low back pain, unspecified: Secondary | ICD-10-CM

## 2023-04-15 MED ORDER — PREDNISONE 50 MG PO TABS: ORAL_TABLET | ORAL | 0 refills | Status: DC

## 2023-04-15 MED ORDER — GABAPENTIN 300 MG PO CAPS: 300.0000 mg | ORAL_CAPSULE | Freq: Three times a day (TID) | ORAL | 1 refills | Status: DC | PRN

## 2023-04-15 NOTE — Patient Instructions (Addendum)
Thank you for coming in today.   Please get an Xray today before you leave   I've sent a prescription for Gabapentin and Prednisone to your pharmacy.   Check back in 1 month

## 2023-04-15 NOTE — Progress Notes (Signed)
Rubin Payor, PhD, LAT, ATC acting as a scribe for Clementeen Graham, MD.  Amanda Mcintosh is a 52 y.o. female who presents to [facility] at [facility] today for R hip pain. Pt was last seen by Dr. Denyse Amass on 09/12/22 for LBP and R lateral hip pain and was prescribed tizanidine and taught HEP.   Today, pt reports R hip is very painful. Pt locates pain to the lateral aspect of her R hip w/ radiating pain to the anterior thigh. Pt will experience a "sharp pain" that goes from her R hip to ankle.   Low back pain: yes- stiffness Radiates: yes LE Numbness/tingling: yes LE Weakness: yes Aggravates: transitioning to stand after prolonged sitting Treatments tried: HEP,   Dx imaging: 04/18/21 L-spine XR 01/05/20 L-spine XR   Pertinent review of systems: No fevers or chills  Relevant historical information: Hypertension.   Exam:  BP (!) 102/58   Pulse 80   Ht 5\' 5"  (1.651 m)   Wt 269 lb (122 kg)   LMP 02/28/2017   SpO2 95%   BMI 44.76 kg/m  General: Well Developed, well nourished, and in no acute distress.   MSK: L-spine: Normal appearing Nontender palpation spinal midline. Normal lumbar motion. Mildly positive straight leg raise test.  Negative slump test. Lower extremity reflexes are intact. Strength intact generally except noted below.  Mild weakness to hip abduction.  Right hip: Normal-appearing Tender palpation greater trochanter. Hip abduction strength diminished 4/5 with pain. Normal hip motion.    Lab and Radiology Results   X-ray images lumbar spine and right hip obtained today personally and independently interpreted  Lumbar spine: No acute fractures.  Mild degenerative changes.  Right hip: Degenerative appearance right hip femoral acetabular joint especially laterally.  No acute fractures are visible  Await formal radiology review   Assessment and Plan: 52 y.o. female with right lateral hip pain with pain radiating down the leg.  This is  thought to be due to greater trochanteric bursitis/tendinitis and likely L3 or L4 lumbar radiculopathy on the right.  We discussed options.  Plan for trial of prednisone and gabapentin and recheck in a month.  If not improving or worsening we could have a trial of a greater trochanter injection.  She notes she is a bit afraid of needles and would like to avoid an injection if possible.   PDMP not reviewed this encounter. Orders Placed This Encounter  Procedures   DG HIP UNILAT W OR W/O PELVIS 2-3 VIEWS RIGHT    Standing Status:   Future    Number of Occurrences:   1    Standing Expiration Date:   05/15/2023    Order Specific Question:   Reason for Exam (SYMPTOM  OR DIAGNOSIS REQUIRED)    Answer:   low back pain    Order Specific Question:   Preferred imaging location?    Answer:   [facility]    Order Specific Question:   Is patient pregnant?    Answer:   No   DG Lumbar Spine 2-3 Views    Standing Status:   Future    Number of Occurrences:   1    Standing Expiration Date:   05/15/2023    Order Specific Question:   Reason for Exam (SYMPTOM  OR DIAGNOSIS REQUIRED)    Answer:   low back pain    Order Specific Question:   Preferred imaging location?    Answer:   [facility]  Order Specific Question:   Is patient pregnant?    Answer:   No   Meds ordered this encounter  Medications   predniSONE (DELTASONE) 50 MG tablet    Sig: Take 1 pill daily for 5 days    Dispense:  5 tablet    Refill:  0   gabapentin (NEURONTIN) 300 MG capsule    Sig: Take 1 capsule (300 mg total) by mouth 3 (three) times daily as needed.    Dispense:  90 capsule    Refill:  1     Discussed warning signs or symptoms. Please see discharge instructions. Patient expresses understanding.   The above documentation has been reviewed and is accurate and complete Clementeen Graham, M.D.

## 2023-04-18 NOTE — Progress Notes (Signed)
Right hip x-ray shows mild arthritis on both sides.  No fractures are visible.

## 2023-04-18 NOTE — Progress Notes (Signed)
Low back x-ray looks pretty normal to radiology.  No fractures or severe arthritis is visible. We can see a little plaque in the aorta.  You already on cholesterol medication and blood pressure medication which is appropriate for this.

## 2023-06-03 ENCOUNTER — Ambulatory Visit: Payer: 59 | Admitting: Family Medicine

## 2023-06-19 ENCOUNTER — Encounter: Payer: Self-pay | Admitting: Family Medicine

## 2023-06-19 ENCOUNTER — Ambulatory Visit (INDEPENDENT_AMBULATORY_CARE_PROVIDER_SITE_OTHER): Payer: Commercial Managed Care - HMO | Admitting: Family Medicine

## 2023-06-19 ENCOUNTER — Telehealth: Payer: Self-pay | Admitting: Family Medicine

## 2023-06-19 VITALS — BP 102/60 | HR 85 | Ht 65.0 in | Wt 269.0 lb

## 2023-06-19 DIAGNOSIS — M25551 Pain in right hip: Secondary | ICD-10-CM

## 2023-06-19 DIAGNOSIS — M7061 Trochanteric bursitis, right hip: Secondary | ICD-10-CM

## 2023-06-19 NOTE — Patient Instructions (Addendum)
Thank you for coming in today.   Ask the scheduler about iontophoresis patches for this.   Plan for PT.   If not better we can do a shot.

## 2023-06-19 NOTE — Telephone Encounter (Signed)
Pt called PT about the patches we recommended. She was informed that PT does not provide this service and that pt would have to be referred to ORtho.

## 2023-06-19 NOTE — Progress Notes (Unsigned)
   I, Stevenson Clinch, CMA acting as a scribe for Clementeen Graham, MD.  Amanda Mcintosh is a 52 y.o. female who presents to [facility] at [facility] today for f/u R hip pain. Pt was last seen by Dr. Denyse Amass on 04/15/23 for low back and R hip pain. She was prescribed gabapentin and prednisone.  Today, pt reports continued hip pain. Pt completed course of Prednisone but did not start Gabapentin because sharp shooting pain resolved. She read that statins can cause leg pain so she d/c the statin and notes that leg sx have improved significantly. Has severe needle phobia.   Dx imaging: 04/15/23 R hip & L-spine XR 04/18/21 L-spine XR 01/05/20 L-spine XR   Pertinent review of systems: No fevers or chills  Relevant historical information: Hypertension   Exam:  BP 102/60   Pulse 85   Ht 5\' 5"  (1.651 m)   Wt 269 lb (122 kg)   LMP 02/28/2017   SpO2 97%   BMI 44.76 kg/m  General: Well Developed, well nourished, and in no acute distress.   MSK: Right hip: Normal-appearing Tender palpation greater trochanter.  Normal hip range of motion. Hip abduction strength is reduced.      Assessment and Plan: 52 y.o. female with right lateral hip pain thought to be due to hip abductor tendinitis and greater trochanteric bursitis.  Ideally the next step here would be injection.  She has not had a good trial of physical therapy recently.  Will try PT including iontophoresis. If that not enough then we can do an injection.  PDMP not reviewed this encounter. Orders Placed This Encounter  Procedures   Ambulatory referral to Physical Therapy    Referral Priority:   Routine    Referral Type:   Physical Medicine    Referral Reason:   Specialty Services Required    Requested Specialty:   Physical Therapy   No orders of the defined types were placed in this encounter.    Discussed warning signs or symptoms. Please see discharge instructions. Patient expresses understanding.   The above  documentation has been reviewed and is accurate and complete Clementeen Graham, M.D.

## 2023-06-21 NOTE — Telephone Encounter (Signed)
Changing location of physical therapy to [facility].  I am 99% sure they do the iontophoresis there.  Please let me know if there is a problem with this physical therapy location.  If so we will do a shot.

## 2023-06-26 DIAGNOSIS — H5203 Hypermetropia, bilateral: Secondary | ICD-10-CM | POA: Diagnosis not present

## 2023-06-27 DIAGNOSIS — D2372 Other benign neoplasm of skin of left lower limb, including hip: Secondary | ICD-10-CM | POA: Insufficient documentation

## 2023-06-28 ENCOUNTER — Other Ambulatory Visit (HOSPITAL_COMMUNITY)
Admission: RE | Admit: 2023-06-28 | Discharge: 2023-06-28 | Disposition: A | Discharge status: Home / Self Care | Payer: Commercial Managed Care - HMO | Source: Ambulatory Visit | Attending: Family Medicine | Admitting: Family Medicine

## 2023-06-28 ENCOUNTER — Ambulatory Visit (INDEPENDENT_AMBULATORY_CARE_PROVIDER_SITE_OTHER): Payer: 59 | Admitting: Family Medicine

## 2023-06-28 ENCOUNTER — Encounter: Payer: Self-pay | Admitting: Family Medicine

## 2023-06-28 VITALS — BP 117/67 | HR 60 | Ht 65.0 in | Wt 268.0 lb

## 2023-06-28 DIAGNOSIS — R739 Hyperglycemia, unspecified: Secondary | ICD-10-CM

## 2023-06-28 DIAGNOSIS — N898 Other specified noninflammatory disorders of vagina: Secondary | ICD-10-CM

## 2023-06-28 DIAGNOSIS — R350 Frequency of micturition: Secondary | ICD-10-CM

## 2023-06-28 DIAGNOSIS — Z113 Encounter for screening for infections with a predominantly sexual mode of transmission: Secondary | ICD-10-CM | POA: Insufficient documentation

## 2023-06-28 DIAGNOSIS — F419 Anxiety disorder, unspecified: Secondary | ICD-10-CM

## 2023-06-28 LAB — POC URINALSYSI DIPSTICK (AUTOMATED)
Bilirubin, UA: NEGATIVE
Blood, UA: NEGATIVE
Glucose, UA: NEGATIVE
Ketones, UA: NEGATIVE
Leukocytes, UA: NEGATIVE
Nitrite, UA: NEGATIVE
Protein, UA: NEGATIVE
Spec Grav, UA: 1.025 (ref 1.010–1.025)
Urobilinogen, UA: 0.2 E.U./dL
pH, UA: 5 (ref 5.0–8.0)

## 2023-06-28 LAB — BASIC METABOLIC PANEL
BUN: 14 mg/dL (ref 6–23)
CO2: 29 mEq/L (ref 19–32)
Calcium: 9.4 mg/dL (ref 8.4–10.5)
Chloride: 104 mEq/L (ref 96–112)
Creatinine, Ser: 0.83 mg/dL (ref 0.40–1.20)
GFR: 80.1 mL/min (ref >60.00)
Glucose, Bld: 104 mg/dL — ABNORMAL HIGH (ref 70–99)
Potassium: 3.8 mEq/L (ref 3.5–5.1)
Sodium: 140 mEq/L (ref 135–145)

## 2023-06-28 LAB — HEMOGLOBIN A1C: Hgb A1c MFr Bld: 6.4 % (ref 4.6–6.5)

## 2023-06-28 NOTE — Progress Notes (Signed)
Acute Office Visit  Subjective:     Patient ID: Amanda Mcintosh, female    DOB: [birth year], 52 y.o.   MRN: [id number]  Chief Complaint  Patient presents with   vaginal odor    HPI Patient is in today for vaginal odor.   Discussed the use of AI scribe software for clinical note transcription with the patient, who gave verbal consent to proceed.  History of Present Illness   The patient, a 52 year old with a history of arthritis and prediabetes, presents with a week-long history of vaginal odor. She describes the odor as similar to 'meat' or 'cheese,' but denies any associated discharge, itching, or burning. She has a history of bacterial vaginosis, but it has been several years since the last episode. She also reports increased urinary frequency and dark urine.  The patient also reports chronic back pain, which she attributes to sitting for long periods due to her previous job. She is scheduled to start physical therapy for this issue. She also expresses anxiety about driving, which she describes as feeling like a panic attack. She has been prescribed Lexapro for this issue but has been hesitant to start the medication due to concerns about potential side effects.  The patient has been trying to lose weight and has made dietary changes, but has not seen any results. She reports increased thirst and requests a check of her blood sugar levels, as she was previously in the prediabetes range.            ROS All review of systems negative except what is listed in the HPI      Objective:    BP 117/67   Pulse 60   Ht 5\' 5"  (1.651 m)   Wt 268 lb (121.6 kg)   LMP 02/28/2017   SpO2 97%   BMI 44.60 kg/m    Physical Exam Vitals reviewed.  Constitutional:      Appearance: Normal appearance. She is obese.  Cardiovascular:     Rate and Rhythm: Normal rate and regular rhythm.     Pulses: Normal pulses.     Heart sounds: Normal heart sounds.  Pulmonary:     Effort:  Pulmonary effort is normal.     Breath sounds: Normal breath sounds.  Abdominal:     General: There is no distension.     Tenderness: There is no abdominal tenderness. There is no right CVA tenderness, left CVA tenderness or guarding.  Skin:    General: Skin is warm and dry.  Neurological:     Mental Status: She is alert and oriented to person, place, and time.  Psychiatric:        Mood and Affect: Mood normal.        Behavior: Behavior normal.        Thought Content: Thought content normal.        Judgment: Judgment normal.     Results for orders placed or performed in visit on 06/28/23  POCT Urinalysis Dipstick (Automated)  Result Value Ref Range   Color, UA yellow    Clarity, UA clear    Glucose, UA Negative Negative   Bilirubin, UA negative    Ketones, UA negative    Spec Grav, UA 1.025 1.010 - 1.025   Blood, UA negative    pH, UA 5.0 5.0 - 8.0   Protein, UA Negative Negative   Urobilinogen, UA 0.2 0.2 or 1.0 E.U./dL   Nitrite, UA negative    Leukocytes, UA Negative Negative  Assessment & Plan:   Problem List Items Addressed This Visit   None Visit Diagnoses     Urinary frequency    -  Primary Increased Urination: Reports increased frequency of urination and darker urine. No burning or urgency. -Perform urinalysis to rule out urinary tract infection -Negative.   Relevant Orders   Basic metabolic panel   Hemoglobin A1c UA   Hyperglycemia     Increased Urination: Reports increased frequency of urination and darker urine. No burning or urgency. -Perform urinalysis to rule out urinary tract infection. Prediabetes: Previous diagnosis of prediabetes. Reports increased thirst and difficulty losing weight. -Perform blood work to check glucose levels.   Relevant Orders   Hemoglobin A1c   Vaginal odor     Vaginal Odor: New onset of vaginal odor without discharge, itching, or burning. No recent history of bacterial vaginosis. -Perform self-swab for STD  screening. -Collect urine sample for urinalysis.   Relevant Orders   Cervicovaginal ancillary only   Screen for STD (sexually transmitted disease)       Relevant Orders   HIV Antibody (routine testing w rflx)   HSV(herpes simplex vrs) 1+2 ab-IgG   RPR   Cervicovaginal ancillary only      Anxiety: Reports increasing anxiety, particularly around driving. Has been prescribed Lexapro (Escitalopram) but has not started taking it due to concerns about side effects. -Encouraged to start Lexapro as prescribed and monitor for side effects. -Schedule follow-up with prescribing provider Vernona Rieger) in 4-6 weeks to assess efficacy and tolerability of medication.     No orders of the defined types were placed in this encounter.   Return if symptoms worsen or fail to improve, for (follow-up with PCP 4-6 weeks after starting your Lexapro).  Clayborne Dana, NP

## 2023-06-29 LAB — HSV(HERPES SIMPLEX VRS) I + II AB-IGG
HAV 1 IGG,TYPE SPECIFIC AB: 52.8 index — ABNORMAL HIGH
HSV 2 IGG,TYPE SPECIFIC AB: 14.7 index — ABNORMAL HIGH

## 2023-06-29 LAB — HIV ANTIBODY (ROUTINE TESTING W REFLEX): HIV 1&2 Ab, 4th Generation: NONREACTIVE

## 2023-06-29 LAB — RPR: RPR Ser Ql: NONREACTIVE

## 2023-07-01 LAB — CERVICOVAGINAL ANCILLARY ONLY
Bacterial Vaginitis (gardnerella): NEGATIVE
Candida Glabrata: NEGATIVE
Candida Vaginitis: NEGATIVE
Chlamydia: NEGATIVE
Comment: NEGATIVE
Comment: NEGATIVE
Comment: NEGATIVE
Comment: NEGATIVE
Comment: NEGATIVE
Comment: NORMAL
Neisseria Gonorrhea: NEGATIVE
Trichomonas: NEGATIVE

## 2023-07-02 ENCOUNTER — Telehealth: Payer: Self-pay | Admitting: Family

## 2023-07-02 ENCOUNTER — Encounter: Payer: Self-pay | Admitting: Emergency Medicine

## 2023-07-02 ENCOUNTER — Ambulatory Visit
Admission: EM | Admit: 2023-07-02 | Discharge: 2023-07-02 | Disposition: A | Discharge status: Home / Self Care | Payer: Commercial Managed Care - HMO | Attending: Family Medicine | Admitting: Family Medicine

## 2023-07-02 ENCOUNTER — Encounter: Payer: Self-pay | Admitting: Family Medicine

## 2023-07-02 ENCOUNTER — Other Ambulatory Visit: Payer: Self-pay

## 2023-07-02 DIAGNOSIS — F410 Panic disorder [episodic paroxysmal anxiety] without agoraphobia: Secondary | ICD-10-CM | POA: Diagnosis not present

## 2023-07-02 LAB — POCT FASTING CBG KUC MANUAL ENTRY: POCT Glucose (KUC): 109 mg/dL — AB (ref 70–99)

## 2023-07-02 NOTE — Telephone Encounter (Signed)
Patient called and would like to go over some labs. Please call.

## 2023-07-02 NOTE — ED Provider Notes (Signed)
[name]    [name]: [id number] Arrival date & time: 07/02/23  1056      History   Chief Complaint Chief Complaint  Patient presents with   Numbness   Leg Pain    HPI Amanda Mcintosh is a 52 y.o. female.   HPI Patient presents for evaluation of numbness and leg pain x today. Patient reports while on an [employer] train in routine to [place] she began to experience paraesthesia, right lower leg pain, grew concern, got off the train in [place] and took a Pharmacist, community to [employer]. She admits several stressors including loss of employment, several recent deaths, at present her niece died from heart attack and that was the purpose of her trip to attend the funeral. Patient has no history of PE or DVT. She denies any recent injury involving her right leg and is negative of swelling or bruising. She has not eaten any food since yesterday. Denies history of diabetes. Reports PCP recently prescribed her Lexapro for management of anxiety, however she never started medication due to concerns about sides effects.  She reports recent episodes of panic while driving on the expressway with several times requiring her to pull over to the side of road to regain her composure. Reports driving on expressway is new has been present over the last few months. She reports no history of anxiety or panic attacks until this year. She has been receiving counseling though her former employers  employee mental health assistance program although these services are  about the end. She endorses needing to find a new job. Reports that she is uncertain if she experienced a panic attack or not however, she travels routinely and this has never happened before. Denies any symptoms being present now.  Has a brief period on the train of SOB, which has resolved and returned since getting off the train.   Past Medical History:  Diagnosis Date   Fibroids    Heartburn    History of headache    Hyperlipidemia    Hypertension     Obesity    Thyroid condition    Vitamin D deficiency     Patient Active Problem List   Diagnosis Date Noted   Dermatofibroma of left lower leg 06/27/2023   Pre-diabetes 01/16/2023   Other hyperlipidemia 10/17/2022   Vitamin D deficiency 08/28/2022   Prediabetes 08/28/2022   Insulin resistance 08/28/2022   Depression 08/28/2022   Essential hypertension 07/27/2022   Precordial pain 07/27/2022   Family history of CHF (congestive heart failure) 07/27/2022   Palpitations 07/27/2022   DOE (dyspnea on exertion) 07/27/2022   Hyperlipidemia due to dietary fat intake 07/27/2022   Metabolic syndrome 07/27/2022   OSA on CPAP 06/24/2019   Low back pain 04/30/2019   Dizziness 07/04/2017   Chronic seasonal allergic rhinitis 01/25/2017   Chronic sinusitis 01/25/2017   Lateral epicondylitis 01/13/2016   Amenorrhea 06/28/2015   Situational syncope 05/07/2015   GERD (gastroesophageal reflux disease) 04/21/2015   Routine general medical examination at a health care facility 04/21/2015   Iron deficiency anemia 12/02/2013   Migraine headache without aura 03/11/2013   Family history of early CAD 09/08/2012    Past Surgical History:  Procedure Laterality Date   CHOLECYSTECTOMY  12/31/2004   Pulmonary function tests  09/14/2022   Normal lung volumes and DLCO.  Normal spirometry.   ROBOT ASSISTED MYOMECTOMY  12/31/2009   Stress Echocardiogram  08/2012   Exercise to max HR 164 bpm - 93% MPHR  177 bpm.  Normal BP and HR response.  No electrical or echocardiographic evidence of exercise-induced ischemia.  Normal EF, baseline > 55% with no RWMA.  Peak stress LVEF increased to 65-70% with no RWMA.  LOW RISK.   TONSILLECTOMY     TRANSTHORACIC ECHOCARDIOGRAM  08/15/2022   NORMAL.  EF 55 to 60%.  No RWMA.  Normal diastolic parameters.  Unable to assess PAP and otherwise normal RV.  Normal AOV and MV.  Mildly elevated RAP.   WISDOM TOOTH EXTRACTION      OB History     Gravida  1   Para  1    Term      Preterm      AB      Living         SAB      IAB      Ectopic      Multiple      Live Births               Home Medications    Prior to Admission medications   Medication Sig Start Date End Date Taking? Authorizing Provider  albuterol (VENTOLIN HFA) 108 (90 Base) MCG/ACT inhaler Inhale 1-2 puffs into the lungs every 6 (six) hours as needed for wheezing or shortness of breath. Patient taking differently: Inhale 1-2 puffs into the lungs as needed for wheezing or shortness of breath. 03/15/22   Gustavus Bryant, FNP  AMBULATORY NON FORMULARY MEDICATION Compression knee sleeve for pre-patellar bursitis Dispense 1 pre-patellar bursitis Use as needed 08/01/22   Rodolph Bong, MD  budesonide-formoterol [facility]) 160-4.5 MCG/ACT inhaler Inhale 2 puffs into the lungs 2 (two) times daily. 09/14/22   Hunsucker, Lesia Sago, MD  diclofenac Sodium (VOLTAREN) 1 % GEL Apply 4 g topically 4 (four) times daily. To affected joint. 08/01/22   Rodolph Bong, MD  escitalopram (LEXAPRO) 10 MG tablet Take 1 tablet (10 mg total) by mouth daily. 04/12/23   Olive Bass, FNP  fluticasone (FLONASE) 50 MCG/ACT nasal spray Place 1 spray into both nostrils in the morning and at bedtime. Patient taking differently: Place 1 spray into both nostrils as needed. 04/03/22   Kommor, Madison, MD  tiZANidine (ZANAFLEX) 4 MG tablet TAKE 1 TABLET (4 MG TOTAL) BY MOUTH EVERY 8 (EIGHT) HOURS AS NEEDED FOR MUSCLE SPASMS 11/08/22   Rodolph Bong, MD  valsartan-hydrochlorothiazide (DIOVAN-HCT) 160-25 MG tablet Take 1 tablet by mouth daily. 04/12/23   Olive Bass, FNP    Family History Family History  Problem Relation Age of Onset   Diabetes Mother    Hypertension Mother    Heart failure Mother    Stroke Mother    High Cholesterol Mother    Depression Mother    Anxiety disorder Mother    Sleep apnea Mother    Obesity Mother    Obstructive Sleep Apnea Mother    Coronary artery disease  Mother 56       s/p PCI   Diabetes Mellitus II Mother    Heart attack Mother 61   COPD Father        smoker   High blood pressure Father    High Cholesterol Father    Breast cancer Sister    Skin cancer Sister    HIV/AIDS Brother    Heart disease Brother 74       "enlarged heart" = presented with Acute Resp Failure   Hyperlipidemia Other    Migraines Neg Hx  Social History Social History   Tobacco Use   Smoking status: Former    Packs/day: .3    Types: Cigarettes    Quit date: 02/27/2021    Years since quitting: 2.3    Passive exposure: Past   Smokeless tobacco: Never   Tobacco comments:    Quit in Feb 27 2022. Tay 07/26/22  Substance Use Topics   Alcohol use: No   Drug use: No     Allergies   Gadolinium derivatives  Review of Systems Review of Systems Pertinent negatives listed in HPI  Physical Exam Triage Vital Signs ED Triage Vitals  Enc Vitals Group     BP 07/02/23 1109 107/66     Pulse Rate 07/02/23 1109 74     Resp 07/02/23 1109 18     Temp 07/02/23 1109 98 F (36.7 C)     Temp Source 07/02/23 1109 Oral     SpO2 07/02/23 1109 96 %     Weight --      Height --      Head Circumference --      Peak Flow --      Pain Score 07/02/23 1110 4     Pain Loc --      Pain Edu? --      Excl. in GC? --    No data found.  Updated Vital Signs BP 107/66 (BP Location: Left Arm)   Pulse 74   Temp 98 F (36.7 C) (Oral)   Resp 18   LMP 02/28/2017   SpO2 96%   Visual Acuity Right Eye Distance:   Left Eye Distance:   Bilateral Distance:    Right Eye Near:   Left Eye Near:    Bilateral Near:     Physical Exam Vitals reviewed.  Constitutional:      Appearance: Normal appearance. She is obese.  HENT:     Head: Normocephalic and atraumatic.  Eyes:     Extraocular Movements: Extraocular movements intact.     Conjunctiva/sclera: Conjunctivae normal.     Pupils: Pupils are equal, round, and reactive to light.  Cardiovascular:     Rate and Rhythm:  Normal rate and regular rhythm.     Pulses: Normal pulses.     Heart sounds: Normal heart sounds.  Pulmonary:     Effort: Pulmonary effort is normal.     Breath sounds: Normal breath sounds.  Musculoskeletal:     Cervical back: Normal range of motion and neck supple.     Right lower leg: No edema.     Left lower leg: No edema.  Lymphadenopathy:     Cervical: No cervical adenopathy.  Skin:    Findings: No bruising or erythema.  Neurological:     General: No focal deficit present.     Mental Status: She is alert.      UC Treatments / Results  Labs (all labs ordered are listed, but only abnormal results are displayed) Labs Reviewed  POCT FASTING CBG [facility] MANUAL ENTRY - Abnormal; Notable for the following components:      Result Value   POCT Glucose ([facility]) 109 (*)    All other components within normal limits    EKG   Radiology No results found.  Procedures Procedures (including critical care time)  Medications Ordered in UC Medications - No data to display  Initial Impression / Assessment and Plan / UC Course  I have reviewed the triage vital signs and the nursing notes.  Pertinent labs & imaging  results that were available during my care of the patient were reviewed by me and considered in my medical decision making (see chart for details).    Patient here today with abrupt onset of symptoms that resolved after getting off a train. Patient has no clinical findings concerning for a DVT and has low  risk factors given that she is negative for any recent extended periods immobility, no OCP, and no lower extremity swelling, bruising, or erythema.  Checked blood glucose given dizziness , blood sugar stable at 109. Encouraged food intake. Strict ED precautions given. Patient verbalized understanding and agreement with plan. Final Clinical Impressions(s) / UC Diagnoses   Final diagnoses:  Panic attacks   Discharge Instructions   None    ED Prescriptions   None     PDMP not reviewed this encounter.   Bing Neighbors, NP 07/08/23 0005

## 2023-07-02 NOTE — ED Triage Notes (Signed)
Pt sts got on train this am to head to [place] got off in [place] and came home because pt sts feeling numbness in right arm and pain in right leg starting 20 min prior to leaving; pt sts felt SOB on train which is now better; pt also sts numbness in hand is improved

## 2023-07-03 NOTE — Telephone Encounter (Signed)
Please see result note, pt spoke with [name] via Mychart.

## 2023-07-07 NOTE — ED Provider Notes (Incomplete)
[name]    [name]: [id number] Arrival date & time: 07/02/23  1056      History   Chief Complaint Chief Complaint  Patient presents with  . Numbness  . Leg Pain    HPI Amanda Mcintosh is a 52 y.o. female.   HPI Patient presents for evaluation of numbness and leg pain x today. Patient reports while on an [employer] train in routine to [place] she began to experience paraesthesia, right lower leg pain, grew concern, got off the train in [place] and took a Pharmacist, community to [employer]. She admits several stressors including loss of employment, several recent deaths, at present her niece died from heart attack and that was the purpose of her trip to attend the funeral. Patient has no history of PE or DVT. She denies any recent injury involving her right leg and is negative of swelling or bruising. She has not eaten any food since yesterday. Denies history of diabetes. Reports PCP recently prescribed her Lexapro for management of anxiety, however she never started medication due to concerns about sides effects.  She reports recent episodes of panic while driving on the expressway with several times requiring her to pull over to the side of road to regain her composure. Reports driving on expressway is new has been present over the last few months. She reports no history of anxiety or panic attacks until this year. She has been receiving counseling though her former employers  employee mental health assistance program although these services are  about the end. She endorses needing to find a new job. Reports that she is uncertain if she experienced a panic attack or not however, she travels routinely and this has never happened before. Denies any symptoms being present now.  Has a brief period on the train of SOB, which has resolved and returned since getting off the train.   Past Medical History:  Diagnosis Date  . Fibroids   . Heartburn   . History of headache   . Hyperlipidemia   .  Hypertension   . Obesity   . Thyroid condition   . Vitamin D deficiency     Patient Active Problem List   Diagnosis Date Noted  . Dermatofibroma of left lower leg 06/27/2023  . Pre-diabetes 01/16/2023  . Other hyperlipidemia 10/17/2022  . Vitamin D deficiency 08/28/2022  . Prediabetes 08/28/2022  . Insulin resistance 08/28/2022  . Depression 08/28/2022  . Essential hypertension 07/27/2022  . Precordial pain 07/27/2022  . Family history of CHF (congestive heart failure) 07/27/2022  . Palpitations 07/27/2022  . DOE (dyspnea on exertion) 07/27/2022  . Hyperlipidemia due to dietary fat intake 07/27/2022  . Metabolic syndrome 07/27/2022  . OSA on CPAP 06/24/2019  . Low back pain 04/30/2019  . Dizziness 07/04/2017  . Chronic seasonal allergic rhinitis 01/25/2017  . Chronic sinusitis 01/25/2017  . Lateral epicondylitis 01/13/2016  . Amenorrhea 06/28/2015  . Situational syncope 05/07/2015  . GERD (gastroesophageal reflux disease) 04/21/2015  . Routine general medical examination at a health care facility 04/21/2015  . Iron deficiency anemia 12/02/2013  . Migraine headache without aura 03/11/2013  . Family history of early CAD 09/08/2012    Past Surgical History:  Procedure Laterality Date  . CHOLECYSTECTOMY  12/31/2004  . Pulmonary function tests  09/14/2022   Normal lung volumes and DLCO.  Normal spirometry.  . ROBOT ASSISTED MYOMECTOMY  12/31/2009  . Stress Echocardiogram  08/2012   Exercise to max HR 164 bpm - 93% MPHR  177 bpm.  Normal BP and HR response.  No electrical or echocardiographic evidence of exercise-induced ischemia.  Normal EF, baseline > 55% with no RWMA.  Peak stress LVEF increased to 65-70% with no RWMA.  LOW RISK.  . TONSILLECTOMY    . TRANSTHORACIC ECHOCARDIOGRAM  08/15/2022   NORMAL.  EF 55 to 60%.  No RWMA.  Normal diastolic parameters.  Unable to assess PAP and otherwise normal RV.  Normal AOV and MV.  Mildly elevated RAP.  . WISDOM TOOTH EXTRACTION       OB History     Gravida  1   Para  1   Term      Preterm      AB      Living         SAB      IAB      Ectopic      Multiple      Live Births               Home Medications    Prior to Admission medications   Medication Sig Start Date End Date Taking? Authorizing Provider  albuterol (VENTOLIN HFA) 108 (90 Base) MCG/ACT inhaler Inhale 1-2 puffs into the lungs every 6 (six) hours as needed for wheezing or shortness of breath. Patient taking differently: Inhale 1-2 puffs into the lungs as needed for wheezing or shortness of breath. 03/15/22   Gustavus Bryant, FNP  AMBULATORY NON FORMULARY MEDICATION Compression knee sleeve for pre-patellar bursitis Dispense 1 pre-patellar bursitis Use as needed 08/01/22   Rodolph Bong, MD  budesonide-formoterol [facility]) 160-4.5 MCG/ACT inhaler Inhale 2 puffs into the lungs 2 (two) times daily. 09/14/22   Hunsucker, Lesia Sago, MD  diclofenac Sodium (VOLTAREN) 1 % GEL Apply 4 g topically 4 (four) times daily. To affected joint. 08/01/22   Rodolph Bong, MD  escitalopram (LEXAPRO) 10 MG tablet Take 1 tablet (10 mg total) by mouth daily. 04/12/23   Olive Bass, FNP  fluticasone (FLONASE) 50 MCG/ACT nasal spray Place 1 spray into both nostrils in the morning and at bedtime. Patient taking differently: Place 1 spray into both nostrils as needed. 04/03/22   Kommor, Madison, MD  tiZANidine (ZANAFLEX) 4 MG tablet TAKE 1 TABLET (4 MG TOTAL) BY MOUTH EVERY 8 (EIGHT) HOURS AS NEEDED FOR MUSCLE SPASMS 11/08/22   Rodolph Bong, MD  valsartan-hydrochlorothiazide (DIOVAN-HCT) 160-25 MG tablet Take 1 tablet by mouth daily. 04/12/23   Olive Bass, FNP    Family History Family History  Problem Relation Age of Onset  . Diabetes Mother   . Hypertension Mother   . Heart failure Mother   . Stroke Mother   . High Cholesterol Mother   . Depression Mother   . Anxiety disorder Mother   . Sleep apnea Mother   . Obesity Mother   .  Obstructive Sleep Apnea Mother   . Coronary artery disease Mother 74       s/p PCI  . Diabetes Mellitus II Mother   . Heart attack Mother 101  . COPD Father        smoker  . High blood pressure Father   . High Cholesterol Father   . Breast cancer Sister   . Skin cancer Sister   . HIV/AIDS Brother   . Heart disease Brother 42       "enlarged heart" = presented with Acute Resp Failure  . Hyperlipidemia Other   . Migraines Neg Hx  Social History Social History   Tobacco Use  . Smoking status: Former    Packs/day: .3    Types: Cigarettes    Quit date: 02/27/2021    Years since quitting: 2.3    Passive exposure: Past  . Smokeless tobacco: Never  . Tobacco comments:    Quit in Feb 27 2022. Tay 07/26/22  Substance Use Topics  . Alcohol use: No  . Drug use: No     Allergies   Gadolinium derivatives  Review of Systems Review of Systems Pertinent negatives listed in HPI  Physical Exam Triage Vital Signs ED Triage Vitals  Enc Vitals Group     BP 07/02/23 1109 107/66     Pulse Rate 07/02/23 1109 74     Resp 07/02/23 1109 18     Temp 07/02/23 1109 98 F (36.7 C)     Temp Source 07/02/23 1109 Oral     SpO2 07/02/23 1109 96 %     Weight --      Height --      Head Circumference --      Peak Flow --      Pain Score 07/02/23 1110 4     Pain Loc --      Pain Edu? --      Excl. in GC? --    No data found.  Updated Vital Signs BP 107/66 (BP Location: Left Arm)   Pulse 74   Temp 98 F (36.7 C) (Oral)   Resp 18   LMP 02/28/2017   SpO2 96%   Visual Acuity Right Eye Distance:   Left Eye Distance:   Bilateral Distance:    Right Eye Near:   Left Eye Near:    Bilateral Near:     Physical Exam Vitals reviewed.  Constitutional:      Appearance: Normal appearance. She is obese.  HENT:     Head: Normocephalic and atraumatic.  Eyes:     Extraocular Movements: Extraocular movements intact.     Conjunctiva/sclera: Conjunctivae normal.     Pupils: Pupils are  equal, round, and reactive to light.  Cardiovascular:     Rate and Rhythm: Normal rate and regular rhythm.     Pulses: Normal pulses.     Heart sounds: Normal heart sounds.  Pulmonary:     Effort: Pulmonary effort is normal.     Breath sounds: Normal breath sounds.  Musculoskeletal:     Cervical back: Normal range of motion and neck supple.     Right lower leg: No edema.     Left lower leg: No edema.  Lymphadenopathy:     Cervical: No cervical adenopathy.  Skin:    Findings: No bruising or erythema.  Neurological:     General: No focal deficit present.     Mental Status: She is alert.      UC Treatments / Results  Labs (all labs ordered are listed, but only abnormal results are displayed) Labs Reviewed  POCT FASTING CBG [facility] MANUAL ENTRY - Abnormal; Notable for the following components:      Result Value   POCT Glucose ([facility]) 109 (*)    All other components within normal limits    EKG   Radiology No results found.  Procedures Procedures (including critical care time)  Medications Ordered in UC Medications - No data to display  Initial Impression / Assessment and Plan / UC Course  I have reviewed the triage vital signs and the nursing notes.  Pertinent labs & imaging  results that were available during my care of the patient were reviewed by me and considered in my medical decision making (see chart for details).    Patient here today with abrupt onset of symptoms that resolved after getting off a train. Patient has no clinical findings concerning for a DVT and has low  risk factors given that  she has not had any recent extended periods of being sedentary, no OCP, and no lower extremity swelling, bruising, or erythema.  Checked blood surage given dizziness and lack of food inake, es. Strict ED precautions given. Patient verbalized Final Clinical Impressions(s) / UC Diagnoses   Final diagnoses:  Panic attacks   Discharge Instructions   None    ED Prescriptions    None    PDMP not reviewed this encounter.

## 2023-07-09 ENCOUNTER — Ambulatory Visit: Payer: Commercial Managed Care - HMO | Admitting: Family

## 2023-07-09 ENCOUNTER — Encounter: Payer: Self-pay | Admitting: Family

## 2023-07-09 VITALS — BP 136/84 | HR 70 | Ht 65.0 in | Wt 269.0 lb

## 2023-07-09 DIAGNOSIS — F419 Anxiety disorder, unspecified: Secondary | ICD-10-CM

## 2023-07-09 MED ORDER — HYDROXYZINE PAMOATE 25 MG PO CAPS: 25.0000 mg | ORAL_CAPSULE | Freq: Three times a day (TID) | ORAL | 0 refills | Status: DC | PRN

## 2023-07-09 NOTE — Progress Notes (Signed)
Amanda Mcintosh is a 52 y.o. female with the following history as recorded in EpicCare:  Patient Active Problem List   Diagnosis Date Noted   Dermatofibroma of left lower leg 06/27/2023   Pre-diabetes 01/16/2023   Other hyperlipidemia 10/17/2022   Vitamin D deficiency 08/28/2022   Prediabetes 08/28/2022   Insulin resistance 08/28/2022   Depression 08/28/2022   Essential hypertension 07/27/2022   Precordial pain 07/27/2022   Family history of CHF (congestive heart failure) 07/27/2022   Palpitations 07/27/2022   DOE (dyspnea on exertion) 07/27/2022   Hyperlipidemia due to dietary fat intake 07/27/2022   Metabolic syndrome 07/27/2022   OSA on CPAP 06/24/2019   Low back pain 04/30/2019   Dizziness 07/04/2017   Chronic seasonal allergic rhinitis 01/25/2017   Chronic sinusitis 01/25/2017   Lateral epicondylitis 01/13/2016   Amenorrhea 06/28/2015   Situational syncope 05/07/2015   GERD (gastroesophageal reflux disease) 04/21/2015   Routine general medical examination at a health care facility 04/21/2015   Iron deficiency anemia 12/02/2013   Migraine headache without aura 03/11/2013   Family history of early CAD 09/08/2012    Current Outpatient Medications  Medication Sig Dispense Refill   albuterol (VENTOLIN HFA) 108 (90 Base) MCG/ACT inhaler Inhale 1-2 puffs into the lungs every 6 (six) hours as needed for wheezing or shortness of breath. (Patient taking differently: Inhale 1-2 puffs into the lungs as needed for wheezing or shortness of breath.) 1 each 0   AMBULATORY NON FORMULARY MEDICATION Compression knee sleeve for pre-patellar bursitis Dispense 1 pre-patellar bursitis Use as needed 1 Units 0   budesonide-formoterol (SYMBICORT) 160-4.5 MCG/ACT inhaler Inhale 2 puffs into the lungs 2 (two) times daily. 1 each 3   diclofenac Sodium (VOLTAREN) 1 % GEL Apply 4 g topically 4 (four) times daily. To affected joint. 100 g 11   fluticasone (FLONASE) 50 MCG/ACT nasal spray Place 1  spray into both nostrils in the morning and at bedtime. (Patient taking differently: Place 1 spray into both nostrils as needed.) 9.9 mL 2   hydrOXYzine (VISTARIL) 25 MG capsule Take 1 capsule (25 mg total) by mouth every 8 (eight) hours as needed for anxiety. 30 capsule 0   tiZANidine (ZANAFLEX) 4 MG tablet TAKE 1 TABLET (4 MG TOTAL) BY MOUTH EVERY 8 (EIGHT) HOURS AS NEEDED FOR MUSCLE SPASMS 90 tablet 1   valsartan-hydrochlorothiazide (DIOVAN-HCT) 160-25 MG tablet Take 1 tablet by mouth daily. 90 tablet 3   Current Facility-Administered Medications  Medication Dose Route Frequency Provider Last Rate Last Admin   diclofenac Sodium (VOLTAREN) 1 % topical gel 4 g  4 g Topical QID Rodolph Bong, MD        Allergies: Gadolinium derivatives  Past Medical History:  Diagnosis Date   Fibroids    Heartburn    History of headache    Hyperlipidemia    Hypertension    Obesity    Thyroid condition    Vitamin D deficiency     Past Surgical History:  Procedure Laterality Date   CHOLECYSTECTOMY  12/31/2004   Pulmonary function tests  09/14/2022   Normal lung volumes and DLCO.  Normal spirometry.   ROBOT ASSISTED MYOMECTOMY  12/31/2009   Stress Echocardiogram  08/2012   Exercise to max HR 164 bpm - 93% MPHR 177 bpm.  Normal BP and HR response.  No electrical or echocardiographic evidence of exercise-induced ischemia.  Normal EF, baseline > 55% with no RWMA.  Peak stress LVEF increased to 65-70% with no RWMA.  LOW RISK.  TONSILLECTOMY     TRANSTHORACIC ECHOCARDIOGRAM  08/15/2022   NORMAL.  EF 55 to 60%.  No RWMA.  Normal diastolic parameters.  Unable to assess PAP and otherwise normal RV.  Normal AOV and MV.  Mildly elevated RAP.   WISDOM TOOTH EXTRACTION      Family History  Problem Relation Age of Onset   Diabetes Mother    Hypertension Mother    Heart failure Mother    Stroke Mother    High Cholesterol Mother    Depression Mother    Anxiety disorder Mother    Sleep apnea Mother     Obesity Mother    Obstructive Sleep Apnea Mother    Coronary artery disease Mother 50       s/p PCI   Diabetes Mellitus II Mother    Heart attack Mother 29   COPD Father        smoker   High blood pressure Father    High Cholesterol Father    Breast cancer Sister    Skin cancer Sister    HIV/AIDS Brother    Heart disease Brother 86       "enlarged heart" = presented with Acute Resp Failure   Hyperlipidemia Other    Migraines Neg Hx     Social History   Tobacco Use   Smoking status: Former    Packs/day: .3    Types: Cigarettes    Quit date: 02/27/2021    Years since quitting: 2.3    Passive exposure: Past   Smokeless tobacco: Never   Tobacco comments:    Quit in Feb 27 2022. Tay 07/26/22  Substance Use Topics   Alcohol use: No    Subjective:    Patient was seen at [facility] on [facility]/2 with concerns for panic attack; has had history of anxiety "for years" but feels like symptoms are worsening; has been hesitant to take any medication previously but opted to try older prescription for Lexapro- felt like that medication actually made her symptoms worse; prefers to try medication as needed as opposed to take a daily medication;     Objective:  Vitals:   07/09/23 1353  BP: 136/84  Pulse: 70  SpO2: 98%  Weight: 269 lb (122 kg)  Height: 5\' 5"  (1.651 m)    General: Well developed, well nourished, in no acute distress  Skin : Warm and dry.  Head: Normocephalic and atraumatic  Eyes: Sclera and conjunctiva clear; pupils round and reactive to light; extraocular movements intact  Ears: External normal; canals clear; tympanic membranes normal  Oropharynx: Pink, supple. No suspicious lesions  Neck: Supple without thyromegaly, adenopathy  Lungs: Respirations unlabored; clear to auscultation bilaterally without wheeze, rales, rhonchi  Neurologic: Alert and oriented; speech intact; face symmetrical; moves all extremities well; CNII-XII intact without focal deficit   Assessment:  1.  Anxiety     Plan:  Will give trial of Hydroxyzine 25 mg tid prn; she does not want daily medication; referral to therapist; reviewed medications that were drawn 2 weeks ago- reassuring results; follow up prn;  Time spent 30 minutes  No follow-ups on file.  Orders Placed This Encounter  Procedures   Ambulatory referral to Behavioral Health    Referral Priority:   Routine    Referral Type:   Psychiatric    Referral Reason:   Specialty Services Required    Requested Specialty:   Behavioral Health    Number of Visits Requested:   1    Requested Prescriptions  Signed Prescriptions Disp Refills   hydrOXYzine (VISTARIL) 25 MG capsule 30 capsule 0    Sig: Take 1 capsule (25 mg total) by mouth every 8 (eight) hours as needed for anxiety.

## 2023-07-17 ENCOUNTER — Ambulatory Visit: Payer: Medicaid Other

## 2023-07-17 ENCOUNTER — Encounter: Payer: Self-pay | Admitting: Physical Therapy

## 2023-07-17 ENCOUNTER — Ambulatory Visit: Payer: Commercial Managed Care - HMO | Attending: Family Medicine | Admitting: Physical Therapy

## 2023-07-17 ENCOUNTER — Other Ambulatory Visit: Payer: Self-pay

## 2023-07-17 DIAGNOSIS — M7061 Trochanteric bursitis, right hip: Secondary | ICD-10-CM | POA: Insufficient documentation

## 2023-07-17 DIAGNOSIS — M6281 Muscle weakness (generalized): Secondary | ICD-10-CM | POA: Insufficient documentation

## 2023-07-17 DIAGNOSIS — M25551 Pain in right hip: Secondary | ICD-10-CM | POA: Insufficient documentation

## 2023-07-17 DIAGNOSIS — K5900 Constipation, unspecified: Secondary | ICD-10-CM | POA: Diagnosis not present

## 2023-07-17 NOTE — Patient Instructions (Signed)
Access Code: [email or web address] URL: [email or web address] Date: 07/17/2023 Prepared by: Rosana Hoes  Exercises - Bridge with Resistance  - 1 x daily - 3 sets - 10 reps - Clam with Resistance  - 1 x daily - 3 sets - 15 reps - Squat with Chair Touch  - 1 x daily - 3 sets - 10 reps

## 2023-07-17 NOTE — Therapy (Signed)
OUTPATIENT PHYSICAL THERAPY EVALUATION   Patient Name: Amanda Mcintosh MRN: [id number] DOB:1971-04-10, 52 y.o., female Today's Date: 07/17/2023   END OF SESSION:  PT End of Session - 07/17/23 0941     Visit Number 1    Number of Visits 9    Date for PT Re-Evaluation 09/11/23    Authorization Type [employer]    Authorization - Number of Visits 27    PT Start Time 0930    PT Stop Time 1015    PT Time Calculation (min) 45 min    Activity Tolerance Patient tolerated treatment well    Behavior During Therapy [facility] for tasks assessed/performed             Past Medical History:  Diagnosis Date   Fibroids    Heartburn    History of headache    Hyperlipidemia    Hypertension    Obesity    Thyroid condition    Vitamin D deficiency    Past Surgical History:  Procedure Laterality Date   CHOLECYSTECTOMY  12/31/2004   Pulmonary function tests  09/14/2022   Normal lung volumes and DLCO.  Normal spirometry.   ROBOT ASSISTED MYOMECTOMY  12/31/2009   Stress Echocardiogram  08/2012   Exercise to max HR 164 bpm - 93% MPHR 177 bpm.  Normal BP and HR response.  No electrical or echocardiographic evidence of exercise-induced ischemia.  Normal EF, baseline > 55% with no RWMA.  Peak stress LVEF increased to 65-70% with no RWMA.  LOW RISK.   TONSILLECTOMY     TRANSTHORACIC ECHOCARDIOGRAM  08/15/2022   NORMAL.  EF 55 to 60%.  No RWMA.  Normal diastolic parameters.  Unable to assess PAP and otherwise normal RV.  Normal AOV and MV.  Mildly elevated RAP.   WISDOM TOOTH EXTRACTION     Patient Active Problem List   Diagnosis Date Noted   Dermatofibroma of left lower leg 06/27/2023   Pre-diabetes 01/16/2023   Other hyperlipidemia 10/17/2022   Vitamin D deficiency 08/28/2022   Prediabetes 08/28/2022   Insulin resistance 08/28/2022   Depression 08/28/2022   Essential hypertension 07/27/2022   Precordial pain 07/27/2022   Family history of CHF (congestive heart failure) 07/27/2022    Palpitations 07/27/2022   DOE (dyspnea on exertion) 07/27/2022   Hyperlipidemia due to dietary fat intake 07/27/2022   Metabolic syndrome 07/27/2022   OSA on CPAP 06/24/2019   Low back pain 04/30/2019   Dizziness 07/04/2017   Chronic seasonal allergic rhinitis 01/25/2017   Chronic sinusitis 01/25/2017   Lateral epicondylitis 01/13/2016   Amenorrhea 06/28/2015   Situational syncope 05/07/2015   GERD (gastroesophageal reflux disease) 04/21/2015   Routine general medical examination at a health care facility 04/21/2015   Iron deficiency anemia 12/02/2013   Migraine headache without aura 03/11/2013   Family history of early CAD 09/08/2012    PCP: Olive Bass, FNP  REFERRING PROVIDER: Rodolph Bong, MD  REFERRING DIAG: Trochanteric bursitis, right hip  THERAPY DIAG:  Pain in right hip  Muscle weakness (generalized)  Rationale for Evaluation and Treatment: Rehabilitation  ONSET DATE: Approximately 1 year   SUBJECTIVE:  SUBJECTIVE STATEMENT: Patient reports chronic right hip pain. She states she does have some arthritis and bursitis in the right hip. The pain has gotten worse over the past year. She does have history of chronic low back pain for past 3 years that has gotten a little better. She reports that she will get an occasional pain that can shoot  all the way down her leg. She has been working out recently walking on her treadmill and she has gotten up to 30 minutes, but the will hip will start hurting about 10 minutes in to the walking. She also reports when she is cooking for 5-10 minutes the pain will come on and the muscle will tighten up. The pain will go away once she sits down. She also reports pain when lying on the right side for a few minutes.  PERTINENT HISTORY: Chronic low back pain, see other PMH above  PAIN:  Are you having pain? Yes:  NPRS scale: 2/10 (9/10 when walking/standing >10 minutes) Pain location: Right hip Pain description:  Tight Aggravating factors: Walking Relieving factors: Sitting, standing for extended periods  PRECAUTIONS: None  RED FLAGS: None   WEIGHT BEARING RESTRICTIONS: No  FALLS:  Has patient fallen in last 6 months? No  PLOF: Independent  PATIENT GOALS: Pain relief so she can exercise and lose weight   OBJECTIVE:  PATIENT SURVEYS:  FOTO 49% functional status  COGNITION: Overall cognitive status: Within functional limits for tasks assessed     SENSATION: WFL  MUSCLE LENGTH: Slight limitation bilateral hamstring flexibility  POSTURE:   Increased lumbar lordosis, rounded shoulders  PALPATION: Tender to palpation superior and posterior greater tuberosity  LOWER EXTREMITY ROM:   LE ROM grossly WFL  LOWER EXTREMITY MMT:  MMT Right eval Left eval  Hip flexion 4 4  Hip extension 4- 4-  Hip abduction 3+ 4-  Hip adduction    Hip internal rotation    Hip external rotation    Knee flexion 5 5  Knee extension 5 5  Ankle dorsiflexion    Ankle plantarflexion    Ankle inversion    Ankle eversion     (Blank rows = not tested)  LOWER EXTREMITY SPECIAL TESTS:  Not assessed  FUNCTIONAL TESTS:  Squat: patient will mild knee valgus right > left with weight shift toward left  GAIT: Assistive device utilized: None Level of assistance: Complete Independence Comments: Trendelenburg   TODAY'S TREATMENT:         [facility] Adult PT Treatment:                                                DATE: 07/17/2023 Therapeutic Exercise: Bridge with red at knees 10 x 5 sec Clamshell x 15 Squat to table x 10  PATIENT EDUCATION:  Education details: Exam findings, POC, HEP, iontophoresis Person educated: Patient Education method: Explanation, Demonstration, Tactile cues, Verbal cues, and Handouts Education comprehension: verbalized understanding, returned demonstration, verbal cues required, tactile cues required, and needs further education  HOME EXERCISE PROGRAM: Access Code: [email or web address]     ASSESSMENT: CLINICAL IMPRESSION: Patient is a 52 y.o. female who was seen today for physical therapy evaluation and treatment for chronic right hip pain. Her pain does seem consistent with right gluteal tendinopathy with hip strength deficits that are impacting her functional ability.   OBJECTIVE IMPAIRMENTS: Abnormal gait, decreased activity tolerance, decreased strength, impaired flexibility, improper body mechanics, postural dysfunction, and pain.   ACTIVITY LIMITATIONS: lifting, standing, squatting, and locomotion level  PARTICIPATION LIMITATIONS: meal prep, cleaning, shopping, and community activity  PERSONAL FACTORS: Fitness, Past/current experiences, and Time since onset of injury/illness/exacerbation are also affecting patient's functional outcome.   REHAB POTENTIAL: Good  CLINICAL DECISION MAKING: Stable/uncomplicated  EVALUATION COMPLEXITY: Low  GOALS: Goals reviewed with patient? Yes  SHORT TERM GOALS: Target date: 08/14/2023  Patient will be I with initial HEP in order to progress with therapy. Baseline: HEP provided at eval Goal status: INITIAL  2.  Patient will report right hip pain with walking/standing > 10 minutes </= 4/10 in order to reduce functional limitations Baseline: 9/10 Goal status: INITIAL  3.  Patient will demonstrate proper squat technique to progress hip strengthening and improve LE control Baseline: squat deviations noted above Goal status: INITIAL  LONG TERM GOALS: Target date: 09/11/2023  Patient will be I with final HEP to maintain progress from PT. Baseline: HEP provided at eval Goal status: INITIAL  2.  Patient will report >/= 59% status on FOTO to indicate improved functional ability. Baseline: 49% functional status Goal status: INITIAL  3.  Patient will demonstrate right hip strength >/= 4/5 MMT in order to improve walking and standing tolerance Baseline: see limitations noted above Goal status: INITIAL  4.  Patient will be  able to walk/stand >/= 30 minutes without an increase in pain in order to improve ability to exercise and cook Baseline: patient reports pain with standing or walking 10 minutes Goal status: INITIAL   PLAN: PT FREQUENCY: 1x/week  PT DURATION: 8 weeks  PLANNED INTERVENTIONS: Therapeutic exercises, Therapeutic activity, Neuromuscular re-education, Balance training, Gait training, Patient/Family education, Self Care, Joint mobilization, Aquatic Therapy, Dry Needling, Electrical stimulation, Cryotherapy, Moist heat, Ionotophoresis 4mg /ml Dexamethasone, Manual therapy, and Re-evaluation  PLAN FOR NEXT SESSION: Review HEP and progress PRN, hip strengthening, progress closed chain strengthening and LE control   Rosana Hoes, PT, DPT, LAT, ATC 07/17/23  12:22 PM Phone: [phone] Fax: [phone]

## 2023-07-19 DIAGNOSIS — H5213 Myopia, bilateral: Secondary | ICD-10-CM | POA: Diagnosis not present

## 2023-07-24 DIAGNOSIS — G4733 Obstructive sleep apnea (adult) (pediatric): Secondary | ICD-10-CM | POA: Diagnosis not present

## 2023-07-25 ENCOUNTER — Ambulatory Visit: Payer: Commercial Managed Care - HMO | Admitting: Physical Therapy

## 2023-07-25 ENCOUNTER — Other Ambulatory Visit: Payer: Self-pay

## 2023-07-25 ENCOUNTER — Encounter: Payer: Self-pay | Admitting: Physical Therapy

## 2023-07-25 DIAGNOSIS — M25551 Pain in right hip: Secondary | ICD-10-CM | POA: Diagnosis not present

## 2023-07-25 DIAGNOSIS — M6281 Muscle weakness (generalized): Secondary | ICD-10-CM | POA: Diagnosis not present

## 2023-07-25 DIAGNOSIS — M7061 Trochanteric bursitis, right hip: Secondary | ICD-10-CM | POA: Diagnosis not present

## 2023-07-25 NOTE — Progress Notes (Signed)
Cardiology Clinic Note   Date: 07/26/2023 ID: Amanda Mcintosh, DOB 04/01/1971, MRN [id number]  Primary Cardiologist:  Bryan Lemma, MD  Patient Profile    Amanda Mcintosh is a 52 y.o. female who presents to the clinic today for routine follow up.     Past medical history significant for: Palpitations.  Hypertension.  Hyperlipidemia.  Lipid panel 10/10/2022: LDL 187, HDL 38, TG 140, total 252. Chest pain/DOE.  Echo 08/15/2022: EF 55 to 60%.  Normal diastolic parameters.  Normal RV function.  No significant valvular abnormalities. Coronary CTA 10/11/2022: Coronary calcium score 0, no evidence of CAD. Metabolic syndrome/insulin resistance.  GERD.  OSA.  On CPAP 100% adherence. Migraines. Tobacco abuse. Complete cessation since 2022.     History of Present Illness    Amanda Mcintosh was first evaluated by Dr. Herbie Baltimore on 07/27/2022 for chest pain, shortness of breath, palpitations at the request of [name], NP.  Patient reported intermittent elevated blood pressure with readings greater than 210/108 when she had palpitations and chest tightness which she related to anxiety.  Chest pressure/tightness was described as intermittent and occurring at rest and sometimes with lying down at night but not with exertion.  Episodes happen 1-2 times a week.  She also reported intermittent palpitations described as a fast heart rate occurring several times throughout the day that resolved after about 10 minutes with relaxation.  She reported strong family history of heart disease.  She also reported DOE.  Her echo was normal.  Patient was last seen in the office by Dr. Herbie Baltimore on 10/10/2022 for follow-up.  She continued to report intermittent episodes of chest pain and DOE.  She was under an increased amount of stress secondary to being laid off, her husband being diagnosed with cancer, and her brother passing away.  Coronary CTA showed calcium score of 0 with no evidence of  CAD.  Patient presented to urgent care on 07/02/2023 with complaints of paresthesia, right lower leg pain.  She was on a train on her way to [place] for a funeral when symptoms began and decided to get off the train and [place] return to the area.  She reported several stressors including loss of employment, several recent deaths and most recently the death of her niece who died of a heart attack.  It was felt her symptoms were due to a panic attack.  She was provided with ED precautions.  Today, patient reports she has been doing well since being seen in urgent care. She denies shortness of breath or dyspnea on exertion. No chest pain, pressure, or tightness. Denies lower extremity edema, orthopnea, or PND. No palpitations.   She has started a treadmill walking program for 1 hour each day. She denies calf pain but reports some intermittent discomfort down from of right leg. Discussed the possibility of shin splints given her increased walking over the last 4 weeks. Suggested getting a gait analysis for better shoe support to see if that helps. She is working on improving her diet. She eats limited beef/pork and a lot of seafood. She also limits her carbs. She tried taking Crestor as prescribed by Dr. Herbie Baltimore but had severe myalgia so she stopped it. She quit smoking completely 2 years ago. She is managing panic attacks/anxiety with her PCP.     ROS: All other systems reviewed and are otherwise negative except as noted in History of Present Illness.  Studies Reviewed    EKG Interpretation Date/Time:  Friday July 26 2023 09:11:53 EDT Ventricular Rate:  63 PR Interval:  154 QRS Duration:  90 QT Interval:  396 QTC Calculation: 405 R Axis:   2  Text Interpretation: Normal sinus rhythm Low voltage QRS Cannot rule out Anterior infarct , age undetermined When compared with ECG of 03-Apr-2022 12:27, QRS axis Shifted right QRS voltage has decreased Confirmed by Carlos Levering [phone]) on  07/26/2023 9:19:38 AM           Physical Exam    VS:  BP 112/70 (BP Location: Left Arm, Patient Position: Sitting, Cuff Size: Normal)   Pulse 76   Ht 5\' 5"  (1.651 m)   Wt 268 lb 12.8 oz (121.9 kg)   LMP 02/28/2017   SpO2 95%   BMI 44.73 kg/m  , BMI Body mass index is 44.73 kg/m.  GEN: Well nourished, well developed, in no acute distress. Neck: No JVD or carotid bruits. Cardiac:  RRR. No murmurs. No rubs or gallops.   Respiratory:  Respirations regular and unlabored. Clear to auscultation without rales, wheezing or rhonchi. GI: Soft, nontender, nondistended. Extremities: Radials/DP/PT 2+ and equal bilaterally. No clubbing or cyanosis. No edema.  Skin: Warm and dry, no rash. Neuro: Strength intact.  Assessment & Plan    Hypertension: BP today 112/70. Patient denies headaches, dizziness or vision changes. Continue Diovan. Hyperlipidemia.  LDL October 2023 187, not at goal. Patient had severe myalgias with Crestor. She would like to continue her lifestyle changes of increased exercise and limited beef/pork for another couple of months before checking her cholesterol. I think this is reasonable. Will place order for fasting lipid panel to be done in October.   Disposition: Lipid panel in October. Return in 6 months or sooner as needed.          Signed, Etta Grandchild. Johny Pitstick, DNP, NP-C

## 2023-07-25 NOTE — Therapy (Signed)
OUTPATIENT PHYSICAL THERAPY TREATMENT   Patient Name: Amanda Mcintosh MRN: [id number] DOB:[birth year], 52 y.o., female Today's Date: 07/25/2023   END OF SESSION:  PT End of Session - 07/25/23 1458     Visit Number 2    Number of Visits 9    Date for PT Re-Evaluation 09/11/23    Authorization Type [employer]    Authorization - Number of Visits 27    PT Start Time 1445    PT Stop Time 1530    PT Time Calculation (min) 45 min    Activity Tolerance Patient tolerated treatment well    Behavior During Therapy [facility] for tasks assessed/performed              Past Medical History:  Diagnosis Date   Fibroids    Heartburn    History of headache    Hyperlipidemia    Hypertension    Obesity    Thyroid condition    Vitamin D deficiency    Past Surgical History:  Procedure Laterality Date   CHOLECYSTECTOMY  12/31/2004   Pulmonary function tests  09/14/2022   Normal lung volumes and DLCO.  Normal spirometry.   ROBOT ASSISTED MYOMECTOMY  12/31/2009   Stress Echocardiogram  08/2012   Exercise to max HR 164 bpm - 93% MPHR 177 bpm.  Normal BP and HR response.  No electrical or echocardiographic evidence of exercise-induced ischemia.  Normal EF, baseline > 55% with no RWMA.  Peak stress LVEF increased to 65-70% with no RWMA.  LOW RISK.   TONSILLECTOMY     TRANSTHORACIC ECHOCARDIOGRAM  08/15/2022   NORMAL.  EF 55 to 60%.  No RWMA.  Normal diastolic parameters.  Unable to assess PAP and otherwise normal RV.  Normal AOV and MV.  Mildly elevated RAP.   WISDOM TOOTH EXTRACTION     Patient Active Problem List   Diagnosis Date Noted   Dermatofibroma of left lower leg 06/27/2023   Pre-diabetes 01/16/2023   Other hyperlipidemia 10/17/2022   Vitamin D deficiency 08/28/2022   Prediabetes 08/28/2022   Insulin resistance 08/28/2022   Depression 08/28/2022   Essential hypertension 07/27/2022   Precordial pain 07/27/2022   Family history of CHF (congestive heart failure) 07/27/2022    Palpitations 07/27/2022   DOE (dyspnea on exertion) 07/27/2022   Hyperlipidemia due to dietary fat intake 07/27/2022   Metabolic syndrome 07/27/2022   OSA on CPAP 06/24/2019   Low back pain 04/30/2019   Dizziness 07/04/2017   Chronic seasonal allergic rhinitis 01/25/2017   Chronic sinusitis 01/25/2017   Lateral epicondylitis 01/13/2016   Amenorrhea 06/28/2015   Situational syncope 05/07/2015   GERD (gastroesophageal reflux disease) 04/21/2015   Routine general medical examination at a health care facility 04/21/2015   Iron deficiency anemia 12/02/2013   Migraine headache without aura 03/11/2013   Family history of early CAD 09/08/2012    PCP: Olive Bass, FNP  REFERRING PROVIDER: Rodolph Bong, MD  REFERRING DIAG: Trochanteric bursitis, right hip  THERAPY DIAG:  Pain in right hip  Muscle weakness (generalized)  Rationale for Evaluation and Treatment: Rehabilitation  ONSET DATE: Approximately 1 year   SUBJECTIVE:  SUBJECTIVE STATEMENT: Patient reports her right hip continues to bother and tightens up after she is walking a few minutes on the treadmill. She states she is up to an hour walking on the treadmill. Her hip does feel more aggravated at the moment and then sitting will help to alleviate the pain.  PAIN:  Are you having pain?  Yes:  NPRS scale: 5/10 (9/10 when walking/standing >10 minutes) Pain location: Right hip Pain description: Tight Aggravating factors: Walking Relieving factors: Sitting, standing for extended periods  PERTINENT HISTORY: Chronic low back pain, see other PMH above  PRECAUTIONS: None  PATIENT GOALS: Pain relief so she can exercise and lose weight   OBJECTIVE:  PATIENT SURVEYS:  FOTO 49% functional status  MUSCLE LENGTH: Slight limitation bilateral hamstring flexibility  POSTURE:   Increased lumbar lordosis, rounded shoulders  PALPATION: Tender to palpation superior and posterior greater tuberosity  LOWER  EXTREMITY MMT:  MMT Right eval Left eval Right 07/24/2023  Hip flexion 4 4   Hip extension 4- 4-   Hip abduction 3+ 4- 3+  Hip adduction     Hip internal rotation     Hip external rotation     Knee flexion 5 5   Knee extension 5 5   Ankle dorsiflexion     Ankle plantarflexion     Ankle inversion     Ankle eversion      (Blank rows = not tested)  FUNCTIONAL TESTS:  Squat: patient will mild knee valgus right > left with weight shift toward left  GAIT: Assistive device utilized: None Level of assistance: Complete Independence Comments: Trendelenburg   TODAY'S TREATMENT:         [facility] Adult PT Treatment:                                                DATE: 07/25/2023 Therapeutic Exercise: NuStep L6 x 5 min with LE while taking subjective Side clamshell with red x 15, green 2 x 15 Bridge with green at knees 2 x 10 Standing hip hike 3 x 10 on right  SLS with focus on level pelvis 3 x 30 sec Squat to table 2 x 10 Modalities: Iontophoresis: extended release patch x 4 hours, 49mA-min, 1 mL dexamethasone   [facility] Adult PT Treatment:                                                DATE: 07/17/2023 Therapeutic Exercise: Bridge with red at knees 10 x 5 sec Clamshell x 15 Squat to table x 10  PATIENT EDUCATION:  Education details: HEP, iontophoresis instruction, breaking her walking up in to intervals Person educated: Patient Education method: Explanation, Demonstration, Tactile cues, Verbal cues Education comprehension: verbalized understanding, returned demonstration, verbal cues required, tactile cues required, and needs further education  HOME EXERCISE PROGRAM: Access Code: [email or web address]    ASSESSMENT: CLINICAL IMPRESSION: Patient tolerated therapy well with no adverse effects. Therapy focused on progressing hip strength and endurance with pelvic control. She does continue to report her right hip with start to tighten up with about 10 minutes of walking, and she demonstrates  right hip abductor strength deficit. She was able to progress resistance with hip strengthening and worked on strengthening and endurance in closed chain position this visit. Utilized iontophoresis to the glute med tendon this visit. No changes to HEP but patient provided with green band to progress strengthening at home. Patient would benefit from continued skilled PT to progress her strength in order to reduce pain and maximize functional ability.    OBJECTIVE IMPAIRMENTS: Abnormal gait, decreased activity tolerance,  decreased strength, impaired flexibility, improper body mechanics, postural dysfunction, and pain.   ACTIVITY LIMITATIONS: lifting, standing, squatting, and locomotion level  PARTICIPATION LIMITATIONS: meal prep, cleaning, shopping, and community activity  PERSONAL FACTORS: Fitness, Past/current experiences, and Time since onset of injury/illness/exacerbation are also affecting patient's functional outcome.    GOALS: Goals reviewed with patient? Yes  SHORT TERM GOALS: Target date: 08/14/2023  Patient will be I with initial HEP in order to progress with therapy. Baseline: HEP provided at eval Goal status: INITIAL  2.  Patient will report right hip pain with walking/standing > 10 minutes </= 4/10 in order to reduce functional limitations Baseline: 9/10 Goal status: INITIAL  3.  Patient will demonstrate proper squat technique to progress hip strengthening and improve LE control Baseline: squat deviations noted above Goal status: INITIAL  LONG TERM GOALS: Target date: 09/11/2023  Patient will be I with final HEP to maintain progress from PT. Baseline: HEP provided at eval Goal status: INITIAL  2.  Patient will report >/= 59% status on FOTO to indicate improved functional ability. Baseline: 49% functional status Goal status: INITIAL  3.  Patient will demonstrate right hip strength >/= 4/5 MMT in order to improve walking and standing tolerance Baseline: see limitations  noted above Goal status: INITIAL  4.  Patient will be able to walk/stand >/= 30 minutes without an increase in pain in order to improve ability to exercise and cook Baseline: patient reports pain with standing or walking 10 minutes Goal status: INITIAL   PLAN: PT FREQUENCY: 1x/week  PT DURATION: 8 weeks  PLANNED INTERVENTIONS: Therapeutic exercises, Therapeutic activity, Neuromuscular re-education, Balance training, Gait training, Patient/Family education, Self Care, Joint mobilization, Aquatic Therapy, Dry Needling, Electrical stimulation, Cryotherapy, Moist heat, Ionotophoresis 4mg /ml Dexamethasone, Manual therapy, and Re-evaluation  PLAN FOR NEXT SESSION: Review HEP and progress PRN, hip strengthening, progress closed chain strengthening and LE control   Rosana Hoes, PT, DPT, LAT, ATC 07/25/23  3:46 PM Phone: [phone] Fax: [phone]

## 2023-07-26 ENCOUNTER — Encounter: Payer: Self-pay | Admitting: Student

## 2023-07-26 ENCOUNTER — Ambulatory Visit: Payer: Medicaid Other | Attending: Student | Admitting: Student

## 2023-07-26 VITALS — BP 112/70 | HR 76 | Ht 65.0 in | Wt 268.8 lb

## 2023-07-26 DIAGNOSIS — I10 Essential (primary) hypertension: Secondary | ICD-10-CM | POA: Diagnosis not present

## 2023-07-26 DIAGNOSIS — E7849 Other hyperlipidemia: Secondary | ICD-10-CM | POA: Diagnosis not present

## 2023-07-26 NOTE — Patient Instructions (Addendum)
Medication Instructions:  No changes *If you need a refill on your cardiac medications before your next appointment, please call your pharmacy*   Return for fasting labs Please return for FASTING labs (Lipid panel)  Our in office lab hours are Monday-Friday 8:00-4:00, closed for lunch 12:45-1:45 pm.  No appointment needed.  [facility] locations:   [facility] - [address]  - [address] ([facility]) - [address] [phone] [address]. [address]   [facility] - [address]    [place]  - [address]    [place] - [address] - [address] [employer]  - [address] - [address] Chief Technology Officer)  If you have labs (blood work) drawn today and your tests are completely normal, you will receive your results only by: [employer] (if you have MyChart) OR A paper copy in the mail If you have any lab test that is abnormal or we need to change your treatment, we will call you to review the results.   Testing/Procedures: No changes   Follow-Up: At [facility], you and your health needs are our priority.  As part of our continuing mission to provide you with exceptional heart care, we have created designated Provider Care Teams.  These Care Teams include your primary Cardiologist (physician) and Advanced Practice Providers (APPs -  Physician Assistants and Nurse Practitioners) who all work together to provide you with the care you need, when you need it.  We recommend signing up for the patient portal called "MyChart".  Sign up information is provided on this After Visit Summary.  MyChart is used to connect with patients for Virtual Visits (Telemedicine).  Patients are able to view lab/test results, encounter notes, upcoming appointments, etc.  Non-urgent messages can be sent to your provider as well.   To learn more about what you can do  with MyChart, go to [email or web address].    Your next appointment:   6 month(s)  Provider:   Bryan Lemma, MD

## 2023-07-29 NOTE — Therapy (Signed)
OUTPATIENT PHYSICAL THERAPY TREATMENT   Patient Name: Amanda Mcintosh MRN: [id number] DOB:12/20/1971, 52 y.o., female Today's Date: 07/30/2023   END OF SESSION:  PT End of Session - 07/30/23 1416     Visit Number 3    Number of Visits 9    Date for PT Re-Evaluation 09/11/23    Authorization Type [employer]    Authorization - Number of Visits 27    PT Start Time 1445    PT Stop Time 1527    PT Time Calculation (min) 42 min    Activity Tolerance Patient tolerated treatment well    Behavior During Therapy [facility] for tasks assessed/performed               Past Medical History:  Diagnosis Date   Fibroids    Heartburn    History of headache    Hyperlipidemia    Hypertension    Obesity    Thyroid condition    Vitamin D deficiency    Past Surgical History:  Procedure Laterality Date   CHOLECYSTECTOMY  12/31/2004   Pulmonary function tests  09/14/2022   Normal lung volumes and DLCO.  Normal spirometry.   ROBOT ASSISTED MYOMECTOMY  12/31/2009   Stress Echocardiogram  08/2012   Exercise to max HR 164 bpm - 93% MPHR 177 bpm.  Normal BP and HR response.  No electrical or echocardiographic evidence of exercise-induced ischemia.  Normal EF, baseline > 55% with no RWMA.  Peak stress LVEF increased to 65-70% with no RWMA.  LOW RISK.   TONSILLECTOMY     TRANSTHORACIC ECHOCARDIOGRAM  08/15/2022   NORMAL.  EF 55 to 60%.  No RWMA.  Normal diastolic parameters.  Unable to assess PAP and otherwise normal RV.  Normal AOV and MV.  Mildly elevated RAP.   WISDOM TOOTH EXTRACTION     Patient Active Problem List   Diagnosis Date Noted   Dermatofibroma of left lower leg 06/27/2023   Pre-diabetes 01/16/2023   Other hyperlipidemia 10/17/2022   Vitamin D deficiency 08/28/2022   Prediabetes 08/28/2022   Insulin resistance 08/28/2022   Depression 08/28/2022   Essential hypertension 07/27/2022   Precordial pain 07/27/2022   Family history of CHF (congestive heart failure)  07/27/2022   Palpitations 07/27/2022   DOE (dyspnea on exertion) 07/27/2022   Hyperlipidemia due to dietary fat intake 07/27/2022   Metabolic syndrome 07/27/2022   OSA on CPAP 06/24/2019   Low back pain 04/30/2019   Dizziness 07/04/2017   Chronic seasonal allergic rhinitis 01/25/2017   Chronic sinusitis 01/25/2017   Lateral epicondylitis 01/13/2016   Amenorrhea 06/28/2015   Situational syncope 05/07/2015   GERD (gastroesophageal reflux disease) 04/21/2015   Routine general medical examination at a health care facility 04/21/2015   Iron deficiency anemia 12/02/2013   Migraine headache without aura 03/11/2013   Family history of early CAD 09/08/2012    PCP: Olive Bass, FNP  REFERRING PROVIDER: Rodolph Bong, MD  REFERRING DIAG: Trochanteric bursitis, right hip  THERAPY DIAG:  Pain in right hip  Muscle weakness (generalized)  Rationale for Evaluation and Treatment: Rehabilitation  ONSET DATE: Approximately 1 year   SUBJECTIVE:  SUBJECTIVE STATEMENT: Patient reports she thinks the patch did work for her. She did walk for an hour today and did still have some of the tightness.  PAIN:  Are you having pain? Yes:  NPRS scale: 5/10 (9/10 when walking/standing >10 minutes) Pain location: Right hip Pain description: Tight Aggravating factors: Walking Relieving factors: Sitting, standing for  extended periods  PERTINENT HISTORY: Chronic low back pain, see other PMH above  PRECAUTIONS: None  PATIENT GOALS: Pain relief so she can exercise and lose weight   OBJECTIVE:  PATIENT SURVEYS:  FOTO 49% functional status  MUSCLE LENGTH: Slight limitation bilateral hamstring flexibility  POSTURE:   Increased lumbar lordosis, rounded shoulders  PALPATION: Tender to palpation superior and posterior greater tuberosity  LOWER EXTREMITY MMT:  MMT Right eval Left eval Right 07/24/2023 Right 07/30/2023  Hip flexion 4 4    Hip extension 4- 4-  4-  Hip abduction  3+ 4- 3+ 3+  Hip adduction      Hip internal rotation      Hip external rotation      Knee flexion 5 5    Knee extension 5 5    Ankle dorsiflexion      Ankle plantarflexion      Ankle inversion      Ankle eversion       (Blank rows = not tested)  FUNCTIONAL TESTS:  Squat: patient will mild knee valgus right > left with weight shift toward left  GAIT: Assistive device utilized: None Level of assistance: Complete Independence Comments: Trendelenburg   TODAY'S TREATMENT:         [facility] Adult PT Treatment:                                                DATE: 07/30/2023 Therapeutic Exercise: NuStep L7 x 5 min with LE while taking subjective Side clamshell with green 3 x 15 with right Modified side plank lifts 2 x 10 on right Bridge 2 x 10 Squat to table holding 10# at chest 2 x 10 Lateral 6" step-up and over 3 x 5 Modalities: Iontophoresis: extended release patch x 4 hours applied to right lateral hip, 84mA-min, 1 mL dexamethasone   [facility] Adult PT Treatment:                                                DATE: 07/25/2023 Therapeutic Exercise: NuStep L6 x 5 min with LE while taking subjective Side clamshell with red x 15, green 2 x 15 Bridge with green at knees 2 x 10 Standing hip hike 3 x 10 on right  SLS with focus on level pelvis 3 x 30 sec Squat to table 2 x 10 Modalities: Iontophoresis: extended release patch x 4 hours, 64mA-min, 1 mL dexamethasone  [facility] Adult PT Treatment:                                                DATE: 07/17/2023 Therapeutic Exercise: Bridge with red at knees 10 x 5 sec Clamshell x 15 Squat to table x 10  PATIENT EDUCATION:  Education details: HEP, iontophoresis instruction Person educated: Patient Education method: Explanation, Demonstration, Tactile cues, Verbal cues Education comprehension: verbalized understanding, returned demonstration, verbal cues required, tactile cues required, and needs further education  HOME EXERCISE  PROGRAM: Access Code: [email or web address]    ASSESSMENT: CLINICAL IMPRESSION: Patient tolerated therapy well with no adverse effects. Therapy focused primarily on strengthening for the right  hip abductor and pelvic control. She does continue to exhibit gross right hip strength deficits and reports tightening of right hip musculature with exercises. She was able to complete all prescribed exercises and was able to tolerate progressed of closed chain strengthening. No changes made to HEP this visit. She did report benefit from iontophoresis following last visit so continued with ionto this visit. Patient would benefit from continued skilled PT to progress her strength in order to reduce pain and maximize functional ability.    OBJECTIVE IMPAIRMENTS: Abnormal gait, decreased activity tolerance, decreased strength, impaired flexibility, improper body mechanics, postural dysfunction, and pain.   ACTIVITY LIMITATIONS: lifting, standing, squatting, and locomotion level  PARTICIPATION LIMITATIONS: meal prep, cleaning, shopping, and community activity  PERSONAL FACTORS: Fitness, Past/current experiences, and Time since onset of injury/illness/exacerbation are also affecting patient's functional outcome.    GOALS: Goals reviewed with patient? Yes  SHORT TERM GOALS: Target date: 08/14/2023  Patient will be I with initial HEP in order to progress with therapy. Baseline: HEP provided at eval Goal status: INITIAL  2.  Patient will report right hip pain with walking/standing > 10 minutes </= 4/10 in order to reduce functional limitations Baseline: 9/10 Goal status: INITIAL  3.  Patient will demonstrate proper squat technique to progress hip strengthening and improve LE control Baseline: squat deviations noted above Goal status: INITIAL  LONG TERM GOALS: Target date: 09/11/2023  Patient will be I with final HEP to maintain progress from PT. Baseline: HEP provided at eval Goal status: INITIAL  2.  Patient  will report >/= 59% status on FOTO to indicate improved functional ability. Baseline: 49% functional status Goal status: INITIAL  3.  Patient will demonstrate right hip strength >/= 4/5 MMT in order to improve walking and standing tolerance Baseline: see limitations noted above Goal status: INITIAL  4.  Patient will be able to walk/stand >/= 30 minutes without an increase in pain in order to improve ability to exercise and cook Baseline: patient reports pain with standing or walking 10 minutes Goal status: INITIAL   PLAN: PT FREQUENCY: 1x/week  PT DURATION: 8 weeks  PLANNED INTERVENTIONS: Therapeutic exercises, Therapeutic activity, Neuromuscular re-education, Balance training, Gait training, Patient/Family education, Self Care, Joint mobilization, Aquatic Therapy, Dry Needling, Electrical stimulation, Cryotherapy, Moist heat, Ionotophoresis 4mg /ml Dexamethasone, Manual therapy, and Re-evaluation  PLAN FOR NEXT SESSION: Review HEP and progress PRN, hip strengthening, progress closed chain strengthening and LE control   Rosana Hoes, PT, DPT, LAT, ATC 07/30/23  3:27 PM Phone: [phone] Fax: [phone]

## 2023-07-30 ENCOUNTER — Other Ambulatory Visit: Payer: Self-pay

## 2023-07-30 ENCOUNTER — Encounter: Payer: Self-pay | Admitting: Physical Therapy

## 2023-07-30 ENCOUNTER — Ambulatory Visit: Payer: Commercial Managed Care - HMO | Admitting: Physical Therapy

## 2023-07-30 DIAGNOSIS — M25551 Pain in right hip: Secondary | ICD-10-CM | POA: Diagnosis not present

## 2023-07-30 DIAGNOSIS — M6281 Muscle weakness (generalized): Secondary | ICD-10-CM | POA: Diagnosis not present

## 2023-07-30 DIAGNOSIS — M7061 Trochanteric bursitis, right hip: Secondary | ICD-10-CM | POA: Diagnosis not present

## 2023-07-31 ENCOUNTER — Ambulatory Visit: Payer: Commercial Managed Care - HMO | Admitting: Pulmonary Disease

## 2023-08-01 ENCOUNTER — Encounter: Payer: Self-pay | Admitting: Family

## 2023-08-06 ENCOUNTER — Other Ambulatory Visit: Payer: Self-pay

## 2023-08-06 ENCOUNTER — Ambulatory Visit: Payer: Commercial Managed Care - HMO | Attending: Family Medicine | Admitting: Physical Therapy

## 2023-08-06 ENCOUNTER — Encounter: Payer: Self-pay | Admitting: Physical Therapy

## 2023-08-06 DIAGNOSIS — K5909 Other constipation: Secondary | ICD-10-CM | POA: Diagnosis not present

## 2023-08-06 DIAGNOSIS — K9289 Other specified diseases of the digestive system: Secondary | ICD-10-CM | POA: Diagnosis not present

## 2023-08-06 DIAGNOSIS — M25551 Pain in right hip: Secondary | ICD-10-CM | POA: Diagnosis not present

## 2023-08-06 DIAGNOSIS — M6281 Muscle weakness (generalized): Secondary | ICD-10-CM | POA: Diagnosis not present

## 2023-08-06 DIAGNOSIS — K219 Gastro-esophageal reflux disease without esophagitis: Secondary | ICD-10-CM | POA: Diagnosis not present

## 2023-08-06 NOTE — Therapy (Addendum)
OUTPATIENT PHYSICAL THERAPY TREATMENT  DISCHARGE   Patient Name: Amanda Mcintosh MRN: [id number] DOB:Aug 01, 1971, 52 y.o., female Today's Date: 08/06/2023   END OF SESSION:  PT End of Session - 08/06/23 1344     Visit Number 4    Number of Visits 9    Date for PT Re-Evaluation 09/11/23    Authorization Type [employer]    Authorization - Number of Visits 27    PT Start Time 1322    PT Stop Time 1400    PT Time Calculation (min) 38 min    Activity Tolerance Patient tolerated treatment well    Behavior During Therapy WFL for tasks assessed/performed                Past Medical History:  Diagnosis Date   Fibroids    Heartburn    History of headache    Hyperlipidemia    Hypertension    Obesity    Thyroid condition    Vitamin D deficiency    Past Surgical History:  Procedure Laterality Date   CHOLECYSTECTOMY  12/31/2004   Pulmonary function tests  09/14/2022   Normal lung volumes and DLCO.  Normal spirometry.   ROBOT ASSISTED MYOMECTOMY  12/31/2009   Stress Echocardiogram  08/2012   Exercise to max HR 164 bpm - 93% MPHR 177 bpm.  Normal BP and HR response.  No electrical or echocardiographic evidence of exercise-induced ischemia.  Normal EF, baseline > 55% with no RWMA.  Peak stress LVEF increased to 65-70% with no RWMA.  LOW RISK.   TONSILLECTOMY     TRANSTHORACIC ECHOCARDIOGRAM  08/15/2022   NORMAL.  EF 55 to 60%.  No RWMA.  Normal diastolic parameters.  Unable to assess PAP and otherwise normal RV.  Normal AOV and MV.  Mildly elevated RAP.   WISDOM TOOTH EXTRACTION     Patient Active Problem List   Diagnosis Date Noted   Dermatofibroma of left lower leg 06/27/2023   Pre-diabetes 01/16/2023   Other hyperlipidemia 10/17/2022   Vitamin D deficiency 08/28/2022   Prediabetes 08/28/2022   Insulin resistance 08/28/2022   Depression 08/28/2022   Essential hypertension 07/27/2022   Precordial pain 07/27/2022   Family history of CHF (congestive heart  failure) 07/27/2022   Palpitations 07/27/2022   DOE (dyspnea on exertion) 07/27/2022   Hyperlipidemia due to dietary fat intake 07/27/2022   Metabolic syndrome 07/27/2022   OSA on CPAP 06/24/2019   Low back pain 04/30/2019   Dizziness 07/04/2017   Chronic seasonal allergic rhinitis 01/25/2017   Chronic sinusitis 01/25/2017   Lateral epicondylitis 01/13/2016   Amenorrhea 06/28/2015   Situational syncope 05/07/2015   GERD (gastroesophageal reflux disease) 04/21/2015   Routine general medical examination at a health care facility 04/21/2015   Iron deficiency anemia 12/02/2013   Migraine headache without aura 03/11/2013   Family history of early CAD 09/08/2012    PCP: Olive Bass, FNP  REFERRING PROVIDER: Rodolph Bong, MD  REFERRING DIAG: Trochanteric bursitis, right hip  THERAPY DIAG:  Pain in right hip  Muscle weakness (generalized)  Rationale for Evaluation and Treatment: Rehabilitation  ONSET DATE: Approximately 1 year   SUBJECTIVE:  SUBJECTIVE STATEMENT: Patient reports the hip pain comes and goes now. She has been noticing some lower back pain recently. She also notes that her stomach is feeling a little update and she just came from the GI doctor.  PAIN:  Are you having pain? Yes:  NPRS scale: 5/10 (9/10 when walking/standing >10 minutes)  Pain location: Right hip Pain description: Tight Aggravating factors: Walking Relieving factors: Sitting, standing for extended periods  PERTINENT HISTORY: Chronic low back pain, see other PMH above  PRECAUTIONS: None  PATIENT GOALS: Pain relief so she can exercise and lose weight   OBJECTIVE:  PATIENT SURVEYS:  FOTO 49% functional status  MUSCLE LENGTH: Slight limitation bilateral hamstring flexibility  POSTURE:   Increased lumbar lordosis, rounded shoulders  PALPATION: Tender to palpation superior and posterior greater tuberosity  LOWER EXTREMITY MMT:  MMT Right eval Left eval Right 07/24/2023  Right 07/30/2023 Right 08/06/2023  Hip flexion 4 4     Hip extension 4- 4-  4-   Hip abduction 3+ 4- 3+ 3+ 4-  Hip adduction       Hip internal rotation       Hip external rotation       Knee flexion 5 5     Knee extension 5 5     Ankle dorsiflexion       Ankle plantarflexion       Ankle inversion       Ankle eversion        (Blank rows = not tested)  FUNCTIONAL TESTS:  Squat: patient will mild knee valgus right > left with weight shift toward left  GAIT: Assistive device utilized: None Level of assistance: Complete Independence Comments: Trendelenburg   TODAY'S TREATMENT:         [facility] Adult PT Treatment:                                                DATE: 08/06/2023 Therapeutic Exercise: Recumbent bike L1 x 5 min while taking subjective Piriformis stretch using towel 3 x 30 sec on right LTR x 10 Bridge 3 x 10 Sidelying hip abduction 3 x 15 on right Manual: LAD right LE Modalities: Iontophoresis: extended release patch x 4 hours applied to right lateral hip, 33mA-min, 1 mL dexamethasone   [facility] Adult PT Treatment:                                                DATE: 07/30/2023 Therapeutic Exercise: NuStep L7 x 5 min with LE while taking subjective Side clamshell with green 3 x 15 with right Modified side plank lifts 2 x 10 on right Bridge 2 x 10 Squat to table holding 10# at chest 2 x 10 Lateral 6" step-up and over 3 x 5 Modalities: Iontophoresis: extended release patch x 4 hours applied to right lateral hip, 7mA-min, 1 mL dexamethasone  [facility] Adult PT Treatment:                                                DATE: 07/25/2023 Therapeutic Exercise: NuStep L6 x 5 min with LE while taking subjective Side clamshell with red x 15, green 2 x 15 Bridge with green at knees 2 x 10 Standing hip hike 3 x 10 on right  SLS with focus on level pelvis 3 x 30 sec Squat to table 2 x 10 Modalities: Iontophoresis: extended release patch x 4 hours, 83mA-min, 1 mL  dexamethasone  [facility] Adult PT Treatment:                                                DATE: 07/17/2023 Therapeutic Exercise: Bridge with red at knees 10 x 5 sec Clamshell x 15 Squat to table x 10  PATIENT EDUCATION:  Education details: HEP, iontophoresis instruction Person educated: Patient Education method: Explanation, Demonstration, Tactile cues, Verbal cues Education comprehension: verbalized understanding, returned demonstration, verbal cues required, tactile cues required, and needs further education  HOME EXERCISE PROGRAM: Access Code: [email or web address]    ASSESSMENT: CLINICAL IMPRESSION: Patient tolerated therapy well with no adverse effects. Therapy focused on right hip mobility and stretching, and continued progressing of hip strengthening. She reported stomach uneasiness at beginning of therapy so exercises remained mat based to avoid exacerbating GI issues. She did exhibit an improvement in right hip abduction strength this visit. She did request the iontophoresis this visit as it has been beneficial for her, so continued with the iontophoresis extended release patch again this visit. No changes made to HEP this visit. Patient would benefit from continued skilled PT to progress her strength in order to reduce pain and maximize functional ability.    OBJECTIVE IMPAIRMENTS: Abnormal gait, decreased activity tolerance, decreased strength, impaired flexibility, improper body mechanics, postural dysfunction, and pain.   ACTIVITY LIMITATIONS: lifting, standing, squatting, and locomotion level  PARTICIPATION LIMITATIONS: meal prep, cleaning, shopping, and community activity  PERSONAL FACTORS: Fitness, Past/current experiences, and Time since onset of injury/illness/exacerbation are also affecting patient's functional outcome.    GOALS: Goals reviewed with patient? Yes  SHORT TERM GOALS: Target date: 08/14/2023  Patient will be I with initial HEP in order to progress with  therapy. Baseline: HEP provided at eval Goal status: INITIAL  2.  Patient will report right hip pain with walking/standing > 10 minutes </= 4/10 in order to reduce functional limitations Baseline: 9/10 Goal status: INITIAL  3.  Patient will demonstrate proper squat technique to progress hip strengthening and improve LE control Baseline: squat deviations noted above Goal status: INITIAL  LONG TERM GOALS: Target date: 09/11/2023  Patient will be I with final HEP to maintain progress from PT. Baseline: HEP provided at eval Goal status: INITIAL  2.  Patient will report >/= 59% status on FOTO to indicate improved functional ability. Baseline: 49% functional status Goal status: INITIAL  3.  Patient will demonstrate right hip strength >/= 4/5 MMT in order to improve walking and standing tolerance Baseline: see limitations noted above Goal status: INITIAL  4.  Patient will be able to walk/stand >/= 30 minutes without an increase in pain in order to improve ability to exercise and cook Baseline: patient reports pain with standing or walking 10 minutes Goal status: INITIAL   PLAN: PT FREQUENCY: 1x/week  PT DURATION: 8 weeks  PLANNED INTERVENTIONS: Therapeutic exercises, Therapeutic activity, Neuromuscular re-education, Balance training, Gait training, Patient/Family education, Self Care, Joint mobilization, Aquatic Therapy, Dry Needling, Electrical stimulation, Cryotherapy, Moist heat, Ionotophoresis 4mg /ml Dexamethasone, Manual therapy, and Re-evaluation  PLAN FOR NEXT SESSION: Review HEP and progress PRN, hip strengthening, progress closed chain strengthening and LE control   Rosana Hoes, PT, DPT, LAT, ATC 08/06/23  2:07 PM Phone: [phone] Fax: [phone]   PHYSICAL THERAPY DISCHARGE SUMMARY  Visits from Start of Care: 4  Current functional level related to goals / functional outcomes: See  above   Remaining deficits: See above   Education / Equipment: HEP    Patient agrees to discharge. Patient goals were partially met. Patient is being discharged due to not returning since the last visit.  Rosana Hoes, PT, DPT, LAT, ATC 10/21/23  1:09 PM Phone: [phone] Fax: [phone]

## 2023-08-13 ENCOUNTER — Ambulatory Visit: Payer: Commercial Managed Care - HMO | Admitting: Physical Therapy

## 2023-09-12 NOTE — Progress Notes (Signed)
PATIENT: Amanda Mcintosh DOB: May 15, 1971  REASON FOR VISIT: follow up HISTORY FROM: patient  Chief Complaint  Patient presents with   Room 11    Pt is here Alone. Pt states that things have been going okay with her CPAP Machine. Pt wants to know how long she is going to have to be on her CPAP Machine.      HISTORY OF PRESENT ILLNESS:  09/16/23 ALL:  She returns for follow up for OSA on CPAP. She continues to do well. She is using CPAP most every day for about 7-8 hours. She denies concerns with machine or supplies. She recently started a new job and back to work full time. She is walking a mile, every day.     09/13/2022 ALL: She returns for follow up for OSA on CPAP. She continues to do well. She has adjusted to therapy and using every night. She denies concerns with machine or supplies. She was laid off from her job of 15 years. She is planning to look for a new role next year. Husband has prostate cancer.     03/28/2021 ALL: She returns for follow up for OSA on CPAP. She has continued fairly consistent use. She does not take it with her if she goes out of town. She spent 2 weeks with her daughter and did not take it with her. She feels that she sleeps better without CPAP but recognizes health benefits of using it. She has less headaches and does not wake feeling congested. She has recently started smoking. She has moved into a rental home that she doesn't love and her mother passed away. She is under more stress. She has a plan in place to stop smoking and increase exercise. She would like to lose weight.     09/28/2020 ALL:  Amanda Mcintosh is a 52 y.o. female here today for follow up for OSA on CPAP. She is doing well. She admits there are days when she doesn't use CPAP. She went out of town this past weekend and did not use her machine. She does note benefit of using CPAP.  She wakes feeling well rested.   Compliance report dated 06/25/2020 through 09/22/2020 reveals  that she used CPAP 69 of the past 90 days for compliance of 77%.  She used CPAP greater than 4 hours 64 of the past 90 days for compliance of 71%.  Average usage was 6 hours and 55 minutes.  Residual AHI was 0.6 on 7 to 14 cm of water and an EPR of three.  There was no significant leak noted.  HISTORY: (copied from my note on 06/27/2020)  Amanda Mcintosh is a 52 y.o. female here today for follow up for OSA on CPAP. She admits that she has not been compliant with CPAP usage recently. She has gone on several vacations and not taken her machine. She knows that she needs to use it. No other concerns. She has recently quit smoking. She is working on [employer].    Compliance report dated 03/17/2020 through 06/14/2020 reveals that she used CPAP 26 of the last 90 days for compliance of 29%.  She is CPAP greater than 4 hours 23 of the past 90 days for compliance of 26%.  Average usage on days used was 6 hours and 2 minutes.  Residual AHI was 0.7 on 7 to 14 cm of water and an EPR of 3.  There was no significant leak noted.   HISTORY: (copied from  my note on 06/24/2019)   Amanda Mcintosh is a 52 y.o. female here today for follow up of OSA on CPAP.  She reports doing very well with CPAP therapy.  She has noted that she is grinding her teeth less.  Compliance download dated 05/25/2019 through 06/23/2019 reveals that she is using her machine 28 out of the last 30 days for compliance of 93%.  28 days were used for greater than 4 hours for compliance of 93%.  Average usage was 6 hours and 1 minute.  AHI was 0.5 on 7 to 14 cm of water and an EPR level of 3.  There was no significant leak.  She returns today for evaluation.     History (copied from Dr Teofilo Pod note on 03/03/2019)   Dear Amanda Mcintosh,    I saw your patient, Amanda Mcintosh, upon your kind request in my sleep clinic today for initial consultation of her sleep disorder, in particular, concern for underlying obstructive sleep apnea. The patient is  unaccompanied today. As you know, Amanda Mcintosh is a 52 year old right-handed woman with an underlying medical history of reflux disease, vitamin D deficiency, smoking, headaches, dizziness, seasonal allergies, chronic sinusitis, and morbid obesity with a BMI of over 40, who reports snoring and excessive daytime somnolence as well as witnessed apneas per family's report. I reviewed your office note from 01/28/2019. She had a home sleep test about 4-1/2 years ago and I reviewed the results. Test date was 09/14/2014, overall AHI was 9.2 per hour, O2 nadir was 81%. She weighed 231 pounds at the time, BMI of 38. Her Epworth sleepiness score is 7 out of 24 today, fatigue score is 18 out of 63. She's not sleeping well. She reports a bedtime of 9 PM, some difficulty falling asleep. She does turn her bedroom TV off at 9 PM. She has a rise time of 6 AM. No night to night nocturia, occasional AM HAs, does have nasal congestion. Mom had OSA.  She had T/A as a child, no asthma. See Dr. Lazarus Salines for sinus d/s.  She lives with her husband, she has 1 child. She works for [employer]. She smokes one pack per day, does not utilize alcohol typically and does not drink caffeine on a regular basis.  She is fasting from her church, has an appointment for FU in wt management soon. She has seen an endocrinologist. She has seen an integrative health provider. She has had stress, lost her mom 2 years ago. Recently lost a friend.   REVIEW OF SYSTEMS: Out of a complete 14 system review of symptoms, the patient complains only of the following symptoms, none and all other reviewed systems are negative.  ESS: 3/24, previously 2 FSS:30  ALLERGIES: Allergies  Allergen Reactions   Gadolinium Derivatives Other (See Comments)    PT STARTED SHAKING AND FELT VERY COLD INTERNALLY BUT FACE WAS VERY FLUSHED. THIS WAS A DELAYED REACTION (ABOUT 1 HOUR AFTER INJECTION). SHE TOOK BENADRYL AND HYDRATED AND SYMPTOMS SUBSIDED    HOME  MEDICATIONS: Outpatient Medications Prior to Visit  Medication Sig Dispense Refill   albuterol (VENTOLIN HFA) 108 (90 Base) MCG/ACT inhaler Inhale 1-2 puffs into the lungs every 6 (six) hours as needed for wheezing or shortness of breath. (Patient taking differently: Inhale 1-2 puffs into the lungs as needed for wheezing or shortness of breath.) 1 each 0   AMBULATORY NON FORMULARY MEDICATION Compression knee sleeve for pre-patellar bursitis Dispense 1 pre-patellar bursitis Use as needed 1 Units 0  budesonide-formoterol (SYMBICORT) 160-4.5 MCG/ACT inhaler Inhale 2 puffs into the lungs 2 (two) times daily. 1 each 3   diclofenac Sodium (VOLTAREN) 1 % GEL Apply 4 g topically 4 (four) times daily. To affected joint. 100 g 11   fluticasone (FLONASE) 50 MCG/ACT nasal spray Place 1 spray into both nostrils in the morning and at bedtime. (Patient taking differently: Place 1 spray into both nostrils as needed.) 9.9 mL 2   hydrOXYzine (VISTARIL) 25 MG capsule Take 1 capsule (25 mg total) by mouth every 8 (eight) hours as needed for anxiety. 30 capsule 0   pantoprazole (PROTONIX) 40 MG tablet Take 40 mg by mouth daily.     tiZANidine (ZANAFLEX) 4 MG tablet TAKE 1 TABLET (4 MG TOTAL) BY MOUTH EVERY 8 (EIGHT) HOURS AS NEEDED FOR MUSCLE SPASMS 90 tablet 1   valsartan-hydrochlorothiazide (DIOVAN-HCT) 160-25 MG tablet Take 1 tablet by mouth daily. 90 tablet 3   Facility-Administered Medications Prior to Visit  Medication Dose Route Frequency Provider Last Rate Last Admin   diclofenac Sodium (VOLTAREN) 1 % topical gel 4 g  4 g Topical QID Rodolph Bong, MD        PAST MEDICAL HISTORY: Past Medical History:  Diagnosis Date   Fibroids    Heartburn    History of headache    Hyperlipidemia    Hypertension    Obesity    Thyroid condition    Vitamin D deficiency     PAST SURGICAL HISTORY: Past Surgical History:  Procedure Laterality Date   CHOLECYSTECTOMY  12/31/2004   Pulmonary function tests   09/14/2022   Normal lung volumes and DLCO.  Normal spirometry.   ROBOT ASSISTED MYOMECTOMY  12/31/2009   Stress Echocardiogram  08/2012   Exercise to max HR 164 bpm - 93% MPHR 177 bpm.  Normal BP and HR response.  No electrical or echocardiographic evidence of exercise-induced ischemia.  Normal EF, baseline > 55% with no RWMA.  Peak stress LVEF increased to 65-70% with no RWMA.  LOW RISK.   TONSILLECTOMY     TRANSTHORACIC ECHOCARDIOGRAM  08/15/2022   NORMAL.  EF 55 to 60%.  No RWMA.  Normal diastolic parameters.  Unable to assess PAP and otherwise normal RV.  Normal AOV and MV.  Mildly elevated RAP.   WISDOM TOOTH EXTRACTION      FAMILY HISTORY: Family History  Problem Relation Age of Onset   Diabetes Mother    Hypertension Mother    Heart failure Mother    Stroke Mother    High Cholesterol Mother    Depression Mother    Anxiety disorder Mother    Sleep apnea Mother    Obesity Mother    Obstructive Sleep Apnea Mother    Coronary artery disease Mother 19       s/p PCI   Diabetes Mellitus II Mother    Heart attack Mother 47   COPD Father        smoker   High blood pressure Father    High Cholesterol Father    Breast cancer Sister    Skin cancer Sister    HIV/AIDS Brother    Heart disease Brother 23       "enlarged heart" = presented with Acute Resp Failure   Hyperlipidemia Other    Migraines Neg Hx     SOCIAL HISTORY: Social History   Socioeconomic History   Marital status: Legally Separated    Spouse name: Darryl   Number of children: 1   Years  of education: Not on file   Highest education level: Some college, no degree  Occupational History   Occupation: Customer service manager: [employer]  Tobacco Use   Smoking status: Former    Current packs/day: 0.00    Types: Cigarettes    Quit date: 02/27/2021    Years since quitting: 2.5    Passive exposure: Past   Smokeless tobacco: Never   Tobacco comments:    Quit in Feb 27 2022. Tay 07/26/22   Substance and Sexual Activity   Alcohol use: No   Drug use: No   Sexual activity: Not on file  Other Topics Concern   Not on file  Social History Narrative   Not on file   Social Determinants of Health   Financial Resource Strain: High Risk (07/08/2023)   Overall Financial Resource Strain (CARDIA)    Difficulty of Paying Living Expenses: Very hard  Food Insecurity: Food Insecurity Present (07/08/2023)   Hunger Vital Sign    Worried About Running Out of Food in the Last Year: Sometimes true    Ran Out of Food in the Last Year: Never true  Transportation Needs: No Transportation Needs (07/08/2023)   PRAPARE - Administrator, Civil Service (Medical): No    Lack of Transportation (Non-Medical): No  Physical Activity: Unknown (07/08/2023)   Exercise Vital Sign    Days of Exercise per Week: 0 days    Minutes of Exercise per Session: Not on file  Stress: Stress Concern Present (07/08/2023)   [employer] of Occupational Health - Occupational Stress Questionnaire    Feeling of Stress : Very much  Social Connections: Unknown (07/08/2023)   Social Connection and Isolation Panel [NHANES]    Frequency of Communication with Friends and Family: Twice a week    Frequency of Social Gatherings with Friends and Family: Never    Attends Religious Services: More than 4 times per year    Active Member of [employer] or Organizations: No    Attends Engineer, structural: Not on file    Marital Status: Not on file  Recent Concern: Social Connections - Somewhat Isolated (04/30/2023)   Received from [employer], [facility]   Social Network    How would you rate your social network (family, work, friends)?: Restricted participation with some degree of social isolation  Intimate Partner Violence: Not At Risk (04/30/2023)   Received from [employer], [facility]   HITS    Over the last 12 months how often did your partner physically hurt you?: 1    Over the last 12 months how often  did your partner insult you or talk down to you?: 3    Over the last 12 months how often did your partner threaten you with physical harm?: 1    Over the last 12 months how often did your partner scream or curse at you?: 3      PHYSICAL EXAM  Vitals:   09/16/23 0935  BP: 108/62  Pulse: 63  Weight: 269 lb 8 oz (122.2 kg)  Height: 5\' 5"  (1.651 m)     Body mass index is 44.85 kg/m.  Generalized: Well developed, in no acute distress  Neurological examination  Mentation: Alert oriented to time, place, history taking. Follows all commands speech and language fluent Cranial nerve II-XII: Pupils were equal round reactive to light. Extraocular movements were full, visual field were full  Motor: The motor testing reveals 5 over 5 strength of all  4 extremities. Good symmetric motor tone is noted throughout.  Gait and station: Gait is normal.    DIAGNOSTIC DATA (LABS, IMAGING, TESTING) - I reviewed patient records, labs, notes, testing and imaging myself where available.      No data to display           Lab Results  Component Value Date   WBC 10.4 04/12/2023   HGB 13.2 04/12/2023   HCT 39.8 04/12/2023   MCV 87.8 04/12/2023   PLT 374.0 04/12/2023      Component Value Date/Time   NA 140 06/28/2023 1236   NA 141 10/10/2022 1131   K 3.8 06/28/2023 1236   CL 104 06/28/2023 1236   CO2 29 06/28/2023 1236   GLUCOSE 104 (H) 06/28/2023 1236   BUN 14 06/28/2023 1236   BUN 14 10/10/2022 1131   CREATININE 0.83 06/28/2023 1236   CALCIUM 9.4 06/28/2023 1236   PROT 6.8 04/12/2023 1351   PROT 7.1 10/10/2022 1131   ALBUMIN 4.1 04/12/2023 1351   ALBUMIN 4.2 10/10/2022 1131   AST 12 04/12/2023 1351   ALT 18 04/12/2023 1351   ALKPHOS 69 04/12/2023 1351   BILITOT 0.6 04/12/2023 1351   BILITOT 0.4 10/10/2022 1131   GFRNONAA >60 04/03/2022 1240   GFRAA >60 02/03/2019 0013   Lab Results  Component Value Date   CHOL 252 (H) 10/10/2022   HDL 38 (L) 10/10/2022   LDLCALC 187 (H)  10/10/2022   LDLDIRECT 139.0 09/06/2016   TRIG 148 10/10/2022   CHOLHDL 6.6 (H) 10/10/2022   Lab Results  Component Value Date   HGBA1C 6.4 06/28/2023   Lab Results  Component Value Date   VITAMINB12 549 04/12/2023   Lab Results  Component Value Date   TSH 0.96 04/12/2023       ASSESSMENT AND PLAN 52 y.o. year old female  has a past medical history of Fibroids, Heartburn, History of headache, Hyperlipidemia, Hypertension, Obesity, Thyroid condition, and Vitamin D deficiency. here with     ICD-10-CM   1. OSA on CPAP  G47.33 For home use only DME continuous positive airway pressure (CPAP)      Katelen is doing well. Compliance report shows excellent compliance. She does recognize health benefits of using CPAP. She was encouraged to continue using nightly for at last 4 hours. She will continue working on healthy lifestyle habits. She will continue follow up with PCP. Follow up with me in 1 year.    Orders Placed This Encounter  Procedures   For home use only DME continuous positive airway pressure (CPAP)    Supplies    Order Specific Question:   Length of Need    Answer:   Lifetime    Order Specific Question:   Patient has OSA or probable OSA    Answer:   Yes    Order Specific Question:   Is the patient currently using CPAP in the home    Answer:   Yes    Order Specific Question:   Settings    Answer:   Other see comments    Order Specific Question:   CPAP supplies needed    Answer:   Mask, headgear, cushions, filters, heated tubing and water chamber     No orders of the defined types were placed in this encounter.      Shawnie Dapper, FNP-C 09/16/2023, 10:07 AM [facility] [address], [address] [place], [place] [address] [phone]

## 2023-09-12 NOTE — Patient Instructions (Signed)

## 2023-09-16 ENCOUNTER — Encounter: Payer: Self-pay | Admitting: Family Medicine

## 2023-09-16 ENCOUNTER — Ambulatory Visit (INDEPENDENT_AMBULATORY_CARE_PROVIDER_SITE_OTHER): Payer: Commercial Managed Care - PPO | Admitting: Family Medicine

## 2023-09-16 VITALS — BP 108/62 | HR 63 | Ht 65.0 in | Wt 269.5 lb

## 2023-09-16 DIAGNOSIS — G4733 Obstructive sleep apnea (adult) (pediatric): Secondary | ICD-10-CM

## 2023-09-19 ENCOUNTER — Ambulatory Visit: Payer: Commercial Managed Care - HMO | Admitting: Pulmonary Disease

## 2023-10-09 IMAGING — DX DG CHEST 2V
2 series · 2 of 2 positions shown · non-contrast
Comparison: 09/01/2021

CLINICAL DATA: Cough, shortness of breath

EXAM:
CHEST - 2 VIEW

[chest pa]
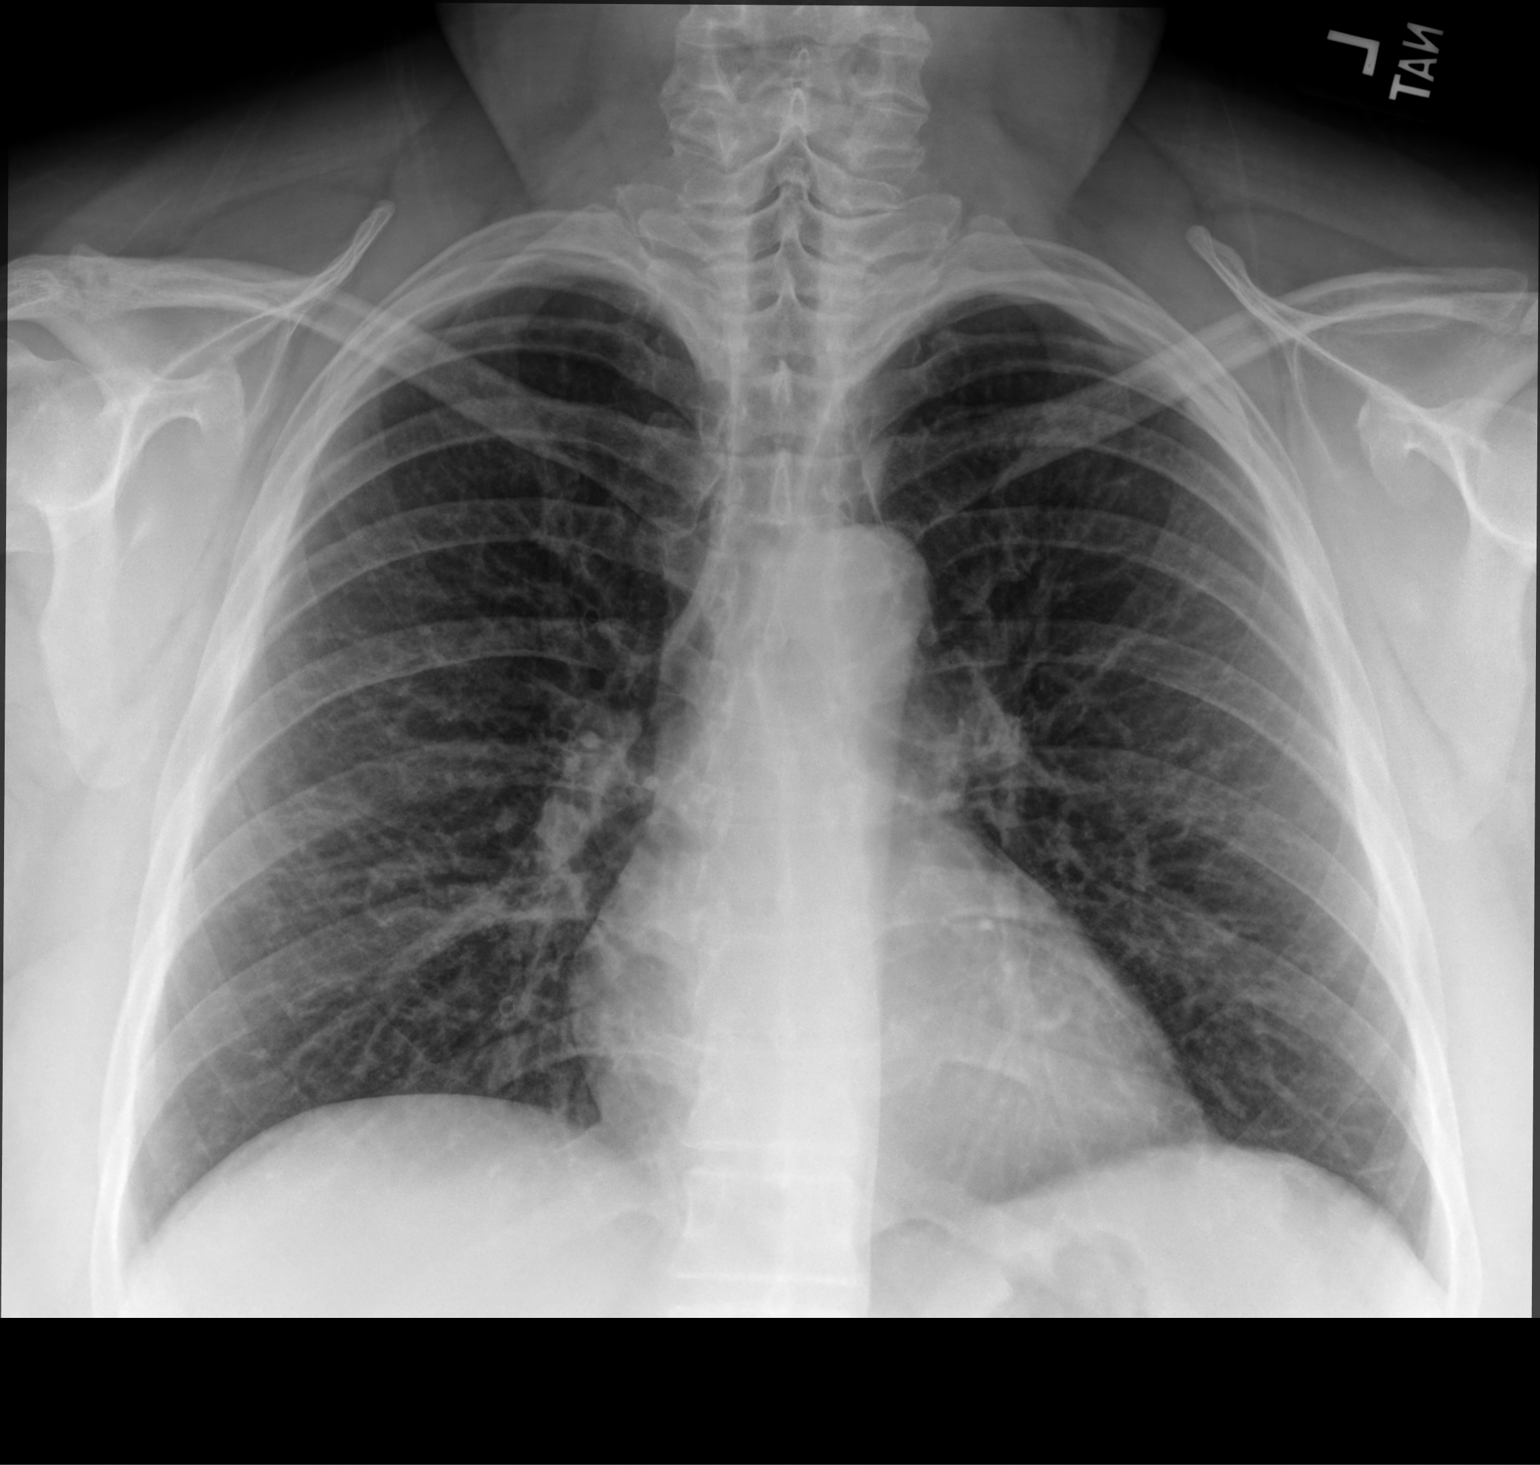

[chest lat]
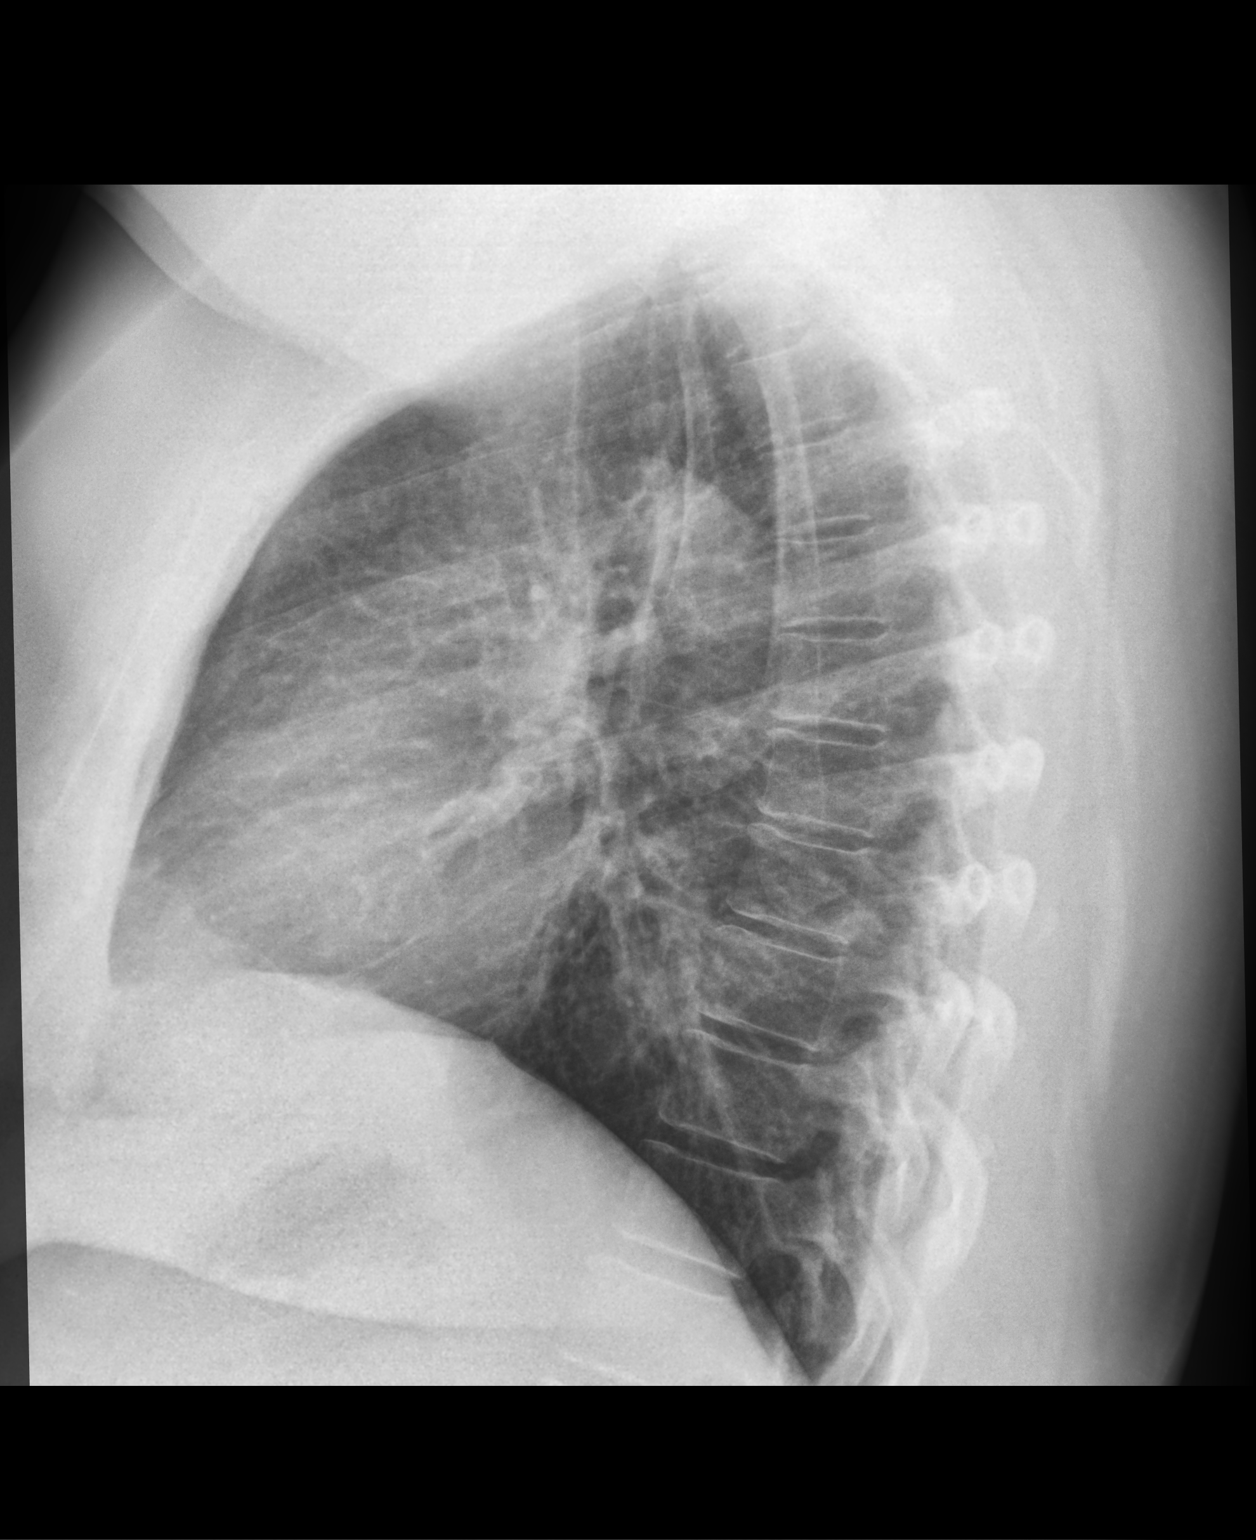

[2 of 2 positions shown; findings below may reference images not displayed]

FINDINGS: The heart size and mediastinal contours are within normal limits.
Both lungs are clear. The visualized skeletal structures are
unremarkable.
IMPRESSION: Negative.

## 2023-10-16 ENCOUNTER — Encounter: Payer: Self-pay | Admitting: Podiatry

## 2023-10-16 ENCOUNTER — Ambulatory Visit (INDEPENDENT_AMBULATORY_CARE_PROVIDER_SITE_OTHER): Payer: Commercial Managed Care - PPO | Admitting: Podiatry

## 2023-10-16 DIAGNOSIS — B351 Tinea unguium: Secondary | ICD-10-CM | POA: Diagnosis not present

## 2023-10-16 MED ORDER — CICLOPIROX 8 % EX SOLN: Freq: Every day | CUTANEOUS | 0 refills | Status: AC

## 2023-10-16 NOTE — Progress Notes (Signed)
Subjective:  Patient ID: Amanda Mcintosh, female    DOB: 1971/04/21,  MRN: [id number]  No chief complaint on file.   52 y.o. female presents with the above complaint.  Patient presents with thickened likely dystrophic mycotic toenails x 1 to the left hallux.  She states been present for quite some time it does not cause her any pain she wanted to get it evaluated she frequently make you feel little better.  Pain scale is 5 out of 10 dull aching nature.   Review of Systems: Negative except as noted in the HPI. Denies N/V/F/Ch.  Past Medical History:  Diagnosis Date   Fibroids    Heartburn    History of headache    Hyperlipidemia    Hypertension    Obesity    Thyroid condition    Vitamin D deficiency     Current Outpatient Medications:    AMBULATORY NON FORMULARY MEDICATION, Compression knee sleeve for pre-patellar bursitis Dispense 1 pre-patellar bursitis Use as needed, Disp: 1 Units, Rfl: 0   ciclopirox (PENLAC) 8 % solution, Apply topically at bedtime. Apply over nail and surrounding skin. Apply daily over previous coat. After seven (7) days, may remove with alcohol and continue cycle., Disp: 6.6 mL, Rfl: 0   diclofenac Sodium (VOLTAREN) 1 % GEL, Apply 4 g topically 4 (four) times daily. To affected joint., Disp: 100 g, Rfl: 11   fluticasone (FLONASE) 50 MCG/ACT nasal spray, Place 1 spray into both nostrils in the morning and at bedtime. (Patient taking differently: Place 1 spray into both nostrils as needed.), Disp: 9.9 mL, Rfl: 2   pantoprazole (PROTONIX) 40 MG tablet, Take 40 mg by mouth daily., Disp: , Rfl:    tiZANidine (ZANAFLEX) 4 MG tablet, TAKE 1 TABLET (4 MG TOTAL) BY MOUTH EVERY 8 (EIGHT) HOURS AS NEEDED FOR MUSCLE SPASMS, Disp: 90 tablet, Rfl: 1   valsartan-hydrochlorothiazide (DIOVAN-HCT) 160-25 MG tablet, Take 1 tablet by mouth daily., Disp: 90 tablet, Rfl: 3   albuterol (VENTOLIN HFA) 108 (90 Base) MCG/ACT inhaler, Inhale 2 puffs into the lungs every 6 (six) hours  as needed for wheezing or shortness of breath., Disp: 1 each, Rfl: 11   budesonide-formoterol (SYMBICORT) 160-4.5 MCG/ACT inhaler, Inhale 2 puffs into the lungs 2 (two) times daily., Disp: 1 each, Rfl: 3   hydrOXYzine (VISTARIL) 25 MG capsule, TAKE 1 CAPSULE (25 MG TOTAL) BY MOUTH EVERY 8 (EIGHT) HOURS AS NEEDED FOR ANXIETY., Disp: 30 capsule, Rfl: 0  Current Facility-Administered Medications:    diclofenac Sodium (VOLTAREN) 1 % topical gel 4 g, 4 g, Topical, QID, Rodolph Bong, MD  Social History   Tobacco Use  Smoking Status Former   Current packs/day: 0.00   Types: Cigarettes   Quit date: 02/27/2021   Years since quitting: 2.6   Passive exposure: Past  Smokeless Tobacco Never  Tobacco Comments   Quit in Feb 27 2022. Tay 07/26/22    Allergies  Allergen Reactions   Crestor [Rosuvastatin Calcium] Other (See Comments)    Severe myalgia   Gadolinium Derivatives Other (See Comments)    PT STARTED SHAKING AND FELT VERY COLD INTERNALLY BUT FACE WAS VERY FLUSHED. THIS WAS A DELAYED REACTION (ABOUT 1 HOUR AFTER INJECTION). SHE TOOK BENADRYL AND HYDRATED AND SYMPTOMS SUBSIDED   Objective:  There were no vitals filed for this visit. There is no height or weight on file to calculate BMI. Constitutional Well developed. Well nourished.  Vascular Dorsalis pedis pulses palpable bilaterally. Posterior tibial pulses palpable bilaterally. Capillary  refill normal to all digits.  No cyanosis or clubbing noted. Pedal hair growth normal.  Neurologic Normal speech. Oriented to person, place, and time. Epicritic sensation to light touch grossly present bilaterally.  Dermatologic Left hallux thickened elongated dystrophic mycotic toenails x 1 mild pain on palpation  Orthopedic: Normal joint ROM without pain or crepitus bilaterally. No visible deformities. No bony tenderness.   Radiographs: None Assessment:   1. Onychomycosis due to dermatophyte   2. Nail fungus    Plan:  Patient was  evaluated and treated and all questions answered.  Left hallux nail fungus -Educated the patient on the etiology of onychomycosis and various treatment options associated with improving the fungal load.  I explained to the patient that there is 3 treatment options available to treat the onychomycosis including topical, p.o., laser treatment.  Patient elected to undergo topical medication with Penlac.  I have asked her to apply twice a day.  She states understanding will do so immediately.   No follow-ups on file.

## 2023-10-21 ENCOUNTER — Other Ambulatory Visit: Payer: Self-pay | Admitting: Family

## 2023-10-21 ENCOUNTER — Encounter: Payer: Self-pay | Admitting: Pulmonary Disease

## 2023-10-21 ENCOUNTER — Telehealth: Payer: Self-pay | Admitting: Family

## 2023-10-21 ENCOUNTER — Ambulatory Visit (INDEPENDENT_AMBULATORY_CARE_PROVIDER_SITE_OTHER): Payer: Commercial Managed Care - PPO | Admitting: Pulmonary Disease

## 2023-10-21 ENCOUNTER — Other Ambulatory Visit (HOSPITAL_COMMUNITY): Payer: Self-pay

## 2023-10-21 VITALS — BP 118/72 | HR 67 | Temp 98.5°F | Ht 65.0 in | Wt 274.0 lb

## 2023-10-21 DIAGNOSIS — I10 Essential (primary) hypertension: Secondary | ICD-10-CM | POA: Diagnosis not present

## 2023-10-21 DIAGNOSIS — G4733 Obstructive sleep apnea (adult) (pediatric): Secondary | ICD-10-CM

## 2023-10-21 DIAGNOSIS — R0609 Other forms of dyspnea: Secondary | ICD-10-CM

## 2023-10-21 MED ORDER — ALBUTEROL SULFATE HFA 108 (90 BASE) MCG/ACT IN AERS: 2.0000 | INHALATION_SPRAY | Freq: Four times a day (QID) | RESPIRATORY_TRACT | 11 refills | Status: DC | PRN

## 2023-10-21 MED ORDER — BUDESONIDE-FORMOTEROL FUMARATE 160-4.5 MCG/ACT IN AERO: 2.0000 | INHALATION_SPRAY | Freq: Two times a day (BID) | RESPIRATORY_TRACT | 3 refills | Status: DC

## 2023-10-21 NOTE — Telephone Encounter (Signed)
Spoke with pt, pt is aware Rx has been sent in. Pt states she will call back to schedule and appointment at a later time if she feels the medication is not helping.

## 2023-10-21 NOTE — Progress Notes (Signed)
@Patient  ID: Amanda Mcintosh, female    DOB: 08/31/1971, 52 y.o.   MRN: [id number]  Chief Complaint  Patient presents with   Follow-up    Continuing to have SOB with anxiety attacks .  Having palpitations.  Never filled Symbicort.  Did not like sample of Breo.    Referring provider: Olive Bass,*  HPI:   52 y.o. woman whom we are seeing in follow up for shortness of breath, chest pressure/tightness/discomfort.  Multiple PCP notes reviewed.  Returns for follow-up, overdue.  Had tried Breo in the past for possible asthma.  Not helpful and funny taste in mouth.  Prescribed Symbicort.  Unfortunately she never picked this up.  Miscommunication it seems.  She has several episodes of what she describes as panic attacks.  Heavy breathing feels like cannot catch breath.  Similar chest discomfort.  Eventually was subsides.  She does think albuterol helps some.  Opens her up.  Helps some.  We discussed role and rationale for trying inhaler therapies, maintenance therapy ICS/LABA with Symbicort to help treat asthma.  See if this helps reduce frequency and severity of episodes.  Or at least improves her day-to-day symptoms of shortness of breath if present.  If not, would encourage her to continue to focus on efforts at treating anxiety with her PCP.  HPI at initial visit: Patient had cough for some time.  Months to years.  Most recently seen by ENT 03/2022.  Note reviewed.  She is received antibiotics twice this year, at least 1 time of prednisone.  CT scan of this year showed pansinusitis.  Cough not a major or chief complaint today.  Still an issue.  Chief complaint today is shortness of breath, tightness in chest.  Occurs with exertion.  Intermittently.  Not consistently reproducible with exertion.  Sometimes at rest.  With time improved.  No seasonal environmental factors to identify to make things better or worse.  No position make things better or worse.  No time of day when things are  better or worse.  The symptoms have been occurring for months.  Reviewed most recent chest x-rays 08/2021 with clear lungs bilaterally, and chest x-ray 02/2022 reviewed interpreted as clear lungs bilaterally in the setting of similar complaints.  She is not using any inhalers.   PMH: Tobacco abuse in remission, hypertension Surgical history: Cholecystectomy, tonsillectomy Family history: Mother with hypertension, diabetes, CVA, CHF, CAD, father with COPD, hypertension Social history: Smoker, quit early 2023, about half pack per day, lives in [place] / Pulmonary Flowsheets:   ACT:      No data to display          MMRC:     No data to display          Epworth:      No data to display          Tests:   FENO:  No results found for: "NITRICOXIDE"  PFT:    Latest Ref Rng & Units 09/14/2022   11:52 AM 05/04/2016    3:40 PM  PFT Results  FVC-Pre L 2.80  3.17   FVC-Predicted Pre % 76  101   FVC-Post L 2.64  3.07   FVC-Predicted Post % 72  97   Pre FEV1/FVC % % 85  88   Post FEV1/FCV % % 88  89   FEV1-Pre L 2.39  2.78   FEV1-Predicted Pre % 82  109   FEV1-Post L 2.31  2.73  DLCO uncorrected ml/min/mmHg 19.95  22.23   DLCO UNC% % 91  84   DLCO corrected ml/min/mmHg 19.95  22.81   DLCO COR %Predicted % 91  86   DLVA Predicted % 120  105   TLC L 4.26  4.55   TLC % Predicted % 80  86   RV % Predicted % 43  79   May 2017: Personally reviewed and interpreted as normal spirometry, no bronchodilator response, lung volumes within the limits, DLCO within normal limits  WALK:      No data to display          Imaging: Personally reviewed and as per EMR and discussion in this note No results found.  Lab Results: Reviewed CBC    Component Value Date/Time   WBC 10.4 04/12/2023 1351   RBC 4.53 04/12/2023 1351   HGB 13.2 04/12/2023 1351   HCT 39.8 04/12/2023 1351   PLT 374.0 04/12/2023 1351   MCV 87.8 04/12/2023 1351   MCH 30.0 04/03/2022  1240   MCHC 33.1 04/12/2023 1351   RDW 13.2 04/12/2023 1351   LYMPHSABS 3.8 04/12/2023 1351   MONOABS 0.8 04/12/2023 1351   EOSABS 0.2 04/12/2023 1351   BASOSABS 0.1 04/12/2023 1351    BMET    Component Value Date/Time   NA 140 06/28/2023 1236   NA 141 10/10/2022 1131   K 3.8 06/28/2023 1236   CL 104 06/28/2023 1236   CO2 29 06/28/2023 1236   GLUCOSE 104 (H) 06/28/2023 1236   BUN 14 06/28/2023 1236   BUN 14 10/10/2022 1131   CREATININE 0.83 06/28/2023 1236   CALCIUM 9.4 06/28/2023 1236   GFRNONAA >60 04/03/2022 1240   GFRAA >60 02/03/2019 0013    BNP No results found for: "BNP"  ProBNP No results found for: "PROBNP"  Specialty Problems       Pulmonary Problems   Chronic seasonal allergic rhinitis   Chronic sinusitis   OSA on CPAP   DOE (dyspnea on exertion)    Allergies  Allergen Reactions   Gadolinium Derivatives Other (See Comments)    PT STARTED SHAKING AND FELT VERY COLD INTERNALLY BUT FACE WAS VERY FLUSHED. THIS WAS A DELAYED REACTION (ABOUT 1 HOUR AFTER INJECTION). SHE TOOK BENADRYL AND HYDRATED AND SYMPTOMS SUBSIDED    Immunization History  Administered Date(s) Administered   PFIZER(Purple Top)SARS-COV-2 Vaccination 04/12/2020, 05/04/2020, 12/05/2020   PNEUMOCOCCAL CONJUGATE-20 12/12/2021   Tdap 08/31/2013    Past Medical History:  Diagnosis Date   Fibroids    Heartburn    History of headache    Hyperlipidemia    Hypertension    Obesity    Thyroid condition    Vitamin D deficiency     Tobacco History: Social History   Tobacco Use  Smoking Status Former   Current packs/day: 0.00   Types: Cigarettes   Quit date: 02/27/2021   Years since quitting: 2.6   Passive exposure: Past  Smokeless Tobacco Never  Tobacco Comments   Quit in Feb 27 2022. Tay 07/26/22   Counseling given: Not Answered Tobacco comments: Quit in Feb 27 2022. Tay 07/26/22   Continue to not smoke  Outpatient Encounter Medications as of 10/21/2023  Medication Sig    AMBULATORY NON FORMULARY MEDICATION Compression knee sleeve for pre-patellar bursitis Dispense 1 pre-patellar bursitis Use as needed   ciclopirox (PENLAC) 8 % solution Apply topically at bedtime. Apply over nail and surrounding skin. Apply daily over previous coat. After seven (7) days, may remove with alcohol  and continue cycle.   diclofenac Sodium (VOLTAREN) 1 % GEL Apply 4 g topically 4 (four) times daily. To affected joint.   fluticasone (FLONASE) 50 MCG/ACT nasal spray Place 1 spray into both nostrils in the morning and at bedtime. (Patient taking differently: Place 1 spray into both nostrils as needed.)   pantoprazole (PROTONIX) 40 MG tablet Take 40 mg by mouth daily.   tiZANidine (ZANAFLEX) 4 MG tablet TAKE 1 TABLET (4 MG TOTAL) BY MOUTH EVERY 8 (EIGHT) HOURS AS NEEDED FOR MUSCLE SPASMS   valsartan-hydrochlorothiazide (DIOVAN-HCT) 160-25 MG tablet Take 1 tablet by mouth daily.   [DISCONTINUED] albuterol (VENTOLIN HFA) 108 (90 Base) MCG/ACT inhaler Inhale 1-2 puffs into the lungs every 6 (six) hours as needed for wheezing or shortness of breath. (Patient taking differently: Inhale 1-2 puffs into the lungs as needed for wheezing or shortness of breath.)   albuterol (VENTOLIN HFA) 108 (90 Base) MCG/ACT inhaler Inhale 2 puffs into the lungs every 6 (six) hours as needed for wheezing or shortness of breath.   budesonide-formoterol (SYMBICORT) 160-4.5 MCG/ACT inhaler Inhale 2 puffs into the lungs 2 (two) times daily.   hydrOXYzine (VISTARIL) 25 MG capsule Take 1 capsule (25 mg total) by mouth every 8 (eight) hours as needed for anxiety. (Patient not taking: Reported on 10/21/2023)   [DISCONTINUED] budesonide-formoterol (SYMBICORT) 160-4.5 MCG/ACT inhaler Inhale 2 puffs into the lungs 2 (two) times daily. (Patient not taking: Reported on 10/21/2023)   Facility-Administered Encounter Medications as of 10/21/2023  Medication   diclofenac Sodium (VOLTAREN) 1 % topical gel 4 g     Review of  Systems  Review of Systems  N/a  Physical Exam  BP 118/72 (BP Location: Right Arm, Patient Position: Sitting, Cuff Size: Large)   Pulse 67   Temp 98.5 F (36.9 C) (Oral)   Ht 5\' 5"  (1.651 m)   Wt 274 lb (124.3 kg)   LMP 02/28/2017   SpO2 98%   BMI 45.60 kg/m   Wt Readings from Last 5 Encounters:  10/21/23 274 lb (124.3 kg)  09/16/23 269 lb 8 oz (122.2 kg)  07/26/23 268 lb 12.8 oz (121.9 kg)  07/09/23 269 lb (122 kg)  06/28/23 268 lb (121.6 kg)    BMI Readings from Last 5 Encounters:  10/21/23 45.60 kg/m  09/16/23 44.85 kg/m  07/26/23 44.73 kg/m  07/09/23 44.76 kg/m  06/28/23 44.60 kg/m     Physical Exam General: Well-appearing, in no acute distress Eyes: EOMI, no icterus Neck: Supple, no JVP Pulmonary: Clear, normal work of breathing Cardiovascular: Warm, no edema Abdomen: Nondistended, bowel sounds present MSK: No synovitis, no joint effusion Neuro: Normal gait, no weakness Psych: Normal mood, full affect   Assessment & Plan:   Chest tightness, discomfort, shortness of breath: Given description relatively high suspicion for asthma.  PFTs in 2017 normal.  Repeat PFTs 08/2022 normal.  Chest imaging clear.  Trial albuterol with some mild improvement.  High-dose ICS/LABA via Symbicort 2 puff twice daily prescribed today.  Adverse reaction to Breo, avoiding DPI given this.  Continue albuterol.  Both albuterol and Symbicort refilled.  History of chronic sinusitis: Not much of an issue today.  Continue to monitor.  OSA on CPAP: She feels better using CPAP machine, less headache etc.  She follows with a another provider for CPAP/OSA.  Encouraged to continue to use CPAP and follow-up with this office as she seems to be receiving significant clinical benefit from CPAP.   Return in about 3 months (around 01/21/2024) for f/u  Dr. Judeth Horn.   Karren Burly, MD 10/21/2023

## 2023-10-21 NOTE — Patient Instructions (Signed)
Nice to see you again  Try Symbicort 2 puffs twice a day - rinse mouth after every use  Use albuterol AS NEEDED (keep on your person) - 2 puffs  Return to clinic in 3 months or sooner as needed

## 2023-10-21 NOTE — Telephone Encounter (Signed)
Pt said she needs a refill on the anxiety medication that was prescribed before. Pt said she picked it up but never used it and now cannot find it. Pt would like a refill on that medication because she is going out of town tomorrow. Pt wants to discuss another anxiety medication as well. Pt was advised she may need an ov/vv to discuss changes in the anxiety medication but patient prefers a phone call from CMA first. [facility] [address].

## 2023-10-22 ENCOUNTER — Encounter: Payer: Self-pay | Admitting: Student

## 2023-10-22 LAB — LIPID PANEL
Chol/HDL Ratio: 5.2 {ratio} — ABNORMAL HIGH (ref 0.0–4.4)
Cholesterol, Total: 217 mg/dL — ABNORMAL HIGH (ref 100–199)
HDL: 42 mg/dL (ref >39)
LDL Chol Calc (NIH): 155 mg/dL — ABNORMAL HIGH (ref 0–99)
Triglycerides: 110 mg/dL (ref 0–149)
VLDL Cholesterol Cal: 20 mg/dL (ref 5–40)

## 2023-10-25 ENCOUNTER — Telehealth: Payer: Self-pay

## 2023-10-25 ENCOUNTER — Other Ambulatory Visit (HOSPITAL_COMMUNITY): Payer: Self-pay

## 2023-10-25 NOTE — Telephone Encounter (Signed)
Pharmacy Patient Advocate Encounter  Received notification from [facility] that Prior Authorization for Budesonide-Formoterol Fumarate 160-4.5MCG/ACT aerosol  has been CANCELLED due to   PA #/Case ID/Reference #: General.Closed by health plan.We thank you for taking the time to submit your request. However, this member has alternative pharmacy benefits. [employer] [facility] is the payer of last resort. Please have the pharmacy bill the member's other insurance plan first. If the member feels that this is in error, please have the member call Member Services at [phone]. If the requested medication is not covered or was denied by the [employer], an explanation of benefits or proof of denial must be submitted with the request prior to coverage by [employer] [facility].

## 2023-10-25 NOTE — Telephone Encounter (Signed)
*  Pulm  Pharmacy Patient Advocate Encounter   Received notification from CoverMyMeds that prior authorization for Budesonide-Formoterol Fumarate 160-4.5MCG/ACT aerosol  is required/requested.   Insurance verification completed.   The patient is insured through [facility] .   Per test claim: PA required; PA started via CoverMyMeds. KEY [id number] . Waiting for clinical questions to populate.

## 2023-10-25 NOTE — Telephone Encounter (Signed)
Clinical questions populated, answered and submitted

## 2023-10-26 DIAGNOSIS — G4733 Obstructive sleep apnea (adult) (pediatric): Secondary | ICD-10-CM | POA: Diagnosis not present

## 2023-11-18 ENCOUNTER — Telehealth (INDEPENDENT_AMBULATORY_CARE_PROVIDER_SITE_OTHER): Payer: Commercial Managed Care - PPO | Admitting: Family Medicine

## 2023-11-18 ENCOUNTER — Encounter: Payer: Self-pay | Admitting: Family Medicine

## 2023-11-18 VITALS — Temp 99.0°F | Ht 65.0 in | Wt 274.0 lb

## 2023-11-18 DIAGNOSIS — J069 Acute upper respiratory infection, unspecified: Secondary | ICD-10-CM

## 2023-11-18 MED ORDER — PREDNISONE 20 MG PO TABS
20.0000 mg | ORAL_TABLET | Freq: Every day | ORAL | 0 refills | Status: DC
Start: 2023-11-18 — End: 2024-01-08

## 2023-11-18 NOTE — Progress Notes (Signed)
Started yesterday evening Headache Stuffy Low grade fever Facial pressure  Taking Nyquil Vitamin C   DOES NOT want Augmentin  Covid negative

## 2023-11-18 NOTE — Progress Notes (Signed)
Virtual Video Visit via MyChart Note  I connected with  Amanda Mcintosh on 11/18/23 at  3:20 PM EST by the video enabled telemedicine application for MyChart, and verified that I am speaking with the correct person using two identifiers.   I introduced myself as a Publishing rights manager with the practice. We discussed the limitations of evaluation and management by telemedicine and the availability of in person appointments. The patient expressed understanding and agreed to proceed.  Participating parties in this visit include: The patient and the nurse practitioner listed.  The patient is: At home I am: In the office - [facility]  Subjective:    CC:  Chief Complaint  Patient presents with   Fever    HPI: Amanda Mcintosh is a 52 y.o. year old female presenting today via MyChart today for headache, nasal congestion.   Discussed the use of AI scribe software for clinical note transcription with the patient, who gave verbal consent to proceed.  History of Present Illness   The patient, with a history of recurrent sinusitis, presented with an acute onset of severe headache and facial pain, predominantly on the left side. The symptoms began last night after midnight following a normal day, and have been unresponsive to over-the-counter remedies including Nyquil, Mucinex, and a variety of vitamins. The patient also attempted nasal irrigation with a neti pot, but found no relief. The pain is described as a '10' on a scale of 0-10, affecting the entire face and the back of the head.  The patient denies any cough or sneezing, but reports sinus pressure and congestion. She also reports alternating episodes of feeling hot and cold, and cold feet despite wearing socks and being under covers. The patient has been trying to stay hydrated and has been consuming fluids including ginger tea.  The patient has not been around anyone who has been sick recently. Highest  temp today was 99.5 F. The patient notes that her blood pressure has been consistently controlled recently due to daily walking exercises.  The patient has also tested negative for COVID-19 using a home test kit. She has a history of bronchitis and is concerned about the current symptoms progressing to a chest infection.  The patient has previously been treated for similar symptoms with a Z-Pak prescribed by an ear, nose, and throat doctor, which she found effective. She has had less success with Augmentin, which she had to take multiple times without significant improvement.             Past medical history, Surgical history, Family history not pertinant except as noted below, Social history, Allergies, and medications have been entered into the medical record, reviewed, and corrections made.   Review of Systems:  All review of systems negative except what is listed in the HPI   Objective:    General:  Speaking clearly in complete sentences. Absent shortness of breath noted.   Alert and oriented x3.   Normal judgment.  Absent acute distress.   Impression and Recommendations:    Problem List Items Addressed This Visit   None Visit Diagnoses     Viral URI    -  Primary   Relevant Medications   predniSONE (DELTASONE) 20 MG tablet       Severe headache, facial pain, and sinus pressure. No fever, cough, or sneezing. Negative COVID-19 home test. No improvement with over-the-counter remedies including Mucinex, neti pot, and vitamins. -Start Prednisone  -Continue nasal irrigation and  hydration. -Advise against taking ibuprofen or Aleve while on Prednisone, but Tylenol is acceptable. -Repeat COVID-19 home test tomorrow. -Consider in-person appointment for flu testing if symptoms persist or worsen, especially if considering Tamiflu. -If no improvement by Friday, consider starting Z-Pak as this has worked well for her in the past - discussed antibiotic stewardship. -Provide  2-day work excuse via Clinical cytogeneticist.         Follow-up if symptoms worsen or fail to improve.    I discussed the assessment and treatment plan with the patient. The patient was provided an opportunity to ask questions and all were answered. The patient agreed with the plan and demonstrated an understanding of the instructions.   The patient was advised to call back or seek an in-person evaluation if the symptoms worsen or if the condition fails to improve as anticipated.   Clayborne Dana, NP

## 2023-11-20 ENCOUNTER — Telehealth: Payer: Self-pay | Admitting: Family

## 2023-11-20 DIAGNOSIS — J019 Acute sinusitis, unspecified: Secondary | ICD-10-CM

## 2023-11-20 DIAGNOSIS — R051 Acute cough: Secondary | ICD-10-CM

## 2023-11-20 MED ORDER — AZITHROMYCIN 250 MG PO TABS
ORAL_TABLET | ORAL | 0 refills | Status: AC
Start: 2023-11-20 — End: 2023-11-25

## 2023-11-20 NOTE — Telephone Encounter (Signed)
Please advise 

## 2023-11-20 NOTE — Telephone Encounter (Signed)
Patient made aware.

## 2023-11-20 NOTE — Telephone Encounter (Signed)
Patient called to say she is still not feeling better and wanted to know if the z pack could be called in for her. Her mucus is now green. [facility] [address]

## 2023-11-20 NOTE — Telephone Encounter (Signed)
Highly likely that this is still viral. Recommend she continue the supportive measures we discussed. If not making some progress by Friday/Saturday, she can try the Zpak.

## 2023-11-20 NOTE — Addendum Note (Signed)
Addended by: [name] B on: 11/20/2023 12:01 PM   Modules accepted: Orders

## 2023-11-21 MED ORDER — HYDROCOD POLI-CHLORPHE POLI ER 10-8 MG/5ML PO SUER
5.0000 mL | Freq: Two times a day (BID) | ORAL | 0 refills | Status: AC | PRN
Start: 2023-11-21 — End: 2023-11-26

## 2023-11-21 MED ORDER — BENZONATATE 100 MG PO CAPS
100.0000 mg | ORAL_CAPSULE | Freq: Two times a day (BID) | ORAL | 0 refills | Status: DC | PRN
Start: 2023-11-21 — End: 2024-02-13

## 2023-11-21 NOTE — Addendum Note (Signed)
Addended by: [name] B on: 11/21/2023 11:03 AM   Modules accepted: Orders

## 2023-11-21 NOTE — Telephone Encounter (Signed)
Patient made aware.

## 2023-11-21 NOTE — Telephone Encounter (Signed)
Patient called back, states she spoke with after hours about getting something to help with cough. She is not sleeping. She is taking Nyquil, Mucinex, and Tylenol. Please advise.

## 2023-12-24 ENCOUNTER — Ambulatory Visit: Payer: Commercial Managed Care - PPO | Admitting: Family

## 2024-01-07 NOTE — Progress Notes (Signed)
 LILLETTE Ileana Collet, PhD, LAT, ATC acting as a scribe for Artist Lloyd, MD.  Amanda Mcintosh is a 53 y.o. female who presents to [facility] today for L arm pain. Pt was previously seen by Dr. Lloyd on 06/19/23 for R hip pain.  Today, pt c/o L arm pain ongoing last week, Wednesday. Pt locates pain to the L-side of her neck, through L trapz, L shoulder, w/ radiating pain throughout L arm to hand. Pain seems to be worse at night.   Pain is worse with overhead motion and reaching back and at bedtime.  Pain is primarily located in the left shoulder and upper arm area.  Radiates: yes Aggravates: L-side lying Treatments tried: muscle relaxer  Pertinent review of systems: No fevers or chills  Relevant historical information: Sleep apnea and migraines.   Exam:  BP (!) 162/92   Pulse 69   Ht 5' 5 (1.651 m)   Wt 278 lb (126.1 kg)   LMP 02/28/2017   SpO2 98%   BMI 46.26 kg/m  General: Well Developed, well nourished, and in no acute distress.   MSK: C-spine: Normal appearing appearing Nontender palpation.  Normal cervical motion. Minimally positive left-sided Spurling's test. Upper extremity strength is intact except noted below.  Left shoulder: Normal-appearing Normal motion pain with abduction.  Tender palpation left trapezius. Upper extremity strength reduced abduction 4/5 and external rotation 4/5. Positive Hawkins and Neer's test.  Positive empty can test. Negative Yergason's and speeds test.  Lab and Radiology Results  Procedure: Real-time Ultrasound Guided Injection of left shoulder subacromial bursa Device: Philips Affiniti 50G/GE Logiq Images permanently stored and available for review in PACS Verbal informed consent obtained.  Discussed risks and benefits of procedure. Warned about infection, bleeding, hyperglycemia damage to structures among others. Patient expresses understanding and agreement Time-out conducted.   Noted no overlying  erythema, induration, or other signs of local infection.   Skin prepped in a sterile fashion.   Local anesthesia: Topical Ethyl chloride.   With sterile technique and under real time ultrasound guidance: 40 mg of Kenalog and 2 mL of Marcaine injected into subacromial bursa. Fluid seen entering the bursa.   Completed without difficulty   Pain immediately resolved suggesting accurate placement of the medication.   Advised to call if fevers/chills, erythema, induration, drainage, or persistent bleeding.   Images permanently stored and available for review in the ultrasound unit.  Impression: Technically successful ultrasound guided injection.    X-ray images C-spine obtained today personally and independently interpreted Loss of cervical lordosis indicating cervical muscle spasm.  No acute fractures.  No severe DJD. Await formal radiology review    Assessment and Plan: 53 y.o. 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  Symptoms are multifactorial.  Primary pain generator today is subacromial impingement/bursa.  Plan for steroid injection subacromial bursa today.  Consider physical therapy referral or even advanced imaging C-spine to assess for cervical radiculopathy more accurately. Potentially MRI of the shoulder for surgical or further injection planning as well could be done in the future.  PDMP not reviewed this encounter. Orders Placed This Encounter  Procedures   DG Cervical Spine 2 or 3 views    Standing Status:   Future    Number of Occurrences:   1    Expiration Date:   01/07/2025    Reason for Exam (SYMPTOM  OR DIAGNOSIS REQUIRED):   neck pain  Is patient pregnant?:   No    Preferred imaging location?:   [place]   US  LIMITED JOINT SPACE STRUCTURES UP LEFT(NO LINKED CHARGES)    Reason for Exam (SYMPTOM  OR DIAGNOSIS REQUIRED):   left shoulder pain    Preferred imaging  location?:   [facility]   No orders of the defined types were placed in this encounter.    Discussed warning signs or symptoms. Please see discharge instructions. Patient expresses understanding.   The above documentation has been reviewed and is accurate and complete Artist Lloyd, M.D.

## 2024-01-08 ENCOUNTER — Other Ambulatory Visit: Payer: Self-pay

## 2024-01-08 ENCOUNTER — Ambulatory Visit: Payer: Commercial Managed Care - PPO | Admitting: Family Medicine

## 2024-01-08 ENCOUNTER — Ambulatory Visit (INDEPENDENT_AMBULATORY_CARE_PROVIDER_SITE_OTHER): Payer: Commercial Managed Care - PPO

## 2024-01-08 ENCOUNTER — Ambulatory Visit: Payer: Commercial Managed Care - PPO | Admitting: Pulmonary Disease

## 2024-01-08 VITALS — BP 162/92 | HR 69 | Ht 65.0 in | Wt 278.0 lb

## 2024-01-08 DIAGNOSIS — G8929 Other chronic pain: Secondary | ICD-10-CM | POA: Diagnosis not present

## 2024-01-08 DIAGNOSIS — M542 Cervicalgia: Secondary | ICD-10-CM | POA: Diagnosis not present

## 2024-01-08 DIAGNOSIS — M25512 Pain in left shoulder: Secondary | ICD-10-CM

## 2024-01-08 DIAGNOSIS — M79602 Pain in left arm: Secondary | ICD-10-CM | POA: Diagnosis not present

## 2024-01-08 DIAGNOSIS — M5412 Radiculopathy, cervical region: Secondary | ICD-10-CM | POA: Diagnosis not present

## 2024-01-08 NOTE — Patient Instructions (Addendum)
Thank you for coming in today.   Please get an Xray today before you leave   You received an injection today. Seek immediate medical attention if the joint becomes red, extremely painful, or is oozing fluid.   Check back as needed

## 2024-01-09 NOTE — Progress Notes (Signed)
 Cervical spine x-ray looks normal to radiology.  No fractures are visible.  No severe arthritis is present.

## 2024-02-12 ENCOUNTER — Ambulatory Visit: Payer: Commercial Managed Care - PPO | Admitting: Pulmonary Disease

## 2024-02-12 ENCOUNTER — Encounter: Payer: Self-pay | Admitting: Pulmonary Disease

## 2024-02-12 VITALS — BP 112/64 | HR 83 | Temp 98.2°F | Ht 65.0 in | Wt 277.8 lb

## 2024-02-12 DIAGNOSIS — J4520 Mild intermittent asthma, uncomplicated: Secondary | ICD-10-CM | POA: Diagnosis not present

## 2024-02-12 MED ORDER — ALBUTEROL SULFATE HFA 108 (90 BASE) MCG/ACT IN AERS: 2.0000 | INHALATION_SPRAY | Freq: Four times a day (QID) | RESPIRATORY_TRACT | 11 refills | Status: AC | PRN

## 2024-02-12 NOTE — Progress Notes (Signed)
@Patient  ID: Amanda Mcintosh, female    DOB: 08-Oct-1971, 53 y.o.   MRN: [id number]  Chief Complaint  Patient presents with   Follow-up    3 month follow up.  Doing well.    Referring provider: Olive Bass,*  HPI:   53 y.o. woman whom we are seeing in follow up for shortness of breath, chest pressure/tightness/discomfort.  Most recent PCP note x 2 reviewed.  Most recent neurology note reviewed.  Returns for routine follow-up.  Doing well.  Breathing stable.  No exacerbations.  Recent cold used albuterol some.  But day-to-day rare use.  No longer using ICS/LABA therapy.  Well-controlled disease off of maintenance inhalers.  HPI at initial visit: Patient had cough for some time.  Months to years.  Most recently seen by ENT 03/2022.  Note reviewed.  She is received antibiotics twice this year, at least 1 time of prednisone.  CT scan of this year showed pansinusitis.  Cough not a major or chief complaint today.  Still an issue.  Chief complaint today is shortness of breath, tightness in chest.  Occurs with exertion.  Intermittently.  Not consistently reproducible with exertion.  Sometimes at rest.  With time improved.  No seasonal environmental factors to identify to make things better or worse.  No position make things better or worse.  No time of day when things are better or worse.  The symptoms have been occurring for months.  Reviewed most recent chest x-rays 08/2021 with clear lungs bilaterally, and chest x-ray 02/2022 reviewed interpreted as clear lungs bilaterally in the setting of similar complaints.  She is not using any inhalers.   PMH: Tobacco abuse in remission, hypertension Surgical history: Cholecystectomy, tonsillectomy Family history: Mother with hypertension, diabetes, CVA, CHF, CAD, father with COPD, hypertension Social history: Smoker, quit early 2023, about half pack per day, lives in [place] / Pulmonary Flowsheets:   ACT:      No data  to display          MMRC:     No data to display          Epworth:      No data to display          Tests:   FENO:  No results found for: "NITRICOXIDE"  PFT:    Latest Ref Rng & Units 09/14/2022   11:52 AM 05/04/2016    3:40 PM  PFT Results  FVC-Pre L 2.80  3.17   FVC-Predicted Pre % 76  101   FVC-Post L 2.64  3.07   FVC-Predicted Post % 72  97   Pre FEV1/FVC % % 85  88   Post FEV1/FCV % % 88  89   FEV1-Pre L 2.39  2.78   FEV1-Predicted Pre % 82  109   FEV1-Post L 2.31  2.73   DLCO uncorrected ml/min/mmHg 19.95  22.23   DLCO UNC% % 91  84   DLCO corrected ml/min/mmHg 19.95  22.81   DLCO COR %Predicted % 91  86   DLVA Predicted % 120  105   TLC L 4.26  4.55   TLC % Predicted % 80  86   RV % Predicted % 43  79   May 2017: Personally reviewed and interpreted as normal spirometry, no bronchodilator response, lung volumes within the limits, DLCO within normal limits  WALK:      No data to display          Imaging:  Personally reviewed and as per EMR and discussion in this note No results found.  Lab Results: Reviewed CBC    Component Value Date/Time   WBC 10.4 04/12/2023 1351   RBC 4.53 04/12/2023 1351   HGB 13.2 04/12/2023 1351   HCT 39.8 04/12/2023 1351   PLT 374.0 04/12/2023 1351   MCV 87.8 04/12/2023 1351   MCH 30.0 04/03/2022 1240   MCHC 33.1 04/12/2023 1351   RDW 13.2 04/12/2023 1351   LYMPHSABS 3.8 04/12/2023 1351   MONOABS 0.8 04/12/2023 1351   EOSABS 0.2 04/12/2023 1351   BASOSABS 0.1 04/12/2023 1351    BMET    Component Value Date/Time   NA 140 06/28/2023 1236   NA 141 10/10/2022 1131   K 3.8 06/28/2023 1236   CL 104 06/28/2023 1236   CO2 29 06/28/2023 1236   GLUCOSE 104 (H) 06/28/2023 1236   BUN 14 06/28/2023 1236   BUN 14 10/10/2022 1131   CREATININE 0.83 06/28/2023 1236   CALCIUM 9.4 06/28/2023 1236   GFRNONAA >60 04/03/2022 1240   GFRAA >60 02/03/2019 0013    BNP No results found for: "BNP"  ProBNP No  results found for: "PROBNP"  Specialty Problems       Pulmonary Problems   Chronic seasonal allergic rhinitis   Chronic sinusitis   OSA on CPAP   DOE (dyspnea on exertion)    Allergies  Allergen Reactions   Crestor [Rosuvastatin Calcium] Other (See Comments)    Severe myalgia   Gadolinium Derivatives Other (See Comments)    PT STARTED SHAKING AND FELT VERY COLD INTERNALLY BUT FACE WAS VERY FLUSHED. THIS WAS A DELAYED REACTION (ABOUT 1 HOUR AFTER INJECTION). SHE TOOK BENADRYL AND HYDRATED AND SYMPTOMS SUBSIDED    Immunization History  Administered Date(s) Administered   PFIZER(Purple Top)SARS-COV-2 Vaccination 04/12/2020, 05/04/2020, 12/05/2020   PNEUMOCOCCAL CONJUGATE-20 12/12/2021   Tdap 08/31/2013    Past Medical History:  Diagnosis Date   Fibroids    Heartburn    History of headache    Hyperlipidemia    Hypertension    Obesity    Thyroid condition    Vitamin D deficiency     Tobacco History: Social History   Tobacco Use  Smoking Status Former   Current packs/day: 0.00   Types: Cigarettes   Quit date: 02/27/2021   Years since quitting: 2.9   Passive exposure: Past  Smokeless Tobacco Never  Tobacco Comments   Quit in Feb 27 2022. Tay 07/26/22   Counseling given: Not Answered Tobacco comments: Quit in Feb 27 2022. Tay 07/26/22   Continue to not smoke  Outpatient Encounter Medications as of 02/12/2024  Medication Sig   albuterol (VENTOLIN HFA) 108 (90 Base) MCG/ACT inhaler Inhale 2 puffs into the lungs every 6 (six) hours as needed for wheezing or shortness of breath.   AMBULATORY NON FORMULARY MEDICATION Compression knee sleeve for pre-patellar bursitis Dispense 1 pre-patellar bursitis Use as needed   benzonatate (TESSALON) 100 MG capsule Take 1 capsule (100 mg total) by mouth 2 (two) times daily as needed for cough. (Patient not taking: Reported on 01/08/2024)   budesonide-formoterol (SYMBICORT) 160-4.5 MCG/ACT inhaler Inhale 2 puffs into the lungs 2  (two) times daily.   ciclopirox (PENLAC) 8 % solution Apply topically at bedtime. Apply over nail and surrounding skin. Apply daily over previous coat. After seven (7) days, may remove with alcohol and continue cycle.   diclofenac Sodium (VOLTAREN) 1 % GEL Apply 4 g topically 4 (four) times daily. To affected  joint.   fluticasone (FLONASE) 50 MCG/ACT nasal spray Place 1 spray into both nostrils in the morning and at bedtime. (Patient taking differently: Place 1 spray into both nostrils as needed.)   hydrOXYzine (VISTARIL) 25 MG capsule TAKE 1 CAPSULE (25 MG TOTAL) BY MOUTH EVERY 8 (EIGHT) HOURS AS NEEDED FOR ANXIETY.   pantoprazole (PROTONIX) 40 MG tablet Take 40 mg by mouth daily.   tiZANidine (ZANAFLEX) 4 MG tablet TAKE 1 TABLET (4 MG TOTAL) BY MOUTH EVERY 8 (EIGHT) HOURS AS NEEDED FOR MUSCLE SPASMS   valsartan-hydrochlorothiazide (DIOVAN-HCT) 160-25 MG tablet Take 1 tablet by mouth daily.   Facility-Administered Encounter Medications as of 02/12/2024  Medication   diclofenac Sodium (VOLTAREN) 1 % topical gel 4 g     Review of Systems  Review of Systems  N/a  Physical Exam  BP 112/64 (BP Location: Left Arm, Patient Position: Sitting, Cuff Size: Large)   Pulse 83   Temp 98.2 F (36.8 C) (Oral)   Ht 5\' 5"  (1.651 m)   Wt 277 lb 12.8 oz (126 kg)   LMP 02/28/2017   SpO2 99%   BMI 46.23 kg/m   Wt Readings from Last 5 Encounters:  02/12/24 277 lb 12.8 oz (126 kg)  01/08/24 278 lb (126.1 kg)  11/18/23 274 lb (124.3 kg)  10/21/23 274 lb (124.3 kg)  09/16/23 269 lb 8 oz (122.2 kg)    BMI Readings from Last 5 Encounters:  02/12/24 46.23 kg/m  01/08/24 46.26 kg/m  11/18/23 45.60 kg/m  10/21/23 45.60 kg/m  09/16/23 44.85 kg/m     Physical Exam General: Well-appearing, in no acute distress Eyes: EOMI, no icterus Neck: Supple, no JVP Pulmonary: Clear, normal work of breathing Cardiovascular: Warm, no edema Abdomen: Nondistended, bowel sounds present MSK: No synovitis,  no joint effusion Neuro: Normal gait, no weakness Psych: Normal mood, full affect   Assessment & Plan:   Chest tightness, discomfort, shortness of breath: Given description relatively high suspicion for asthma.  PFTs in 2017 normal.  Repeat PFTs 08/2022 normal.  Chest imaging clear.  Trial albuterol with some mild improvement.  High-dose ICS/LABA via Symbicort 2 puff twice daily prescribed with improvement in the past.  Adverse reaction to Breo, avoiding DPI given this.  Continue albuterol, refilled today.  Okay to stay off Symbicort since doing well, if symptoms recur or worsen instructed to resume Symbicort, she expressed understanding.  History of chronic sinusitis: Not much of an issue today.  Continue to monitor.    Return in about 1 year (around 02/11/2025).   Karren Burly, MD 02/12/2024

## 2024-02-13 ENCOUNTER — Ambulatory Visit: Payer: Commercial Managed Care - PPO | Admitting: Family

## 2024-02-13 ENCOUNTER — Encounter: Payer: Self-pay | Admitting: Family

## 2024-02-13 VITALS — BP 114/66 | HR 81 | Ht 65.0 in | Wt 278.2 lb

## 2024-02-13 DIAGNOSIS — R7303 Prediabetes: Secondary | ICD-10-CM | POA: Diagnosis not present

## 2024-02-13 DIAGNOSIS — M62838 Other muscle spasm: Secondary | ICD-10-CM | POA: Diagnosis not present

## 2024-02-13 NOTE — Progress Notes (Signed)
Amanda Mcintosh is a 53 y.o. female with the following history as recorded in EpicCare:  Patient Active Problem List   Diagnosis Date Noted   Dermatofibroma of left lower leg 06/27/2023   Pre-diabetes 01/16/2023   Other hyperlipidemia 10/17/2022   Vitamin D deficiency 08/28/2022   Prediabetes 08/28/2022   Insulin resistance 08/28/2022   Depression 08/28/2022   Essential hypertension 07/27/2022   Precordial pain 07/27/2022   Family history of CHF (congestive heart failure) 07/27/2022   Palpitations 07/27/2022   DOE (dyspnea on exertion) 07/27/2022   Hyperlipidemia due to dietary fat intake 07/27/2022   Metabolic syndrome 07/27/2022   OSA on CPAP 06/24/2019   Low back pain 04/30/2019   Dizziness 07/04/2017   Chronic seasonal allergic rhinitis 01/25/2017   Chronic sinusitis 01/25/2017   Lateral epicondylitis 01/13/2016   Amenorrhea 06/28/2015   Situational syncope 05/07/2015   GERD (gastroesophageal reflux disease) 04/21/2015   Routine general medical examination at a health care facility 04/21/2015   Iron deficiency anemia 12/02/2013   Migraine headache without aura 03/11/2013   Family history of early CAD 09/08/2012    Current Outpatient Medications  Medication Sig Dispense Refill   albuterol (VENTOLIN HFA) 108 (90 Base) MCG/ACT inhaler Inhale 2 puffs into the lungs every 6 (six) hours as needed for wheezing or shortness of breath. 1 each 11   AMBULATORY NON FORMULARY MEDICATION Compression knee sleeve for pre-patellar bursitis Dispense 1 pre-patellar bursitis Use as needed 1 Units 0   budesonide-formoterol (SYMBICORT) 160-4.5 MCG/ACT inhaler Inhale 2 puffs into the lungs 2 (two) times daily. 1 each 3   ciclopirox (PENLAC) 8 % solution Apply topically at bedtime. Apply over nail and surrounding skin. Apply daily over previous coat. After seven (7) days, may remove with alcohol and continue cycle. 6.6 mL 0   diclofenac Sodium (VOLTAREN) 1 % GEL Apply 4 g topically 4 (four)  times daily. To affected joint. 100 g 11   fluticasone (FLONASE) 50 MCG/ACT nasal spray Place 1 spray into both nostrils in the morning and at bedtime. (Patient taking differently: Place 1 spray into both nostrils as needed.) 9.9 mL 2   hydrOXYzine (VISTARIL) 25 MG capsule TAKE 1 CAPSULE (25 MG TOTAL) BY MOUTH EVERY 8 (EIGHT) HOURS AS NEEDED FOR ANXIETY. 30 capsule 0   pantoprazole (PROTONIX) 40 MG tablet Take 40 mg by mouth daily.     tiZANidine (ZANAFLEX) 4 MG tablet TAKE 1 TABLET (4 MG TOTAL) BY MOUTH EVERY 8 (EIGHT) HOURS AS NEEDED FOR MUSCLE SPASMS 90 tablet 1   valsartan-hydrochlorothiazide (DIOVAN-HCT) 160-25 MG tablet Take 1 tablet by mouth daily. 90 tablet 3   benzonatate (TESSALON) 100 MG capsule Take 1 capsule (100 mg total) by mouth 2 (two) times daily as needed for cough. (Patient not taking: Reported on 02/13/2024) 20 capsule 0   Current Facility-Administered Medications  Medication Dose Route Frequency Provider Last Rate Last Admin   diclofenac Sodium (VOLTAREN) 1 % topical gel 4 g  4 g Topical QID Rodolph Bong, MD        Allergies: Crestor [rosuvastatin calcium] and Gadolinium derivatives  Past Medical History:  Diagnosis Date   Fibroids    Heartburn    History of headache    Hyperlipidemia    Hypertension    Obesity    Thyroid condition    Vitamin D deficiency     Past Surgical History:  Procedure Laterality Date   CHOLECYSTECTOMY  12/31/2004   Pulmonary function tests  09/14/2022   Normal  lung volumes and DLCO.  Normal spirometry.   ROBOT ASSISTED MYOMECTOMY  12/31/2009   Stress Echocardiogram  08/2012   Exercise to max HR 164 bpm - 93% MPHR 177 bpm.  Normal BP and HR response.  No electrical or echocardiographic evidence of exercise-induced ischemia.  Normal EF, baseline > 55% with no RWMA.  Peak stress LVEF increased to 65-70% with no RWMA.  LOW RISK.   TONSILLECTOMY     TRANSTHORACIC ECHOCARDIOGRAM  08/15/2022   NORMAL.  EF 55 to 60%.  No RWMA.  Normal diastolic  parameters.  Unable to assess PAP and otherwise normal RV.  Normal AOV and MV.  Mildly elevated RAP.   WISDOM TOOTH EXTRACTION      Family History  Problem Relation Age of Onset   Diabetes Mother    Hypertension Mother    Heart failure Mother    Stroke Mother    High Cholesterol Mother    Depression Mother    Anxiety disorder Mother    Sleep apnea Mother    Obesity Mother    Obstructive Sleep Apnea Mother    Coronary artery disease Mother 31       s/p PCI   Diabetes Mellitus II Mother    Heart attack Mother 10   COPD Father        smoker   High blood pressure Father    High Cholesterol Father    Breast cancer Sister    Skin cancer Sister    HIV/AIDS Brother    Heart disease Brother 39       "enlarged heart" = presented with Acute Resp Failure   Hyperlipidemia Other    Migraines Neg Hx     Social History   Tobacco Use   Smoking status: Former    Current packs/day: 0.00    Types: Cigarettes    Quit date: 02/27/2021    Years since quitting: 2.9    Passive exposure: Past   Smokeless tobacco: Never   Tobacco comments:    Quit in Feb 27 2022. Tay 07/26/22  Substance Use Topics   Alcohol use: No    Subjective:   Concerned about 3 week history of muscle cramps in legs; notes that she has been taking her blood pressure medications regularly recently; has been trying to drink more water recently- did notice benefit recently after using OTC IV packet;      Objective:  Vitals:   02/13/24 1328  BP: 114/66  Pulse: 81  SpO2: 98%  Weight: 278 lb 3.2 oz (126.2 kg)  Height: 5\' 5"  (1.651 m)    General: Well developed, well nourished, in no acute distress  Skin : Warm and dry.  Head: Normocephalic and atraumatic  Eyes: Sclera and conjunctiva clear; pupils round and reactive to light; extraocular movements intact  Ears: External normal; canals clear; tympanic membranes normal  Oropharynx: Pink, supple. No suspicious lesions  Neck: Supple without thyromegaly, adenopathy   Lungs: Respirations unlabored; clear to auscultation bilaterally without wheeze, rales, rhonchi  CVS exam: normal rate and regular rhythm.  Musculoskeletal: No deformities; no active joint inflammation  Extremities: No edema, cyanosis, clubbing  Vessels: Symmetric bilaterally  Neurologic: Alert and oriented; speech intact; face symmetrical; moves all extremities well; CNII-XII intact without focal deficit   Assessment:  1. Muscle spasms of both lower extremities   2. Pre-diabetes     Plan:  Will update labs today; follow up to be determined;  Patient notes she will be transferring her care to [facility] as our office is difficult for her to drive to- she prefers a location closer to her home; expressed understanding and wish her all the best for the future.   No follow-ups on file.  Orders Placed This Encounter  Procedures   Comp Met (CMET)   CBC with Differential/Platelet   B12   Magnesium   Hemoglobin A1c    Requested Prescriptions    No prescriptions requested or ordered in this encounter

## 2024-02-14 ENCOUNTER — Other Ambulatory Visit: Payer: Self-pay | Admitting: Family

## 2024-02-14 DIAGNOSIS — E119 Type 2 diabetes mellitus without complications: Secondary | ICD-10-CM

## 2024-02-14 LAB — CBC WITH DIFFERENTIAL/PLATELET
Basophils Absolute: 0.1 10*3/uL (ref 0.0–0.1)
Basophils Relative: 0.7 % (ref 0.0–3.0)
Eosinophils Absolute: 0.1 10*3/uL (ref 0.0–0.7)
Eosinophils Relative: 0.8 % (ref 0.0–5.0)
HCT: 40.8 % (ref 36.0–46.0)
Hemoglobin: 13.6 g/dL (ref 12.0–15.0)
Lymphocytes Relative: 33.5 % (ref 12.0–46.0)
Lymphs Abs: 4 10*3/uL (ref 0.7–4.0)
MCHC: 33.3 g/dL (ref 30.0–36.0)
MCV: 88.5 fL (ref 78.0–100.0)
Monocytes Absolute: 0.8 10*3/uL (ref 0.1–1.0)
Monocytes Relative: 6.9 % (ref 3.0–12.0)
Neutro Abs: 7 10*3/uL (ref 1.4–7.7)
Neutrophils Relative %: 58.1 % (ref 43.0–77.0)
Platelets: 396 10*3/uL (ref 150.0–400.0)
RBC: 4.61 Mil/uL (ref 3.87–5.11)
RDW: 13.2 % (ref 11.5–15.5)
WBC: 12 10*3/uL — ABNORMAL HIGH (ref 4.0–10.5)

## 2024-02-14 LAB — COMPREHENSIVE METABOLIC PANEL
ALT: 20 U/L (ref 0–35)
AST: 12 U/L (ref 0–37)
Albumin: 4.1 g/dL (ref 3.5–5.2)
Alkaline Phosphatase: 76 U/L (ref 39–117)
BUN: 13 mg/dL (ref 6–23)
CO2: 30 meq/L (ref 19–32)
Calcium: 9.2 mg/dL (ref 8.4–10.5)
Chloride: 100 meq/L (ref 96–112)
Creatinine, Ser: 0.9 mg/dL (ref 0.40–1.20)
GFR: 72.36 mL/min (ref >60.00)
Glucose, Bld: 119 mg/dL — ABNORMAL HIGH (ref 70–99)
Potassium: 3.8 meq/L (ref 3.5–5.1)
Sodium: 139 meq/L (ref 135–145)
Total Bilirubin: 0.3 mg/dL (ref 0.2–1.2)
Total Protein: 6.8 g/dL (ref 6.0–8.3)

## 2024-02-14 LAB — HEMOGLOBIN A1C: Hgb A1c MFr Bld: 7.6 % — ABNORMAL HIGH (ref 4.6–6.5)

## 2024-02-14 LAB — MAGNESIUM: Magnesium: 1.9 mg/dL (ref 1.5–2.5)

## 2024-02-14 LAB — VITAMIN B12: Vitamin B-12: >1537 pg/mL — ABNORMAL HIGH (ref 211–911)

## 2024-02-14 MED ORDER — VALSARTAN-HYDROCHLOROTHIAZIDE 160-25 MG PO TABS
1.0000 | ORAL_TABLET | Freq: Every day | ORAL | 0 refills | Status: DC
Start: 2024-02-14 — End: 2024-05-01

## 2024-02-14 MED ORDER — METFORMIN HCL ER 500 MG PO TB24: 500.0000 mg | ORAL_TABLET | Freq: Every day | ORAL | 0 refills | Status: DC

## 2024-02-17 ENCOUNTER — Telehealth: Payer: Self-pay

## 2024-02-17 NOTE — Telephone Encounter (Signed)
Copied from CRM [id number]. Topic: Clinical - Lab/Test Results >> Feb 17, 2024  9:03 AM Fuller Mandril wrote: Reason for CRM: Patient called states she received the message about starting medication for diabetes. States she ate a candy bar before test and  would possibly like to be retested. Thank You

## 2024-02-18 NOTE — Telephone Encounter (Signed)
Spoke with pt, pt is aware and expressed understanding. Pt has an appointment scheduled with a nutritionist 04/01/2024.

## 2024-02-19 ENCOUNTER — Other Ambulatory Visit: Payer: Self-pay | Admitting: Neurology

## 2024-02-19 ENCOUNTER — Other Ambulatory Visit: Payer: Self-pay | Admitting: Family

## 2024-02-19 ENCOUNTER — Encounter: Payer: Self-pay | Admitting: Family

## 2024-02-19 DIAGNOSIS — E119 Type 2 diabetes mellitus without complications: Secondary | ICD-10-CM

## 2024-02-19 MED ORDER — BLOOD GLUCOSE MONITORING SUPPL DEVI: 1.0000 | Freq: Three times a day (TID) | 0 refills | Status: AC

## 2024-02-19 MED ORDER — BLOOD GLUCOSE TEST VI STRP
1.0000 | ORAL_STRIP | Freq: Three times a day (TID) | 0 refills | Status: DC
Start: 2024-02-19 — End: 2024-04-29

## 2024-02-19 MED ORDER — LANCET DEVICE MISC: 1.0000 | Freq: Three times a day (TID) | 0 refills | Status: DC

## 2024-02-19 MED ORDER — LANCETS MISC. MISC
1.0000 | Freq: Three times a day (TID) | 0 refills | Status: AC
Start: 2024-02-19 — End: 2024-03-20

## 2024-02-19 NOTE — Telephone Encounter (Signed)
LVM letting patient know RX for glucose monitor sent to pharmacy, can get blood pressure monitor as well. If has specific questions about diabetes management advised to schedule a visit to discuss with provider.

## 2024-02-19 NOTE — Telephone Encounter (Signed)
Pended Glucose meter. Please sign if appropriate.    Copied from CRM [id number]. Topic: Clinical - Medication Question >> Feb 19, 2024 12:41 PM Amanda Mcintosh wrote: Reason for CRM: Amanda Mcintosh called and stated that she was recently diagnosed with Diabetes. However, she is unsure how to check her blood sugar since she wasn't prescribed a monitor. She also mentioned that Her appt for a nutritionist is not until 04/02. Additionally, she explained that she has high blood pressure and needs to monitor that as well. Please call and advise.

## 2024-02-21 DIAGNOSIS — G4733 Obstructive sleep apnea (adult) (pediatric): Secondary | ICD-10-CM | POA: Diagnosis not present

## 2024-02-24 ENCOUNTER — Telehealth: Payer: Self-pay

## 2024-02-24 NOTE — Telephone Encounter (Signed)
 Called pt and left a VM to inform pt that labs are needed before supplement can be sent in. Advised pt to give the office a call back to let us know which lab pt prefers to go to.

## 2024-02-24 NOTE — Telephone Encounter (Signed)
 Copied from CRM [id number]. Topic: Clinical - Medical Advice >> Feb 24, 2024 10:42 AM Fredrich Romans wrote: Reason for CRM: Patient called in stating that she doesn't have an energy and feels very exhausted,so she thinks that her vitaminD is low and she thought that it was checked when she had her labs done on 02/13/2024.She is wondering if she could have a prescription for vitamin D sent to pharmacy .

## 2024-02-25 ENCOUNTER — Other Ambulatory Visit: Payer: Self-pay | Admitting: Family

## 2024-02-25 ENCOUNTER — Telehealth: Payer: Self-pay

## 2024-02-25 DIAGNOSIS — D729 Disorder of white blood cells, unspecified: Secondary | ICD-10-CM

## 2024-02-25 DIAGNOSIS — E559 Vitamin D deficiency, unspecified: Secondary | ICD-10-CM

## 2024-02-25 NOTE — Progress Notes (Signed)
 Care Guide Pharmacy Note  02/25/2024 Name: Amanda Mcintosh MRN: [id number] DOB: 11/30/1971  Referred By: Olive Bass, FNP Reason for referral: Care Coordination (Outreach to schedule with Pharm d )   Amanda Mcintosh is a 53 y.o. year old female who is a primary care patient of Olive Bass, FNP.  [name] was referred to the pharmacist for assistance related to: DMII  Successful contact was made with the patient to discuss pharmacy services including being ready for the pharmacist to call at least 5 minutes before the scheduled appointment time and to have medication bottles and any blood pressure readings ready for review. The patient agreed to meet with the pharmacist via telephone visit on (date/time).02/27/2024  Penne Lash , RMA     [place]  [facility], [facility] Guide  Direct Dial: [phone]  Website: [email or web address]

## 2024-02-25 NOTE — Telephone Encounter (Signed)
 Copied from CRM [id number]. Topic: Clinical - Medical Advice >> Feb 24, 2024 10:42 AM Fredrich Romans wrote: Reason for CRM: Patient called in stating that she doesn't have an energy and feels very exhausted,so she thinks that her vitaminD is low and she thought that it was checked when she had her labs done on 02/13/2024.She is wondering if she could have a prescription for vitamin D sent to pharmacy . >> Feb 25, 2024 12:56 PM Irine Seal wrote: Patient stated she would go to the lab on [address] tomorrow

## 2024-02-26 ENCOUNTER — Other Ambulatory Visit (INDEPENDENT_AMBULATORY_CARE_PROVIDER_SITE_OTHER): Payer: Commercial Managed Care - PPO

## 2024-02-26 ENCOUNTER — Ambulatory Visit (INDEPENDENT_AMBULATORY_CARE_PROVIDER_SITE_OTHER): Payer: Commercial Managed Care - PPO | Admitting: Podiatry

## 2024-02-26 DIAGNOSIS — E559 Vitamin D deficiency, unspecified: Secondary | ICD-10-CM

## 2024-02-26 DIAGNOSIS — D729 Disorder of white blood cells, unspecified: Secondary | ICD-10-CM | POA: Diagnosis not present

## 2024-02-26 DIAGNOSIS — L600 Ingrowing nail: Secondary | ICD-10-CM | POA: Diagnosis not present

## 2024-02-26 LAB — CBC WITH DIFFERENTIAL/PLATELET
Basophils Absolute: 0.1 10*3/uL (ref 0.0–0.1)
Basophils Relative: 0.6 % (ref 0.0–3.0)
Eosinophils Absolute: 0.1 10*3/uL (ref 0.0–0.7)
Eosinophils Relative: 1 % (ref 0.0–5.0)
HCT: 40.7 % (ref 36.0–46.0)
Hemoglobin: 13.3 g/dL (ref 12.0–15.0)
Lymphocytes Relative: 33.7 % (ref 12.0–46.0)
Lymphs Abs: 3.8 10*3/uL (ref 0.7–4.0)
MCHC: 32.7 g/dL (ref 30.0–36.0)
MCV: 89.1 fL (ref 78.0–100.0)
Monocytes Absolute: 1 10*3/uL (ref 0.1–1.0)
Monocytes Relative: 8.5 % (ref 3.0–12.0)
Neutro Abs: 6.4 10*3/uL (ref 1.4–7.7)
Neutrophils Relative %: 56.2 % (ref 43.0–77.0)
Platelets: 403 10*3/uL — ABNORMAL HIGH (ref 150.0–400.0)
RBC: 4.57 Mil/uL (ref 3.87–5.11)
RDW: 13 % (ref 11.5–15.5)
WBC: 11.3 10*3/uL — ABNORMAL HIGH (ref 4.0–10.5)

## 2024-02-26 LAB — VITAMIN D 25 HYDROXY (VIT D DEFICIENCY, FRACTURES): VITD: 23.85 ng/mL — ABNORMAL LOW (ref 30.00–100.00)

## 2024-02-26 NOTE — Telephone Encounter (Signed)
 Pt called back stating she would like labs done at [facility] lab. Lab orders are in place.

## 2024-02-26 NOTE — Progress Notes (Unsigned)
 Subjective:  Patient ID: Amanda Mcintosh, female    DOB: 1971-12-04,  MRN: [id number]  Chief Complaint  Patient presents with   Nail Problem    Pt stated that she is having a lot of discomfort with her nail on her big toe     53 y.o. female presents with the above complaint.  Patient presents with left hallux medial border ingrown painful to touch is progressive gotten worse worse with ambulation worse with pressure she would like to remove it is causing a lot of discomfort denies any other acute complaints   Review of Systems: Negative except as noted in the HPI. Denies N/V/F/Ch.  Past Medical History:  Diagnosis Date   Fibroids    Heartburn    History of headache    Hyperlipidemia    Hypertension    Obesity    Thyroid condition    Vitamin D deficiency     Current Outpatient Medications:    albuterol (VENTOLIN HFA) 108 (90 Base) MCG/ACT inhaler, Inhale 2 puffs into the lungs every 6 (six) hours as needed for wheezing or shortness of breath., Disp: 1 each, Rfl: 11   AMBULATORY NON FORMULARY MEDICATION, Compression knee sleeve for pre-patellar bursitis Dispense 1 pre-patellar bursitis Use as needed, Disp: 1 Units, Rfl: 0   Blood Glucose Monitoring Suppl DEVI, 1 each by Does not apply route in the morning, at noon, and at bedtime. May substitute to any manufacturer covered by patient's insurance., Disp: 1 each, Rfl: 0   budesonide-formoterol (SYMBICORT) 160-4.5 MCG/ACT inhaler, Inhale 2 puffs into the lungs 2 (two) times daily. (Patient not taking: Reported on 02/27/2024), Disp: 1 each, Rfl: 3   ciclopirox (PENLAC) 8 % solution, Apply topically at bedtime. Apply over nail and surrounding skin. Apply daily over previous coat. After seven (7) days, may remove with alcohol and continue cycle., Disp: 6.6 mL, Rfl: 0   diclofenac Sodium (VOLTAREN) 1 % GEL, Apply 4 g topically 4 (four) times daily. To affected joint., Disp: 100 g, Rfl: 11   fluticasone (FLONASE) 50 MCG/ACT nasal spray,  Place 1 spray into both nostrils in the morning and at bedtime. (Patient taking differently: Place 1 spray into both nostrils as needed.), Disp: 9.9 mL, Rfl: 2   Glucose Blood (BLOOD GLUCOSE TEST STRIPS) STRP, 1 each by In Vitro route in the morning, at noon, and at bedtime. May substitute to any manufacturer covered by patient's insurance., Disp: 100 strip, Rfl: 0   hydrOXYzine (VISTARIL) 25 MG capsule, TAKE 1 CAPSULE (25 MG TOTAL) BY MOUTH EVERY 8 (EIGHT) HOURS AS NEEDED FOR ANXIETY. (Patient not taking: Reported on 02/27/2024), Disp: 30 capsule, Rfl: 0   Lancets Misc. MISC, 1 each by Does not apply route in the morning, at noon, and at bedtime. May substitute to any manufacturer covered by patient's insurance., Disp: 100 each, Rfl: 0   metFORMIN (GLUCOPHAGE-XR) 500 MG 24 hr tablet, Take 1 tablet (500 mg total) by mouth daily with breakfast., Disp: 90 tablet, Rfl: 0   pantoprazole (PROTONIX) 40 MG tablet, Take 40 mg by mouth daily as needed., Disp: , Rfl:    tiZANidine (ZANAFLEX) 4 MG tablet, TAKE 1 TABLET (4 MG TOTAL) BY MOUTH EVERY 8 (EIGHT) HOURS AS NEEDED FOR MUSCLE SPASMS, Disp: 90 tablet, Rfl: 1   valsartan-hydrochlorothiazide (DIOVAN-HCT) 160-25 MG tablet, Take 1 tablet by mouth daily., Disp: 90 tablet, Rfl: 0   Vitamin D, Ergocalciferol, (DRISDOL) 1.25 MG (50000 UNIT) CAPS capsule, Take 1 capsule (50,000 Units total) by mouth every  7 (seven) days for 12 doses. (Patient not taking: Reported on 02/27/2024), Disp: 12 capsule, Rfl: 0  Current Facility-Administered Medications:    diclofenac Sodium (VOLTAREN) 1 % topical gel 4 g, 4 g, Topical, QID, Rodolph Bong, MD  Social History   Tobacco Use  Smoking Status Former   Current packs/day: 0.00   Types: Cigarettes   Quit date: 02/27/2021   Years since quitting: 3.0   Passive exposure: Past  Smokeless Tobacco Never  Tobacco Comments   Quit in Feb 27 2022. Tay 07/26/22    Allergies  Allergen Reactions   Crestor [Rosuvastatin Calcium] Other  (See Comments)    Severe myalgia   Gadolinium Derivatives Other (See Comments)    PT STARTED SHAKING AND FELT VERY COLD INTERNALLY BUT FACE WAS VERY FLUSHED. THIS WAS A DELAYED REACTION (ABOUT 1 HOUR AFTER INJECTION). SHE TOOK BENADRYL AND HYDRATED AND SYMPTOMS SUBSIDED   Objective:  There were no vitals filed for this visit. There is no height or weight on file to calculate BMI. Constitutional Well developed. Well nourished.  Vascular Dorsalis pedis pulses palpable bilaterally. Posterior tibial pulses palpable bilaterally. Capillary refill normal to all digits.  No cyanosis or clubbing noted. Pedal hair growth normal.  Neurologic Normal speech. Oriented to person, place, and time. Epicritic sensation to light touch grossly present bilaterally.  Dermatologic Painful ingrowing nail at medial nail borders of the hallux nail left. No other open wounds. No skin lesions.  Orthopedic: Normal joint ROM without pain or crepitus bilaterally. No visible deformities. No bony tenderness.   Radiographs: None Assessment:   1. Ingrown left big toenail    Plan:  Patient was evaluated and treated and all questions answered.  Ingrown Nail, left -Patient elects to proceed with minor surgery to remove ingrown toenail removal today. Consent reviewed and signed by patient. -Ingrown nail excised. See procedure note. -Educated on post-procedure care including soaking. Written instructions provided and reviewed. -Patient to follow up in 2 weeks for nail check.  Procedure: Excision of Ingrown Toenail Location: Left 1st toe medial nail borders. Anesthesia: Lidocaine 1% plain; 1.5 mL and Marcaine 0.5% plain; 1.5 mL, digital block. Skin Prep: Betadine. Dressing: Silvadene; telfa; dry, sterile, compression dressing. Technique: Following skin prep, the toe was exsanguinated and a tourniquet was secured at the base of the toe. The affected nail border was freed, split with a nail splitter, and excised.  Chemical matrixectomy was then performed with phenol and irrigated out with alcohol. The tourniquet was then removed and sterile dressing applied. Disposition: Patient tolerated procedure well. Patient to return in 2 weeks for follow-up.   No follow-ups on file.

## 2024-02-26 NOTE — Telephone Encounter (Signed)
 Spoke with pt, pt is aware the lab orders are in place. Advised pt she is able to go at her convenience.

## 2024-02-27 ENCOUNTER — Other Ambulatory Visit: Payer: Self-pay | Admitting: Pharmacist

## 2024-02-27 ENCOUNTER — Encounter: Payer: Self-pay | Admitting: Pharmacist

## 2024-02-27 ENCOUNTER — Encounter: Payer: Self-pay | Admitting: Family

## 2024-02-27 ENCOUNTER — Other Ambulatory Visit: Payer: Self-pay | Admitting: Family

## 2024-02-27 MED ORDER — VITAMIN D (ERGOCALCIFEROL) 1.25 MG (50000 UNIT) PO CAPS: 50000.0000 [IU] | ORAL_CAPSULE | ORAL | 0 refills | Status: DC

## 2024-02-27 NOTE — Progress Notes (Signed)
 02/27/2024 Name: Amanda Mcintosh MRN: [id number] DOB: 10-08-1971  Chief Complaint  Patient presents with   Diabetes   Medication Management    Amanda Mcintosh is a 53 y.o. year old female who presented for a telephone visit.   They were referred to the pharmacist by their PCP for assistance in managing diabetes.    Subjective:  Care Team: Primary Care Provider: Olive Bass, FNP ; Next Scheduled Visit: not scheduled - patient will transition over to new PCP - Dr Alvy Bimler 04/13/2024 Pulmonologist Dr Judeth Horn - last visit yesteday 02/26/2024 Neurologist: Shawnie Dapper; Next Scheduled Visit: 09/24/2024 Diabetes Nutrition Consults 04/01/2024 - initial visit  Medication Access/Adherence  Current Pharmacy:  [id number] - [place], [place] - [address] [place] [address] Phone: [phone] Fax: [phone]  [id number] [place], [place] - [address] [place] [address] [place] [address] [place] [phone] Phone: [phone] Fax: [phone]  [id number] - [place], [place] - [address]  [place] [address] Phone: [phone] Fax: [phone]   Patient reports affordability concerns with their medications: No  Patient reports access/transportation concerns to their pharmacy: No  Patient reports adherence concerns with their medications:  No      Diabetes: Newly diagnosed type 2 diabetes Has been noted to have pre diabetes in the past  Current medications:  started metformin ER 500mg  once daily 02/14/2024.  Patient reports she is tolerating metformin without GI issues as long as she takes with food as instructed Medications tried in the past:none   Current glucose readings: none yet - she has Accu-Chek Guide Me glucometer but has not started using yet. She requests assistance with first fingerstick today.   Patient denies  hypoglycemic s/sx including no dizziness, shakiness, sweating. Patient denies hyperglycemic symptoms including no polyuria, polydipsia, polyphagia, nocturia, neuropathy, blurred vision.  Current meal patterns:  Not following diet plan currently but trying to limit bread and increase fiber.   Current physical activity: Walking on treatmill about 3 to 4 times a day until recently due to having ingrown toenail removed by podiatrist this week.    Objective:  Lab Results  Component Value Date   HGBA1C 7.6 (H) 02/13/2024    Lab Results  Component Value Date   CREATININE 0.90 02/13/2024   BUN 13 02/13/2024   NA 139 02/13/2024   K 3.8 02/13/2024   CL 100 02/13/2024   CO2 30 02/13/2024    Lab Results  Component Value Date   CHOL 217 (H) 10/21/2023   HDL 42 10/21/2023   LDLCALC 155 (H) 10/21/2023   LDLDIRECT 139.0 09/06/2016   TRIG 110 10/21/2023   CHOLHDL 5.2 (H) 10/21/2023    Medications Reviewed Today     Reviewed by Henrene Pastor, RPH-CPP (Pharmacist) on 02/27/24 at 1728  Med List Status: <None>   Medication Order Taking? Sig Documenting Provider Last Dose Status Informant  albuterol (VENTOLIN HFA) 108 (90 Base) MCG/ACT inhaler [id number] Yes Inhale 2 puffs into the lungs every 6 (six) hours as needed for wheezing or shortness of breath. Hunsucker, Lesia Sago, MD Taking Active   AMBULATORY Clent [place]ark MEDICATION [id number] Yes Compression knee sleeve for pre-patellar bursitis Dispense 1 pre-patellar bursitis Use as needed Rodolph Bong, MD Taking Active   Blood Glucose Monitoring Suppl DEVI [id number] Yes 1 each by Does not apply route in the morning, at noon, and at bedtime. May substitute  to any manufacturer covered by patient's insurance. Olive Bass, FNP Taking Active   budesonide-formoterol [facility]) 160-4.5 MCG/ACT inhaler [id number] No Inhale 2 puffs into the lungs 2 (two) times daily.  Patient not taking: Reported on 02/27/2024   Karren Burly, MD Not  Taking Active   ciclopirox [facility]) 8 % solution [id number] Yes Apply topically at bedtime. Apply over nail and surrounding skin. Apply daily over previous coat. After seven (7) days, may remove with alcohol and continue cycle. Candelaria Stagers, DPM Taking Active   diclofenac Sodium (VOLTAREN) 1 % GEL [id number] Yes Apply 4 g topically 4 (four) times daily. To affected joint. Rodolph Bong, MD Taking Active   diclofenac Sodium (VOLTAREN) 1 % topical gel 4 g [id number]   Rodolph Bong, MD  Active   fluticasone [facility]) 50 MCG/ACT nasal spray [id number]  Place 1 spray into both nostrils in the morning and at bedtime.  Patient taking differently: Place 1 spray into both nostrils as needed.   Kommor, Madison, MD  Active   Glucose Blood (BLOOD GLUCOSE TEST STRIPS) STRP [id number] Yes 1 each by In Vitro route in the morning, at noon, and at bedtime. May substitute to any manufacturer covered by patient's insurance. Olive Bass, FNP Taking Active   hydrOXYzine (VISTARIL) 25 MG capsule [id number] No TAKE 1 CAPSULE (25 MG TOTAL) BY MOUTH EVERY 8 (EIGHT) HOURS AS NEEDED FOR ANXIETY.  Patient not taking: Reported on 02/27/2024   Olive Bass, FNP Not Taking Active   Lancets Misc. MISC [id number] Yes 1 each by Does not apply route in the morning, at noon, and at bedtime. May substitute to any manufacturer covered by patient's insurance. Olive Bass, FNP Taking Active   metFORMIN (GLUCOPHAGE-XR) 500 MG 24 hr tablet [id number] Yes Take 1 tablet (500 mg total) by mouth daily with breakfast. Olive Bass, FNP Taking Active   pantoprazole (PROTONIX) 40 MG tablet [id number] Yes Take 40 mg by mouth daily as needed. [provider] Taking Active   tiZANidine (ZANAFLEX) 4 MG tablet [id number] Yes TAKE 1 TABLET (4 MG TOTAL) BY MOUTH EVERY 8 (EIGHT) HOURS AS NEEDED FOR MUSCLE SPASMS Rodolph Bong, MD Taking Active   valsartan-hydrochlorothiazide (DIOVAN-HCT) 160-25 MG tablet [id number]  Yes Take 1 tablet by mouth daily. Olive Bass, FNP Taking Active   Vitamin D, Ergocalciferol, (DRISDOL) 1.25 MG (50000 UNIT) CAPS capsule [id number] No Take 1 capsule (50,000 Units total) by mouth every 7 (seven) days for 12 doses.  Patient not taking: Reported on 02/27/2024   Olive Bass, FNP Not Taking Active               Assessment/Plan:   Diabetes: newly diagnosed - goal A1c given patient's age is 6.5 to 7.0% - Reviewed long term cardiovascular and renal outcomes of uncontrolled blood sugar - Reviewed goal A1c, goal fasting, and goal 2 hour post prandial glucose - Reviewed dietary modifications - discussed using ADA plate method for meal planning. Recommended limiting CHO and sugar intake. Provided link fro ADA plate method and superstar food handout.  - Reviewed lifestyle modifications including: Restarting walking on treadmill or other exercise at leat 150 minutes per week once ingrown toenail healed.  - Recommend to continue metformin ER 500mg  daily  - Recommend to check glucose once a day at varying times of day.  Walked patient thru setting time and date on glucometer and thru her first blood glucose reading.  - Reviewed home blood glucose readings and  reviewed goals  Fasting blood glucose goal (before meals) = 80 to 130 Blood glucose goal after a meal = less than 180      Follow Up Plan: in 2 weeks (after time change to help with updating glucometer and check blood glucose readings / review diet)   Henrene Pastor, PharmD Clinical Pharmacist [facility] Primary Care  Population Health [phone]

## 2024-02-27 NOTE — Patient Instructions (Signed)
 Mrs. Massaro,  It was a pleasure speaking with you today.   Blood glucose goal we discussed are:  Fasting blood glucose goal (before meals) = 80 to 130 Blood glucose goal after a meal = less than 180  A1c goal (3 month test done in clinic) - 6.5 to 7% (your last A1c was 7.6%)  These are links that will help with meal planning.   American Diabetes Superstar Foods:  https://diabetes.org/food-nutrition/food-and-blood-sugar/diabetes-superstar-foods  American Diabetes Plate Method:  https://diabetes.org/food-nutrition/meal-planning   As always if you have any questions or concerns especially regarding medications, please feel free to contact me either at the phone number below or with a MyChart message.   Keep up the good work!  Henrene Pastor, PharmD Clinical Pharmacist [facility] Primary Care [facility] [phone] (direct line)  [phone] (main office number)

## 2024-03-09 ENCOUNTER — Ambulatory Visit: Payer: Commercial Managed Care - PPO | Admitting: Pharmacist

## 2024-03-09 DIAGNOSIS — I10 Essential (primary) hypertension: Secondary | ICD-10-CM

## 2024-03-09 DIAGNOSIS — E119 Type 2 diabetes mellitus without complications: Secondary | ICD-10-CM

## 2024-03-09 MED ORDER — OMRON 3 SERIES BP MONITOR DEVI: 0 refills | Status: AC

## 2024-03-09 NOTE — Progress Notes (Signed)
 03/09/2024 Name: Amanda Mcintosh MRN: [id number] DOB: 03-18-1971  Chief Complaint  Patient presents with   Diabetes    Amanda Mcintosh is a 53 y.o. year old female who presented for a telephone visit.   They were referred to the pharmacist by their PCP for assistance in managing diabetes.    Subjective:  Care Team: Primary Care Provider: Olive Bass, FNP ; Next Scheduled Visit: not scheduled - patient will transition over to new PCP - Dr Alvy Bimler 04/13/2024 Pulmonologist Dr Judeth Horn - last visit yesteday 02/26/2024 Neurologist: Shawnie Dapper; Next Scheduled Visit: 09/24/2024 Diabetes Nutrition Consult 04/01/2024 - initial visit  Medication Access/Adherence  Current Pharmacy:  [id number] - [place], [place] - [address] [place] [address] Phone: [phone] Fax: [phone]  [id number] - [place], [place] - [address] [place] [address] [address] [place] [phone] Phone: [phone] Fax: [phone]  [id number] [place], [place] - [address] [place] [address] Phone: [phone] Fax: [phone]   Patient reports affordability concerns with their medications: No  Patient reports access/transportation concerns to their pharmacy: No  Patient reports adherence concerns with their medications:  No      Diabetes: Newly diagnosed type 2 diabetes Had been noted to have pre diabetes in the past  Current medications:  started metformin ER 500mg  once daily 02/14/2024.  Patient reports she is tolerating metformin without GI issues as long as she takes with food as instructed.  Medications tried in the past:none   Current glucose readings: 125, 126, 107, 152, 126, 135, 101, 111, 129, 134, 109   Patient denies hypoglycemic s/sx including no dizziness, shakiness, sweating. Patient denies hyperglycemic symptoms including  no polyuria, polydipsia, polyphagia, nocturia, neuropathy, blurred vision.  Current meal patterns:  She has been limiting intake of high CHO and sugar foods.  Trying to limit bread and increase fiber.   Current physical activity: Walking on treatmill about 3 to 4 times a day until recently due to having ingrown toenail removed by podiatrist this week.    Objective:  Lab Results  Component Value Date   HGBA1C 7.6 (H) 02/13/2024    Lab Results  Component Value Date   CREATININE 0.90 02/13/2024   BUN 13 02/13/2024   NA 139 02/13/2024   K 3.8 02/13/2024   CL 100 02/13/2024   CO2 30 02/13/2024    Lab Results  Component Value Date   CHOL 217 (H) 10/21/2023   HDL 42 10/21/2023   LDLCALC 155 (H) 10/21/2023   LDLDIRECT 139.0 09/06/2016   TRIG 110 10/21/2023   CHOLHDL 5.2 (H) 10/21/2023    Medications Reviewed Today     Reviewed by Henrene Pastor, RPH-CPP (Pharmacist) on 03/09/24 at 1655  Med List Status: <None>   Medication Order Taking? Sig Documenting Provider Last Dose Status Informant  albuterol (VENTOLIN HFA) 108 (90 Base) MCG/ACT inhaler [id number] Yes Inhale 2 puffs into the lungs every 6 (six) hours as needed for wheezing or shortness of breath. Hunsucker, Lesia Sago, MD Taking Active   AMBULATORY Clent [place] MEDICATION [id number] Yes Compression knee sleeve for pre-patellar bursitis Dispense 1 pre-patellar bursitis Use as needed Rodolph Bong, MD Taking Active   Blood Glucose Monitoring Suppl DEVI [id number] Yes 1 each by Does not apply route in the morning, at noon, and at bedtime. May substitute to any manufacturer covered by patient's insurance. [place] Scrape,  Allyne Gee, FNP Taking Active   Blood Pressure Monitoring (OMRON 3 SERIES BP MONITOR) DEVI [id number]  Use to check blood pressure daily. Olive Bass, FNP  Active   budesonide-formoterol [facility]) 160-4.5 MCG/ACT inhaler [id number] No Inhale 2 puffs into the lungs 2 (two) times daily.  Patient not taking:  Reported on 03/09/2024   Hunsucker, Lesia Sago, MD Not Taking Active   ciclopirox [facility]) 8 % solution [id number] Yes Apply topically at bedtime. Apply over nail and surrounding skin. Apply daily over previous coat. After seven (7) days, may remove with alcohol and continue cycle. Candelaria Stagers, DPM Taking Active   diclofenac Sodium (VOLTAREN) 1 % GEL [id number] Yes Apply 4 g topically 4 (four) times daily. To affected joint. Rodolph Bong, MD Taking Active   diclofenac Sodium (VOLTAREN) 1 % topical gel 4 g [id number]   Rodolph Bong, MD  Consider Medication Status and Discontinue (Change in therapy)   fluticasone (FLONASE) 50 MCG/ACT nasal spray [id number] Yes Place 1 spray into both nostrils in the morning and at bedtime.  Patient taking differently: Place 1 spray into both nostrils as needed.   Kommor, Madison, MD Taking Active   Glucose Blood (BLOOD GLUCOSE TEST STRIPS) STRP [id number] Yes 1 each by In Vitro route in the morning, at noon, and at bedtime. May substitute to any manufacturer covered by patient's insurance. Olive Bass, FNP Taking Active   hydrOXYzine (VISTARIL) 25 MG capsule [id number] Yes TAKE 1 CAPSULE (25 MG TOTAL) BY MOUTH EVERY 8 (EIGHT) HOURS AS NEEDED FOR ANXIETY. Olive Bass, FNP Taking Active   Lancets Misc. MISC [id number] Yes 1 each by Does not apply route in the morning, at noon, and at bedtime. May substitute to any manufacturer covered by patient's insurance. Olive Bass, FNP Taking Active   metFORMIN (GLUCOPHAGE-XR) 500 MG 24 hr tablet [id number] Yes Take 1 tablet (500 mg total) by mouth daily with breakfast. Olive Bass, FNP Taking Active   pantoprazole (PROTONIX) 40 MG tablet [id number] Yes Take 40 mg by mouth daily as needed. [provider] Taking Active   tiZANidine (ZANAFLEX) 4 MG tablet [id number]  TAKE 1 TABLET (4 MG TOTAL) BY MOUTH EVERY 8 (EIGHT) HOURS AS NEEDED FOR MUSCLE SPASMS Rodolph Bong, MD  Active    valsartan-hydrochlorothiazide (DIOVAN-HCT) 160-25 MG tablet [id number] Yes Take 1 tablet by mouth daily. Olive Bass, FNP Taking Active   Vitamin D, Ergocalciferol, (DRISDOL) 1.25 MG (50000 UNIT) CAPS capsule [id number] Yes Take 1 capsule (50,000 Units total) by mouth every 7 (seven) days for 12 doses. Olive Bass, FNP Taking Active               Assessment/Plan:   Diabetes: newly diagnosed - goal A1c given patient's age is 6.5 to 7.0% - Reviewed goal A1c, goal fasting, and goal 2 hour post prandial glucose - Reviewed dietary modifications  - Reviewed lifestyle modifications including: Restarting walking on treadmill or other exercise at leat 150 minutes per week once ingrown toenail healed.  - Recommend to continue metformin ER 500mg  daily  - Recommend to check glucose once a day at varying times of day.  - Reviewed home blood glucose readings and reviewed goals  Fasting blood glucose goal (before meals) = 80 to 130 Blood glucose goal after a meal = less than 180   Hypertension:  - discussed blood pressure goal of < 130/80 - continue valsartan daily - Rx sent for blood pressure cuff. Reviewed proper use of  home blood pressure cuff.   Follow Up Plan: 4 to 6 weeks.   Henrene Pastor, PharmD Clinical Pharmacist [facility] Primary Care  Population Health [phone]

## 2024-04-01 ENCOUNTER — Encounter: Payer: Self-pay | Admitting: Skilled Nursing Facility1

## 2024-04-01 ENCOUNTER — Encounter: Payer: Commercial Managed Care - PPO | Attending: Family | Admitting: Skilled Nursing Facility1

## 2024-04-01 VITALS — Ht 65.0 in | Wt 271.4 lb

## 2024-04-01 DIAGNOSIS — E119 Type 2 diabetes mellitus without complications: Secondary | ICD-10-CM | POA: Insufficient documentation

## 2024-04-01 NOTE — Progress Notes (Unsigned)
 A1C 7.6  DM medications: Metformin  Other Diagnosed: HTN Hyperlipidemia  Thyroid  OSA  Pt states she has been checking her blood sugars daily since her diagnosis: 2-3 hours post prandial 113, 91, 132 Pt states she recently started therapy which has been going well so far.  Pt states she has an upcoming vacation to [place] she is excited about.  Pt state she might check out a piliates class. Pt states she has her treadmill ready so she cam start walking daily again.  Pt states she works 4:30 am to 5-6pm.  Pt states she plans on looking for a stand up desk.   Diabetes Self-Management Education  Visit Type: First/Initial  Appt. Start Time: 4:32 Appt. End Time: 5:35  04/02/2024  Amanda Mcintosh, identified by name and date of birth, is a 53 y.o. female with a diagnosis of Diabetes: Type 2.   ASSESSMENT  Height 5\' 5"  (1.651 m), weight 271 lb 6.4 oz (123.1 kg), last menstrual period 02/28/2017. Body mass index is 45.16 kg/m.   Diabetes Self-Management Education - 04/01/24 1643       Visit Information   Visit Type First/Initial      Initial Visit   Diabetes Type Type 2    Are you currently following a meal plan? No    Are you taking your medications as prescribed? Yes      Health Coping   How would you rate your overall health? Good      Psychosocial Assessment   Patient Belief/Attitude about Diabetes Afraid    What is the hardest part about your diabetes right now, causing you the most concern, or is the most worrisome to you about your diabetes?   Making healty food and beverage choices    Self-management support Family    Other persons present Spouse/SO    Patient Concerns Nutrition/Meal planning;Weight Control    Special Needs None    Learning Readiness Contemplating    How often do you need to have someone help you when you read instructions, pamphlets, or other written materials from your doctor or pharmacy? 1 - Never      Pre-Education Assessment    Patient understands the diabetes disease and treatment process. Needs Instruction    Patient understands incorporating nutritional management into lifestyle. Needs Instruction    Patient undertands incorporating physical activity into lifestyle. Needs Instruction    Patient understands using medications safely. Needs Instruction    Patient understands monitoring blood glucose, interpreting and using results Needs Instruction    Patient understands prevention, detection, and treatment of acute complications. Needs Instruction    Patient understands prevention, detection, and treatment of chronic complications. Needs Instruction    Patient understands how to develop strategies to address psychosocial issues. Needs Instruction    Patient understands how to develop strategies to promote health/change behavior. Needs Instruction      Complications   Last HgB A1C per patient/outside source 7.6 %    How often do you check your blood sugar? 1-2 times/day    Postprandial Blood glucose range (mg/dL) 40-981    Number of hypoglycemic episodes per month 0    Have you had a dilated eye exam in the past 12 months? Yes    Have you had a dental exam in the past 12 months? Yes    Are you checking your feet? Yes    How many days per week are you checking your feet? 7      Dietary Intake  Breakfast skipped or tuely bacon and eggs    Snack (morning) chips    Lunch skipped or pizza    Dinner salmon + calfoer rice + greens    Beverage(s) water, hot tea      Activity / Exercise   Activity / Exercise Type ADL's    How many days per week do you exercise? 0    How many minutes per day do you exercise? 0    Total minutes per week of exercise 0      Patient Education   Previous Diabetes Education No    Disease Pathophysiology Definition of diabetes, type 1 and 2, and the diagnosis of diabetes;Factors that contribute to the development of diabetes    Healthy Eating Plate Method;Carbohydrate counting;Food  label reading, portion sizes and measuring food.;Role of diet in the treatment of diabetes and the relationship between the three main macronutrients and blood glucose level;Information on hints to eating out and maintain blood glucose control.    Being Active Role of exercise on diabetes management, blood pressure control and cardiac health.;Helped patient identify appropriate exercises in relation to his/her diabetes, diabetes complications and other health issue.    Medications Reviewed patients medication for diabetes, action, purpose, timing of dose and side effects.    Monitoring Taught/evaluated SMBG meter.;Daily foot exams;Yearly dilated eye exam;Interpreting lab values - A1C, lipid, urine microalbumina.    Chronic complications Dental care;Retinopathy and reason for yearly dilated eye exams;Nephropathy, what it is, prevention of, the use of ACE, ARB's and early detection of through urine microalbumia.;Lipid levels, blood glucose control and heart disease;Assessed and discussed foot care and prevention of foot problems    Diabetes Stress and Support Role of stress on diabetes      Individualized Goals (developed by patient)   Nutrition Follow meal plan discussed;General guidelines for healthy choices and portions discussed    Physical Activity Exercise 5-7 days per week;30 minutes per day    Medications take my medication as prescribed    Monitoring  Test blood glucose pre and post meals as discussed    Problem Solving Eating Pattern      Post-Education Assessment   Patient understands the diabetes disease and treatment process. Demonstrates understanding / competency    Patient understands incorporating nutritional management into lifestyle. Demonstrates understanding / competency    Patient undertands incorporating physical activity into lifestyle. Demonstrates understanding / competency    Patient understands using medications safely. Demonstrates understanding / competency    Patient  understands monitoring blood glucose, interpreting and using results Demonstrates understanding / competency    Patient understands prevention, detection, and treatment of acute complications. Demonstrates understanding / competency    Patient understands prevention, detection, and treatment of chronic complications. Demonstrates understanding / competency    Patient understands how to develop strategies to address psychosocial issues. Demonstrates understanding / competency    Patient understands how to develop strategies to promote health/change behavior. Demonstrates understanding / competency      Outcomes   Expected Outcomes Demonstrated interest in learning. Expect positive outcomes    Future DMSE 4-6 wks    Program Status Completed             Individualized Plan for Diabetes Self-Management Training:   Learning Objective:  Patient will have a greater understanding of diabetes self-management. Patient education plan is to attend individual and/or group sessions per assessed needs and concerns.    Expected Outcomes:  Demonstrated interest in learning. Expect positive outcomes  Education material provided:  ADA - How to Thrive: A Guide for Your Journey with Diabetes, Food label handouts, Meal plan card, My Plate, and Snack sheet  If problems or questions, patient to contact team via:  Phone and Email  Future DSME appointment: 4-6 wks

## 2024-04-06 ENCOUNTER — Telehealth: Payer: Self-pay

## 2024-04-06 NOTE — Telephone Encounter (Signed)
 Copied from CRM [id number]. Topic: Clinical - Prescription Issue >> Apr 06, 2024  8:27 AM Gaetano Hawthorne wrote: Reason for CRM: Patient's pharmacy notified the patient that Albuterol inhaler is not covered by insurance - patient would like to know if there's an alternative inhaler that Dr. Judeth Horn could prescribe. [facility]: [id number] - [place], [place] - [address]  Phone: [phone] Fax: [phone]   Please advise with formulary alternatives for Albuterol (Ventolin HFA). Routing to pharmacy and Dr. Judeth Horn.

## 2024-04-07 ENCOUNTER — Other Ambulatory Visit (HOSPITAL_COMMUNITY): Payer: Self-pay

## 2024-04-07 ENCOUNTER — Telehealth: Payer: Self-pay

## 2024-04-07 NOTE — Telephone Encounter (Signed)
 Brand Ventolin HFA preferred and covered under both plans for $4.00

## 2024-04-07 NOTE — Telephone Encounter (Signed)
 Waiting for Dr. Judeth Horn to advise

## 2024-04-07 NOTE — Telephone Encounter (Signed)
 Brand Ventolin preferred and covered under both plans for $4.00 copay

## 2024-04-08 NOTE — Telephone Encounter (Signed)
 The medicine is covered as above $4 copay. The pharmacy is mistaken.

## 2024-04-09 NOTE — Telephone Encounter (Signed)
 Spoke with Amanda Mcintosh. Informed pt albuterol inhaler should be $4 copay. Pt verbalized understanding and had no concerns.  Called and verified with pharmacy pt copay should only be $4. Pharmacy verified that is what they have on file. NFN

## 2024-04-13 ENCOUNTER — Encounter: Payer: Commercial Managed Care - PPO | Admitting: Emergency Medicine

## 2024-04-20 ENCOUNTER — Encounter: Payer: Self-pay | Admitting: Emergency Medicine

## 2024-04-20 ENCOUNTER — Other Ambulatory Visit (HOSPITAL_COMMUNITY): Payer: Self-pay

## 2024-04-20 ENCOUNTER — Other Ambulatory Visit: Payer: Self-pay | Admitting: Emergency Medicine

## 2024-04-20 ENCOUNTER — Ambulatory Visit (INDEPENDENT_AMBULATORY_CARE_PROVIDER_SITE_OTHER): Admitting: Emergency Medicine

## 2024-04-20 ENCOUNTER — Telehealth: Payer: Self-pay

## 2024-04-20 ENCOUNTER — Telehealth: Payer: Self-pay | Admitting: Pharmacy Technician

## 2024-04-20 VITALS — BP 122/78 | HR 60 | Temp 97.6°F | Ht 65.0 in | Wt 273.5 lb

## 2024-04-20 DIAGNOSIS — K219 Gastro-esophageal reflux disease without esophagitis: Secondary | ICD-10-CM | POA: Diagnosis not present

## 2024-04-20 DIAGNOSIS — Z7984 Long term (current) use of oral hypoglycemic drugs: Secondary | ICD-10-CM

## 2024-04-20 DIAGNOSIS — E1169 Type 2 diabetes mellitus with other specified complication: Secondary | ICD-10-CM | POA: Insufficient documentation

## 2024-04-20 DIAGNOSIS — I152 Hypertension secondary to endocrine disorders: Secondary | ICD-10-CM | POA: Diagnosis not present

## 2024-04-20 DIAGNOSIS — E1159 Type 2 diabetes mellitus with other circulatory complications: Secondary | ICD-10-CM

## 2024-04-20 DIAGNOSIS — F411 Generalized anxiety disorder: Secondary | ICD-10-CM | POA: Insufficient documentation

## 2024-04-20 LAB — COMPREHENSIVE METABOLIC PANEL WITH GFR
ALT: 22 U/L (ref 0–35)
AST: 15 U/L (ref 0–37)
Albumin: 4.1 g/dL (ref 3.5–5.2)
Alkaline Phosphatase: 66 U/L (ref 39–117)
BUN: 18 mg/dL (ref 6–23)
CO2: 28 meq/L (ref 19–32)
Calcium: 9.4 mg/dL (ref 8.4–10.5)
Chloride: 104 meq/L (ref 96–112)
Creatinine, Ser: 1.04 mg/dL (ref 0.40–1.20)
GFR: 60.76 mL/min (ref >60.00)
Glucose, Bld: 130 mg/dL — ABNORMAL HIGH (ref 70–99)
Potassium: 3.9 meq/L (ref 3.5–5.1)
Sodium: 141 meq/L (ref 135–145)
Total Bilirubin: 0.5 mg/dL (ref 0.2–1.2)
Total Protein: 7 g/dL (ref 6.0–8.3)

## 2024-04-20 LAB — CBC WITH DIFFERENTIAL/PLATELET
Basophils Absolute: 0 10*3/uL (ref 0.0–0.1)
Basophils Relative: 0.5 % (ref 0.0–3.0)
Eosinophils Absolute: 0.1 10*3/uL (ref 0.0–0.7)
Eosinophils Relative: 1.3 % (ref 0.0–5.0)
HCT: 40.6 % (ref 36.0–46.0)
Hemoglobin: 13.4 g/dL (ref 12.0–15.0)
Lymphocytes Relative: 35.7 % (ref 12.0–46.0)
Lymphs Abs: 3.3 10*3/uL (ref 0.7–4.0)
MCHC: 33 g/dL (ref 30.0–36.0)
MCV: 88.8 fl (ref 78.0–100.0)
Monocytes Absolute: 0.6 10*3/uL (ref 0.1–1.0)
Monocytes Relative: 6.6 % (ref 3.0–12.0)
Neutro Abs: 5.2 10*3/uL (ref 1.4–7.7)
Neutrophils Relative %: 55.9 % (ref 43.0–77.0)
Platelets: 396 10*3/uL (ref 150.0–400.0)
RBC: 4.57 Mil/uL (ref 3.87–5.11)
RDW: 12.9 % (ref 11.5–15.5)
WBC: 9.3 10*3/uL (ref 4.0–10.5)

## 2024-04-20 LAB — LIPID PANEL
Cholesterol: 213 mg/dL — ABNORMAL HIGH (ref 0–200)
HDL: 37.1 mg/dL — ABNORMAL LOW (ref >39.00)
LDL Cholesterol: 144 mg/dL — ABNORMAL HIGH (ref 0–99)
NonHDL: 175.95
Total CHOL/HDL Ratio: 6
Triglycerides: 162 mg/dL — ABNORMAL HIGH (ref 0.0–149.0)
VLDL: 32.4 mg/dL (ref 0.0–40.0)

## 2024-04-20 LAB — MICROALBUMIN / CREATININE URINE RATIO
Creatinine,U: 59 mg/dL
Microalb Creat Ratio: UNDETERMINED mg/g (ref 0.0–30.0)
Microalb, Ur: <0.7 mg/dL

## 2024-04-20 LAB — HEMOGLOBIN A1C: Hgb A1c MFr Bld: 6.3 % (ref 4.6–6.5)

## 2024-04-20 LAB — VITAMIN B12: Vitamin B-12: >1537 pg/mL — ABNORMAL HIGH (ref 211–911)

## 2024-04-20 MED ORDER — EZETIMIBE 10 MG PO TABS: 10.0000 mg | ORAL_TABLET | Freq: Every day | ORAL | 3 refills | Status: DC

## 2024-04-20 MED ORDER — DEXCOM G6 SENSOR MISC: 6 refills | Status: DC

## 2024-04-20 MED ORDER — DAPAGLIFLOZIN PROPANEDIOL 10 MG PO TABS: 10.0000 mg | ORAL_TABLET | Freq: Every day | ORAL | 3 refills | Status: DC

## 2024-04-20 MED ORDER — TIRZEPATIDE 2.5 MG/0.5ML ~~LOC~~ SOAJ: 2.5000 mg | SUBCUTANEOUS | 3 refills | Status: DC

## 2024-04-20 NOTE — Telephone Encounter (Signed)
 Pharmacy Patient Advocate Encounter  Received notification from [employer] that Prior Authorization for MOUNJARO 0.5MG /0.5ML has been APPROVED from 04/20/2024 to 04/20/2025. Ran test claim, Copay is $4.00. This test claim was processed through [facility]- copay amounts may vary at other pharmacies due to pharmacy/plan contracts, or as the patient moves through the different stages of their insurance plan.   PA #/Case ID/Reference #: [id number]

## 2024-04-20 NOTE — Telephone Encounter (Signed)
 Pharmacy Patient Advocate Encounter   Received notification from Pt Calls Messages that prior authorization for DEXCOM G6 SENSOR is required/requested.   Insurance verification completed.   The patient is insured through [employer] .   Per test claim: The current 30 day co-pay is, $0.00.  No PA needed at this time. This test claim was processed through [facility]- copay amounts may vary at other pharmacies due to pharmacy/plan contracts, or as the patient moves through the different stages of their insurance plan.

## 2024-04-20 NOTE — Progress Notes (Signed)
 Amanda Mcintosh 53 y.o.   Chief Complaint  Patient presents with   Transitions Of Care    Wants to talk about being newly diagnosis with diabetes and also feeling fatigue most times     HISTORY OF PRESENT ILLNESS: This is a 53 y.o. female here to establish care with me. History of hypertension and diabetes. Also history of generalized anxiety disorder.  Sees a therapist on a regular basis  HPI   Prior to Admission medications   Medication Sig Start Date End Date Taking? Authorizing Provider  albuterol  (VENTOLIN  HFA) 108 (90 Base) MCG/ACT inhaler Inhale 2 puffs into the lungs every 6 (six) hours as needed for wheezing or shortness of breath. 02/12/24  Yes Hunsucker, Archer Kobs, MD  AMBULATORY NON FORMULARY MEDICATION Compression knee sleeve for pre-patellar bursitis Dispense 1 pre-patellar bursitis Use as needed 08/01/22  Yes Syliva Even, MD  Blood Glucose Monitoring Suppl DEVI 1 each by Does not apply route in the morning, at noon, and at bedtime. May substitute to any manufacturer covered by patient's insurance. 02/19/24  Yes Adra Alanis, FNP  Blood Pressure Monitoring (OMRON 3 SERIES BP MONITOR) DEVI Use to check blood pressure daily. 03/09/24  Yes Adra Alanis, FNP  ciclopirox  (PENLAC ) 8 % solution Apply topically at bedtime. Apply over nail and surrounding skin. Apply daily over previous coat. After seven (7) days, may remove with alcohol and continue cycle. 10/16/23  Yes Velma Ghazi, DPM  diclofenac  Sodium (VOLTAREN ) 1 % GEL Apply 4 g topically 4 (four) times daily. To affected joint. 08/01/22  Yes Syliva Even, MD  fluticasone  (FLONASE ) 50 MCG/ACT nasal spray Place 1 spray into both nostrils in the morning and at bedtime. Patient taking differently: Place 1 spray into both nostrils as needed. 04/03/22  Yes Kommor, Madison, MD  hydrOXYzine  (VISTARIL ) 25 MG capsule TAKE 1 CAPSULE (25 MG TOTAL) BY MOUTH EVERY 8 (EIGHT) HOURS AS NEEDED FOR ANXIETY. 10/21/23  Yes  Adra Alanis, FNP  metFORMIN  (GLUCOPHAGE -XR) 500 MG 24 hr tablet Take 1 tablet (500 mg total) by mouth daily with breakfast. 02/14/24  Yes Adra Alanis, FNP  pantoprazole  (PROTONIX ) 40 MG tablet Take 40 mg by mouth daily as needed. 07/17/23  Yes [provider]  valsartan -hydrochlorothiazide  (DIOVAN -HCT) 160-25 MG tablet Take 1 tablet by mouth daily. 02/14/24  Yes Adra Alanis, FNP  Vitamin D , Ergocalciferol , (DRISDOL ) 1.25 MG (50000 UNIT) CAPS capsule Take 1 capsule (50,000 Units total) by mouth every 7 (seven) days for 12 doses. 02/27/24 05/15/24 Yes Adra Alanis, FNP    Allergies  Allergen Reactions   Crestor  [Rosuvastatin  Calcium ] Other (See Comments)    Severe myalgia   Gadolinium Derivatives Other (See Comments)    PT STARTED SHAKING AND FELT VERY COLD INTERNALLY BUT FACE WAS VERY FLUSHED. THIS WAS A DELAYED REACTION (ABOUT 1 HOUR AFTER INJECTION). SHE TOOK BENADRYL  AND HYDRATED AND SYMPTOMS SUBSIDED    Patient Active Problem List   Diagnosis Date Noted   Dermatofibroma of left lower leg 06/27/2023   Pre-diabetes 01/16/2023   Other hyperlipidemia 10/17/2022   Vitamin D  deficiency 08/28/2022   Prediabetes 08/28/2022   Insulin  resistance 08/28/2022   Depression 08/28/2022   Essential hypertension 07/27/2022   Family history of CHF (congestive heart failure) 07/27/2022   DOE (dyspnea on exertion) 07/27/2022   Hyperlipidemia due to dietary fat intake 07/27/2022   Metabolic syndrome 07/27/2022   OSA on CPAP 06/24/2019   Chronic seasonal allergic rhinitis 01/25/2017  Chronic sinusitis 01/25/2017   Amenorrhea 06/28/2015   GERD (gastroesophageal reflux disease) 04/21/2015   Iron deficiency anemia 12/02/2013   Migraine headache without aura 03/11/2013   Family history of early CAD 09/08/2012    Past Medical History:  Diagnosis Date   Diabetes mellitus without complication (HCC)    Fibroids    Heartburn    History of headache     Hyperlipidemia    Hypertension    Obesity    Thyroid  condition    Vitamin D  deficiency     Past Surgical History:  Procedure Laterality Date   CHOLECYSTECTOMY  12/31/2004   Pulmonary function tests  09/14/2022   Normal lung volumes and DLCO.  Normal spirometry.   ROBOT ASSISTED MYOMECTOMY  12/31/2009   Stress Echocardiogram  08/2012   Exercise to max HR 164 bpm - 93% MPHR 177 bpm.  Normal BP and HR response.  No electrical or echocardiographic evidence of exercise-induced ischemia.  Normal EF, baseline > 55% with no RWMA.  Peak stress LVEF increased to 65-70% with no RWMA.  LOW RISK.   TONSILLECTOMY     TRANSTHORACIC ECHOCARDIOGRAM  08/15/2022   NORMAL.  EF 55 to 60%.  No RWMA.  Normal diastolic parameters.  Unable to assess PAP and otherwise normal RV.  Normal AOV and MV.  Mildly elevated RAP.   WISDOM TOOTH EXTRACTION      Social History   Socioeconomic History   Marital status: Legally Separated    Spouse name: Darryl   Number of children: 1   Years of education: Not on file   Highest education level: Some college, no degree  Occupational History   Occupation: Customer service manager: [employer]  Tobacco Use   Smoking status: Former    Current packs/day: 0.00    Types: Cigarettes    Quit date: 02/27/2021    Years since quitting: 3.1    Passive exposure: Past   Smokeless tobacco: Never   Tobacco comments:    Quit in Feb 27 2022. Tay 07/26/22  Substance and Sexual Activity   Alcohol use: No   Drug use: No   Sexual activity: Not on file  Other Topics Concern   Not on file  Social History Narrative   Not on file   Social Drivers of Health   Financial Resource Strain: High Risk (07/08/2023)   Overall Financial Resource Strain (CARDIA)    Difficulty of Paying Living Expenses: Very hard  Food Insecurity: Food Insecurity Present (07/08/2023)   Hunger Vital Sign    Worried About Running Out of Food in the Last Year: Sometimes true    Ran Out of Food in the  Last Year: Never true  Transportation Needs: No Transportation Needs (07/08/2023)   PRAPARE - Administrator, Civil Service (Medical): No    Lack of Transportation (Non-Medical): No  Physical Activity: Unknown (07/08/2023)   Exercise Vital Sign    Days of Exercise per Week: 0 days    Minutes of Exercise per Session: Not on file  Stress: Stress Concern Present (07/08/2023)   [employer] of Occupational Health - Occupational Stress Questionnaire    Feeling of Stress : Very much  Social Connections: Unknown (07/08/2023)   Social Connection and Isolation Panel [NHANES]    Frequency of Communication with Friends and Family: Twice a week    Frequency of Social Gatherings with Friends and Family: Never    Attends Religious Services: More than 4 times per year  Active Member of Clubs or Organizations: No    Attends Banker Meetings: Not on file    Marital Status: Not on file  Recent Concern: Social Connections - Somewhat Isolated (04/30/2023)   Received from [facility]   Social Network    How would you rate your social network (family, work, friends)?: Restricted participation with some degree of social isolation  Intimate Partner Violence: Not At Risk (04/30/2023)   Received from [employer], [facility]   HITS    Over the last 12 months how often did your partner physically hurt you?: Never    Over the last 12 months how often did your partner insult you or talk down to you?: Sometimes    Over the last 12 months how often did your partner threaten you with physical harm?: Never    Over the last 12 months how often did your partner scream or curse at you?: Sometimes    Family History  Problem Relation Age of Onset   Diabetes Mother    Hypertension Mother    Heart failure Mother    Stroke Mother    High Cholesterol Mother    Depression Mother    Anxiety disorder Mother    Sleep apnea Mother    Obesity Mother    Obstructive Sleep Apnea  Mother    Coronary artery disease Mother 54       s/p PCI   Diabetes Mellitus II Mother    Heart attack Mother 36   COPD Father        smoker   High blood pressure Father    High Cholesterol Father    Breast cancer Sister    Skin cancer Sister    HIV/AIDS Brother    Heart disease Brother 73       "enlarged heart" = presented with Acute Resp Failure   Hyperlipidemia Other    Migraines Neg Hx      Review of Systems  Constitutional: Negative.  Negative for chills and fever.  HENT: Negative.  Negative for congestion and sore throat.   Respiratory: Negative.  Negative for cough and shortness of breath.   Cardiovascular: Negative.  Negative for chest pain and palpitations.  Gastrointestinal:  Negative for abdominal pain, diarrhea, nausea and vomiting.  Genitourinary: Negative.  Negative for dysuria and hematuria.  Skin: Negative.  Negative for rash.  Neurological: Negative.  Negative for dizziness and headaches.  All other systems reviewed and are negative.   Vitals:   04/20/24 0824  BP: 122/78  Pulse: 60  Temp: 97.6 F (36.4 C)    Physical Exam Vitals reviewed.  Constitutional:      Appearance: Normal appearance. She is obese.  HENT:     Head: Normocephalic.  Eyes:     Extraocular Movements: Extraocular movements intact.     Pupils: Pupils are equal, round, and reactive to light.  Cardiovascular:     Rate and Rhythm: Normal rate and regular rhythm.     Pulses: Normal pulses.     Heart sounds: Normal heart sounds.  Pulmonary:     Effort: Pulmonary effort is normal.     Breath sounds: Normal breath sounds.  Musculoskeletal:     Cervical back: No tenderness.     Right lower leg: No edema.     Left lower leg: No edema.  Lymphadenopathy:     Cervical: No cervical adenopathy.  Neurological:     General: No focal deficit present.     Mental  Status: She is alert and oriented to person, place, and time.  Psychiatric:        Mood and Affect: Mood normal.         Behavior: Behavior normal.      ASSESSMENT & PLAN: A total of 48 minutes was spent with the patient and counseling/coordination of care regarding preparing for this visit, review of available medical records, establishing care with me, review of multiple chronic medical conditions and their management, review of all medications and changes made, cardiovascular risks associated with hypertension and diabetes, review of most recent blood work results, education and nutrition, review of health maintenance items, prognosis, documentation, and need for follow-up in 3 months.  Problem List Items Addressed This Visit       Cardiovascular and Mediastinum   Hypertension associated with diabetes (HCC) - Primary   BP Readings from Last 3 Encounters:  04/20/24 122/78  02/13/24 114/66  02/12/24 112/64  Well-controlled hypertension Continue valsartan  HCTZ 160-25 mg daily Lab Results  Component Value Date   HGBA1C 7.6 (H) 02/13/2024  Recently started on metformin .  Taking 500 mg once a day Cardiovascular risks associated with diabetes and hypertension discussed Benefits of exercise discussed Diet and nutrition discussed Recommend to start GLP-1 agonist weekly as per insurance's formulary Recommend to increase metformin  to 500 mg twice a day Blood work and urine done today Follow-up in 3 months        Relevant Medications   Continuous Glucose Sensor (DEXCOM G6 SENSOR) MISC   tirzepatide (MOUNJARO) 2.5 MG/0.5ML Pen   Other Relevant Orders   Microalbumin / creatinine urine ratio   CBC with Differential/Platelet   Comprehensive metabolic panel with GFR   Hemoglobin A1c   Lipid panel   Vitamin B12     Digestive   GERD (gastroesophageal reflux disease)   Stable.  Only takes pantoprazole  as needed.        Other   Generalized anxiety disorder   Chronic stable condition.  Just started seeing therapist on a regular basis Does not want to start medication      Patient Instructions   Continue metformin  500 mg but take twice a day We will start Mounjaro 2.5 mg weekly.  Recommend to increase to 5 mg after 2 weeks if side effects tolerated Blood work today Follow-up in 3 months  Diabetes Mellitus and Nutrition, Adult When you have diabetes, or diabetes mellitus, it is very important to have healthy eating habits because your blood sugar (glucose) levels are greatly affected by what you eat and drink. Eating healthy foods in the right amounts, at about the same times every day, can help you: Manage your blood glucose. Lower your risk of heart disease. Improve your blood pressure. Reach or maintain a healthy weight. What can affect my meal plan? Every person with diabetes is different, and each person has different needs for a meal plan. Your health care provider may recommend that you work with a dietitian to make a meal plan that is best for you. Your meal plan may vary depending on factors such as: The calories you need. The medicines you take. Your weight. Your blood glucose, blood pressure, and cholesterol levels. Your activity level. Other health conditions you have, such as heart or kidney disease. How do carbohydrates affect me? Carbohydrates, also called carbs, affect your blood glucose level more than any other type of food. Eating carbs raises the amount of glucose in your blood. It is important to know how many carbs you  can safely have in each meal. This is different for every person. Your dietitian can help you calculate how many carbs you should have at each meal and for each snack. How does alcohol affect me? Alcohol can cause a decrease in blood glucose (hypoglycemia), especially if you use insulin  or take certain diabetes medicines by mouth. Hypoglycemia can be a life-threatening condition. Symptoms of hypoglycemia, such as sleepiness, dizziness, and confusion, are similar to symptoms of having too much alcohol. Do not drink alcohol if: Your health care  provider tells you not to drink. You are pregnant, may be pregnant, or are planning to become pregnant. If you drink alcohol: Limit how much you have to: 0-1 drink a day for women. 0-2 drinks a day for men. Know how much alcohol is in your drink. In the [place] one drink equals one 12 oz bottle of beer (355 mL), one 5 oz glass of wine (148 mL), or one 1 oz glass of hard liquor (44 mL). Keep yourself hydrated with water, diet soda, or unsweetened iced tea. Keep in mind that regular soda, juice, and other mixers may contain a lot of sugar and must be counted as carbs. What are tips for following this plan?  Reading food labels Start by checking the serving size on the Nutrition Facts label of packaged foods and drinks. The number of calories and the amount of carbs, fats, and other nutrients listed on the label are based on one serving of the item. Many items contain more than one serving per package. Check the total grams (g) of carbs in one serving. Check the number of grams of saturated fats and trans fats in one serving. Choose foods that have a low amount or none of these fats. Check the number of milligrams (mg) of salt (sodium) in one serving. Most people should limit total sodium intake to less than 2,300 mg per day. Always check the nutrition information of foods labeled as "low-fat" or "nonfat." These foods may be higher in added sugar or refined carbs and should be avoided. Talk to your dietitian to identify your daily goals for nutrients listed on the label. Shopping Avoid buying canned, pre-made, or processed foods. These foods tend to be high in fat, sodium, and added sugar. Shop around the outside edge of the grocery store. This is where you will most often find fresh fruits and vegetables, bulk grains, fresh meats, and fresh dairy products. Cooking Use low-heat cooking methods, such as baking, instead of high-heat cooking methods, such as deep frying. Cook using healthy oils, such  as olive, canola, or sunflower oil. Avoid cooking with butter, cream, or high-fat meats. Meal planning Eat meals and snacks regularly, preferably at the same times every day. Avoid going long periods of time without eating. Eat foods that are high in fiber, such as fresh fruits, vegetables, beans, and whole grains. Eat 4-6 oz (112-168 g) of lean protein each day, such as lean meat, chicken, fish, eggs, or tofu. One ounce (oz) (28 g) of lean protein is equal to: 1 oz (28 g) of meat, chicken, or fish. 1 egg.  cup (62 g) of tofu. Eat some foods each day that contain healthy fats, such as avocado, nuts, seeds, and fish. What foods should I eat? Fruits Berries. Apples. Oranges. Peaches. Apricots. Plums. Grapes. Mangoes. Papayas. Pomegranates. Kiwi. Cherries. Vegetables Leafy greens, including lettuce, spinach, kale, chard, collard greens, mustard greens, and cabbage. Beets. Cauliflower. Broccoli. Carrots. Green beans. Tomatoes. Peppers. Onions. Cucumbers. Brussels sprouts. Grains  Whole grains, such as whole-wheat or whole-grain bread, crackers, tortillas, cereal, and pasta. Unsweetened oatmeal. Quinoa. Brown or wild rice. Meats and other proteins Seafood. Poultry without skin. Lean cuts of poultry and beef. Tofu. Nuts. Seeds. Dairy Low-fat or fat-free dairy products such as milk, yogurt, and cheese. The items listed above may not be a complete list of foods and beverages you can eat and drink. Contact a dietitian for more information. What foods should I avoid? Fruits Fruits canned with syrup. Vegetables Canned vegetables. Frozen vegetables with butter or cream sauce. Grains Refined white flour and flour products such as bread, pasta, snack foods, and cereals. Avoid all processed foods. Meats and other proteins Fatty cuts of meat. Poultry with skin. Breaded or fried meats. Processed meat. Avoid saturated fats. Dairy Full-fat yogurt, cheese, or milk. Beverages Sweetened drinks, such as  soda or iced tea. The items listed above may not be a complete list of foods and beverages you should avoid. Contact a dietitian for more information. Questions to ask a health care provider Do I need to meet with a certified diabetes care and education specialist? Do I need to meet with a dietitian? What number can I call if I have questions? When are the best times to check my blood glucose? Where to find more information: American Diabetes Association: diabetes.org Academy of Nutrition and Dietetics: eatright.[employer] of Diabetes and Digestive and Kidney Diseases: StageSync.si Association of Diabetes Care & Education Specialists: diabeteseducator.org Summary It is important to have healthy eating habits because your blood sugar (glucose) levels are greatly affected by what you eat and drink. It is important to use alcohol carefully. A healthy meal plan will help you manage your blood glucose and lower your risk of heart disease. Your health care provider may recommend that you work with a dietitian to make a meal plan that is best for you. This information is not intended to replace advice given to you by your health care provider. Make sure you discuss any questions you have with your health care provider. Document Revised: 07/19/2020 Document Reviewed: 07/20/2020 Elsevier Patient Education  2024 Elsevier Inc.    Maryagnes Small, MD [facility]

## 2024-04-20 NOTE — Assessment & Plan Note (Signed)
 Stable.  Only takes pantoprazole  as needed.

## 2024-04-20 NOTE — Assessment & Plan Note (Signed)
 Chronic stable condition.  Just started seeing therapist on a regular basis Does not want to start medication

## 2024-04-20 NOTE — Assessment & Plan Note (Signed)
 BP Readings from Last 3 Encounters:  04/20/24 122/78  02/13/24 114/66  02/12/24 112/64  Well-controlled hypertension Continue valsartan  HCTZ 160-25 mg daily Lab Results  Component Value Date   HGBA1C 7.6 (H) 02/13/2024  Recently started on metformin .  Taking 500 mg once a day Cardiovascular risks associated with diabetes and hypertension discussed Benefits of exercise discussed Diet and nutrition discussed Recommend to start GLP-1 agonist weekly as per insurance's formulary Recommend to increase metformin  to 500 mg twice a day Blood work and urine done today Follow-up in 3 months

## 2024-04-20 NOTE — Telephone Encounter (Signed)
 Good morning Mounjaro was approved 04/20/24 which is in telephone encounter 04/20/24 and Dexcom went through for 30 day supply for $0.00, details for Dexcom G6 sensors is in telephone encounter 04/20/24

## 2024-04-20 NOTE — Telephone Encounter (Signed)
 Pharmacy Patient Advocate Encounter   Received notification from CoverMyMeds that prior authorization for Mounjaro 2.5MG /0.5ML auto-injectors is required/requested.   Insurance verification completed.   The patient is insured through [employer] .   Per test claim: PA required; PA submitted to above mentioned insurance via CoverMyMeds Key/confirmation #/EOC [id number] Status is pending

## 2024-04-20 NOTE — Telephone Encounter (Signed)
 Please a PA for Dexcom G6 and Mounjaro

## 2024-04-20 NOTE — Patient Instructions (Signed)
 Continue metformin  500 mg but take twice a day We will start Mounjaro  2.5 mg weekly.  Recommend to increase to 5 mg after 2 weeks if side effects tolerated Blood work today Follow-up in 3 months  Diabetes Mellitus and Nutrition, Adult When you have diabetes, or diabetes mellitus, it is very important to have healthy eating habits because your blood sugar (glucose) levels are greatly affected by what you eat and drink. Eating healthy foods in the right amounts, at about the same times every day, can help you: Manage your blood glucose. Lower your risk of heart disease. Improve your blood pressure. Reach or maintain a healthy weight. What can affect my meal plan? Every person with diabetes is different, and each person has different needs for a meal plan. Your health care provider may recommend that you work with a dietitian to make a meal plan that is best for you. Your meal plan may vary depending on factors such as: The calories you need. The medicines you take. Your weight. Your blood glucose, blood pressure, and cholesterol levels. Your activity level. Other health conditions you have, such as heart or kidney disease. How do carbohydrates affect me? Carbohydrates, also called carbs, affect your blood glucose level more than any other type of food. Eating carbs raises the amount of glucose in your blood. It is important to know how many carbs you can safely have in each meal. This is different for every person. Your dietitian can help you calculate how many carbs you should have at each meal and for each snack. How does alcohol affect me? Alcohol can cause a decrease in blood glucose (hypoglycemia), especially if you use insulin  or take certain diabetes medicines by mouth. Hypoglycemia can be a life-threatening condition. Symptoms of hypoglycemia, such as sleepiness, dizziness, and confusion, are similar to symptoms of having too much alcohol. Do not drink alcohol if: Your health care  provider tells you not to drink. You are pregnant, may be pregnant, or are planning to become pregnant. If you drink alcohol: Limit how much you have to: 0-1 drink a day for women. 0-2 drinks a day for men. Know how much alcohol is in your drink. In the [place] one drink equals one 12 oz bottle of beer (355 mL), one 5 oz glass of wine (148 mL), or one 1 oz glass of hard liquor (44 mL). Keep yourself hydrated with water, diet soda, or unsweetened iced tea. Keep in mind that regular soda, juice, and other mixers may contain a lot of sugar and must be counted as carbs. What are tips for following this plan?  Reading food labels Start by checking the serving size on the Nutrition Facts label of packaged foods and drinks. The number of calories and the amount of carbs, fats, and other nutrients listed on the label are based on one serving of the item. Many items contain more than one serving per package. Check the total grams (g) of carbs in one serving. Check the number of grams of saturated fats and trans fats in one serving. Choose foods that have a low amount or none of these fats. Check the number of milligrams (mg) of salt (sodium) in one serving. Most people should limit total sodium intake to less than 2,300 mg per day. Always check the nutrition information of foods labeled as "low-fat" or "nonfat." These foods may be higher in added sugar or refined carbs and should be avoided. Talk to your dietitian to identify your daily goals for  nutrients listed on the label. Shopping Avoid buying canned, pre-made, or processed foods. These foods tend to be high in fat, sodium, and added sugar. Shop around the outside edge of the grocery store. This is where you will most often find fresh fruits and vegetables, bulk grains, fresh meats, and fresh dairy products. Cooking Use low-heat cooking methods, such as baking, instead of high-heat cooking methods, such as deep frying. Cook using healthy oils, such  as olive, canola, or sunflower oil. Avoid cooking with butter, cream, or high-fat meats. Meal planning Eat meals and snacks regularly, preferably at the same times every day. Avoid going long periods of time without eating. Eat foods that are high in fiber, such as fresh fruits, vegetables, beans, and whole grains. Eat 4-6 oz (112-168 g) of lean protein each day, such as lean meat, chicken, fish, eggs, or tofu. One ounce (oz) (28 g) of lean protein is equal to: 1 oz (28 g) of meat, chicken, or fish. 1 egg.  cup (62 g) of tofu. Eat some foods each day that contain healthy fats, such as avocado, nuts, seeds, and fish. What foods should I eat? Fruits Berries. Apples. Oranges. Peaches. Apricots. Plums. Grapes. Mangoes. Papayas. Pomegranates. Kiwi. Cherries. Vegetables Leafy greens, including lettuce, spinach, kale, chard, collard greens, mustard greens, and cabbage. Beets. Cauliflower. Broccoli. Carrots. Green beans. Tomatoes. Peppers. Onions. Cucumbers. Brussels sprouts. Grains Whole grains, such as whole-wheat or whole-grain bread, crackers, tortillas, cereal, and pasta. Unsweetened oatmeal. Quinoa. Brown or wild rice. Meats and other proteins Seafood. Poultry without skin. Lean cuts of poultry and beef. Tofu. Nuts. Seeds. Dairy Low-fat or fat-free dairy products such as milk, yogurt, and cheese. The items listed above may not be a complete list of foods and beverages you can eat and drink. Contact a dietitian for more information. What foods should I avoid? Fruits Fruits canned with syrup. Vegetables Canned vegetables. Frozen vegetables with butter or cream sauce. Grains Refined white flour and flour products such as bread, pasta, snack foods, and cereals. Avoid all processed foods. Meats and other proteins Fatty cuts of meat. Poultry with skin. Breaded or fried meats. Processed meat. Avoid saturated fats. Dairy Full-fat yogurt, cheese, or milk. Beverages Sweetened drinks, such as  soda or iced tea. The items listed above may not be a complete list of foods and beverages you should avoid. Contact a dietitian for more information. Questions to ask a health care provider Do I need to meet with a certified diabetes care and education specialist? Do I need to meet with a dietitian? What number can I call if I have questions? When are the best times to check my blood glucose? Where to find more information: American Diabetes Association: diabetes.org Academy of Nutrition and Dietetics: eatright.[employer] of Diabetes and Digestive and Kidney Diseases: StageSync.si Association of Diabetes Care & Education Specialists: diabeteseducator.org Summary It is important to have healthy eating habits because your blood sugar (glucose) levels are greatly affected by what you eat and drink. It is important to use alcohol carefully. A healthy meal plan will help you manage your blood glucose and lower your risk of heart disease. Your health care provider may recommend that you work with a dietitian to make a meal plan that is best for you. This information is not intended to replace advice given to you by your health care provider. Make sure you discuss any questions you have with your health care provider. Document Revised: 07/19/2020 Document Reviewed: 07/20/2020 Elsevier Patient Education  2024  [employer].

## 2024-04-22 ENCOUNTER — Encounter: Payer: Self-pay | Admitting: Radiology

## 2024-04-23 ENCOUNTER — Telehealth: Payer: Self-pay | Admitting: Pharmacist

## 2024-04-23 ENCOUNTER — Other Ambulatory Visit: Payer: Self-pay | Admitting: Pharmacist

## 2024-04-23 NOTE — Telephone Encounter (Signed)
 Attempt was made to contact patient by phone today for follow up by Clinical Pharmacist regarding follow up type 2 DM.  Unable to reach patient. LM on VM with my contact number [phone]. Also mention that she can contact Clinical Pharmacist Practitioner at Dr Hilaria Loveless office [facility]. Forwarding message to [employer].

## 2024-04-23 NOTE — Telephone Encounter (Signed)
 Pt made aware of this in different encounter

## 2024-04-29 ENCOUNTER — Telehealth: Payer: Self-pay | Admitting: Emergency Medicine

## 2024-04-29 ENCOUNTER — Other Ambulatory Visit: Payer: Self-pay | Admitting: Family

## 2024-04-29 DIAGNOSIS — E119 Type 2 diabetes mellitus without complications: Secondary | ICD-10-CM

## 2024-04-29 NOTE — Telephone Encounter (Signed)
 Copied from CRM [id number]. Topic: Clinical - Prescription Issue >> Apr 29, 2024 10:29 AM Chuck Crater wrote: Reason for CRM: Patient states that her Valsartan  and Metformin  will need providers approval for 90 days supply in order for pharmacy to fill. She needs it approved before May 13.

## 2024-04-30 NOTE — Telephone Encounter (Signed)
 Okay to approve and refill these medications.  Thanks.

## 2024-05-01 ENCOUNTER — Other Ambulatory Visit: Payer: Self-pay | Admitting: Radiology

## 2024-05-01 MED ORDER — METFORMIN HCL ER 500 MG PO TB24: 500.0000 mg | ORAL_TABLET | Freq: Every day | ORAL | 0 refills | Status: DC

## 2024-05-01 MED ORDER — VALSARTAN-HYDROCHLOROTHIAZIDE 160-25 MG PO TABS: 1.0000 | ORAL_TABLET | Freq: Every day | ORAL | 0 refills | Status: DC

## 2024-05-01 NOTE — Telephone Encounter (Signed)
 Rx refill for 90 days has been sent

## 2024-05-01 NOTE — Progress Notes (Signed)
 Patient ID: Amanda Mcintosh, female   DOB: 01/18/1971, 53 y.o.   MRN: [id number]  Reviewed chart and noted pt is well controlled and had Pas approved for Mounjaro and Dexcom - no action needed at this time.  Rainelle Bur, PharmD, BCPS, CPP Clinical Pharmacist Practitioner [facility] [phone]

## 2024-05-07 ENCOUNTER — Telehealth: Payer: Self-pay | Admitting: Emergency Medicine

## 2024-05-07 NOTE — Telephone Encounter (Signed)
 Copied from CRM [id number]. Topic: Clinical - Prescription Issue >> May 07, 2024  5:18 PM Chuck Crater wrote: Reason for CRM: Patient stated that Pharmacy has the Dexcom G6 but stated that the transmittor was not sent. Patient says she wants the one for the Lifestyle Dexcom and not the one for the stomach.

## 2024-05-08 ENCOUNTER — Other Ambulatory Visit: Payer: Self-pay | Admitting: Radiology

## 2024-05-08 MED ORDER — DEXCOM G7 RECEIVER DEVI: 0 refills | Status: DC

## 2024-05-08 MED ORDER — DEXCOM G7 SENSOR MISC: 8 refills | Status: DC

## 2024-05-08 NOTE — Telephone Encounter (Signed)
 CMA Amanda Mcintosh spoke with patient and informed her a new Dexcom G7 is being sent, a receiver and sensor. Previous order was incorrect per patient.

## 2024-05-08 NOTE — Telephone Encounter (Signed)
 This has been taking acknowledge and corrected. Patient has been informed

## 2024-05-18 ENCOUNTER — Other Ambulatory Visit: Payer: Self-pay | Admitting: Emergency Medicine

## 2024-05-18 DIAGNOSIS — E1159 Type 2 diabetes mellitus with other circulatory complications: Secondary | ICD-10-CM

## 2024-05-19 ENCOUNTER — Other Ambulatory Visit (HOSPITAL_COMMUNITY): Payer: Self-pay

## 2024-05-19 ENCOUNTER — Telehealth: Payer: Self-pay

## 2024-05-19 NOTE — Telephone Encounter (Signed)
*  Primary  Pharmacy Patient Advocate Encounter  Received notification from [facility] that Prior Authorization for Mounjaro 2.5mg  has been CANCELLED due to max fill of 2 per 180 days- if patient is able the dose must be increased and then documentation of patient being unable to tolerate higher doses if applicable.    PA #/Case ID/Reference #: [id number]

## 2024-05-19 NOTE — Telephone Encounter (Signed)
 Needs PA

## 2024-05-20 ENCOUNTER — Other Ambulatory Visit: Payer: Self-pay | Admitting: Radiology

## 2024-05-20 DIAGNOSIS — E1169 Type 2 diabetes mellitus with other specified complication: Secondary | ICD-10-CM

## 2024-05-20 MED ORDER — TIRZEPATIDE 5 MG/0.5ML ~~LOC~~ SOAJ: 5.0000 mg | SUBCUTANEOUS | 0 refills | Status: DC

## 2024-05-20 NOTE — Telephone Encounter (Signed)
 Copied from CRM [id number]. Topic: Clinical - Medication Prior Auth >> May 20, 2024 12:28 PM Adaysia C wrote: Reason for CRM: Patient has agreed to the submitting of a prior auth for a higher dosage of tirzepatide (MOUNJARO); please follow up with patient with updates on the prior auth #[id number]

## 2024-05-21 ENCOUNTER — Other Ambulatory Visit (HOSPITAL_COMMUNITY): Payer: Self-pay

## 2024-05-21 NOTE — Telephone Encounter (Signed)
 On 04/20/2024 I sent her an extensive MyChart message explaining the reason for the Farxiga .  Recommend to read it again.  This is  a diabetes medication that helps protect the kidneys.  Feeling of food just sitting in the stomach is a result of all GLP-1 agonists.

## 2024-05-22 ENCOUNTER — Other Ambulatory Visit: Payer: Self-pay | Admitting: Family

## 2024-06-10 ENCOUNTER — Telehealth: Payer: Self-pay | Admitting: Gastroenterology

## 2024-06-10 ENCOUNTER — Telehealth: Payer: Self-pay | Admitting: Internal Medicine

## 2024-06-10 NOTE — Telephone Encounter (Signed)
 Good morning Dr. Rosaline Coma,   DOD AM 6/11   Patient is requesting to be seen for colonoscopy. Patient last had a colonoscopy in 2021 with [facility]. Patient is requesting to transfer her care due to our office being a closer location. States she is no longer able to travel to further locations. Patient's previous records are in [employer] for you to review and advise on scheduling.  Thank you.

## 2024-06-10 NOTE — Telephone Encounter (Signed)
 Patient called and stated that she would like to transfer over her care over to us  due to her not being able to drive on the [place] [address] [place]. Patient was last seen 6 months ago with Dr. Algie Antis over with [facility]. Patient had a colonoscopy done 3 years ago. Please advise. FYI patient is going to have her colonoscopy report and pathology report faxed over.

## 2024-06-10 NOTE — Telephone Encounter (Signed)
 Okay to put in recall for colonoscopy to be done in 09/2025 for colon cancer screening.  Colonoscopy 10/10/20: Indication was history of colon polyps. Normal colon. Repeat colonoscopy recommended in 5 years.

## 2024-06-11 NOTE — Telephone Encounter (Signed)
 Called patient to inform of 2026 recall colonoscopy. Recall has been placed.

## 2024-06-18 ENCOUNTER — Ambulatory Visit: Admitting: Physician Assistant

## 2024-06-23 ENCOUNTER — Telehealth: Payer: Self-pay | Admitting: Family Medicine

## 2024-06-23 NOTE — Telephone Encounter (Signed)
 [name], do you need to see her for appt first? Does she need repeat sleep study first before ordering new machine?  Last seen 09/16/23. Next f/u 09/22/24. Per resmed airview, set up with cpap 04/07/2019.  Last HST completed 03/18/2019.

## 2024-06-23 NOTE — Telephone Encounter (Signed)
 Pt reported her CPAP as missing to her DME, they informed her that she has had hers long enough that she could just ask [name], NP to put an order in for a new one based on how long she has had the current.

## 2024-06-23 NOTE — Telephone Encounter (Signed)
 LVM for pt to call.

## 2024-06-25 NOTE — Telephone Encounter (Signed)
 Called pt at [phone]. She is currently in [place] for the next week. [employer] was able to locate CPAP and will hold until she gets home. I relayed info below. There are no sooner appt with [name] or Dr. Buck currently. I placed her on wait list in case sooner appt opens. She verbalized understanding.

## 2024-07-14 ENCOUNTER — Encounter: Payer: Self-pay | Admitting: Skilled Nursing Facility1

## 2024-07-14 ENCOUNTER — Encounter: Attending: Family | Admitting: Skilled Nursing Facility1

## 2024-07-14 DIAGNOSIS — E119 Type 2 diabetes mellitus without complications: Secondary | ICD-10-CM | POA: Insufficient documentation

## 2024-07-14 NOTE — Progress Notes (Unsigned)
 A1C 7.6  DM medications: Metformin  Monjouro   Pt states she does have a Dexcom now stating she does not have it on now because it came off at piliates states she had some numbers like 88 and 101.   Other Diagnosed: HTN Hyperlipidemia  Thyroid   OSA  Pt states she tried out a piliates class but it is cost prohibitive. Pt states she has a 7 day trial with a gym coming. Pt states she was walking on her treadmill at work 15 minutes 5 days a week.  Pt states she does typically skip meals nut has been working on reducing that.  Pt states she has been very tired feeling exhausted.  Pt staets she is exctied to try awater arobics.

## 2024-07-20 ENCOUNTER — Encounter: Payer: Self-pay | Admitting: Cardiology

## 2024-07-20 ENCOUNTER — Ambulatory Visit: Attending: Cardiology | Admitting: Cardiology

## 2024-07-20 VITALS — BP 130/68 | HR 80 | Ht 65.0 in | Wt 258.6 lb

## 2024-07-20 DIAGNOSIS — I10 Essential (primary) hypertension: Secondary | ICD-10-CM

## 2024-07-20 DIAGNOSIS — E1159 Type 2 diabetes mellitus with other circulatory complications: Secondary | ICD-10-CM | POA: Diagnosis not present

## 2024-07-20 DIAGNOSIS — E8881 Metabolic syndrome: Secondary | ICD-10-CM

## 2024-07-20 DIAGNOSIS — E1169 Type 2 diabetes mellitus with other specified complication: Secondary | ICD-10-CM | POA: Diagnosis not present

## 2024-07-20 DIAGNOSIS — E785 Hyperlipidemia, unspecified: Secondary | ICD-10-CM

## 2024-07-20 DIAGNOSIS — G4733 Obstructive sleep apnea (adult) (pediatric): Secondary | ICD-10-CM

## 2024-07-20 DIAGNOSIS — I152 Hypertension secondary to endocrine disorders: Secondary | ICD-10-CM

## 2024-07-20 DIAGNOSIS — Z8249 Family history of ischemic heart disease and other diseases of the circulatory system: Secondary | ICD-10-CM

## 2024-07-20 NOTE — Telephone Encounter (Signed)
 Good morning Dr. Federico,   Patient called and stated she is now wishing to be seen with Dr. Charlanne for her care. Please advise on transfer.   Thank you

## 2024-07-20 NOTE — Telephone Encounter (Signed)
 Good morning Dr. Charlanne,   Please review and advise on request below.   Thank you

## 2024-07-20 NOTE — Patient Instructions (Signed)
 Medication Instructions:  No changes  Dr Anner recommend you take Zetia  10 mg that your primary prescribed.  *If you need a refill on your cardiac medications before your next appointment, please call your pharmacy*   Lab Work: Not needed    Testing/Procedures: Not needed   Follow-Up: At [facility], you and your health needs are our priority.  As part of our continuing mission to provide you with exceptional heart care, we have created designated Provider Care Teams.  These Care Teams include your primary Cardiologist (physician) and Advanced Practice Providers (APPs -  Physician Assistants and Nurse Practitioners) who all work together to provide you with the care you need, when you need it.     Your next appointment:   12 month(s)  The format for your next appointment:   In Person  Provider:   Alm Anner, MD or Aline Door, PA-C, Rollo Louder, PA-C, Damien Braver, NP, or Katlyn West, NP      .   Other Instructions

## 2024-07-20 NOTE — Progress Notes (Unsigned)
 Cardiology Office Note:  .   Date:  07/24/2024  ID:  Amanda Mcintosh, DOB 1971-11-11, MRN [id number] PCP: Purcell Emil Schanz, MD  [facility] Providers Cardiologist:  Alm Clay, MD     Chief Complaint  Patient presents with   Follow-up    1 year follow-up.  Overall doing well    Patient Profile: .     Amanda Mcintosh is a morbidly obese 53 y.o. female with a PMH notable for metabolic syndrome (DM type II, HTN, HLD and obesity), OSA on CPAP, intermittent palpitations with family history of CHF who presents here for annual follow-up.   She was initially referred for evaluation at the request of Purcell Emil Schanz, *.  I last saw Amanda Mcintosh in October 2023 to discuss results of her echo and PFTs that were essentially normal..  She was noting intermittent episodes of chest pain often associate with anxiety and stress.  There was also some dyspnea associated with this.  Her palpitations seem to be well-controlled so we decided to just assess for ischemic CAD in order to Coronary CTA.     TRESEA HEINE was last seen in July 2024 by Barnie Hila, NP, doing well symptomatically.  BP controlled.  LDL was 187.  Discussed that she had myalgias with Crestor  suggested Zetia .  Plan was for 11-month follow-up of labs.  Subjective  Discussed the use of AI scribe software for clinical note transcription with the patient, who gave verbal consent to proceed.  History of Present Illness History of Present Illness Amanda Mcintosh is a 53 year old female with hypertension and hyperlipidemia who presents for follow-up of her cardiovascular health.  In 2023, she experienced chest pain and dyspnea, leading to a coronary CTA that showed a coronary calcium  score of zero. She was last seen in July 2024 for urgent care follow-up after experiencing stress and panic attacks following her niece's death from a heart attack. At that time, she reported improvement with no  exertional dyspnea or chest pain.  She associates shin splints and cramping with Crestor  use. She has been attempting lifestyle modifications to manage her cholesterol, with her LDL previously at 187 mg/dL. After losing 15 pounds and starting Mounjaro  and metformin  for diabetes management, her LDL is now 144 mg/dL. She has not been taking Zetia  due to concerns about leg cramps.  She recently had a sinus infection while traveling, for which she received antibiotics but did not complete the course. She continues to experience a persistent cough and chest tightness, which she attributes to mucus. She has an inhaler from a lung doctor and plans to see her primary care doctor for further management.  Her family history includes high cholesterol and diabetes, with her father having had high cholesterol and COPD, and her mother and grandmother having diabetes. She wants to manage her conditions to avoid similar health issues.     Objective   Medications HTN: - Valsartan -HCTZ 160-12.5 mg daily DM-2:- Mounjaro  5 mg weekly; - Metformin  XR 500 mg daily; Farxiga  10 mg daily HLD: -Zetia  10 mg daily (not taking) Pulmonary: Albuterol  as needed, Flonase  nasal twice daily Protonix  40 mg daily as needed Hydroxyzine  25 mg up to 3 times daily as needed anxiety  Social History - Tobacco: Denies smoking. Patient's father was a smoker and had copd. - Alcohol: Denies alcohol use. - Patient has experienced family stressors due to the deaths of family members. Family history includes high cholesterol and COPD in the father, who  was a smoker.   Studies Reviewed: SABRA   EKG Interpretation Date/Time:  Monday July 20 2024 16:05:26 EDT Ventricular Rate:  80 PR Interval:  152 QRS Duration:  88 QT Interval:  398 QTC Calculation: 459 R Axis:   -22  Text Interpretation: Normal sinus rhythm Normal ECG Borderline criteria for LVH by voltage When compared with ECG of 26-Jul-2023 09:11, Nonspecific T wave abnormality no  longer evident in Anterolateral leads Confirmed by Anner Lenis ([id number]) on 07/20/2024 4:12:39 PM    Results LABS (04/20/2024): TC 213, TG 160, HDL 37, LDL 144; A1c 6.3.  Hgb 13.4. Cr 1.04, K+ 3.9.  TSH 0.96.  RADIOLOGY Coronary CT Angiogram: Coronary Calcium  Score of Zero, no evidence of coronary artery disease; mild aortic atherosclerosis.  Normal cardiac chamber size.  (2023)  Diagnostics TTE 08/15/2022: NORMAL.  EF 55 to 60%.  No RWMA.  Normal diastolic parameters.  Unable to assess PAP and otherwise normal RV.  Normal AOV and MV.  Mildly elevated RAP. PFTs (09/14/2022) lung volumes normal.  DLCO normal.  Normal spirometry  Risk Assessment/Calculations:          Physical Exam:   VS:  BP 130/68 (BP Location: Left Arm, Patient Position: Sitting, Cuff Size: Normal)   Pulse 80   Ht 5' 5 (1.651 m)   Wt 258 lb 9.6 oz (117.3 kg)   LMP 02/28/2017   SpO2 96%   BMI 43.03 kg/m    Wt Readings from Last 3 Encounters:  07/21/24 256 lb (116.1 kg)  07/20/24 258 lb 9.6 oz (117.3 kg)  04/20/24 273 lb 8 oz (124.1 kg)    GEN: Well nourished, well groomed in no acute distress; morbidly obese but otherwise healthy-appearing NECK: No JVD; No carotid bruits CARDIAC:  RRR, Distant normal S1 and S2; no murmurs, rubs, gallops RESPIRATORY:  Clear to auscultation without rales, wheezing or rhonchi ; nonlabored, good air movement. ABDOMEN: Soft, non-tender, non-distended EXTREMITIES: Trivial ankle edema; No deformity      ASSESSMENT AND PLAN: .    Problem List Items Addressed This Visit       Cardiology Problems   Hypertension associated with diabetes (HCC) (Chronic)   Hypertension well-controlled. Weight loss and lifestyle modifications contributing positively. - Continue current dose of valsartan  and HCTZ (160-25 mg daily) - Encourage continued weight loss and lifestyle modifications.        Other   Dyslipidemia associated with type 2 diabetes mellitus (HCC) (Chronic)   Hyperlipidemia  with LDL at 144. Previous intolerance to Crestor . Zetia  was discontinued but reassured it should not cause muscle issues. Discussed importance of lowering LDL to less than 100, especially with diabetes, to prevent cardiovascular complications. - Restart Zetia . - Monitor cholesterol levels, aiming for LDL less than 100. - Continue lifestyle modifications including diet and exercise. - Reassess cholesterol levels in six months.  Discussed potential need to consider additional therapy which could simply be Nexletol versus potentially statin or other medication.  Type 2 diabetes with recent A1c of 6. Weight loss of 15 pounds noted. Discussed importance of blood sugar control to prevent complications, especially with hyperlipidemia. - Continue Mounjaro  and metformin . - Check labs every three months. - Encourage continued weight loss and lifestyle modifications.       Family history of early CAD (Chronic)   Coronary CTA results reviewed with calcium  score of 0 with no evidence of CAD.  Very reassuring. Placed her quite low risk, but with her other CRF's, would probably shoot for LDL least less  than 100, A1c less than 5.7 and blood pressure still controlled.      Metabolic syndrome (Chronic)   Currently now on Mounjaro  which hopefully will help with weight loss.  Glycemic control seems to be improved with A1c down to 6.3.  BP borderline controlled.  Hopefully with weight loss all these factors will reduce.      OSA on CPAP (Chronic)   Continue CPAP.      Other Visit Diagnoses       Essential hypertension    -  Primary   Relevant Orders   EKG 12-Lead (Completed)               Follow-Up: Return in about 1 year (around 07/20/2025).     Signed, Alm MICAEL Clay, MD, MS Alm Clay, M.D., M.S. Interventional Chartered certified accountant  Pager # [phone]

## 2024-07-21 ENCOUNTER — Encounter: Payer: Self-pay | Admitting: Emergency Medicine

## 2024-07-21 ENCOUNTER — Ambulatory Visit: Admitting: Emergency Medicine

## 2024-07-21 VITALS — BP 114/68 | HR 80 | Temp 98.4°F | Ht 65.0 in | Wt 256.0 lb

## 2024-07-21 DIAGNOSIS — E1169 Type 2 diabetes mellitus with other specified complication: Secondary | ICD-10-CM | POA: Diagnosis not present

## 2024-07-21 DIAGNOSIS — B9689 Other specified bacterial agents as the cause of diseases classified elsewhere: Secondary | ICD-10-CM

## 2024-07-21 DIAGNOSIS — I152 Hypertension secondary to endocrine disorders: Secondary | ICD-10-CM

## 2024-07-21 DIAGNOSIS — E1159 Type 2 diabetes mellitus with other circulatory complications: Secondary | ICD-10-CM

## 2024-07-21 DIAGNOSIS — J329 Chronic sinusitis, unspecified: Secondary | ICD-10-CM | POA: Diagnosis not present

## 2024-07-21 DIAGNOSIS — Z7984 Long term (current) use of oral hypoglycemic drugs: Secondary | ICD-10-CM

## 2024-07-21 DIAGNOSIS — R32 Unspecified urinary incontinence: Secondary | ICD-10-CM | POA: Diagnosis not present

## 2024-07-21 DIAGNOSIS — E785 Hyperlipidemia, unspecified: Secondary | ICD-10-CM | POA: Diagnosis not present

## 2024-07-21 LAB — POCT GLYCOSYLATED HEMOGLOBIN (HGB A1C): HbA1c POC (<> result, manual entry): 5.1 % (ref 4.0–5.6)

## 2024-07-21 MED ORDER — VALSARTAN-HYDROCHLOROTHIAZIDE 160-25 MG PO TABS: 1.0000 | ORAL_TABLET | Freq: Every day | ORAL | 0 refills | Status: DC

## 2024-07-21 MED ORDER — METFORMIN HCL ER 500 MG PO TB24: 500.0000 mg | ORAL_TABLET | Freq: Every day | ORAL | 0 refills | Status: DC

## 2024-07-21 MED ORDER — VITAMIN D (ERGOCALCIFEROL) 1.25 MG (50000 UNIT) PO CAPS: 50000.0000 [IU] | ORAL_CAPSULE | ORAL | 0 refills | Status: DC

## 2024-07-21 MED ORDER — AMOXICILLIN-POT CLAVULANATE 875-125 MG PO TABS: 1.0000 | ORAL_TABLET | Freq: Two times a day (BID) | ORAL | 0 refills | Status: AC

## 2024-07-21 MED ORDER — DEXCOM G7 RECEIVER DEVI: 0 refills | Status: AC

## 2024-07-21 MED ORDER — DEXCOM G7 SENSOR MISC: 8 refills | Status: AC

## 2024-07-21 NOTE — Assessment & Plan Note (Signed)
 Still symptomatic. Symptom management discussed Recommend to start Augmentin  875 mg twice a day for 7 days

## 2024-07-21 NOTE — Progress Notes (Signed)
 Amanda Mcintosh 53 y.o.   Chief Complaint  Patient presents with   Follow-up    Pt is still having a cough and wanted to know of she could have another round of antibiotic     HISTORY OF PRESENT ILLNESS: This is a 53 y.o. female here for 64-month follow-up of hypertension and diabetes Earlier this month she was seen at urgent care center due to bacterial sinusitis.  Was started on amoxicillin  twice a day for 7 days but still having persistent symptoms. Also complaining of occasional urinary incontinence No other complaints or medical concerns today. Lab Results  Component Value Date   HGBA1C 6.3 04/20/2024   BP Readings from Last 3 Encounters:  07/21/24 114/68  07/20/24 130/68  04/20/24 122/78   Wt Readings from Last 3 Encounters:  07/21/24 256 lb (116.1 kg)  07/20/24 258 lb 9.6 oz (117.3 kg)  04/20/24 273 lb 8 oz (124.1 kg)     HPI   Prior to Admission medications   Medication Sig Start Date End Date Taking? Authorizing Provider  ACCU-CHEK GUIDE TEST test strip USE AS DIRECTED IN THE MORNING, AT NOON, AND AT BEDTIME. 04/29/24  Yes Ercel Pepitone, Emil Schanz, MD  albuterol  (VENTOLIN  HFA) 108 (90 Base) MCG/ACT inhaler Inhale 2 puffs into the lungs every 6 (six) hours as needed for wheezing or shortness of breath. 02/12/24  Yes Hunsucker, Donnice SAUNDERS, MD  AMBULATORY NON FORMULARY MEDICATION Compression knee sleeve for pre-patellar bursitis Dispense 1 pre-patellar bursitis Use as needed 08/01/22  Yes Joane Artist RAMAN, MD  Blood Glucose Monitoring Suppl DEVI 1 each by Does not apply route in the morning, at noon, and at bedtime. May substitute to any manufacturer covered by patient's insurance. 02/19/24  Yes Jason Leita Repine, FNP  Blood Pressure Monitoring (OMRON 3 SERIES BP MONITOR) DEVI Use to check blood pressure daily. 03/09/24  Yes Jason Leita Repine, FNP  ciclopirox  (PENLAC ) 8 % solution Apply topically at bedtime. Apply over nail and surrounding skin. Apply daily over  previous coat. After seven (7) days, may remove with alcohol and continue cycle. 10/16/23  Yes Tobie Franky SQUIBB, DPM  Continuous Glucose Receiver (DEXCOM G7 RECEIVER) DEVI To check for blood sugar 05/08/24  Yes Dalal Livengood, Emil Schanz, MD  Continuous Glucose Sensor (DEXCOM G6 SENSOR) MISC Check Glucose once day 04/20/24  Yes Curtisha Bendix, Emil Schanz, MD  Continuous Glucose Sensor (DEXCOM G7 SENSOR) MISC Pt to use to check sugar level every 05/08/24  Yes Terrica Duecker, Emil Schanz, MD  dapagliflozin  propanediol (FARXIGA ) 10 MG TABS tablet Take 1 tablet (10 mg total) by mouth daily before breakfast. 04/20/24  Yes Jahquan Klugh, Emil Schanz, MD  diclofenac  Sodium (VOLTAREN ) 1 % GEL Apply 4 g topically 4 (four) times daily. To affected joint. 08/01/22  Yes Joane Artist RAMAN, MD  ezetimibe  (ZETIA ) 10 MG tablet Take 1 tablet (10 mg total) by mouth daily. 04/20/24  Yes Oluwatomiwa Kinyon, Emil Schanz, MD  fluticasone  (FLONASE ) 50 MCG/ACT nasal spray Place 1 spray into both nostrils in the morning and at bedtime. Patient taking differently: Place 1 spray into both nostrils as needed. 04/03/22  Yes Kommor, Madison, MD  hydrOXYzine  (VISTARIL ) 25 MG capsule TAKE 1 CAPSULE (25 MG TOTAL) BY MOUTH EVERY 8 (EIGHT) HOURS AS NEEDED FOR ANXIETY. 10/21/23  Yes Jason Leita Repine, FNP  metFORMIN  (GLUCOPHAGE -XR) 500 MG 24 hr tablet Take 1 tablet (500 mg total) by mouth daily with breakfast. 05/01/24  Yes Lamesha Tibbits, Emil Schanz, MD  pantoprazole  (PROTONIX ) 40 MG tablet Take 40 mg  by mouth daily as needed. 07/17/23  Yes [provider]  tirzepatide  (MOUNJARO ) 5 MG/0.5ML Pen Inject 5 mg into the skin once a week. 05/20/24  Yes Terriah Reggio, Emil Schanz, MD  valsartan -hydrochlorothiazide  (DIOVAN -HCT) 160-25 MG tablet Take 1 tablet by mouth daily. 05/01/24  Yes Laverda Stribling, Emil Schanz, MD  Vitamin D , Ergocalciferol , (DRISDOL ) 1.25 MG (50000 UNIT) CAPS capsule TAKE 1 CAPSULE (50,000 UNITS TOTAL) BY MOUTH EVERY 7 (SEVEN) DAYS FOR 12 DOSES. 05/22/24 08/08/24 Yes Damari Hiltz,  Emil Schanz, MD  tirzepatide  (MOUNJARO ) 2.5 MG/0.5ML Pen Inject 2.5 mg into the skin once a week. Patient not taking: Reported on 07/21/2024 04/20/24   Purcell Emil Schanz, MD    Allergies  Allergen Reactions   Crestor  [Rosuvastatin  Calcium ] Other (See Comments)    Severe myalgia   Gadolinium Derivatives Other (See Comments)    PT STARTED SHAKING AND FELT VERY COLD INTERNALLY BUT FACE WAS VERY FLUSHED. THIS WAS A DELAYED REACTION (ABOUT 1 HOUR AFTER INJECTION). SHE TOOK BENADRYL  AND HYDRATED AND SYMPTOMS SUBSIDED    Patient Active Problem List   Diagnosis Date Noted   Generalized anxiety disorder 04/20/2024   Dyslipidemia associated with type 2 diabetes mellitus (HCC) 04/20/2024   Dermatofibroma of left lower leg 06/27/2023   Other hyperlipidemia 10/17/2022   Vitamin D  deficiency 08/28/2022   Insulin  resistance 08/28/2022   Depression 08/28/2022   Hypertension associated with diabetes (HCC) 07/27/2022   Family history of CHF (congestive heart failure) 07/27/2022   DOE (dyspnea on exertion) 07/27/2022   Hyperlipidemia due to dietary fat intake 07/27/2022   Metabolic syndrome 07/27/2022   OSA on CPAP 06/24/2019   Chronic seasonal allergic rhinitis 01/25/2017   Chronic sinusitis 01/25/2017   Amenorrhea 06/28/2015   GERD (gastroesophageal reflux disease) 04/21/2015   Iron deficiency anemia 12/02/2013   Migraine headache without aura 03/11/2013   Family history of early CAD 09/08/2012    Past Medical History:  Diagnosis Date   Diabetes mellitus without complication (HCC)    Fibroids    Heartburn    History of headache    Hyperlipidemia    Hypertension    Obesity    Thyroid  condition    Vitamin D  deficiency     Past Surgical History:  Procedure Laterality Date   CHOLECYSTECTOMY  12/31/2004   Pulmonary function tests  09/14/2022   Normal lung volumes and DLCO.  Normal spirometry.   ROBOT ASSISTED MYOMECTOMY  12/31/2009   Stress Echocardiogram  08/2012   Exercise to  max HR 164 bpm - 93% MPHR 177 bpm.  Normal BP and HR response.  No electrical or echocardiographic evidence of exercise-induced ischemia.  Normal EF, baseline > 55% with no RWMA.  Peak stress LVEF increased to 65-70% with no RWMA.  LOW RISK.   TONSILLECTOMY     TRANSTHORACIC ECHOCARDIOGRAM  08/15/2022   NORMAL.  EF 55 to 60%.  No RWMA.  Normal diastolic parameters.  Unable to assess PAP and otherwise normal RV.  Normal AOV and MV.  Mildly elevated RAP.   WISDOM TOOTH EXTRACTION      Social History   Socioeconomic History   Marital status: Legally Separated    Spouse name: Darryl   Number of children: 1   Years of education: Not on file   Highest education level: Some college, no degree  Occupational History   Occupation: Customer service manager: [employer]  Tobacco Use   Smoking status: Former    Current packs/day: 0.00    Types: Cigarettes  Quit date: 02/27/2021    Years since quitting: 3.3    Passive exposure: Past   Smokeless tobacco: Never   Tobacco comments:    Quit in Feb 27 2022. Tay 07/26/22  Substance and Sexual Activity   Alcohol use: No   Drug use: No   Sexual activity: Not on file  Other Topics Concern   Not on file  Social History Narrative   Not on file   Social Drivers of Health   Financial Resource Strain: Low Risk  (07/19/2024)   Overall Financial Resource Strain (CARDIA)    Difficulty of Paying Living Expenses: Not very hard  Food Insecurity: Patient Declined (07/19/2024)   Hunger Vital Sign    Worried About Running Out of Food in the Last Year: Patient declined    Ran Out of Food in the Last Year: Patient declined  Transportation Needs: No Transportation Needs (07/19/2024)   PRAPARE - Administrator, Civil Service (Medical): No    Lack of Transportation (Non-Medical): No  Physical Activity: Insufficiently Active (07/19/2024)   Exercise Vital Sign    Days of Exercise per Week: 3 days    Minutes of Exercise per Session: 10 min   Stress: Stress Concern Present (07/19/2024)   [employer] of Occupational Health - Occupational Stress Questionnaire    Feeling of Stress: To some extent  Social Connections: Socially Integrated (07/19/2024)   Social Connection and Isolation Panel    Frequency of Communication with Friends and Family: Three times a week    Frequency of Social Gatherings with Friends and Family: Once a week    Attends Religious Services: More than 4 times per year    Active Member of [employer] or Organizations: Yes    Attends Engineer, structural: More than 4 times per year    Marital Status: Married  Catering manager Violence: Not At Risk (04/30/2023)   Received from [facility]   HITS    Over the last 12 months how often did your partner physically hurt you?: Never    Over the last 12 months how often did your partner insult you or talk down to you?: Sometimes    Over the last 12 months how often did your partner threaten you with physical harm?: Never    Over the last 12 months how often did your partner scream or curse at you?: Sometimes    Family History  Problem Relation Age of Onset   Diabetes Mother    Hypertension Mother    Heart failure Mother    Stroke Mother    High Cholesterol Mother    Depression Mother    Anxiety disorder Mother    Sleep apnea Mother    Obesity Mother    Obstructive Sleep Apnea Mother    Coronary artery disease Mother 93       s/p PCI   Diabetes Mellitus II Mother    Heart attack Mother 38   COPD Father        smoker   High blood pressure Father    High Cholesterol Father    Breast cancer Sister    Skin cancer Sister    HIV/AIDS Brother    Heart disease Brother 31       enlarged heart = presented with Acute Resp Failure   Hyperlipidemia Other    Migraines Neg Hx      Review of Systems  Constitutional: Negative.  Negative for chills and fever.  HENT:  Positive for congestion and  sinus pain.   Respiratory:  Positive for cough.    Cardiovascular: Negative.  Negative for chest pain and palpitations.  Gastrointestinal:  Negative for abdominal pain, diarrhea, nausea and vomiting.  Genitourinary: Negative.  Negative for dysuria and hematuria.       Urinary incontinence  Skin: Negative.  Negative for rash.  Neurological: Negative.  Negative for dizziness and headaches.  All other systems reviewed and are negative.   Vitals:   07/21/24 1325  BP: 114/68  Pulse: 80  Temp: 98.4 F (36.9 C)  SpO2: 95%    Physical Exam Vitals reviewed.  Constitutional:      Appearance: Normal appearance.  HENT:     Head: Normocephalic.     Mouth/Throat:     Mouth: Mucous membranes are moist.     Pharynx: Oropharynx is clear.  Eyes:     Extraocular Movements: Extraocular movements intact.     Pupils: Pupils are equal, round, and reactive to light.  Cardiovascular:     Rate and Rhythm: Normal rate and regular rhythm.     Pulses: Normal pulses.     Heart sounds: Normal heart sounds.  Pulmonary:     Effort: Pulmonary effort is normal.     Breath sounds: Normal breath sounds.  Musculoskeletal:     Cervical back: No tenderness.  Lymphadenopathy:     Cervical: No cervical adenopathy.  Skin:    General: Skin is warm and dry.     Capillary Refill: Capillary refill takes less than 2 seconds.  Neurological:     General: No focal deficit present.     Mental Status: She is alert and oriented to person, place, and time.  Psychiatric:        Mood and Affect: Mood normal.        Behavior: Behavior normal.    Results for orders placed or performed in visit on 07/21/24 (from the past 24 hours)  POCT HgB A1C     Status: Normal   Collection Time: 07/21/24  1:45 PM  Result Value Ref Range   Hemoglobin A1C     HbA1c POC (<> result, manual entry) 5.1 4.0 - 5.6 %   HbA1c, POC (prediabetic range)     HbA1c, POC (controlled diabetic range)       ASSESSMENT & PLAN: A total of 43 minutes was spent with the patient and  counseling/coordination of care regarding preparing for this visit, review of most recent office visit notes, review of multiple chronic medical conditions and their management, cardiovascular risks associated with diabetes and hypertension, review of all medications, review of most recent bloodwork results including interpretation of today's hemoglobin A1c, review of health maintenance items, education on nutrition, prognosis, documentation, and need for follow up.   Problem List Items Addressed This Visit       Cardiovascular and Mediastinum   Hypertension associated with diabetes (HCC) - Primary   BP Readings from Last 3 Encounters:  07/21/24 114/68  07/20/24 130/68  04/20/24 122/78  Well-controlled hypertension Continue valsartan  HCT 160-25 mg daily Well-controlled diabetes with hemoglobin A1c at 5.1 Eating better and losing weight Continue weekly Mounjaro  5 mg, metformin  500 mg daily and Farxiga  10 mg daily       Relevant Medications   Continuous Glucose Sensor (DEXCOM G7 SENSOR) MISC   metFORMIN  (GLUCOPHAGE -XR) 500 MG 24 hr tablet   valsartan -hydrochlorothiazide  (DIOVAN -HCT) 160-25 MG tablet     Respiratory   Bacterial sinusitis   Still symptomatic. Symptom management discussed Recommend to start Augmentin   875 mg twice a day for 7 days      Relevant Medications   amoxicillin -clavulanate (AUGMENTIN ) 875-125 MG tablet     Endocrine   Dyslipidemia associated with type 2 diabetes mellitus (HCC)   Chronic stable conditions Hemoglobin A1c of 5.1 Continue Zetia  10 mg daily Continue weekly Mounjaro  with daily metformin  and Farxiga  Diet and nutrition discussed      Relevant Medications   metFORMIN  (GLUCOPHAGE -XR) 500 MG 24 hr tablet   valsartan -hydrochlorothiazide  (DIOVAN -HCT) 160-25 MG tablet   Other Relevant Orders   POCT HgB A1C (Completed)     Other   Urinary incontinence   Active and affecting quality of life Recommend urology evaluation Referral placed today       Relevant Orders   Ambulatory referral to Urology   Patient Instructions  Diabetes Mellitus and Nutrition, Adult When you have diabetes, or diabetes mellitus, it is very important to have healthy eating habits because your blood sugar (glucose) levels are greatly affected by what you eat and drink. Eating healthy foods in the right amounts, at about the same times every day, can help you: Manage your blood glucose. Lower your risk of heart disease. Improve your blood pressure. Reach or maintain a healthy weight. What can affect my meal plan? Every person with diabetes is different, and each person has different needs for a meal plan. Your health care provider may recommend that you work with a dietitian to make a meal plan that is best for you. Your meal plan may vary depending on factors such as: The calories you need. The medicines you take. Your weight. Your blood glucose, blood pressure, and cholesterol levels. Your activity level. Other health conditions you have, such as heart or kidney disease. How do carbohydrates affect me? Carbohydrates, also called carbs, affect your blood glucose level more than any other type of food. Eating carbs raises the amount of glucose in your blood. It is important to know how many carbs you can safely have in each meal. This is different for every person. Your dietitian can help you calculate how many carbs you should have at each meal and for each snack. How does alcohol affect me? Alcohol can cause a decrease in blood glucose (hypoglycemia), especially if you use insulin  or take certain diabetes medicines by mouth. Hypoglycemia can be a life-threatening condition. Symptoms of hypoglycemia, such as sleepiness, dizziness, and confusion, are similar to symptoms of having too much alcohol. Do not drink alcohol if: Your health care provider tells you not to drink. You are pregnant, may be pregnant, or are planning to become pregnant. If you drink  alcohol: Limit how much you have to: 0-1 drink a day for women. 0-2 drinks a day for men. Know how much alcohol is in your drink. In the [place] one drink equals one 12 oz bottle of beer (355 mL), one 5 oz glass of wine (148 mL), or one 1 oz glass of hard liquor (44 mL). Keep yourself hydrated with water, diet soda, or unsweetened iced tea. Keep in mind that regular soda, juice, and other mixers may contain a lot of sugar and must be counted as carbs. What are tips for following this plan?  Reading food labels Start by checking the serving size on the Nutrition Facts label of packaged foods and drinks. The number of calories and the amount of carbs, fats, and other nutrients listed on the label are based on one serving of the item. Many items contain more than one serving  per package. Check the total grams (g) of carbs in one serving. Check the number of grams of saturated fats and trans fats in one serving. Choose foods that have a low amount or none of these fats. Check the number of milligrams (mg) of salt (sodium) in one serving. Most people should limit total sodium intake to less than 2,300 mg per day. Always check the nutrition information of foods labeled as low-fat or nonfat. These foods may be higher in added sugar or refined carbs and should be avoided. Talk to your dietitian to identify your daily goals for nutrients listed on the label. Shopping Avoid buying canned, pre-made, or processed foods. These foods tend to be high in fat, sodium, and added sugar. Shop around the outside edge of the grocery store. This is where you will most often find fresh fruits and vegetables, bulk grains, fresh meats, and fresh dairy products. Cooking Use low-heat cooking methods, such as baking, instead of high-heat cooking methods, such as deep frying. Cook using healthy oils, such as olive, canola, or sunflower oil. Avoid cooking with butter, cream, or high-fat meats. Meal planning Eat meals and  snacks regularly, preferably at the same times every day. Avoid going long periods of time without eating. Eat foods that are high in fiber, such as fresh fruits, vegetables, beans, and whole grains. Eat 4-6 oz (112-168 g) of lean protein each day, such as lean meat, chicken, fish, eggs, or tofu. One ounce (oz) (28 g) of lean protein is equal to: 1 oz (28 g) of meat, chicken, or fish. 1 egg.  cup (62 g) of tofu. Eat some foods each day that contain healthy fats, such as avocado, nuts, seeds, and fish. What foods should I eat? Fruits Berries. Apples. Oranges. Peaches. Apricots. Plums. Grapes. Mangoes. Papayas. Pomegranates. Kiwi. Cherries. Vegetables Leafy greens, including lettuce, spinach, kale, chard, collard greens, mustard greens, and cabbage. Beets. Cauliflower. Broccoli. Carrots. Green beans. Tomatoes. Peppers. Onions. Cucumbers. Brussels sprouts. Grains Whole grains, such as whole-wheat or whole-grain bread, crackers, tortillas, cereal, and pasta. Unsweetened oatmeal. Quinoa. Brown or wild rice. Meats and other proteins Seafood. Poultry without skin. Lean cuts of poultry and beef. Tofu. Nuts. Seeds. Dairy Low-fat or fat-free dairy products such as milk, yogurt, and cheese. The items listed above may not be a complete list of foods and beverages you can eat and drink. Contact a dietitian for more information. What foods should I avoid? Fruits Fruits canned with syrup. Vegetables Canned vegetables. Frozen vegetables with butter or cream sauce. Grains Refined white flour and flour products such as bread, pasta, snack foods, and cereals. Avoid all processed foods. Meats and other proteins Fatty cuts of meat. Poultry with skin. Breaded or fried meats. Processed meat. Avoid saturated fats. Dairy Full-fat yogurt, cheese, or milk. Beverages Sweetened drinks, such as soda or iced tea. The items listed above may not be a complete list of foods and beverages you should avoid. Contact a  dietitian for more information. Questions to ask a health care provider Do I need to meet with a certified diabetes care and education specialist? Do I need to meet with a dietitian? What number can I call if I have questions? When are the best times to check my blood glucose? Where to find more information: American Diabetes Association: diabetes.org Academy of Nutrition and Dietetics: eatright.[employer] of Diabetes and Digestive and Kidney Diseases: StageSync.si Association of Diabetes Care & Education Specialists: diabeteseducator.org Summary It is important to have healthy eating habits because  your blood sugar (glucose) levels are greatly affected by what you eat and drink. It is important to use alcohol carefully. A healthy meal plan will help you manage your blood glucose and lower your risk of heart disease. Your health care provider may recommend that you work with a dietitian to make a meal plan that is best for you. This information is not intended to replace advice given to you by your health care provider. Make sure you discuss any questions you have with your health care provider. Document Revised: 07/19/2020 Document Reviewed: 07/20/2020 Elsevier Patient Education  2024 Elsevier Inc.    Emil Schaumann, MD [facility]

## 2024-07-21 NOTE — Patient Instructions (Signed)

## 2024-07-21 NOTE — Assessment & Plan Note (Signed)
 BP Readings from Last 3 Encounters:  07/21/24 114/68  07/20/24 130/68  04/20/24 122/78  Well-controlled hypertension Continue valsartan  HCT 160-25 mg daily Well-controlled diabetes with hemoglobin A1c at 5.1 Eating better and losing weight Continue weekly Mounjaro  5 mg, metformin  500 mg daily and Farxiga  10 mg daily

## 2024-07-21 NOTE — Assessment & Plan Note (Signed)
 Chronic stable conditions Hemoglobin A1c of 5.1 Continue Zetia  10 mg daily Continue weekly Mounjaro  with daily metformin  and Farxiga  Diet and nutrition discussed

## 2024-07-21 NOTE — Telephone Encounter (Signed)
No problems by me RG 

## 2024-07-21 NOTE — Assessment & Plan Note (Signed)
 Active and affecting quality of life Recommend urology evaluation Referral placed today

## 2024-07-22 NOTE — Telephone Encounter (Signed)
 Patient has been advised of transfer

## 2024-07-24 ENCOUNTER — Encounter: Payer: Self-pay | Admitting: Cardiology

## 2024-07-24 NOTE — Assessment & Plan Note (Signed)
 Hypertension well-controlled. Weight loss and lifestyle modifications contributing positively. - Continue current dose of valsartan  and HCTZ (160-25 mg daily) - Encourage continued weight loss and lifestyle modifications.

## 2024-07-24 NOTE — Assessment & Plan Note (Signed)
 Coronary CTA results reviewed with calcium  score of 0 with no evidence of CAD.  Very reassuring. Placed her quite low risk, but with her other CRF's, would probably shoot for LDL least less than 100, A1c less than 5.7 and blood pressure still controlled.

## 2024-07-24 NOTE — Assessment & Plan Note (Signed)
 Continue CPAP.

## 2024-07-24 NOTE — Assessment & Plan Note (Signed)
 Currently now on Mounjaro  which hopefully will help with weight loss.  Glycemic control seems to be improved with A1c down to 6.3.  BP borderline controlled.  Hopefully with weight loss all these factors will reduce.

## 2024-07-24 NOTE — Assessment & Plan Note (Signed)
 Hyperlipidemia with LDL at 144. Previous intolerance to Crestor . Zetia  was discontinued but reassured it should not cause muscle issues. Discussed importance of lowering LDL to less than 100, especially with diabetes, to prevent cardiovascular complications. - Restart Zetia . - Monitor cholesterol levels, aiming for LDL less than 100. - Continue lifestyle modifications including diet and exercise. - Reassess cholesterol levels in six months.  Discussed potential need to consider additional therapy which could simply be Nexletol versus potentially statin or other medication.  Type 2 diabetes with recent A1c of 6. Weight loss of 15 pounds noted. Discussed importance of blood sugar control to prevent complications, especially with hyperlipidemia. - Continue Mounjaro  and metformin . - Check labs every three months. - Encourage continued weight loss and lifestyle modifications.

## 2024-07-28 ENCOUNTER — Ambulatory Visit: Admitting: Gastroenterology

## 2024-07-28 NOTE — Progress Notes (Unsigned)
 07/29/2024 MECKENZIE BALSLEY [id number] [birth year]  Referring provider: Purcell Emil Schanz, * Primary GI doctor: Dr. Charlanne  ASSESSMENT AND PLAN:  Constipation/diarrhea alternating predominate constipation Will have AB pain during BM, unable to have BM and then will have sweating and can have syncope September 2016 colonoscopy negative microscopic colitis did have TA October 2021 unremarkable colonoscopy recall 5 years 07/17/2023 KUB mild fecal retention nonobstructive bowel gas pattern Just finished ABX for sinus infection BM soft normally between these episodes, 1-3 x a day, slightly harder last 3 weeks, incomplete Bm's Has been on mounjaro  x April 2021 Likely secondary to mounjaro /medications, versus IBS mixed, versus pelvic floor, versus SIBO - Increase fiber/ water intake, decrease caffeine, increase activity level. -CT AB and pelvis with contrast to evaluate AB pain - check celiac panel - check ESR/CRP -Will add on Miralax daily and Benefiber, consider linzess -possible component of pelvis floor dysfunction with history and symptoms. Will refer pelvic floor PT  GERD with worsening sensation of food sitting in her AB, bloating AB pain S/p cholecystectomy  09/2015 EGD mild esophagitis, increased intraepithelial lymphocytes duodenum.   Genetic testing negative for celiac On Protonix  40 mg as needed, start to daily -Lifestyle changes discussed, avoid NSAIDS, ETOH, hand out given to the patient -Schedule EGD at [facility] to evaluate GERD, esophagitis, hiatal hernia,H pylori. I discussed risks of EGD with patient today, including risk of sedation, bleeding or perforation. Patient provides understanding and gave verbal consent to proceed. - CT AB pelvis with contrast for bloating, AB pain -gastroparesis diet given likely from GLP1 - check celiac panel - check ESR/CRP  Personal history of adenomatous polyps No family history of colon cancer September 2016 colonoscopy negative  microscopic colitis did have TA October 2021 unremarkable colonoscopy recall 5 years, Oct 2026 Will add in recall  Diabetes with CKD Discussed GLP1 with the patient, mechanism of action. Gastroparesis diet given to the patient.  Patient should be instructed to hold this medications if dose falls within 7 days of endoscopic procedure, due to increased risk of retained gastric contents.  Morbid obesity  Body mass index is 42.43 kg/m.  -Patient has been advised to make an attempt to improve diet and exercise patterns to aid in weight loss. -Recommended diet heavy in fruits and veggies and low in animal meats, cheeses, and dairy products, appropriate calorie intake   Patient Care Team: Purcell Emil Schanz, MD as PCP - General (Internal Medicine) Anner Alm ORN, MD as PCP - Cardiology (Cardiology) Rutherford Gain, MD (Obstetrics and Gynecology)  HISTORY OF PRESENT ILLNESS: 53 y.o. female with a past medical history listed below presents for evaluation of constipation and bloating.   Previously seen by digestive health, last seen 08/06/2023 for constipation and fecal retention.  Discussed the use of AI scribe software for clinical note transcription with the patient, who gave verbal consent to proceed.  History of Present Illness   SOPHEE MCKIMMY is a 53 year old female with a history of polyps and diabetes who presents with gastrointestinal symptoms including constipation and bloating.  She experiences alternating episodes of constipation and diarrhea, with severe episodes of cramping, sweating, and fainting followed by a large bowel movement. These episodes occur once or twice a year and leave her feeling drained. In between these episodes, she experiences more frequent constipation, with stools that are usually soft but have been harder in the last three weeks, which she attributes to medication. She typically has a bowel movement daily, sometimes more  than once, but this has  slowed in the past five months.  She has a history of gastrointestinal issues, including a gallbladder removal in 2006-2007, and has experienced bloating and discomfort since then. She describes a sensation of food sitting in her stomach, leading to burping and tasting food. She has been on pantoprazole  40 mg as needed since an endoscopy in 2016 showed mild esophagitis. She uses it before consuming foods that might trigger acid reflux.  She reports being diagnosed with diabetes and starting Mounjaro  in April 2025, with the medication prescribed around April 20, 2024. She also takes medication for kidney protection, cholesterol, and high blood pressure. She recently completed a course of antibiotics for a sinus infection.  No family history of colon cancer and has had polyps in the past, but her last colonoscopy in 2021 did not show any polyps. No history of heart or lung disease and recently had normal evaluations from her cardiologist and pulmonologist.  She experiences bloating and gas with every meal and reports no history of black or bloody stools. She does not consume alcohol, smoke, or use drugs, and she quit smoking four years ago. She experiences frequent urination and has been referred to a urologist for evaluation.      She  reports that she quit smoking about 3 years ago. Her smoking use included cigarettes. She has been exposed to tobacco smoke. She has never used smokeless tobacco. She reports that she does not drink alcohol and does not use drugs.  RELEVANT GI HISTORY, IMAGING AND LABS: Results   Colonoscopy: No polyps (09/2020) Endoscopy: Mild esophagitis; small intestine abnormality suggestive of celiac disease, genetic test negative (2016)      CBC    Component Value Date/Time   WBC 9.3 04/20/2024 0916   RBC 4.57 04/20/2024 0916   HGB 13.4 04/20/2024 0916   HCT 40.6 04/20/2024 0916   PLT 396.0 04/20/2024 0916   MCV 88.8 04/20/2024 0916   MCH 30.0 04/03/2022 1240   MCHC  33.0 04/20/2024 0916   RDW 12.9 04/20/2024 0916   LYMPHSABS 3.3 04/20/2024 0916   MONOABS 0.6 04/20/2024 0916   EOSABS 0.1 04/20/2024 0916   BASOSABS 0.0 04/20/2024 0916   Recent Labs    02/13/24 1355 02/26/24 1508 04/20/24 0916  HGB 13.6 13.3 13.4    CMP     Component Value Date/Time   NA 141 04/20/2024 0916   NA 141 10/10/2022 1131   K 3.9 04/20/2024 0916   CL 104 04/20/2024 0916   CO2 28 04/20/2024 0916   GLUCOSE 130 (H) 04/20/2024 0916   BUN 18 04/20/2024 0916   BUN 14 10/10/2022 1131   CREATININE 1.04 04/20/2024 0916   CALCIUM  9.4 04/20/2024 0916   PROT 7.0 04/20/2024 0916   PROT 7.1 10/10/2022 1131   ALBUMIN 4.1 04/20/2024 0916   ALBUMIN 4.2 10/10/2022 1131   AST 15 04/20/2024 0916   ALT 22 04/20/2024 0916   ALKPHOS 66 04/20/2024 0916   BILITOT 0.5 04/20/2024 0916   BILITOT 0.4 10/10/2022 1131   GFRNONAA >60 04/03/2022 1240   GFRAA >60 02/03/2019 0013      Latest Ref Rng & Units 04/20/2024    9:16 AM 02/13/2024    1:55 PM 04/12/2023    1:51 PM  Hepatic Function  Total Protein 6.0 - 8.3 g/dL 7.0  6.8  6.8   Albumin 3.5 - 5.2 g/dL 4.1  4.1  4.1   AST 0 - 37 U/L 15  12  12  ALT 0 - 35 U/L 22  20  18    Alk Phosphatase 39 - 117 U/L 66  76  69   Total Bilirubin 0.2 - 1.2 mg/dL 0.5  0.3  0.6       Current Medications:   Current Outpatient Medications (Endocrine & Metabolic):    dapagliflozin  propanediol (FARXIGA ) 10 MG TABS tablet, Take 1 tablet (10 mg total) by mouth daily before breakfast.   metFORMIN  (GLUCOPHAGE -XR) 500 MG 24 hr tablet, Take 1 tablet (500 mg total) by mouth daily with breakfast.   tirzepatide  (MOUNJARO ) 5 MG/0.5ML Pen, Inject 5 mg into the skin once a week.   Current Outpatient Medications (Cardiovascular):    ezetimibe  (ZETIA ) 10 MG tablet, Take 1 tablet (10 mg total) by mouth daily.   valsartan -hydrochlorothiazide  (DIOVAN -HCT) 160-25 MG tablet, Take 1 tablet by mouth daily.   Current Outpatient Medications (Respiratory):     albuterol  (VENTOLIN  HFA) 108 (90 Base) MCG/ACT inhaler, Inhale 2 puffs into the lungs every 6 (six) hours as needed for wheezing or shortness of breath.   fluticasone  (FLONASE ) 50 MCG/ACT nasal spray, Place 1 spray into both nostrils in the morning and at bedtime. (Patient taking differently: Place 1 spray into both nostrils as needed.)       Current Outpatient Medications (Other):    ACCU-CHEK GUIDE TEST test strip, USE AS DIRECTED IN THE MORNING, AT NOON, AND AT BEDTIME.   AMBULATORY NON FORMULARY MEDICATION, Compression knee sleeve for pre-patellar bursitis Dispense 1 pre-patellar bursitis Use as needed   Blood Glucose Monitoring Suppl DEVI, 1 each by Does not apply route in the morning, at noon, and at bedtime. May substitute to any manufacturer covered by patient's insurance.   Blood Pressure Monitoring (OMRON 3 SERIES BP MONITOR) DEVI, Use to check blood pressure daily.   ciclopirox  (PENLAC ) 8 % solution, Apply topically at bedtime. Apply over nail and surrounding skin. Apply daily over previous coat. After seven (7) days, may remove with alcohol and continue cycle.   Continuous Glucose Receiver (DEXCOM G7 RECEIVER) DEVI, To check for blood sugar   Continuous Glucose Sensor (DEXCOM G7 SENSOR) MISC, Pt to use to check sugar level every   diclofenac  Sodium (VOLTAREN ) 1 % GEL, Apply 4 g topically 4 (four) times daily. To affected joint.   hydrOXYzine  (VISTARIL ) 25 MG capsule, TAKE 1 CAPSULE (25 MG TOTAL) BY MOUTH EVERY 8 (EIGHT) HOURS AS NEEDED FOR ANXIETY.   Vitamin D , Ergocalciferol , (DRISDOL ) 1.25 MG (50000 UNIT) CAPS capsule, Take 1 capsule (50,000 Units total) by mouth every 7 (seven) days for 12 doses.   pantoprazole  (PROTONIX ) 40 MG tablet, Take 1 tablet (40 mg total) by mouth daily.  Current Facility-Administered Medications (Other):    diclofenac  Sodium (VOLTAREN ) 1 % topical gel 4 g  Medical History:  Past Medical History:  Diagnosis Date   Diabetes mellitus without  complication (HCC)    Fibroids    Heartburn    History of headache    Hyperlipidemia    Hypertension    Obesity    Thyroid  condition    Vitamin D  deficiency    Allergies:  Allergies  Allergen Reactions   Crestor  [Rosuvastatin  Calcium ] Other (See Comments)    Severe myalgia   Gadolinium Derivatives Other (See Comments)    PT STARTED SHAKING AND FELT VERY COLD INTERNALLY BUT FACE WAS VERY FLUSHED. THIS WAS A DELAYED REACTION (ABOUT 1 HOUR AFTER INJECTION). SHE TOOK BENADRYL  AND HYDRATED AND SYMPTOMS SUBSIDED     Surgical History:  She  has a past surgical history that includes Cholecystectomy (12/31/2004); Wisdom tooth extraction; Tonsillectomy; Robot assisted mtomectomy (12/31/2009); Stress Echocardiogram (08/2012); transthoracic echocardiogram (08/15/2022); and Pulmonary function tests (09/14/2022). Family History:  Her family history includes Anxiety disorder in her mother; Breast cancer in her sister; COPD in her father; Coronary artery disease (age of onset: 33) in her mother; Depression in her mother; Diabetes in her mother; Diabetes Mellitus II in her mother; HIV/AIDS in her brother; Heart attack (age of onset: 58) in her mother; Heart disease (age of onset: 18) in her brother; Heart failure in her mother; High Cholesterol in her father and mother; High blood pressure in her father; Hyperlipidemia in an other family member; Hypertension in her mother; Obesity in her mother; Obstructive Sleep Apnea in her mother; Skin cancer in her sister; Sleep apnea in her mother; Stroke in her mother.  REVIEW OF SYSTEMS  : All other systems reviewed and negative except where noted in the History of Present Illness.  PHYSICAL EXAM: BP 110/76   Pulse 76   Ht 5' 5 (1.651 m)   Wt 255 lb (115.7 kg)   LMP 02/28/2017   BMI 42.43 kg/m  Physical Exam   GENERAL APPEARANCE: Well nourished, in no apparent distress. HEENT: No cervical lymphadenopathy, unremarkable thyroid , sclerae anicteric,  conjunctiva pink. RESPIRATORY: Respiratory effort normal, breath sounds clear and equal bilaterally without rales, rhonchi, or wheezing. CARDIO: RRR with no MRGs, peripheral pulses intact. ABDOMEN: Soft, non distended, active bowel sounds in all 4 quadrants, no tenderness to palpation, no rebound, no mass appreciated. RECTAL: Declines. MUSCULOSKELETAL: Full ROM, normal gait, without edema. SKIN: Dry, intact without rashes or lesions. No jaundice. NEURO: Alert, oriented, no focal deficits. PSYCH: Cooperative, normal mood and affect.      Alan JONELLE Coombs, PA-C 3:04 PM

## 2024-07-29 ENCOUNTER — Ambulatory Visit (INDEPENDENT_AMBULATORY_CARE_PROVIDER_SITE_OTHER): Admitting: Physician Assistant

## 2024-07-29 ENCOUNTER — Encounter: Payer: Self-pay | Admitting: Physician Assistant

## 2024-07-29 ENCOUNTER — Other Ambulatory Visit (INDEPENDENT_AMBULATORY_CARE_PROVIDER_SITE_OTHER)

## 2024-07-29 ENCOUNTER — Ambulatory Visit: Payer: Self-pay | Admitting: Physician Assistant

## 2024-07-29 VITALS — BP 110/76 | HR 76 | Ht 65.0 in | Wt 255.0 lb

## 2024-07-29 DIAGNOSIS — R1032 Left lower quadrant pain: Secondary | ICD-10-CM

## 2024-07-29 DIAGNOSIS — K219 Gastro-esophageal reflux disease without esophagitis: Secondary | ICD-10-CM | POA: Diagnosis not present

## 2024-07-29 DIAGNOSIS — E876 Hypokalemia: Secondary | ICD-10-CM

## 2024-07-29 DIAGNOSIS — K581 Irritable bowel syndrome with constipation: Secondary | ICD-10-CM

## 2024-07-29 DIAGNOSIS — E1169 Type 2 diabetes mellitus with other specified complication: Secondary | ICD-10-CM | POA: Diagnosis not present

## 2024-07-29 LAB — CBC WITH DIFFERENTIAL/PLATELET
Basophils Absolute: 0 K/uL (ref 0.0–0.1)
Basophils Relative: 0.4 % (ref 0.0–3.0)
Eosinophils Absolute: 0.1 K/uL (ref 0.0–0.7)
Eosinophils Relative: 1.1 % (ref 0.0–5.0)
HCT: 40.4 % (ref 36.0–46.0)
Hemoglobin: 13.3 g/dL (ref 12.0–15.0)
Lymphocytes Relative: 33.8 % (ref 12.0–46.0)
Lymphs Abs: 3.4 K/uL (ref 0.7–4.0)
MCHC: 32.9 g/dL (ref 30.0–36.0)
MCV: 85.6 fl (ref 78.0–100.0)
Monocytes Absolute: 0.6 K/uL (ref 0.1–1.0)
Monocytes Relative: 5.9 % (ref 3.0–12.0)
Neutro Abs: 6 K/uL (ref 1.4–7.7)
Neutrophils Relative %: 58.8 % (ref 43.0–77.0)
Platelets: 409 K/uL — ABNORMAL HIGH (ref 150.0–400.0)
RBC: 4.72 Mil/uL (ref 3.87–5.11)
RDW: 13.4 % (ref 11.5–15.5)
WBC: 10.2 K/uL (ref 4.0–10.5)

## 2024-07-29 LAB — COMPREHENSIVE METABOLIC PANEL WITH GFR
ALT: 18 U/L (ref 0–35)
AST: 12 U/L (ref 0–37)
Albumin: 4.2 g/dL (ref 3.5–5.2)
Alkaline Phosphatase: 72 U/L (ref 39–117)
BUN: 16 mg/dL (ref 6–23)
CO2: 29 meq/L (ref 19–32)
Calcium: 9.3 mg/dL (ref 8.4–10.5)
Chloride: 100 meq/L (ref 96–112)
Creatinine, Ser: 0.89 mg/dL (ref 0.40–1.20)
GFR: 73.1 mL/min (ref >60.00)
Glucose, Bld: 110 mg/dL — ABNORMAL HIGH (ref 70–99)
Potassium: 3.1 meq/L — ABNORMAL LOW (ref 3.5–5.1)
Sodium: 137 meq/L (ref 135–145)
Total Bilirubin: 0.5 mg/dL (ref 0.2–1.2)
Total Protein: 7.3 g/dL (ref 6.0–8.3)

## 2024-07-29 LAB — C-REACTIVE PROTEIN: CRP: 1.4 mg/dL (ref 0.5–20.0)

## 2024-07-29 LAB — SEDIMENTATION RATE: Sed Rate: 20 mm/h (ref 0–30)

## 2024-07-29 MED ORDER — PANTOPRAZOLE SODIUM 40 MG PO TBEC: 40.0000 mg | DELAYED_RELEASE_TABLET | Freq: Every day | ORAL | 1 refills | Status: AC

## 2024-07-29 MED ORDER — POTASSIUM CHLORIDE CRYS ER 20 MEQ PO TBCR: 20.0000 meq | EXTENDED_RELEASE_TABLET | Freq: Once | ORAL | 0 refills | Status: AC

## 2024-07-29 NOTE — Patient Instructions (Signed)
 Your provider has requested that you go to the basement level for lab work before leaving today. Press B on the elevator. The lab is located at the first door on the left as you exit the elevator.  You have been scheduled for a CT scan of the abdomen and pelvis at [facility], 1st floor Radiology. You are scheduled on 08/04/24 at 4:00pm. You should arrive at 1:45pm for your appointment time for registration.   You may take any medications as prescribed with a small amount of water, if necessary. If you take any of the following medications: METFORMIN , GLUCOPHAGE , GLUCOVANCE, AVANDAMET, RIOMET , FORTAMET , ACTOPLUS MET, JANUMET, GLUMETZA  or METAGLIP, you MAY be asked to HOLD this medication 48 hours AFTER the exam.   The purpose of you drinking the oral contrast is to aid in the visualization of your intestinal tract. The contrast solution may cause some diarrhea. Depending on your individual set of symptoms, you may also receive an intravenous injection of x-ray contrast/dye. Plan on being at [facility] for 45 minutes or longer, depending on the type of exam you are having performed.   If you have any questions regarding your exam or if you need to reschedule, you may call [facility] at [phone] between the hours of 8:00 am and 5:00 pm, Monday-Friday.   You have been scheduled for an endoscopy. Please follow written instructions given to you at your visit today.  If you use inhalers (even only as needed), please bring them with you on the day of your procedure.  If you take any of the following medications, they will need to be adjusted prior to your procedure:   DO NOT TAKE 7 DAYS PRIOR TO TEST- Trulicity (dulaglutide) Ozempic, Wegovy (semaglutide ) Mounjaro  (tirzepatide ) Bydureon Bcise (exanatide extended release)  DO NOT TAKE 1 DAY PRIOR TO YOUR TEST Rybelsus  (semaglutide ) Adlyxin (lixisenatide) Victoza (liraglutide) Byetta  (exanatide) ___________________________________________________________________________  _______________________________________________________  If your blood pressure at your visit was 140/90 or greater, please contact your primary care physician to follow up on this.  _______________________________________________________  If you are age 62 or older, your body mass index should be between 23-30. Your Body mass index is 42.43 kg/m. If this is out of the aforementioned range listed, please consider follow up with your Primary Care Provider.  If you are age 21 or younger, your body mass index should be between 19-25. Your Body mass index is 42.43 kg/m. If this is out of the aformentioned range listed, please consider follow up with your Primary Care Provider.   ________________________________________________________  The [facility] providers would like to encourage you to use MYCHART to communicate with providers for non-urgent requests or questions.  Due to long hold times on the telephone, sending your provider a message by [facility] may be a faster and more efficient way to get a response.  Please allow 48 business hours for a response.  Please remember that this is for non-urgent requests.  _______________________________________________________  [facility] is using a team-based approach to care.  Your team is made up of your doctor and two to three APPS. Our APPS (Nurse Practitioners and Physician Assistants) work with your physician to ensure care continuity for you. They are fully qualified to address your health concerns and develop a treatment plan. They communicate directly with your gastroenterologist to care for you. Seeing the Advanced Practice Practitioners on your physician's team can help you by facilitating care more promptly, often allowing for earlier appointments, access to diagnostic testing,  procedures, and other specialty referrals.    Stay on the protonix   40 mg once a day for at least 2 weeks 30 mins before food  Miralax is an osmotic laxative.  It only brings more water into the stool.  This is safe to take daily.  Can take up to 17 gram of miralax twice a day.  Mix with juice or coffee.  Start 1 capful at night for 3-4 days and reassess your response in 3-4 days.  You can increase and decrease the dose based on your response.  Remember, it can take up to 3-4 days to take effect OR for the effects to wear off.   I often pair this with benefiber in the morning to help assure the stool is not too loose.  FIBER SUPPLEMENT You can do metamucil or fibercon once or twice a day but if this causes gas/bloating please switch to Benefiber or Citracel.  Fiber is good for constipation/diarrhea/irritable bowel syndrome.  It can also help with weight loss and can help lower your bad cholesterol (LDL).  Please do 1 TBSP in the morning in water, coffee, or tea.  It can take up to a month before you can see a difference with your bowel movements.  It is cheapest from [employer], [employer], [employer].   Gastroparesis Gastroparesis is a condition in which food takes longer than normal to empty from the stomach.  This condition is also known as delayed gastric emptying. It is usually a long-term (chronic) condition.  What are the signs or symptoms? Symptoms of this condition include: Feeling full after eating very little or a loss of appetite. Nausea, vomiting, or heartburn. Bloating of your abdomen. Inconsistent blood sugar (glucose) levels on blood tests. Unexplained weight loss. Acid from the stomach coming up into the esophagus (gastroesophageal reflux). Sudden tightening (spasm) of the stomach, which can be painful. Symptoms may come and go. Some people may not notice any symptoms.  What increases the risk? You are more likely to develop this condition if: You have certain disorders or diseases. These may include: An endocrine disorder. An eating  disorder. Amyloidosis. Scleroderma. Parkinson's disease. Multiple sclerosis. Cancer or infection of the stomach or the vagus nerve. You have had surgery on your stomach or vagus nerve. You take certain medicines. You are female.  Things you can do: Please do small frequent meals like 4-6 meals a day.  Eat and drink liquids at separate times.  Avoid high fiber foods, cook your vegetables, avoid high fat food.  Suggest spreading protein throughout the day (greek yogurt, glucerna, soft meat, milk, eggs) Choose soft foods that you can mash with a fork When you are more symptomatic, change to pureed foods foods and liquids.  Consider reading Living well with Gastroparesis by [facility] Check out this link to a diet online https://my.GroupJournal.fr   Silent reflux: Not all heartburn burns...SABRASABRASABRA  What is LPR? Laryngopharyngeal reflux (LPR) or silent reflux is a condition in which acid that is made in the stomach travels up the esophagus (swallowing tube) and gets to the throat. Not everyone with reflux has a lot of heartburn or indigestion. In fact, many people with LPR never have heartburn. This is why LPR is called SILENT REFLUX, and the terms Silent reflux and LPR are often used interchangeably. Because LPR is silent, it is sometimes difficult to diagnose.  How can you tell if you have LPR?  Chronic hoarseness- Some people have hoarseness that comes and goes throat clearing  Cough  It can cause shortness of breath and cause asthma like symptoms. a feeling of a lump in the throat  difficulty swallowing a problem with too much nose and throat drainage.  Some people will feel their esophagus spasm which feels like their heart beating hard and fast, this will usually be after a meal, at rest, or lying down at night.    How do I treat this? Treatment for LPR should be individualized,  and your doctor will suggest the best treatment for you. Generally there are several treatments for LPR: changing habits and diet to reduce reflux,  medications to reduce stomach acid, and  surgery to prevent reflux. Most people with LPR need to modify how and when they eat, as well as take some medication, to get well. Sometimes, nonprescription liquid antacids, such as Maalox, Gelucil and Mylanta are recommended. When used, these antacids should be taken four times each day - one tablespoon one hour after each meal and before bedtime. Dietary and lifestyle changes alone are not often enough to control LPR - medications that reduce stomach acid are also usually needed. These must be prescribed by our doctor.   TIPS FOR REDUCING REFLUX AND LPR Control your LIFE-STYLE and your DIET! If you use tobacco, QUIT.  Smoking makes you reflux. After every cigarette you have some LPR.  Don't wear clothing that is too tight, especially around the waist (trousers, corsets, belts).  Do not lie down just after eating...in fact, do not eat within three hours of bedtime.  You should be on a low-fat diet.  Limit your intake of red meat.  Limit your intake of butter.  Avoid fried foods.  Avoid chocolate  Avoid cheese.  Avoid eggs. Specifically avoid caffeine (especially coffee and tea), soda pop (especially cola) and mints.  Avoid alcoholic beverages, particularly in the evening.  Here some information about pelvic floor dysfunction. This may be contributing to some of your symptoms. We will continue with our evaluation but I do want you to consider adding on fiber supplement with low-dose MiraLAX daily. We could also refer to pelvic floor physical therapy.   Pelvic Floor Dysfunction, Female Pelvic floor dysfunction (PFD) is a condition that results when the group of muscles and connective tissues that support the organs in the pelvis (pelvic floor muscles) do not work well. These muscles and their  connections form a sling that supports the colon and bladder. In women, they also support the uterus. PFD causes pelvic floor muscles to be too weak, too tight, or both. In PFD, muscle movements are not coordinated. This may cause bowel or bladder problems. It may also cause pain. What are the causes? This condition may be caused by an injury to the pelvic area or by a weakening of pelvic muscles. This often results from pregnancy and childbirth or other types of strain. In many cases, the exact cause is not known. What increases the risk? The following factors may make you more likely to develop this condition: Having chronic bladder tissue inflammation (interstitial cystitis). Being an older person. Being overweight. History of radiation treatment for cancer in the pelvic region. Previous pelvic surgery, such as removal of the uterus (hysterectomy). What are the signs or symptoms? Symptoms of this condition vary and may include: Bladder symptoms, such as: Trouble starting urination and emptying the bladder. Frequent urinary tract infections. Leaking urine when coughing, laughing, or exercising (stress incontinence). Having to pass urine urgently or frequently. Pain when passing urine. Bowel symptoms, such as: Constipation.  Urgent or frequent bowel movements. Incomplete bowel movements. Painful bowel movements. Leaking stool or gas. Unexplained genital or rectal pain. Genital or rectal muscle spasms. Low back pain. Other symptoms may include: A heavy, full, or aching feeling in the vagina. A bulge that protrudes into the vagina. Pain during or after sex. How is this diagnosed? This condition may be diagnosed based on: Your symptoms and medical history. A physical exam. During the exam, your health care provider may check your pelvic muscles for tightness, spasm, pain, or weakness. This may include a rectal exam and a pelvic exam. In some cases, you may have diagnostic tests, such  as: Electrical muscle function tests. Urine flow testing. X-ray tests of bowel function. Ultrasound of the pelvic organs. How is this treated? Treatment for this condition depends on the symptoms. Treatment options include: Physical therapy. This may include Kegel exercises to help relax or strengthen the pelvic floor muscles. Biofeedback. This type of therapy provides feedback on how tight your pelvic floor muscles are so that you can learn to control them. Internal or external massage therapy. A treatment that involves electrical stimulation of the pelvic floor muscles to help control pain (transcutaneous electrical nerve stimulation, or TENS). Sound wave therapy (ultrasound) to reduce muscle spasms. Medicines, such as: Muscle relaxants. Bladder control medicines. Surgery to reconstruct or support pelvic floor muscles may be an option if other treatments do not help. Follow these instructions at home: Activity Do your usual activities as told by your health care provider. Ask your health care provider if you should modify any activities. Do pelvic floor strengthening or relaxing exercises at home as told by your physical therapist. Lifestyle Maintain a healthy weight. Eat foods that are high in fiber, such as beans, whole grains, and fresh fruits and vegetables. Limit foods that are high in fat and processed sugars, such as fried or sweet foods. Manage stress with relaxation techniques such as yoga or meditation. General instructions If you have problems with leakage: Use absorbable pads or wear padded underwear. Wash frequently with mild soap. Keep your genital and anal area as clean and dry as possible. Ask your health care provider if you should try a barrier cream to prevent skin irritation. Take warm baths to relieve pelvic muscle tension or spasms. Take over-the-counter and prescription medicines only as told by your health care provider. Keep all follow-up visits. How is  this prevented? The cause of PFD is not always known, but there are a few things you can do to reduce the risk of developing this condition, including: Staying at a healthy weight. Getting regular exercise. Managing stress. Contact a health care provider if: Your symptoms are not improving with home care. You have signs or symptoms of PFD that get worse at home. You develop new signs or symptoms. You have signs of a urinary tract infection, such as: Fever. Chills. Increased urinary frequency. A burning feeling when urinating. You have not had a bowel movement in 3 days (constipation). Summary Pelvic floor dysfunction results when the muscles and connective tissues in your pelvic floor do not work well. These muscles and their connections form a sling that supports your colon and bladder. In women, they also support the uterus. PFD may be caused by an injury to the pelvic area or by a weakening of pelvic muscles. PFD causes pelvic floor muscles to be too weak, too tight, or a combination of both. Symptoms may vary from person to person. In most cases, PFD can be treated  with physical therapies and medicines. Surgery may be an option if other treatments do not help. This information is not intended to replace advice given to you by your health care provider. Make sure you discuss any questions you have with your health care provider. Document Revised: 04/26/2021 Document Reviewed: 04/26/2021 Elsevier Patient Education  2022 [employer].

## 2024-07-30 ENCOUNTER — Encounter: Payer: Self-pay | Admitting: Gastroenterology

## 2024-07-31 ENCOUNTER — Ambulatory Visit (AMBULATORY_SURGERY_CENTER): Admitting: Gastroenterology

## 2024-07-31 ENCOUNTER — Encounter: Payer: Self-pay | Admitting: Gastroenterology

## 2024-07-31 VITALS — BP 127/80 | HR 63 | Temp 97.2°F | Resp 17 | Ht 65.0 in | Wt 255.0 lb

## 2024-07-31 DIAGNOSIS — K2970 Gastritis, unspecified, without bleeding: Secondary | ICD-10-CM | POA: Diagnosis present

## 2024-07-31 DIAGNOSIS — K219 Gastro-esophageal reflux disease without esophagitis: Secondary | ICD-10-CM

## 2024-07-31 LAB — TISSUE TRANSGLUTAMINASE, IGA: (tTG) Ab, IgA: <1 U/mL

## 2024-07-31 LAB — IGA: Immunoglobulin A: 317 mg/dL — ABNORMAL HIGH (ref 47–310)

## 2024-07-31 MED ORDER — SODIUM CHLORIDE 0.9 % IV SOLN: 500.0000 mL | Freq: Once | INTRAVENOUS | Status: DC

## 2024-07-31 NOTE — Progress Notes (Signed)
 07/29/2024 Amanda Mcintosh [id number] 1971/11/13   Referring provider: Purcell Emil Schanz, * Primary GI doctor: Dr. Charlanne   ASSESSMENT AND PLAN:  Constipation/diarrhea alternating predominate constipation Will have AB pain during BM, unable to have BM and then will have sweating and can have syncope September 2016 colonoscopy negative microscopic colitis did have TA October 2021 unremarkable colonoscopy recall 5 years 07/17/2023 KUB mild fecal retention nonobstructive bowel gas pattern Just finished ABX for sinus infection BM soft normally between these episodes, 1-3 x a day, slightly harder last 3 weeks, incomplete Bm's Has been on mounjaro  x April 2021 Likely secondary to mounjaro /medications, versus IBS mixed, versus pelvic floor, versus SIBO - Increase fiber/ water intake, decrease caffeine, increase activity level. -CT AB and pelvis with contrast to evaluate AB pain - check celiac panel - check ESR/CRP -Will add on Miralax daily and Benefiber, consider linzess -possible component of pelvis floor dysfunction with history and symptoms. Will refer pelvic floor PT   GERD with worsening sensation of food sitting in her AB, bloating AB pain S/p cholecystectomy  09/2015 EGD mild esophagitis, increased intraepithelial lymphocytes duodenum.   Genetic testing negative for celiac On Protonix  40 mg as needed, start to daily -Lifestyle changes discussed, avoid NSAIDS, ETOH, hand out given to the patient -Schedule EGD at [facility] to evaluate GERD, esophagitis, hiatal hernia,H pylori. I discussed risks of EGD with patient today, including risk of sedation, bleeding or perforation. Patient provides understanding and gave verbal consent to proceed. - CT AB pelvis with contrast for bloating, AB pain -gastroparesis diet given likely from GLP1 - check celiac panel - check ESR/CRP   Personal history of adenomatous polyps No family history of colon cancer September 2016 colonoscopy negative  microscopic colitis did have TA October 2021 unremarkable colonoscopy recall 5 years, Oct 2026 Will add in recall   Diabetes with CKD Discussed GLP1 with the patient, mechanism of action. Gastroparesis diet given to the patient.  Patient should be instructed to hold this medications if dose falls within 7 days of endoscopic procedure, due to increased risk of retained gastric contents.   Morbid obesity  Body mass index is 42.43 kg/m.  -Patient has been advised to make an attempt to improve diet and exercise patterns to aid in weight loss. -Recommended diet heavy in fruits and veggies and low in animal meats, cheeses, and dairy products, appropriate calorie intake     Patient Care Team: Purcell Emil Schanz, MD as PCP - General (Internal Medicine) Anner Alm ORN, MD as PCP - Cardiology (Cardiology) Rutherford Gain, MD (Obstetrics and Gynecology)   HISTORY OF PRESENT ILLNESS: 53 y.o. female with a past medical history listed below presents for evaluation of constipation and bloating.    Previously seen by digestive health, last seen 08/06/2023 for constipation and fecal retention.   Discussed the use of AI scribe software for clinical note transcription with the patient, who gave verbal consent to proceed.   History of Present Illness   Amanda Mcintosh is a 54 year old female with a history of polyps and diabetes who presents with gastrointestinal symptoms including constipation and bloating.   She experiences alternating episodes of constipation and diarrhea, with severe episodes of cramping, sweating, and fainting followed by a large bowel movement. These episodes occur once or twice a year and leave her feeling drained. In between these episodes, she experiences more frequent constipation, with stools that are usually soft but have been harder in the last three weeks, which she  attributes to medication. She typically has a bowel movement daily, sometimes more than once, but  this has slowed in the past five months.   She has a history of gastrointestinal issues, including a gallbladder removal in 2006-2007, and has experienced bloating and discomfort since then. She describes a sensation of food sitting in her stomach, leading to burping and tasting food. She has been on pantoprazole  40 mg as needed since an endoscopy in 2016 showed mild esophagitis. She uses it before consuming foods that might trigger acid reflux.   She reports being diagnosed with diabetes and starting Mounjaro  in April 2025, with the medication prescribed around April 20, 2024. She also takes medication for kidney protection, cholesterol, and high blood pressure. She recently completed a course of antibiotics for a sinus infection.   No family history of colon cancer and has had polyps in the past, but her last colonoscopy in 2021 did not show any polyps. No history of heart or lung disease and recently had normal evaluations from her cardiologist and pulmonologist.   She experiences bloating and gas with every meal and reports no history of black or bloody stools. She does not consume alcohol, smoke, or use drugs, and she quit smoking four years ago. She experiences frequent urination and has been referred to a urologist for evaluation.       She  reports that she quit smoking about 3 years ago. Her smoking use included cigarettes. She has been exposed to tobacco smoke. She has never used smokeless tobacco. She reports that she does not drink alcohol and does not use drugs.   RELEVANT GI HISTORY, IMAGING AND LABS: Results   Colonoscopy: No polyps (09/2020) Endoscopy: Mild esophagitis; small intestine abnormality suggestive of celiac disease, genetic test negative (2016)       CBC Labs (Brief)          Component Value Date/Time    WBC 9.3 04/20/2024 0916    RBC 4.57 04/20/2024 0916    HGB 13.4 04/20/2024 0916    HCT 40.6 04/20/2024 0916    PLT 396.0 04/20/2024 0916    MCV 88.8 04/20/2024  0916    MCH 30.0 04/03/2022 1240    MCHC 33.0 04/20/2024 0916    RDW 12.9 04/20/2024 0916    LYMPHSABS 3.3 04/20/2024 0916    MONOABS 0.6 04/20/2024 0916    EOSABS 0.1 04/20/2024 0916    BASOSABS 0.0 04/20/2024 0916      Recent Labs (within last 365 days)       Recent Labs    02/13/24 1355 02/26/24 1508 04/20/24 0916  HGB 13.6 13.3 13.4        CMP     Labs (Brief)          Component Value Date/Time    NA 141 04/20/2024 0916    NA 141 10/10/2022 1131    K 3.9 04/20/2024 0916    CL 104 04/20/2024 0916    CO2 28 04/20/2024 0916    GLUCOSE 130 (H) 04/20/2024 0916    BUN 18 04/20/2024 0916    BUN 14 10/10/2022 1131    CREATININE 1.04 04/20/2024 0916    CALCIUM  9.4 04/20/2024 0916    PROT 7.0 04/20/2024 0916    PROT 7.1 10/10/2022 1131    ALBUMIN 4.1 04/20/2024 0916    ALBUMIN 4.2 10/10/2022 1131    AST 15 04/20/2024 0916    ALT 22 04/20/2024 0916    ALKPHOS 66 04/20/2024 0916    BILITOT  0.5 04/20/2024 0916    BILITOT 0.4 10/10/2022 1131    GFRNONAA >60 04/03/2022 1240    GFRAA >60 02/03/2019 0013          Latest Ref Rng & Units 04/20/2024    9:16 AM 02/13/2024    1:55 PM 04/12/2023    1:51 PM  Hepatic Function  Total Protein 6.0 - 8.3 g/dL 7.0  6.8  6.8   Albumin 3.5 - 5.2 g/dL 4.1  4.1  4.1   AST 0 - 37 U/L 15  12  12    ALT 0 - 35 U/L 22  20  18    Alk Phosphatase 39 - 117 U/L 66  76  69   Total Bilirubin 0.2 - 1.2 mg/dL 0.5  0.3  0.6       Current Medications:    Current Outpatient Medications (Endocrine & Metabolic):    dapagliflozin  propanediol (FARXIGA ) 10 MG TABS tablet, Take 1 tablet (10 mg total) by mouth daily before breakfast.   metFORMIN  (GLUCOPHAGE -XR) 500 MG 24 hr tablet, Take 1 tablet (500 mg total) by mouth daily with breakfast.   tirzepatide  (MOUNJARO ) 5 MG/0.5ML Pen, Inject 5 mg into the skin once a week.     Current Outpatient Medications (Cardiovascular):    ezetimibe  (ZETIA ) 10 MG tablet, Take 1 tablet (10 mg total) by mouth daily.    valsartan -hydrochlorothiazide  (DIOVAN -HCT) 160-25 MG tablet, Take 1 tablet by mouth daily.     Current Outpatient Medications (Respiratory):    albuterol  (VENTOLIN  HFA) 108 (90 Base) MCG/ACT inhaler, Inhale 2 puffs into the lungs every 6 (six) hours as needed for wheezing or shortness of breath.   fluticasone  (FLONASE ) 50 MCG/ACT nasal spray, Place 1 spray into both nostrils in the morning and at bedtime. (Patient taking differently: Place 1 spray into both nostrils as needed.)             Current Outpatient Medications (Other):    ACCU-CHEK GUIDE TEST test strip, USE AS DIRECTED IN THE MORNING, AT NOON, AND AT BEDTIME.   AMBULATORY NON FORMULARY MEDICATION, Compression knee sleeve for pre-patellar bursitis Dispense 1 pre-patellar bursitis Use as needed   Blood Glucose Monitoring Suppl DEVI, 1 each by Does not apply route in the morning, at noon, and at bedtime. May substitute to any manufacturer covered by patient's insurance.   Blood Pressure Monitoring (OMRON 3 SERIES BP MONITOR) DEVI, Use to check blood pressure daily.   ciclopirox  (PENLAC ) 8 % solution, Apply topically at bedtime. Apply over nail and surrounding skin. Apply daily over previous coat. After seven (7) days, may remove with alcohol and continue cycle.   Continuous Glucose Receiver (DEXCOM G7 RECEIVER) DEVI, To check for blood sugar   Continuous Glucose Sensor (DEXCOM G7 SENSOR) MISC, Pt to use to check sugar level every   diclofenac  Sodium (VOLTAREN ) 1 % GEL, Apply 4 g topically 4 (four) times daily. To affected joint.   hydrOXYzine  (VISTARIL ) 25 MG capsule, TAKE 1 CAPSULE (25 MG TOTAL) BY MOUTH EVERY 8 (EIGHT) HOURS AS NEEDED FOR ANXIETY.   Vitamin D , Ergocalciferol , (DRISDOL ) 1.25 MG (50000 UNIT) CAPS capsule, Take 1 capsule (50,000 Units total) by mouth every 7 (seven) days for 12 doses.   pantoprazole  (PROTONIX ) 40 MG tablet, Take 1 tablet (40 mg total) by mouth daily.   Current Facility-Administered Medications  (Other):    diclofenac  Sodium (VOLTAREN ) 1 % topical gel 4 g   Medical History:      Past Medical History:  Diagnosis Date  Diabetes mellitus without complication (HCC)     Fibroids     Heartburn     History of headache     Hyperlipidemia     Hypertension     Obesity     Thyroid  condition     Vitamin D  deficiency          Allergies:  Allergies       Allergies  Allergen Reactions   Crestor  [Rosuvastatin  Calcium ] Other (See Comments)      Severe myalgia   Gadolinium Derivatives Other (See Comments)      PT STARTED SHAKING AND FELT VERY COLD INTERNALLY BUT FACE WAS VERY FLUSHED. THIS WAS A DELAYED REACTION (ABOUT 1 HOUR AFTER INJECTION). SHE TOOK BENADRYL  AND HYDRATED AND SYMPTOMS SUBSIDED        Surgical History:  She  has a past surgical history that includes Cholecystectomy (12/31/2004); Wisdom tooth extraction; Tonsillectomy; Robot assisted mtomectomy (12/31/2009); Stress Echocardiogram (08/2012); transthoracic echocardiogram (08/15/2022); and Pulmonary function tests (09/14/2022). Family History:  Her family history includes Anxiety disorder in her mother; Breast cancer in her sister; COPD in her father; Coronary artery disease (age of onset: 15) in her mother; Depression in her mother; Diabetes in her mother; Diabetes Mellitus II in her mother; HIV/AIDS in her brother; Heart attack (age of onset: 17) in her mother; Heart disease (age of onset: 68) in her brother; Heart failure in her mother; High Cholesterol in her father and mother; High blood pressure in her father; Hyperlipidemia in an other family member; Hypertension in her mother; Obesity in her mother; Obstructive Sleep Apnea in her mother; Skin cancer in her sister; Sleep apnea in her mother; Stroke in her mother.   REVIEW OF SYSTEMS  : All other systems reviewed and negative except where noted in the History of Present Illness.   PHYSICAL EXAM: BP 110/76   Pulse 76   Ht 5' 5 (1.651 m)   Wt 255 lb (115.7 kg)    LMP 02/28/2017   BMI 42.43 kg/m  Physical Exam   GENERAL APPEARANCE: Well nourished, in no apparent distress. HEENT: No cervical lymphadenopathy, unremarkable thyroid , sclerae anicteric, conjunctiva pink. RESPIRATORY: Respiratory effort normal, breath sounds clear and equal bilaterally without rales, rhonchi, or wheezing. CARDIO: RRR with no MRGs, peripheral pulses intact. ABDOMEN: Soft, non distended, active bowel sounds in all 4 quadrants, no tenderness to palpation, no rebound, no mass appreciated. RECTAL: Declines. MUSCULOSKELETAL: Full ROM, normal gait, without edema. SKIN: Dry, intact without rashes or lesions. No jaundice. NEURO: Alert, oriented, no focal deficits. PSYCH: Cooperative, normal mood and affect.       Alan JONELLE Coombs, PA-C   Attending physician's note   I have taken history, reviewed the chart and examined the patient. I performed a substantive portion of this encounter, including complete performance of at least one of the key components, in conjunction with the APP. I agree with the Advanced Practitioner's note, impression and recommendations.   For EGD today On protonix  40 every day. CT scheduled for 8/5   Anselm Bring, MD [facility] [phone]

## 2024-07-31 NOTE — Progress Notes (Signed)
 1416  Pt experienced laryngeal spasm with jaw thrust  performed. vss

## 2024-07-31 NOTE — Patient Instructions (Signed)
 Resume previous diet  Take Protonix  40mg  daily X 4 weeks, then use as needed  Proceed with CT abdomen/pelvis  Await pathology results Avoid NSAIDs ( ibuprofen, aleve, etc) See handout for gastritis  YOU HAD AN ENDOSCOPIC PROCEDURE TODAY AT THE [facility]:   Refer to the procedure report that was given to you for any specific questions about what was found during the examination.  If the procedure report does not answer your questions, please call your gastroenterologist to clarify.  If you requested that your care partner not be given the details of your procedure findings, then the procedure report has been included in a sealed envelope for you to review at your convenience later.  YOU SHOULD EXPECT: Some feelings of bloating in the abdomen. Passage of more gas than usual.  Walking can help get rid of the air that was put into your GI tract during the procedure and reduce the bloating. If you had a lower endoscopy (such as a colonoscopy or flexible sigmoidoscopy) you may notice spotting of blood in your stool or on the toilet paper. If you underwent a bowel prep for your procedure, you may not have a normal bowel movement for a few days.  Please Note:  You might notice some irritation and congestion in your nose or some drainage.  This is from the oxygen used during your procedure.  There is no need for concern and it should clear up in a day or so.  SYMPTOMS TO REPORT IMMEDIATELY: Following upper endoscopy (EGD)  Vomiting of blood or coffee ground material  New chest pain or pain under the shoulder blades  Painful or persistently difficult swallowing  New shortness of breath  Fever of 100F or higher  Black, tarry-looking stools  For urgent or emergent issues, a gastroenterologist can be reached at any hour by calling [phone]. Do not use MyChart messaging for urgent concerns.   DIET:  We do recommend a small meal at first, but then you may proceed to your regular diet.   Drink plenty of fluids but you should avoid alcoholic beverages for 24 hours.  ACTIVITY:  You should plan to take it easy for the rest of today and you should NOT DRIVE or use heavy machinery until tomorrow (because of the sedation medicines used during the test).    FOLLOW UP: Our staff will call the number listed on your records the next business day following your procedure.  We will call around 7:15- 8:00 am to check on you and address any questions or concerns that you may have regarding the information given to you following your procedure. If we do not reach you, we will leave a message.     If any biopsies were taken you will be contacted by phone or by letter within the next 1-3 weeks.  Please call us  at [phone] if you have not heard about the biopsies in 3 weeks.   SIGNATURES/CONFIDENTIALITY: You and/or your care partner have signed paperwork which will be entered into your electronic medical record.  These signatures attest to the fact that that the information above on your After Visit Summary has been reviewed and is understood.  Full responsibility of the confidentiality of this discharge information lies with you and/or your care-partner.

## 2024-07-31 NOTE — Op Note (Signed)
 [facility] Patient Name: Amanda Mcintosh Procedure Date: 07/31/2024 2:05 PM MRN: [id number] Endoscopist: Lynnie Bring , MD, [id number] Age: 53 Referring MD:  Date of Birth: 03-15-1971 Gender: Female Account #: [id number] Procedure:                Upper GI endoscopy Indications:              Postprandial epigastric abdominal pain. S/P lap                            chole in past Medicines:                Monitored Anesthesia Care Procedure:                Pre-Anesthesia Assessment:                           - Prior to the procedure, a History and Physical                            was performed, and patient medications and                            allergies were reviewed. The patient's tolerance of                            previous anesthesia was also reviewed. The risks                            and benefits of the procedure and the sedation                            options and risks were discussed with the patient.                            All questions were answered, and informed consent                            was obtained. Prior Anticoagulants: The patient has                            taken no anticoagulant or antiplatelet agents. ASA                            Grade Assessment: II - A patient with mild systemic                            disease. After reviewing the risks and benefits,                            the patient was deemed in satisfactory condition to                            undergo the procedure.  After obtaining informed consent, the endoscope was                            passed under direct vision. Throughout the                            procedure, the patient's blood pressure, pulse, and                            oxygen saturations were monitored continuously. The                            GIF [id number] #[id number] was introduced through the                            mouth, and advanced to the second part of  duodenum.                            The upper GI endoscopy was accomplished without                            difficulty. The patient tolerated the procedure                            well. Scope In: Scope Out: Findings:                 The examined esophagus was normal.                           The Z-line was regular and was found 38 cm from the                            incisors. Examined by NBI.                           Localized mild inflammation characterized by                            congestion (edema), erythema and friability was                            found in the gastric body and in the gastric                            antrum. Biopsies were taken with a cold forceps for                            histology.                           The examined duodenum was normal. Biopsies for                            histology were taken with a cold forceps for  evaluation of celiac disease. Complications:            No immediate complications. Estimated Blood Loss:     Estimated blood loss: none. Impression:               - Mild gastritis. Recommendation:           - Patient has a contact number available for                            emergencies. The signs and symptoms of potential                            delayed complications were discussed with the                            patient. Return to normal activities tomorrow.                            Written discharge instructions were provided to the                            patient.                           - Resume previous diet.                           - Please take Protonix  40 mg p.o. daily x 4 weeks,                            then use as needed.                           - Proceed with CT Abdo/pelvis as scheduled for next                            week                           - If still with problems, solid-phase gastric                            emptying scan.                            - Await pathology results.                           - Avoid nonsteroidals.                           - The findings and recommendations were discussed                            with the patient's family.                           -  Return to GI clinic with Alan Coombs PA if                            continued problems. Lynnie Bring, MD 07/31/2024 2:23:09 PM This report has been signed electronically.

## 2024-07-31 NOTE — Progress Notes (Signed)
 Called to room to assist during endoscopic procedure.  Patient ID and intended procedure confirmed with present staff. Received instructions for my participation in the procedure from the performing physician.

## 2024-07-31 NOTE — Progress Notes (Signed)
1407 Robinul 0.1 mg IV given due large amount of secretions upon assessment.  MD made aware, vss  ?

## 2024-08-03 ENCOUNTER — Other Ambulatory Visit: Payer: Self-pay | Admitting: Emergency Medicine

## 2024-08-03 ENCOUNTER — Telehealth: Payer: Self-pay

## 2024-08-03 DIAGNOSIS — E1169 Type 2 diabetes mellitus with other specified complication: Secondary | ICD-10-CM

## 2024-08-03 NOTE — Telephone Encounter (Signed)
  Follow up Call-     07/31/2024    1:21 PM  Call back number  Post procedure Call Back phone  # [phone]  Permission to leave phone message Yes     Patient questions:  Do you have a fever, pain , or abdominal swelling? No. Pain Score  0 *  Have you tolerated food without any problems? Yes.    Have you been able to return to your normal activities? Yes.    Do you have any questions about your discharge instructions: Diet   No. Medications  No. Follow up visit  No.  Do you have questions or concerns about your Care? No.  Actions: * If pain score is 4 or above: No action needed, pain <4.

## 2024-08-03 NOTE — Telephone Encounter (Signed)
 Error

## 2024-08-04 ENCOUNTER — Encounter (HOSPITAL_COMMUNITY)
Admission: RE | Admit: 2024-08-04 | Discharge: 2024-08-04 | Disposition: A | Discharge status: Home / Self Care | Source: Ambulatory Visit | Attending: Physician Assistant | Admitting: Physician Assistant

## 2024-08-04 ENCOUNTER — Ambulatory Visit (HOSPITAL_COMMUNITY)

## 2024-08-04 DIAGNOSIS — R1032 Left lower quadrant pain: Secondary | ICD-10-CM | POA: Diagnosis present

## 2024-08-04 DIAGNOSIS — K219 Gastro-esophageal reflux disease without esophagitis: Secondary | ICD-10-CM | POA: Diagnosis present

## 2024-08-04 DIAGNOSIS — K581 Irritable bowel syndrome with constipation: Secondary | ICD-10-CM | POA: Insufficient documentation

## 2024-08-04 MED ORDER — IOHEXOL 300 MG/ML  SOLN
100.0000 mL | Freq: Once | INTRAMUSCULAR | Status: AC | PRN
  Administered 2024-08-04: 100 mL via INTRAVENOUS

## 2024-08-05 LAB — SURGICAL PATHOLOGY

## 2024-08-10 LAB — HM MAMMOGRAPHY

## 2024-08-18 ENCOUNTER — Ambulatory Visit: Payer: Self-pay | Admitting: Gastroenterology

## 2024-09-21 NOTE — Progress Notes (Unsigned)
 PATIENT: Amanda Mcintosh DOB: 1971/09/20  REASON FOR VISIT: follow up HISTORY FROM: patient  No chief complaint on file.    HISTORY OF PRESENT ILLNESS:  09/21/24 ALL:  Amanda Mcintosh returns for follow up for OSA on CPAP. She is doing well on therapy. She is using CPAP nightly for about   She is eligible for a new machine.    09/16/2023 ALL:  She returns for follow up for OSA on CPAP. She continues to do well. She is using CPAP most every day for about 7-8 hours. She denies concerns with machine or supplies. She recently started a new job and back to work full time. She is walking a mile, every day.     09/13/2022 ALL: She returns for follow up for OSA on CPAP. She continues to do well. She has adjusted to therapy and using every night. She denies concerns with machine or supplies. She was laid off from her job of 15 years. She is planning to look for a new role next year. Husband has prostate cancer.     03/28/2021 ALL: She returns for follow up for OSA on CPAP. She has continued fairly consistent use. She does not take it with her if she goes out of town. She spent 2 weeks with her daughter and did not take it with her. She feels that she sleeps better without CPAP but recognizes health benefits of using it. She has less headaches and does not wake feeling congested. She has recently started smoking. She has moved into a rental home that she doesn't love and her mother passed away. She is under more stress. She has a plan in place to stop smoking and increase exercise. She would like to lose weight.     09/28/2020 ALL:  Amanda Mcintosh is a 53 y.o. female here today for follow up for OSA on CPAP. She is doing well. She admits there are days when she doesn't use CPAP. She went out of town this past weekend and did not use her machine. She does note benefit of using CPAP.  She wakes feeling well rested.   Compliance report dated 06/25/2020 through 09/22/2020 reveals that she used  CPAP 69 of the past 90 days for compliance of 77%.  She used CPAP greater than 4 hours 64 of the past 90 days for compliance of 71%.  Average usage was 6 hours and 55 minutes.  Residual AHI was 0.6 on 7 to 14 cm of water and an EPR of three.  There was no significant leak noted.  HISTORY: (copied from my note on 06/27/2020)  Amanda Mcintosh is a 53 y.o. female here today for follow up for OSA on CPAP. She admits that she has not been compliant with CPAP usage recently. She has gone on several vacations and not taken her machine. She knows that she needs to use it. No other concerns. She has recently quit smoking. She is working on [employer].    Compliance report dated 03/17/2020 through 06/14/2020 reveals that she used CPAP 26 of the last 90 days for compliance of 29%.  She is CPAP greater than 4 hours 23 of the past 90 days for compliance of 26%.  Average usage on days used was 6 hours and 2 minutes.  Residual AHI was 0.7 on 7 to 14 cm of water and an EPR of 3.  There was no significant leak noted.   HISTORY: (copied from my note on 06/24/2019)  Amanda Mcintosh is a 53 y.o. female here today for follow up of OSA on CPAP.  She reports doing very well with CPAP therapy.  She has noted that she is grinding her teeth less.  Compliance download dated 05/25/2019 through 06/23/2019 reveals that she is using her machine 28 out of the last 30 days for compliance of 93%.  28 days were used for greater than 4 hours for compliance of 93%.  Average usage was 6 hours and 1 minute.  AHI was 0.5 on 7 to 14 cm of water and an EPR level of 3.  There was no significant leak.  She returns today for evaluation.     History (copied from Dr Obie note on 03/03/2019)   Dear Amanda Mcintosh,    I saw your patient, Amanda Mcintosh, upon your kind request in my sleep clinic today for initial consultation of her sleep disorder, in particular, concern for underlying obstructive sleep apnea. The patient is unaccompanied  today. As you know, Amanda Mcintosh is a 53 year old right-handed woman with an underlying medical history of reflux disease, vitamin D  deficiency, smoking, headaches, dizziness, seasonal allergies, chronic sinusitis, and morbid obesity with a BMI of over 40, who reports snoring and excessive daytime somnolence as well as witnessed apneas per family's report. I reviewed your office note from 01/28/2019. She had a home sleep test about 4-1/2 years ago and I reviewed the results. Test date was 09/14/2014, overall AHI was 9.2 per hour, O2 nadir was 81%. She weighed 231 pounds at the time, BMI of 38. Her Epworth sleepiness score is 7 out of 24 today, fatigue score is 18 out of 63. She's not sleeping well. She reports a bedtime of 9 PM, some difficulty falling asleep. She does turn her bedroom TV off at 9 PM. She has a rise time of 6 AM. No night to night nocturia, occasional AM HAs, does have nasal congestion. Mom had OSA.  She had T/A as a child, no asthma. See Dr. Arlana for sinus d/s.  She lives with her husband, she has 1 child. She works for [employer]. She smokes one pack per day, does not utilize alcohol typically and does not drink caffeine on a regular basis.  She is fasting from her church, has an appointment for FU in wt management soon. She has seen an endocrinologist. She has seen an integrative health provider. She has had stress, lost her mom 2 years ago. Recently lost a friend.   REVIEW OF SYSTEMS: Out of a complete 14 system review of symptoms, the patient complains only of the following symptoms, none and all other reviewed systems are negative.  ESS: 3/24, previously 2 FSS:30  ALLERGIES: Allergies  Allergen Reactions   Crestor  [Rosuvastatin  Calcium ] Other (See Comments)    Severe myalgia   Gadolinium Derivatives Other (See Comments)    PT STARTED SHAKING AND FELT VERY COLD INTERNALLY BUT FACE WAS VERY FLUSHED. THIS WAS A DELAYED REACTION (ABOUT 1 HOUR AFTER INJECTION). SHE TOOK BENADRYL  AND  HYDRATED AND SYMPTOMS SUBSIDED    HOME MEDICATIONS: Outpatient Medications Prior to Visit  Medication Sig Dispense Refill   ACCU-CHEK GUIDE TEST test strip USE AS DIRECTED IN THE MORNING, AT NOON, AND AT BEDTIME. 100 strip 0   albuterol  (VENTOLIN  HFA) 108 (90 Base) MCG/ACT inhaler Inhale 2 puffs into the lungs every 6 (six) hours as needed for wheezing or shortness of breath. 1 each 11   AMBULATORY NON FORMULARY MEDICATION Compression knee sleeve for pre-patellar bursitis Dispense  1 pre-patellar bursitis Use as needed 1 Units 0   Blood Glucose Monitoring Suppl DEVI 1 each by Does not apply route in the morning, at noon, and at bedtime. May substitute to any manufacturer covered by patient's insurance. 1 each 0   Blood Pressure Monitoring (OMRON 3 SERIES BP MONITOR) DEVI Use to check blood pressure daily. 1 each 0   ciclopirox  (PENLAC ) 8 % solution Apply topically at bedtime. Apply over nail and surrounding skin. Apply daily over previous coat. After seven (7) days, may remove with alcohol and continue cycle. (Patient not taking: Reported on 07/31/2024) 6.6 mL 0   Continuous Glucose Receiver (DEXCOM G7 RECEIVER) DEVI To check for blood sugar 1 each 0   Continuous Glucose Sensor (DEXCOM G7 SENSOR) MISC Pt to use to check sugar level every 14 each 8   dapagliflozin  propanediol (FARXIGA ) 10 MG TABS tablet Take 1 tablet (10 mg total) by mouth daily before breakfast. 90 tablet 3   diclofenac  Sodium (VOLTAREN ) 1 % GEL Apply 4 g topically 4 (four) times daily. To affected joint. 100 g 11   ezetimibe  (ZETIA ) 10 MG tablet Take 1 tablet (10 mg total) by mouth daily. 90 tablet 3   fluticasone  (FLONASE ) 50 MCG/ACT nasal spray Place 1 spray into both nostrils in the morning and at bedtime. (Patient taking differently: Place 1 spray into both nostrils as needed.) 9.9 mL 2   hydrOXYzine  (VISTARIL ) 25 MG capsule TAKE 1 CAPSULE (25 MG TOTAL) BY MOUTH EVERY 8 (EIGHT) HOURS AS NEEDED FOR ANXIETY. 30 capsule 0    metFORMIN  (GLUCOPHAGE -XR) 500 MG 24 hr tablet Take 1 tablet (500 mg total) by mouth daily with breakfast. 90 tablet 0   pantoprazole  (PROTONIX ) 40 MG tablet Take 1 tablet (40 mg total) by mouth daily. 90 tablet 1   potassium chloride  SA (KLOR-CON  M) 20 MEQ tablet Take 1 tablet (20 mEq total) by mouth once for 1 dose. 3 tablet 0   tirzepatide  (MOUNJARO ) 5 MG/0.5ML Pen INJECT 5 MG SUBCUTANEOUSLY WEEKLY 2 mL 3   valsartan -hydrochlorothiazide  (DIOVAN -HCT) 160-25 MG tablet Take 1 tablet by mouth daily. 90 tablet 0   Vitamin D , Ergocalciferol , (DRISDOL ) 1.25 MG (50000 UNIT) CAPS capsule Take 1 capsule (50,000 Units total) by mouth every 7 (seven) days for 12 doses. 12 capsule 0   Facility-Administered Medications Prior to Visit  Medication Dose Route Frequency Provider Last Rate Last Admin   diclofenac  Sodium (VOLTAREN ) 1 % topical gel 4 g  4 g Topical QID Corey, Evan S, MD        PAST MEDICAL HISTORY: Past Medical History:  Diagnosis Date   Anxiety    Arthritis    Diabetes mellitus without complication (HCC)    Fibroids    GERD (gastroesophageal reflux disease)    Heartburn    History of headache    Hyperlipidemia    Hypertension    Obesity    Sleep apnea    Thyroid  condition    Vitamin D  deficiency     PAST SURGICAL HISTORY: Past Surgical History:  Procedure Laterality Date   CHOLECYSTECTOMY  12/31/2004   Pulmonary function tests  09/14/2022   Normal lung volumes and DLCO.  Normal spirometry.   ROBOT ASSISTED MYOMECTOMY  12/31/2009   Stress Echocardiogram  08/2012   Exercise to max HR 164 bpm - 93% MPHR 177 bpm.  Normal BP and HR response.  No electrical or echocardiographic evidence of exercise-induced ischemia.  Normal EF, baseline > 55% with no RWMA.  Peak  stress LVEF increased to 65-70% with no RWMA.  LOW RISK.   TONSILLECTOMY     TRANSTHORACIC ECHOCARDIOGRAM  08/15/2022   NORMAL.  EF 55 to 60%.  No RWMA.  Normal diastolic parameters.  Unable to assess PAP and otherwise  normal RV.  Normal AOV and MV.  Mildly elevated RAP.   WISDOM TOOTH EXTRACTION      FAMILY HISTORY: Family History  Problem Relation Age of Onset   Diabetes Mother    Hypertension Mother    Heart failure Mother    Stroke Mother    High Cholesterol Mother    Depression Mother    Anxiety disorder Mother    Sleep apnea Mother    Obesity Mother    Obstructive Sleep Apnea Mother    Coronary artery disease Mother 54       s/p PCI   Diabetes Mellitus II Mother    Heart attack Mother 73   COPD Father        smoker   High blood pressure Father    High Cholesterol Father    Breast cancer Sister    Skin cancer Sister    HIV/AIDS Brother    Heart disease Brother 41       enlarged heart = presented with Acute Resp Failure   Hyperlipidemia Other    Migraines Neg Hx    Esophageal cancer Neg Hx    Stomach cancer Neg Hx    Rectal cancer Neg Hx     SOCIAL HISTORY: Social History   Socioeconomic History   Marital status: Legally Separated    Spouse name: Darryl   Number of children: 1   Years of education: Not on file   Highest education level: Some college, no degree  Occupational History   Occupation: Customer service manager: [employer]  Tobacco Use   Smoking status: Former    Current packs/day: 0.00    Types: Cigarettes    Quit date: 02/27/2021    Years since quitting: 3.5    Passive exposure: Past   Smokeless tobacco: Never   Tobacco comments:    Quit in Feb 27 2022. Tay 07/26/22  Vaping Use   Vaping status: Never Used  Substance and Sexual Activity   Alcohol use: No   Drug use: No   Sexual activity: Not on file  Other Topics Concern   Not on file  Social History Narrative   Not on file   Social Drivers of Health   Financial Resource Strain: Low Risk  (07/19/2024)   Overall Financial Resource Strain (CARDIA)    Difficulty of Paying Living Expenses: Not very hard  Food Insecurity: Patient Declined (07/19/2024)   Hunger Vital Sign    Worried  About Running Out of Food in the Last Year: Patient declined    Ran Out of Food in the Last Year: Patient declined  Transportation Needs: No Transportation Needs (07/19/2024)   PRAPARE - Administrator, Civil Service (Medical): No    Lack of Transportation (Non-Medical): No  Physical Activity: Insufficiently Active (07/19/2024)   Exercise Vital Sign    Days of Exercise per Week: 3 days    Minutes of Exercise per Session: 10 min  Stress: Stress Concern Present (07/19/2024)   [employer] of Occupational Health - Occupational Stress Questionnaire    Feeling of Stress: To some extent  Social Connections: Socially Integrated (07/19/2024)   Social Connection and Isolation Panel    Frequency of Communication with Friends  and Family: Three times a week    Frequency of Social Gatherings with Friends and Family: Once a week    Attends Religious Services: More than 4 times per year    Active Member of [employer] or Organizations: Yes    Attends Engineer, structural: More than 4 times per year    Marital Status: Married  Catering manager Violence: Not At Risk (04/30/2023)   Received from [facility]   HITS    Over the last 12 months how often did your partner physically hurt you?: Never    Over the last 12 months how often did your partner insult you or talk down to you?: Sometimes    Over the last 12 months how often did your partner threaten you with physical harm?: Never    Over the last 12 months how often did your partner scream or curse at you?: Sometimes      PHYSICAL EXAM  There were no vitals filed for this visit.    There is no height or weight on file to calculate BMI.  Generalized: Well developed, in no acute distress  Neurological examination  Mentation: Alert oriented to time, place, history taking. Follows all commands speech and language fluent Cranial nerve II-XII: Pupils were equal round reactive to light. Extraocular movements were full, visual  field were full  Motor: The motor testing reveals 5 over 5 strength of all 4 extremities. Good symmetric motor tone is noted throughout.  Gait and station: Gait is normal.    DIAGNOSTIC DATA (LABS, IMAGING, TESTING) - I reviewed patient records, labs, notes, testing and imaging myself where available.      No data to display           Lab Results  Component Value Date   WBC 10.2 07/29/2024   HGB 13.3 07/29/2024   HCT 40.4 07/29/2024   MCV 85.6 07/29/2024   PLT 409.0 (H) 07/29/2024      Component Value Date/Time   NA 137 07/29/2024 1439   NA 141 10/10/2022 1131   K 3.1 (L) 07/29/2024 1439   CL 100 07/29/2024 1439   CO2 29 07/29/2024 1439   GLUCOSE 110 (H) 07/29/2024 1439   BUN 16 07/29/2024 1439   BUN 14 10/10/2022 1131   CREATININE 0.89 07/29/2024 1439   CALCIUM  9.3 07/29/2024 1439   PROT 7.3 07/29/2024 1439   PROT 7.1 10/10/2022 1131   ALBUMIN 4.2 07/29/2024 1439   ALBUMIN 4.2 10/10/2022 1131   AST 12 07/29/2024 1439   ALT 18 07/29/2024 1439   ALKPHOS 72 07/29/2024 1439   BILITOT 0.5 07/29/2024 1439   BILITOT 0.4 10/10/2022 1131   GFRNONAA >60 04/03/2022 1240   GFRAA >60 02/03/2019 0013   Lab Results  Component Value Date   CHOL 213 (H) 04/20/2024   HDL 37.10 (L) 04/20/2024   LDLCALC 144 (H) 04/20/2024   LDLDIRECT 139.0 09/06/2016   TRIG 162.0 (H) 04/20/2024   CHOLHDL 6 04/20/2024   Lab Results  Component Value Date   HGBA1C 5.1 07/21/2024   Lab Results  Component Value Date   VITAMINB12 >1537 (H) 04/20/2024   Lab Results  Component Value Date   TSH 0.96 04/12/2023       ASSESSMENT AND PLAN 53 y.o. year old female  has a past medical history of Anxiety, Arthritis, Diabetes mellitus without complication (HCC), Fibroids, GERD (gastroesophageal reflux disease), Heartburn, History of headache, Hyperlipidemia, Hypertension, Obesity, Sleep apnea, Thyroid  condition, and Vitamin D  deficiency. here with  No diagnosis found.   Mikhaela is doing  well. Compliance report shows excellent compliance. She does recognize health benefits of using CPAP. She was encouraged to continue using nightly for at last 4 hours. She will continue working on healthy lifestyle habits. She will continue follow up with PCP. Follow up with me in 1 year.    No orders of the defined types were placed in this encounter.    No orders of the defined types were placed in this encounter.      Greig Forbes, FNP-C 09/21/2024, 9:39 AM [facility] [address], [address] [place], [place] [address] [phone]

## 2024-09-21 NOTE — Progress Notes (Unsigned)
 SABRA

## 2024-09-21 NOTE — Patient Instructions (Signed)

## 2024-09-22 ENCOUNTER — Ambulatory Visit (INDEPENDENT_AMBULATORY_CARE_PROVIDER_SITE_OTHER): Payer: Commercial Managed Care - PPO | Admitting: Family Medicine

## 2024-09-22 ENCOUNTER — Encounter: Payer: Self-pay | Admitting: Family Medicine

## 2024-09-22 VITALS — BP 121/73 | HR 61 | Ht 65.0 in | Wt 252.6 lb

## 2024-09-22 DIAGNOSIS — G4733 Obstructive sleep apnea (adult) (pediatric): Secondary | ICD-10-CM | POA: Diagnosis not present

## 2024-10-08 LAB — OPHTHALMOLOGY REPORT-SCANNED

## 2024-10-19 ENCOUNTER — Encounter: Payer: Self-pay | Admitting: Physical Therapy

## 2024-10-19 ENCOUNTER — Other Ambulatory Visit: Payer: Self-pay

## 2024-10-19 ENCOUNTER — Ambulatory Visit: Attending: Emergency Medicine | Admitting: Physical Therapy

## 2024-10-19 DIAGNOSIS — M62838 Other muscle spasm: Secondary | ICD-10-CM | POA: Insufficient documentation

## 2024-10-19 DIAGNOSIS — R293 Abnormal posture: Secondary | ICD-10-CM | POA: Diagnosis present

## 2024-10-19 DIAGNOSIS — R279 Unspecified lack of coordination: Secondary | ICD-10-CM | POA: Insufficient documentation

## 2024-10-19 DIAGNOSIS — M6281 Muscle weakness (generalized): Secondary | ICD-10-CM | POA: Diagnosis present

## 2024-10-19 NOTE — Therapy (Signed)
 OUTPATIENT PHYSICAL THERAPY FEMALE PELVIC EVALUATION   Patient Name: Amanda Mcintosh MRN: [id number] DOB:Oct 09, 1971, 53 y.o., female Today's Date: 10/19/2024  END OF SESSION:  PT End of Session - 10/19/24 1619     Visit Number 1    Date for Recertification  04/19/25    Authorization Type [employer] [employer] [place]    PT Start Time 1616    PT Stop Time 1650    PT Time Calculation (min) 34 min    Activity Tolerance Patient tolerated treatment well    Behavior During Therapy WFL for tasks assessed/performed          Past Medical History:  Diagnosis Date   Anxiety    Arthritis    Diabetes mellitus without complication (HCC)    Fibroids    GERD (gastroesophageal reflux disease)    Heartburn    History of headache    Hyperlipidemia    Hypertension    Obesity    Sleep apnea    Thyroid  condition    Vitamin D  deficiency    Past Surgical History:  Procedure Laterality Date   CHOLECYSTECTOMY  12/31/2004   Pulmonary function tests  09/14/2022   Normal lung volumes and DLCO.  Normal spirometry.   ROBOT ASSISTED MYOMECTOMY  12/31/2009   Stress Echocardiogram  08/2012   Exercise to max HR 164 bpm - 93% MPHR 177 bpm.  Normal BP and HR response.  No electrical or echocardiographic evidence of exercise-induced ischemia.  Normal EF, baseline > 55% with no RWMA.  Peak stress LVEF increased to 65-70% with no RWMA.  LOW RISK.   TONSILLECTOMY     TRANSTHORACIC ECHOCARDIOGRAM  08/15/2022   NORMAL.  EF 55 to 60%.  No RWMA.  Normal diastolic parameters.  Unable to assess PAP and otherwise normal RV.  Normal AOV and MV.  Mildly elevated RAP.   WISDOM TOOTH EXTRACTION     Patient Active Problem List   Diagnosis Date Noted   Urinary incontinence 07/21/2024   Generalized anxiety disorder 04/20/2024   Dyslipidemia associated with type 2 diabetes mellitus (HCC) 04/20/2024   Dermatofibroma of left lower leg 06/27/2023   Other hyperlipidemia 10/17/2022   Vitamin D   deficiency 08/28/2022   Insulin  resistance 08/28/2022   Depression 08/28/2022   Hypertension associated with diabetes (HCC) 07/27/2022   Family history of CHF (congestive heart failure) 07/27/2022   DOE (dyspnea on exertion) 07/27/2022   Hyperlipidemia due to dietary fat intake 07/27/2022   Metabolic syndrome 07/27/2022   OSA on CPAP 06/24/2019   Chronic seasonal allergic rhinitis 01/25/2017   Bacterial sinusitis 01/25/2017   Amenorrhea 06/28/2015   GERD (gastroesophageal reflux disease) 04/21/2015   Iron deficiency anemia 12/02/2013   Migraine headache without aura 03/11/2013   Family history of early CAD 09/08/2012    PCP: [facility] Emil Schanz, MD   REFERRING PROVIDER: Craig Alan SAUNDERS, PA-C  REFERRING DIAG: X41.8 (ICD-10-CM) - Irritable bowel syndrome with constipation K21.9 (ICD-10-CM) - Gastroesophageal reflux disease, unspecified whether esophagitis present  THERAPY DIAG:  Muscle weakness (generalized)  Other muscle spasm  Abnormal posture  Unspecified lack of coordination  Rationale for Evaluation and Treatment: Rehabilitation  ONSET DATE: constipation since july   SUBJECTIVE:  SUBJECTIVE STATEMENT: Has urinary incontinence (coughing, sneezing, laughing, jumping, bending over) and increased frequency, does have constipation but never had issues before taking Mounjaro  (started in June)   Fluid intake: water - ~70 oz; no soda, minimal tea  FUNCTIONAL LIMITATIONS: just has to plan all activities due to having to go to the bathroom often  PERTINENT HISTORY:  Medications for current condition: nothing related to symptoms  Surgeries: fibroids removed, gallbladder removed,  Other: postmenopausal  Sexual abuse: No  DIAGNOSTIC FINDINGS:  Post-void residual: Voiding  Cystourethrogram (VCUG):  Ultrasound: PAIN:  Are you having pain? No   PRECAUTIONS: None  RED FLAGS: None   WEIGHT BEARING RESTRICTIONS: No  FALLS:  Has patient fallen in last 6 months? No  OCCUPATION: remote  ACTIVITY LEVEL : low  PLOF: Independent  PATIENT GOALS: to have less frequency of urine and more regular bowels   BOWEL MOVEMENT: Pain with bowel movement: No Type of bowel movement:Type (Bristol Stool Scale) 3-4, Frequency every other day, and Strain yes Fully empty rectum: No Leakage: No                                                     Pads: No Fiber supplement/laxative just bought miralax but hasn't taken yet  URINATION: Pain with urination: No Fully empty bladder: Nonot always sometimes feels like she needs to double void                                Post-void dribble: No Stream: Strong and Weak Urgency: Yes  Frequency:during the day sometimes back to back, sometimes if working will be a couple hours                                                         Nocturia: No   Leakage: Coughing, Sneezing, Laughing, Exercise, and Lifting Pads/briefs: Yes: liner daily 2-3x daily  INTERCOURSE:  Ability to have vaginal penetration Yes   Pain with intercourse: Initial Penetration Dryness: Yes a little Climax: yes Marinoff Scale: 0/3 Lubricant:  PREGNANCY: Vaginal deliveries 1 Episiotomy Yes  C-section deliveries 0 Currently pregnant No  PROLAPSE: Heaviness at bladder    OBJECTIVE:  Note: Objective measures were completed at Evaluation unless otherwise noted.  DIAGNOSTIC FINDINGS:    PATIENT SURVEYS:    PFIQ-7: 82  COGNITION: Overall cognitive status: Within functional limits for tasks assessed     SENSATION: Light touch: Appears intact  FUNCTIONAL TESTS:   Single leg stance: sway and rotation at pelvis noted bil <4s  Sit-up test:1/3   GAIT: WFL  POSTURE: rounded shoulders, forward head, and posterior pelvic  tilt   LUMBARAROM/PROM:  A/PROM A/PROM  Eval (% available)  Flexion 75  Extension 100  Right lateral flexion 75  Left lateral flexion 75  Right rotation 75  Left rotation 75   (Blank rows = not tested)  LOWER EXTREMITY ROM:  Bil hamstrings and adductors limited by 25%  LOWER EXTREMITY MMT:  Bil hips grossly 4/5 PALPATION:  General: tightness in bil lumbar and thoracic paraspinals   Pelvic Alignment: WFL  Abdominal: no TTP but  mild tension throughout lower abdomen                   External Perineal Exam: dryness noted                             Internal Pelvic Floor: no TTP  Patient confirms identification and approves PT to assess internal pelvic floor and treatment Yes No emotional/communication barriers or cognitive limitation. Patient is motivated to learn. Patient understands and agrees with treatment goals and plan. PT explains patient will be examined in standing, sitting, and lying down to see how their muscles and joints work. When they are ready, they will be asked to remove their underwear so PT can examine their perineum. The patient is also given the option of providing their own chaperone as one is not provided in our facility. The patient also has the right and is explained the right to defer or refuse any part of the evaluation or treatment including the internal exam. With the patient's consent, PT will use one gloved finger to gently assess the muscles of the pelvic floor, seeing how well it contracts and relaxes and if there is muscle symmetry. After, the patient will get dressed and PT and patient will discuss exam findings and plan of care. PT and patient discuss plan of care, schedule, attendance policy and HEP activities.  PELVIC MMT:   MMT eval  Vaginal 3/5, 6s, 3 reps  Internal Anal Sphincter   External Anal Sphincter   Puborectalis      (Blank rows = not tested)        TONE: WFL  PROLAPSE: Not seen in hooklying   TODAY'S TREATMENT:                                                                                                                               DATE:   10/19/24 EVAL Examination completed, findings reviewed, pt educated on POC, voiding mechanics, breathing mechanics, and abdominal massage. Pt motivated to participate in PT and agreeable to attempt recommendations.     PATIENT EDUCATION:  Education details: voiding mechanics, breathing mechanics, and abdominal massage Person educated: Patient Education method: Explanation, Demonstration, Tactile cues, Verbal cues, and Handouts Education comprehension: verbalized understanding, returned demonstration, verbal cues required, tactile cues required, and needs further education  HOME EXERCISE PROGRAM: To be given  ASSESSMENT:  CLINICAL IMPRESSION: Patient is a 53 y.o. female  who was seen today for physical therapy evaluation and treatment for constipation and urinary incontinence and increased urinary frequency and urgency. Pt reports she has had several new medications over the summer and has seen changes in symptoms with slight improvement with bladder symptoms and bowels are more constipated. Prior to this had more frequent stools post meals and consistency varying but now every other day with hard more difficult to pass stools. Pt found to have decreased flexibility in spine and  bil hips, decreased core and hip strength, tension in paraspinals and abdomen. Patient consented to internal pelvic floor assessment vaginally this date and found to have decreased strength, endurance, and coordination. Pt would benefit from additional PT to further address deficits.    OBJECTIVE IMPAIRMENTS: decreased activity tolerance, decreased coordination, decreased endurance, decreased mobility, decreased strength, increased fascial restrictions, increased muscle spasms, impaired flexibility, improper body mechanics, and postural dysfunction.   ACTIVITY LIMITATIONS: carrying, lifting,  bending, and continence  PARTICIPATION LIMITATIONS: meal prep, interpersonal relationship, community activity, and yard work  PERSONAL FACTORS: Time since onset of injury/illness/exacerbation are also affecting patient's functional outcome.   REHAB POTENTIAL: Good  CLINICAL DECISION MAKING: Stable/uncomplicated  EVALUATION COMPLEXITY: Low   GOALS: Goals reviewed with patient? Yes  SHORT TERM GOALS: Target date: 11/16/24  Pt to be I with HEP for carry over and continuing recommendations for improved outcomes.   Baseline: Goal status: INITIAL  2.  Pt will be independent with the knack, urge suppression technique, and double voiding in order to improve bladder habits and decrease urinary incontinence.   Baseline:  Goal status: INITIAL  3.  Pt will be independent with use of squatty potty, relaxed toileting mechanics, and improved bowel movement techniques in order to increase ease of bowel movements and complete evacuation.   Baseline:  Goal status: INITIAL   LONG TERM GOALS: Target date: 04/19/24  Pt to be I with advanced  HEP for carry over and continuing recommendations for improved outcomes.   Baseline:  Goal status: INITIAL  2.  Pt will report 4 BMs per week that are 75% fully evacuated due to improved muscle tone and coordination with bowel movements for improved bowel health Baseline:  Goal status: INITIAL  3.  Pt to demonstrate full ROM at lumbo pelvic complex for decreased strain at pelvic floor for emptying bowels.  Baseline:  Goal status: INITIAL  4.  Pt will report 50% less abdominal discomfort due to improved muscle tone throughout the core for improved tolerance to community outings.   Baseline:  Goal status: INITIAL  5.  Pt to demonstrate 5/5 hip strength for improved pelvic stability and decreased strain at pelvic floor for improved confidence with community outings Baseline:  Goal status: INITIAL  6.  Pt to report no more than 1 urinary incontinence  instance in a week for improved skin integrity and improved confidence with community outings.  Baseline:  Goal status: INITIAL  PLAN:  PT FREQUENCY: 1x/week  PT DURATION: 10 sessions  PLANNED INTERVENTIONS: 97110-Therapeutic exercises, 97530- Therapeutic activity, 97112- Neuromuscular re-education, 97535- Self Care, [id number]- Manual therapy, [phone]- Canalith repositioning, [id number]- Aquatic Therapy, [phone]- Electrical stimulation (manual), [id number]- Vasopneumatic device, [id number]- Ultrasound, [id number] (1-2 muscles), [id number] (3+ muscles)- Dry Needling, Patient/Family education, Taping, Joint mobilization, Spinal mobilization, Scar mobilization, Vestibular training, DME instructions, Cryotherapy, Moist heat, and Biofeedback  PLAN FOR NEXT SESSION: core and hip strengthening with pelvic floor coordination, voiding mechanics, breathing mechanics, hip and back mobility    Darryle Navy, PT, DPT 10/20/257:36 PM  [facility] [address] [place], [place] [address] Phone # [phone] Fax [phone]

## 2024-10-19 NOTE — Patient Instructions (Addendum)
 Types of Fiber  There are two main types of fiber:  insoluble and soluble.  Both of these types can prevent and relieve constipation and diarrhea, although some people find one or the other to be more easily digested.  This handout details information about both types of fiber. recommended 25-35 grams of fiber per day,  average 9-12 grams per meal   key is a balance between soluble and insoluble  Insoluble Fiber        Functions of Insoluble Fiber moves bulk through the intestines  controls and balances the pH (acidity) in the intestines   This type of fiber should be avoided or reduced if you have soft, frequent bowel movements or leakage      Benefits of Insoluble Fiber promotes regular bowel movement and prevents constipation  removes fecal waste through colon in less time  keeps an optimal pH in intestines to prevent microbes from producing cancer substances, therefore preventing colon cancer        Food Sources of Insoluble Fiber whole-wheat products  wheat bran "miller's bran" corn bran  flax seed or other seeds vegetables such as green beans, broccoli, cauliflower and potato skins  fruit skins and root vegetable skins  popcorn brown rice  Soluble Fiber( Types 5,6,7 looser stools)       Functions of Soluble Fiber  holds water in the colon to bulk and soften the stool prolongs stomach emptying time so that sugar is released and absorbed more slowly  prevent leakage associated with soft, frequent bowel movements.        Benefits of Soluble Fiber lowers total cholesterol and LDL cholesterol (the bad cholesterol) therefore reducing the risk of heart disease  regulates blood sugar for people with diabetes       Food Sources of Soluble Fiber oat/oat bran dried beans and peas  nuts  barley  flax seed or other seeds fruits such as oranges, pears, peaches, and apples  vegetables such as carrots  psyllium husk  prunes

## 2024-10-21 LAB — HM PAP SMEAR: HPV, high-risk: NEGATIVE

## 2024-10-22 ENCOUNTER — Ambulatory Visit: Admitting: Emergency Medicine

## 2024-10-27 ENCOUNTER — Ambulatory Visit (INDEPENDENT_AMBULATORY_CARE_PROVIDER_SITE_OTHER): Admitting: Emergency Medicine

## 2024-10-27 VITALS — BP 116/76 | HR 74 | Temp 98.7°F | Ht 65.0 in | Wt 247.2 lb

## 2024-10-27 DIAGNOSIS — I152 Hypertension secondary to endocrine disorders: Secondary | ICD-10-CM

## 2024-10-27 DIAGNOSIS — E1159 Type 2 diabetes mellitus with other circulatory complications: Secondary | ICD-10-CM

## 2024-10-27 DIAGNOSIS — E1169 Type 2 diabetes mellitus with other specified complication: Secondary | ICD-10-CM | POA: Diagnosis not present

## 2024-10-27 DIAGNOSIS — E785 Hyperlipidemia, unspecified: Secondary | ICD-10-CM

## 2024-10-27 DIAGNOSIS — Z23 Encounter for immunization: Secondary | ICD-10-CM | POA: Diagnosis not present

## 2024-10-27 DIAGNOSIS — Z7984 Long term (current) use of oral hypoglycemic drugs: Secondary | ICD-10-CM

## 2024-10-27 LAB — POCT GLYCOSYLATED HEMOGLOBIN (HGB A1C): HbA1c, POC (controlled diabetic range): 5.2 % (ref 0.0–7.0)

## 2024-10-27 MED ORDER — TIRZEPATIDE 7.5 MG/0.5ML ~~LOC~~ SOAJ: 7.5000 mg | SUBCUTANEOUS | 3 refills | Status: DC

## 2024-10-27 NOTE — Assessment & Plan Note (Signed)
 Hyperlipidemia with LDL at 144. Previous intolerance to Crestor . Zetia  was discontinued but reassured it should not cause muscle issues. Discussed importance of lowering LDL to less than 100, especially with diabetes, to prevent cardiovascular complications. - Restart Zetia . - Monitor cholesterol levels, aiming for LDL less than 100. - Continue lifestyle modifications including diet and exercise. - Reassess cholesterol levels in six months.  Discussed potential need to consider additional therapy which could simply be Nexletol versus potentially statin or other medication.   Type 2 diabetes with recent A1c of 5.2. Weight loss noted. Discussed importance of blood sugar control to prevent complications, especially with hyperlipidemia. - Continue Mounjaro  and stop metformin . - Encourage continued weight loss and lifestyle modifications.

## 2024-10-27 NOTE — Progress Notes (Signed)
 Bobbette KANDICE Loge 53 y.o.   Chief Complaint  Patient presents with   Follow-up    Patient states she is starting to get hungry again on the medication. Asking if it is time to up her dose.     HISTORY OF PRESENT ILLNESS: This is a 53 y.o. female here for 37-month follow-up of chronic medical conditions including hypertension diabetes Overall doing well.  Has no complaints or medical concerns today. Lab Results  Component Value Date   HGBA1C 5.1 07/21/2024   Wt Readings from Last 3 Encounters:  10/27/24 247 lb 3.2 oz (112.1 kg)  09/22/24 252 lb 9.6 oz (114.6 kg)  07/31/24 255 lb (115.7 kg)     HPI   Prior to Admission medications   Medication Sig Start Date End Date Taking? Authorizing Provider  ACCU-CHEK GUIDE TEST test strip USE AS DIRECTED IN THE MORNING, AT NOON, AND AT BEDTIME. 04/29/24  Yes Chanae Gemma, Emil Schanz, MD  albuterol  (VENTOLIN  HFA) 108 [phone] Base) MCG/ACT inhaler Inhale 2 puffs into the lungs every 6 (six) hours as needed for wheezing or shortness of breath. 02/12/24  Yes Hunsucker, Donnice SAUNDERS, MD  AMBULATORY NON FORMULARY MEDICATION Compression knee sleeve for pre-patellar bursitis Dispense 1 pre-patellar bursitis Use as needed 08/01/22  Yes Joane Artist RAMAN, MD  Blood Glucose Monitoring Suppl DEVI 1 each by Does not apply route in the morning, at noon, and at bedtime. May substitute to any manufacturer covered by patient's insurance. 02/19/24  Yes Jason Leita Repine, FNP  Blood Pressure Monitoring (OMRON 3 SERIES BP MONITOR) DEVI Use to check blood pressure daily. 03/09/24  Yes Jason Leita Repine, FNP  ciclopirox  (PENLAC ) 8 % solution Apply topically at bedtime. Apply over nail and surrounding skin. Apply daily over previous coat. After seven (7) days, may remove with alcohol and continue cycle. Patient taking differently: Apply topically at bedtime. Apply over nail and surrounding skin. Apply daily over previous coat. After seven (7) days, may remove with alcohol  and continue cycle. As needed 10/16/23  Yes Tobie Franky SQUIBB, DPM  Continuous Glucose Receiver (DEXCOM G7 RECEIVER) DEVI To check for blood sugar 07/21/24  Yes [place] Rico, Emil Schanz, MD  Continuous Glucose Sensor (DEXCOM G7 SENSOR) MISC Pt to use to check sugar level every 07/21/24  Yes [place] Peifer, Emil Schanz, MD  dapagliflozin  propanediol (FARXIGA ) 10 MG TABS tablet Take 1 tablet (10 mg total) by mouth daily before breakfast. 04/20/24  Yes Terrisa Curfman, Emil Schanz, MD  diclofenac  Sodium (VOLTAREN ) 1 % GEL Apply 4 g topically 4 (four) times daily. To affected joint. 08/01/22  Yes Joane Artist RAMAN, MD  ezetimibe  (ZETIA ) 10 MG tablet Take 1 tablet (10 mg total) by mouth daily. 04/20/24  Yes Eusebio Blazejewski, Emil Schanz, MD  fluticasone  (FLONASE ) 50 MCG/ACT nasal spray Place 1 spray into both nostrils in the morning and at bedtime. Patient taking differently: Place 1 spray into both nostrils as needed. 04/03/22  Yes Kommor, Madison, MD  hydrOXYzine  (VISTARIL ) 25 MG capsule TAKE 1 CAPSULE (25 MG TOTAL) BY MOUTH EVERY 8 (EIGHT) HOURS AS NEEDED FOR ANXIETY. 10/21/23  Yes Jason Leita Repine, FNP  metFORMIN  (GLUCOPHAGE -XR) 500 MG 24 hr tablet Take 1 tablet (500 mg total) by mouth daily with breakfast. 07/21/24  Yes Deniro Laymon, Emil Schanz, MD  pantoprazole  (PROTONIX ) 40 MG tablet Take 1 tablet (40 mg total) by mouth daily. 07/29/24  Yes Craig Palma R, PA-C  potassium chloride  SA (KLOR-CON  M) 20 MEQ tablet Take 1 tablet (20 mEq total) by mouth once  for 1 dose. 07/29/24 10/27/24 Yes Craig Palma R, PA-C  tirzepatide  (MOUNJARO ) 5 MG/0.5ML Pen INJECT 5 MG SUBCUTANEOUSLY WEEKLY 08/03/24  Yes Aynsley Fleet Jose, MD  valsartan -hydrochlorothiazide  (DIOVAN -HCT) 160-25 MG tablet Take 1 tablet by mouth daily. 07/21/24  Yes Marguerita Stapp, Emil Schanz, MD    Allergies  Allergen Reactions   Crestor  [Rosuvastatin  Calcium ] Other (See Comments)    Severe myalgia   Gadolinium Derivatives Other (See Comments)    PT STARTED SHAKING AND FELT VERY  COLD INTERNALLY BUT FACE WAS VERY FLUSHED. THIS WAS A DELAYED REACTION (ABOUT 1 HOUR AFTER INJECTION). SHE TOOK BENADRYL  AND HYDRATED AND SYMPTOMS SUBSIDED    Patient Active Problem List   Diagnosis Date Noted   Urinary incontinence 07/21/2024   Generalized anxiety disorder 04/20/2024   Dyslipidemia associated with type 2 diabetes mellitus (HCC) 04/20/2024   Dermatofibroma of left lower leg 06/27/2023   Other hyperlipidemia 10/17/2022   Vitamin D  deficiency 08/28/2022   Insulin  resistance 08/28/2022   Depression 08/28/2022   Hypertension associated with diabetes (HCC) 07/27/2022   Family history of CHF (congestive heart failure) 07/27/2022   DOE (dyspnea on exertion) 07/27/2022   Hyperlipidemia due to dietary fat intake 07/27/2022   Metabolic syndrome 07/27/2022   OSA on CPAP 06/24/2019   Chronic seasonal allergic rhinitis 01/25/2017   Bacterial sinusitis 01/25/2017   Amenorrhea 06/28/2015   GERD (gastroesophageal reflux disease) 04/21/2015   Iron deficiency anemia 12/02/2013   Migraine headache without aura 03/11/2013   Family history of early CAD 09/08/2012    Past Medical History:  Diagnosis Date   Anxiety    Arthritis    Diabetes mellitus without complication (HCC)    Fibroids    GERD (gastroesophageal reflux disease)    Heartburn    History of headache    Hyperlipidemia    Hypertension    Obesity    Sleep apnea    Thyroid  condition    Vitamin D  deficiency     Past Surgical History:  Procedure Laterality Date   CHOLECYSTECTOMY  12/31/2004   Pulmonary function tests  09/14/2022   Normal lung volumes and DLCO.  Normal spirometry.   ROBOT ASSISTED MYOMECTOMY  12/31/2009   Stress Echocardiogram  08/2012   Exercise to max HR 164 bpm - 93% MPHR 177 bpm.  Normal BP and HR response.  No electrical or echocardiographic evidence of exercise-induced ischemia.  Normal EF, baseline > 55% with no RWMA.  Peak stress LVEF increased to 65-70% with no RWMA.  LOW RISK.    TONSILLECTOMY     TRANSTHORACIC ECHOCARDIOGRAM  08/15/2022   NORMAL.  EF 55 to 60%.  No RWMA.  Normal diastolic parameters.  Unable to assess PAP and otherwise normal RV.  Normal AOV and MV.  Mildly elevated RAP.   WISDOM TOOTH EXTRACTION      Social History   Socioeconomic History   Marital status: Legally Separated    Spouse name: Darryl   Number of children: 1   Years of education: Not on file   Highest education level: Some college, no degree  Occupational History   Occupation: Customer Service Manager: [employer]  Tobacco Use   Smoking status: Former    Current packs/day: 0.00    Types: Cigarettes    Quit date: 02/27/2021    Years since quitting: 3.6    Passive exposure: Past   Smokeless tobacco: Never   Tobacco comments:    Quit in Feb 27 2022. Tay 07/26/22  Vaping Use  Vaping status: Never Used  Substance and Sexual Activity   Alcohol use: No   Drug use: No   Sexual activity: Not on file  Other Topics Concern   Not on file  Social History Narrative   No caffeine daily - green tea 1-2 times a month    Social Drivers of Corporate Investment Banker Strain: Low Risk  (10/27/2024)   Overall Financial Resource Strain (CARDIA)    Difficulty of Paying Living Expenses: Not very hard  Food Insecurity: No Food Insecurity (10/27/2024)   Hunger Vital Sign    Worried About Running Out of Food in the Last Year: Never true    Ran Out of Food in the Last Year: Never true  Transportation Needs: No Transportation Needs (10/27/2024)   PRAPARE - Administrator, Civil Service (Medical): No    Lack of Transportation (Non-Medical): No  Physical Activity: Insufficiently Active (10/27/2024)   Exercise Vital Sign    Days of Exercise per Week: 4 days    Minutes of Exercise per Session: 20 min  Stress: Stress Concern Present (10/27/2024)   [employer] of Occupational Health - Occupational Stress Questionnaire    Feeling of Stress: To some extent  Social  Connections: Moderately Integrated (10/27/2024)   Social Connection and Isolation Panel    Frequency of Communication with Friends and Family: Twice a week    Frequency of Social Gatherings with Friends and Family: Once a week    Attends Religious Services: More than 4 times per year    Active Member of [employer] or Organizations: Yes    Attends Engineer, Structural: More than 4 times per year    Marital Status: Separated  Intimate Partner Violence: Not At Risk (04/30/2023)   Received from [facility]   HITS    Over the last 12 months how often did your partner physically hurt you?: Never    Over the last 12 months how often did your partner insult you or talk down to you?: Sometimes    Over the last 12 months how often did your partner threaten you with physical harm?: Never    Over the last 12 months how often did your partner scream or curse at you?: Sometimes    Family History  Problem Relation Age of Onset   Diabetes Mother    Hypertension Mother    Heart failure Mother    Stroke Mother    High Cholesterol Mother    Depression Mother    Anxiety disorder Mother    Sleep apnea Mother    Obesity Mother    Obstructive Sleep Apnea Mother    Coronary artery disease Mother 58       s/p PCI   Diabetes Mellitus II Mother    Heart attack Mother 64   COPD Father        smoker   High blood pressure Father    High Cholesterol Father    Breast cancer Sister    Skin cancer Sister    HIV/AIDS Brother    Heart disease Brother 55       enlarged heart = presented with Acute Resp Failure   Hyperlipidemia Other    Migraines Neg Hx    Esophageal cancer Neg Hx    Stomach cancer Neg Hx    Rectal cancer Neg Hx      Review of Systems  Constitutional: Negative.  Negative for chills and fever.  HENT: Negative.  Negative for congestion and sore  throat.   Respiratory: Negative.  Negative for cough and shortness of breath.   Cardiovascular: Negative.  Negative for chest pain and  palpitations.  Gastrointestinal:  Negative for abdominal pain, diarrhea, nausea and vomiting.  Genitourinary: Negative.  Negative for dysuria and hematuria.  Skin: Negative.  Negative for rash.  Neurological: Negative.  Negative for dizziness and headaches.  All other systems reviewed and are negative.   Vitals:   10/27/24 1547  BP: 116/76  Pulse: 74  Temp: 98.7 F (37.1 C)  SpO2: 96%    Physical Exam Vitals reviewed.  Constitutional:      Appearance: Normal appearance.  HENT:     Head: Normocephalic.  Eyes:     Extraocular Movements: Extraocular movements intact.  Cardiovascular:     Rate and Rhythm: Normal rate and regular rhythm.  Pulmonary:     Effort: Pulmonary effort is normal.     Breath sounds: Normal breath sounds.  Skin:    General: Skin is warm and dry.     Capillary Refill: Capillary refill takes less than 2 seconds.  Neurological:     General: No focal deficit present.     Mental Status: She is alert and oriented to person, place, and time.  Psychiatric:        Mood and Affect: Mood normal.        Behavior: Behavior normal.    Results for orders placed or performed in visit on 10/27/24 (from the past 24 hours)  POCT glycosylated hemoglobin (Hb A1C)     Status: None   Collection Time: 10/27/24  4:14 PM  Result Value Ref Range   Hemoglobin A1C     HbA1c POC (<> result, manual entry)     HbA1c, POC (prediabetic range)     HbA1c, POC (controlled diabetic range) 5.2 0.0 - 7.0 %     ASSESSMENT & PLAN: A total of 43 minutes was spent with the patient and counseling/coordination of care regarding preparing for this visit, review of most recent office visit notes, review of multiple chronic medical conditions and their management, cardiovascular risks associated with hypertension and diabetes, review of all medications and changes made, review of most recent bloodwork results including interpretation of today's hemoglobin A1c, review of health maintenance  items, education on nutrition, prognosis, documentation, and need for follow up.   Problem List Items Addressed This Visit       Cardiovascular and Mediastinum   Hypertension associated with diabetes (HCC) (Chronic)   BP Readings from Last 3 Encounters:  10/27/24 116/76  09/22/24 121/73  07/31/24 127/80  Well-controlled hypertension Continue valsartan  HCT 160-25 mg daily Well-controlled diabetes with hemoglobin A1c of 5.2 Recommend to increase Mounjaro  dose to 7.5 mg weekly Okay to stop metformin  Continue Farxiga  10 mg daily Cardiovascular risks associated with hypertension and diabetes discussed Diet and nutrition discussed Follow-up in 3 months       Relevant Medications   tirzepatide  (MOUNJARO ) 7.5 MG/0.5ML Pen     Endocrine   Dyslipidemia associated with type 2 diabetes mellitus (HCC) - Primary (Chronic)   Hyperlipidemia with LDL at 144. Previous intolerance to Crestor . Zetia  was discontinued but reassured it should not cause muscle issues. Discussed importance of lowering LDL to less than 100, especially with diabetes, to prevent cardiovascular complications. - Restart Zetia . - Monitor cholesterol levels, aiming for LDL less than 100. - Continue lifestyle modifications including diet and exercise. - Reassess cholesterol levels in six months.  Discussed potential need to consider additional therapy which could  simply be Nexletol versus potentially statin or other medication.   Type 2 diabetes with recent A1c of 5.2. Weight loss noted. Discussed importance of blood sugar control to prevent complications, especially with hyperlipidemia. - Continue Mounjaro  and stop metformin . - Encourage continued weight loss and lifestyle modifications.      Relevant Medications   tirzepatide  (MOUNJARO ) 7.5 MG/0.5ML Pen   Other Relevant Orders   POCT glycosylated hemoglobin (Hb A1C) (Completed)   Other Visit Diagnoses       Immunization due       Relevant Orders   Flu vaccine  trivalent PF, 6mos and older(Flulaval,Afluria,Fluarix,Fluzone) (Completed)      Patient Instructions  Diabetes Mellitus and Nutrition Learn about nutritional building blocks in different types of food, which are the most important for a person with diabetes, and how to eat a balanced, healthy diet. To view the content, go to this web address: [email or web address]  This video will expire on: 12/11/2025. If you need access to this video following this date, please reach out to the healthcare provider who assigned it to you. This information is not intended to replace advice given to you by your health care provider. Make sure you discuss any questions you have with your health care provider. Elsevier Patient Education  2024 Elsevier Inc.    Emil Schaumann, MD [facility]

## 2024-10-27 NOTE — Assessment & Plan Note (Signed)
 BP Readings from Last 3 Encounters:  10/27/24 116/76  09/22/24 121/73  07/31/24 127/80  Well-controlled hypertension Continue valsartan  HCT 160-25 mg daily Well-controlled diabetes with hemoglobin A1c of 5.2 Recommend to increase Mounjaro  dose to 7.5 mg weekly Okay to stop metformin  Continue Farxiga  10 mg daily Cardiovascular risks associated with hypertension and diabetes discussed Diet and nutrition discussed Follow-up in 3 months

## 2024-10-27 NOTE — Patient Instructions (Signed)
 Diabetes Mellitus and Nutrition Learn about nutritional building blocks in different types of food, which are the most important for a person with diabetes, and how to eat a balanced, healthy diet. To view the content, go to this web address: [email or web address]  This video will expire on: 12/11/2025. If you need access to this video following this date, please reach out to the healthcare provider who assigned it to you. This information is not intended to replace advice given to you by your health care provider. Make sure you discuss any questions you have with your health care provider. Elsevier Patient Education  2024 [employer].

## 2024-10-28 ENCOUNTER — Telehealth: Payer: Self-pay | Admitting: Emergency Medicine

## 2024-10-28 DIAGNOSIS — E1169 Type 2 diabetes mellitus with other specified complication: Secondary | ICD-10-CM

## 2024-10-28 DIAGNOSIS — I152 Hypertension secondary to endocrine disorders: Secondary | ICD-10-CM

## 2024-10-29 ENCOUNTER — Telehealth: Payer: Self-pay

## 2024-10-29 ENCOUNTER — Other Ambulatory Visit (HOSPITAL_COMMUNITY): Payer: Self-pay

## 2024-10-29 NOTE — Telephone Encounter (Signed)
 Copied from CRM #[id number]. Topic: Clinical - Medication Prior Auth >> Oct 29, 2024  9:13 AM Treva T wrote: Reason for CRM: Patient calling, states per pharmacy needs a medication PA for increased dose of Mounjaro  7.5 mg.  Patient call back [phone].   [id number] [address] [place], [place] - [address] [place] [address] Phone: [phone] Fax: [phone]

## 2024-10-29 NOTE — Telephone Encounter (Signed)
 Good morning ya'll! Is there anyway we can get the prior authorization started for the medication attached? Any help is appreciated!

## 2024-10-29 NOTE — Telephone Encounter (Signed)
 Initiate PA please.

## 2024-11-02 ENCOUNTER — Ambulatory Visit (INDEPENDENT_AMBULATORY_CARE_PROVIDER_SITE_OTHER): Admitting: Neurology

## 2024-11-02 DIAGNOSIS — G4733 Obstructive sleep apnea (adult) (pediatric): Secondary | ICD-10-CM | POA: Diagnosis not present

## 2024-11-02 DIAGNOSIS — G4734 Idiopathic sleep related nonobstructive alveolar hypoventilation: Secondary | ICD-10-CM

## 2024-11-03 NOTE — Progress Notes (Unsigned)
 SABRA

## 2024-11-04 ENCOUNTER — Other Ambulatory Visit: Payer: Self-pay | Admitting: Emergency Medicine

## 2024-11-04 DIAGNOSIS — I152 Hypertension secondary to endocrine disorders: Secondary | ICD-10-CM

## 2024-11-05 NOTE — Procedures (Signed)
 [facility]  HOME SLEEP TEST (SANSA) REPORT (Mail-Out Device):   STUDY DATE: 10/26/2024  DOB: 1971/08/23  MRN: [id number]  ORDERING CLINICIAN: True Mar, MD, PhD   REFERRING CLINICIAN: Greig Forbes, NP  CLINICAL INFORMATION/HISTORY (obtained from visit note dated 09/22/2024): .  53 year old female with an underlying medical history of vitamin D  deficiency, reflux disease, allergies, headaches, dizziness and obesity who presents for reevaluation of her obstructive sleep apnea.  She has been compliant with AutoPap of 7 to 14 cm with EPR of 3 with good apnea control and good tolerance of treatment.  She is working on weight loss.  She should be eligible for a new machine.  BMI (at the time of sleep clinic visit and/or test date): 42 kg/m  FINDINGS:   Study Protocol:    The SANSA single-point-of-skin-contact chest-worn sensor - an FDA cleared and DOT approved type 4 home sleep test device - measures eight physiological channels,  including blood oxygen saturation (measured via PPG [photoplethysmography]), EKG-derived heart rate, respiratory effort, chest movement (measured via accelerometer), snoring, body position, and actigraphy. The device is designed to be worn for up to 10 hours per study.   Sleep Summary:   Total Recording Time (hours, min): 9 hours, 11 min  Total Effective Sleep Time (hours, min):  6 hours, 21 min  Sleep Efficiency (%):    69%   Respiratory Indices:   Calculated sAHI (per hour):  31.1/hour         Oxygen Saturation Statistics:    Oxygen Saturation (%) Mean: 91.4%   Minimum oxygen saturation (%):                 71.7%   O2 Saturation Range (%): 71.7-96.8%   Time below or at 88% saturation: 18 min   Pulse Rate Statistics:   Pulse Mean (bpm):    70/min    Pulse Range (51-109/min)   Snoring: Mild to moderate  IMPRESSION/DIAGNOSES:   OSA (obstructive sleep apnea), severe Nocturnal Hypoxemia  RECOMMENDATIONS:   This home  sleep test demonstrates severe obstructive sleep apnea with a total AHI of 31.1/hour and O2 nadir of 71.7% with significant time below or at 88% saturation of over 15 minutes for the study, indicating nocturnal hypoxemia. Snoring was detected, in the mild to moderate range.  Ongoing treatment with positive airway pressure is highly recommended. The patient has been compliant with her AutoPap of 7 to 14 cm with EPR of 3.  She has benefited from treatment and has good apnea control and good tolerance on her current settings.  She should be eligible for a new machine and I recommend prescribing a new AutoPap machine, keeping her settings the same, mask of choice, sized to fit.  Ongoing full compliance with PAP therapy should be encouraged.  A laboratory attended titration study can be considered in the future for optimization of treatment settings and to improve tolerance and compliance, if needed, down the road. Alternative treatment options are limited secondary to the severity of the patient's sleep disordered breathing, but may include surgical treatment with an implantable hypoglossal nerve stimulator (in carefully selected candidates, meeting criteria).  Concomitant weight loss is recommended (where clinically appropriate). Please note, that untreated obstructive sleep apnea may carry additional perioperative morbidity. Patients with significant obstructive sleep apnea should receive perioperative PAP therapy and the surgeons and particularly the anesthesiologist should be informed of the diagnosis and the severity of the sleep disordered breathing. The patient should be cautioned not to drive, work  at heights, or operate dangerous or heavy equipment when tired or sleepy. Review and reiteration of good sleep hygiene measures should be pursued with any patient. Other causes of the patient's symptoms, including circadian rhythm disturbances, an underlying mood disorder, medication effect and/or an underlying  medical problem cannot be ruled out based on this test. Clinical correlation is recommended.  The patient and her referring provider will be notified of the test results. The patient will be seen in follow up in sleep clinic at [facility].  I certify that I have reviewed the raw data recording prior to the issuance of this report in accordance with the standards of the American Academy of Sleep Medicine (AASM).    INTERPRETING PHYSICIAN:   True Mar, MD, PhD Medical Director, [facility]) Diplomat, ABPN (Neurology and Sleep)   [facility] [address], [address] [place], [place] [address] [phone]

## 2024-11-07 ENCOUNTER — Other Ambulatory Visit: Payer: Self-pay | Admitting: Emergency Medicine

## 2024-11-09 ENCOUNTER — Ambulatory Visit: Payer: Self-pay | Admitting: Family Medicine

## 2024-11-09 DIAGNOSIS — G4733 Obstructive sleep apnea (adult) (pediatric): Secondary | ICD-10-CM

## 2024-12-09 NOTE — Therapy (Signed)
 OUTPATIENT PHYSICAL THERAPY FEMALE PELVIC TREATMENT   Patient Name: Amanda Mcintosh MRN: [id number] DOB:[birth year], 53 y.o., female Today's Date: 12/10/2024  END OF SESSION:  PT End of Session - 12/10/24 1632     Visit Number 2    Date for Recertification  04/19/25    Authorization Type [employer] [employer]ICA[employer] [place]    PT Start Time 1624    PT Stop Time 1710    PT Time Calculation (min) 46 min    Activity Tolerance Patient tolerated treatment well    Behavior During Therapy WFL for tasks assessed/performed           Past Medical History:  Diagnosis Date   Anxiety    Arthritis    Diabetes mellitus without complication (HCC)    Fibroids    GERD (gastroesophageal reflux disease)    Heartburn    History of headache    Hyperlipidemia    Hypertension    Obesity    Sleep apnea    Thyroid  condition    Vitamin D  deficiency    Past Surgical History:  Procedure Laterality Date   CHOLECYSTECTOMY  12/31/2004   Pulmonary function tests  09/14/2022   Normal lung volumes and DLCO.  Normal spirometry.   ROBOT ASSISTED MYOMECTOMY  12/31/2009   Stress Echocardiogram  08/2012   Exercise to max HR 164 bpm - 93% MPHR 177 bpm.  Normal BP and HR response.  No electrical or echocardiographic evidence of exercise-induced ischemia.  Normal EF, baseline > 55% with no RWMA.  Peak stress LVEF increased to 65-70% with no RWMA.  LOW RISK.   TONSILLECTOMY     TRANSTHORACIC ECHOCARDIOGRAM  08/15/2022   NORMAL.  EF 55 to 60%.  No RWMA.  Normal diastolic parameters.  Unable to assess PAP and otherwise normal RV.  Normal AOV and MV.  Mildly elevated RAP.   WISDOM TOOTH EXTRACTION     Patient Active Problem List   Diagnosis Date Noted   Urinary incontinence 07/21/2024   Generalized anxiety disorder 04/20/2024   Dyslipidemia associated with type 2 diabetes mellitus (HCC) 04/20/2024   Dermatofibroma of left lower leg 06/27/2023   Other hyperlipidemia 10/17/2022   Vitamin D   deficiency 08/28/2022   Insulin  resistance 08/28/2022   Depression 08/28/2022   Hypertension associated with diabetes (HCC) 07/27/2022   Family history of CHF (congestive heart failure) 07/27/2022   Hyperlipidemia due to dietary fat intake 07/27/2022   Metabolic syndrome 07/27/2022   OSA on CPAP 06/24/2019   Chronic seasonal allergic rhinitis 01/25/2017   Amenorrhea 06/28/2015   GERD (gastroesophageal reflux disease) 04/21/2015   Iron deficiency anemia 12/02/2013   Migraine headache without aura 03/11/2013   Family history of early CAD 09/08/2012    PCP: [facility] Emil Schanz, MD   REFERRING PROVIDER: Craig Alan SAUNDERS, PA-C  REFERRING DIAG: X41.8 (ICD-10-CM) - Irritable bowel syndrome with constipation K21.9 (ICD-10-CM) - Gastroesophageal reflux disease, unspecified whether esophagitis present  THERAPY DIAG:  Muscle weakness (generalized)  Other muscle spasm  Unspecified lack of coordination  Abnormal posture  Pain in right hip  Rationale for Evaluation and Treatment: Rehabilitation  ONSET DATE: constipation since July, urinary urgency about 2 years ago, longer for SUI   SUBJECTIVE:  SUBJECTIVE STATEMENT: Patient reports that constipation is better, she has been using the squatty potty. Not on vaginal estrogen or patch.  Can take a sip of water and got to the bathroom in 5 minutes    Has urinary incontinence (coughing, sneezing, laughing, jumping, bending over) and increased frequency, does have constipation but never had issues before taking Mounjaro  (started in June)   Fluid intake: water - ~70 oz; no soda, minimal tea  FUNCTIONAL LIMITATIONS: just has to plan all activities due to having to go to the bathroom often  PERTINENT HISTORY:  Medications for current condition:  nothing related to symptoms  Surgeries: fibroids removed, gallbladder removed,  Other: postmenopausal  Sexual abuse: No  DIAGNOSTIC FINDINGS:  Post-void residual: Voiding Cystourethrogram (VCUG):  Ultrasound: PAIN:  Are you having pain? No   PRECAUTIONS: None  RED FLAGS: None   WEIGHT BEARING RESTRICTIONS: No  FALLS:  Has patient fallen in last 6 months? No  OCCUPATION: remote  ACTIVITY LEVEL : low  PLOF: Independent  PATIENT GOALS: to have less frequency of urine and more regular bowels   BOWEL MOVEMENT: Pain with bowel movement: No Type of bowel movement:Type (Bristol Stool Scale) 3-4, Frequency every other day, and Strain yes Fully empty rectum: No Leakage: No                                                     Pads: No Fiber supplement/laxative just bought miralax but hasn't taken yet  URINATION: Pain with urination: No Fully empty bladder: Nonot always sometimes feels like she needs to double void                                Post-void dribble: No Stream: Strong and Weak Urgency: Yes  Frequency:during the day sometimes back to back, sometimes if working will be a couple hours                                                         Nocturia: No   Leakage: Coughing, Sneezing, Laughing, Exercise, and Lifting Pads/briefs: Yes: liner daily 2-3x daily  INTERCOURSE:  Ability to have vaginal penetration Yes   Pain with intercourse: Initial Penetration Dryness: Yes a little Climax: yes Marinoff Scale: 0/3 Lubricant:  PREGNANCY: Vaginal deliveries 1 Episiotomy Yes  C-section deliveries 0 Currently pregnant No  PROLAPSE: Heaviness at bladder    OBJECTIVE:  Note: Objective measures were completed at Evaluation unless otherwise noted.  DIAGNOSTIC FINDINGS:    PATIENT SURVEYS:    PFIQ-7: 76  COGNITION: Overall cognitive status: Within functional limits for tasks assessed     SENSATION: Light touch: Appears intact  FUNCTIONAL TESTS:    Single leg stance: sway and rotation at pelvis noted bil <4s  Sit-up test:1/3   GAIT: WFL  POSTURE: rounded shoulders, forward head, and posterior pelvic tilt   LUMBARAROM/PROM:  A/PROM A/PROM  Eval (% available)  Flexion 75  Extension 100  Right lateral flexion 75  Left lateral flexion 75  Right rotation 75  Left rotation 75   (Blank rows = not tested)  LOWER EXTREMITY ROM:  Bil hamstrings and adductors limited by 25%  LOWER EXTREMITY MMT:  Bil hips grossly 4/5 PALPATION:  General: tightness in bil lumbar and thoracic paraspinals   Pelvic Alignment: WFL  Abdominal: no TTP but mild tension throughout lower abdomen                   External Perineal Exam: dryness noted                             Internal Pelvic Floor: no TTP  Patient confirms identification and approves PT to assess internal pelvic floor and treatment Yes No emotional/communication barriers or cognitive limitation. Patient is motivated to learn. Patient understands and agrees with treatment goals and plan. PT explains patient will be examined in standing, sitting, and lying down to see how their muscles and joints work. When they are ready, they will be asked to remove their underwear so PT can examine their perineum. The patient is also given the option of providing their own chaperone as one is not provided in our facility. The patient also has the right and is explained the right to defer or refuse any part of the evaluation or treatment including the internal exam. With the patient's consent, PT will use one gloved finger to gently assess the muscles of the pelvic floor, seeing how well it contracts and relaxes and if there is muscle symmetry. After, the patient will get dressed and PT and patient will discuss exam findings and plan of care. PT and patient discuss plan of care, schedule, attendance policy and HEP activities.  PELVIC MMT:   MMT eval  Vaginal 3/5, 6s, 3 reps  Internal Anal  Sphincter   External Anal Sphincter   Puborectalis      (Blank rows = not tested)        TONE: WFL  PROLAPSE: Not seen in hooklying   TODAY'S TREATMENT:                                                                                                                              DATE:   12/10/2024 Education on vaginal estrogen Review of goals, eval Neuro reed- seated hip adduction with ball with thera band horizontal abduction+exhale 15 reps Seated ball press into thigh with exhale 10 reps bilat Trial of vibration plate ( patient interested in purchasing one) Rowing with exhale with pelvic floor lift 10 reps_ green theraband on door handle     10/19/24 EVAL Examination completed, findings reviewed, pt educated on POC, voiding mechanics, breathing mechanics, and abdominal massage. Pt motivated to participate in PT and agreeable to attempt recommendations.     PATIENT EDUCATION:  Education details: voiding mechanics, breathing mechanics, and abdominal massage Person educated: Patient Education method: Explanation, Demonstration, Tactile cues, Verbal cues, and Handouts Education comprehension: verbalized understanding, returned demonstration, verbal cues required, tactile cues required, and needs further education  HOME EXERCISE PROGRAM:  Access Code: [email or web address] URL: [email or web address] Date: 12/10/2024 Prepared by: Cori Madelein Mahadeo  Program Notes exhale on exertion- blow out, engage pelvic floor and draw belly in. With repetition this will become automatic.  Exercises - Seated Pelvic Floor Contraction  - 1 x daily - 7 x weekly - 3 sets - 10 reps - Horizontal abd with TB with TRA breath  - 1 x daily - 7 x weekly - 3 sets - 10 reps - Pelvic Floor Contractions in Hooklying with Adduction  - 1 x daily - 7 x weekly - 3 sets - 10 reps - Seated Abdominal Press into [employer]  - 1 x daily - 7 x weekly - 2 sets - 10 reps - Rowing with Pelvic Floor Contraction  - 1 x daily  - 7 x weekly - 2 sets - 10 reps  ASSESSMENT:  CLINICAL IMPRESSION: Patient did well with her new exercises and education on vaginal estrogen.  Initial HEP and red and green  theraband issued.  Discussed reaching out to her Dr for a prescription for vaginal estrogen  Patient doing a lifestyle change to reduce her diabetes and blood pressure.   Pt would benefit from additional PT to further address deficits.     eval Patient is a 53 y.o. female  who was seen today for physical therapy evaluation and treatment for constipation and urinary incontinence and increased urinary frequency and urgency. Pt reports she has had several new medications over the summer and has seen changes in symptoms with slight improvement with bladder symptoms and bowels are more constipated. Prior to this had more frequent stools post meals and consistency varying but now every other day with hard more difficult to pass stools. Pt found to have decreased flexibility in spine and bil hips, decreased core and hip strength, tension in paraspinals and abdomen. Patient consented to internal pelvic floor assessment vaginally this date and found to have decreased strength, endurance, and coordination. Pt would benefit from additional PT to further address deficits.    OBJECTIVE IMPAIRMENTS: decreased activity tolerance, decreased coordination, decreased endurance, decreased mobility, decreased strength, increased fascial restrictions, increased muscle spasms, impaired flexibility, improper body mechanics, and postural dysfunction.   ACTIVITY LIMITATIONS: carrying, lifting, bending, and continence  PARTICIPATION LIMITATIONS: meal prep, interpersonal relationship, community activity, and yard work  PERSONAL FACTORS: Time since onset of injury/illness/exacerbation are also affecting patient's functional outcome.   REHAB POTENTIAL: Good  CLINICAL DECISION MAKING: Stable/uncomplicated  EVALUATION COMPLEXITY:  Low   GOALS: Goals reviewed with patient? Yes  SHORT TERM GOALS: Target date: 11/16/24  Pt to be I with HEP for carry over and continuing recommendations for improved outcomes.   Baseline: Goal status: not yet 12/10/2024  2.  Pt will be independent with the knack, urge suppression technique, and double voiding in order to improve bladder habits and decrease urinary incontinence.   Baseline:  Goal status: not yet 12/10/2024  3.  Pt will be independent with use of squatty potty, relaxed toileting mechanics, and improved bowel movement techniques in order to increase ease of bowel movements and complete evacuation.   Baseline:  Goal status: ongoing 12/10/2024   LONG TERM GOALS: Target date: 04/19/24  Pt to be I with advanced  HEP for carry over and continuing recommendations for improved outcomes.   Baseline:  Goal status: INITIAL  2.  Pt will report 4 BMs per week that are 75% fully evacuated due to improved muscle tone and coordination with bowel movements for improved bowel health Baseline:  Goal status: INITIAL  3.  Pt to demonstrate full ROM at lumbo pelvic complex for decreased strain at pelvic floor for emptying bowels.  Baseline:  Goal status: INITIAL  4.  Pt will report 50% less abdominal discomfort due to improved muscle tone throughout the core for improved tolerance to community outings.   Baseline:  Goal status: INITIAL  5.  Pt to demonstrate 5/5 hip strength for improved pelvic stability and decreased strain at pelvic floor for improved confidence with community outings Baseline:  Goal status: INITIAL  6.  Pt to report no more than 1 urinary incontinence instance in a week for improved skin integrity and improved confidence with community outings.  Baseline:  Goal status: INITIAL  PLAN:  PT FREQUENCY: 1x/week  PT DURATION: 10 sessions  PLANNED INTERVENTIONS: 97110-Therapeutic exercises, 97530- Therapeutic activity, 97112- Neuromuscular re-education,  97535- Self Care, [id number]- Manual therapy, [phone]- Canalith repositioning, [id number]- Aquatic Therapy, [phone]- Electrical stimulation (manual), [id number]- Vasopneumatic device, [id number]- Ultrasound, [id number] (1-2 muscles), [id number] (3+ muscles)- Dry Needling, Patient/Family education, Taping, Joint mobilization, Spinal mobilization, Scar mobilization, Vestibular training, DME instructions, Cryotherapy, Moist heat, and Biofeedback  PLAN FOR NEXT SESSION: core and hip strengthening with pelvic floor coordination, voiding mechanics, breathing mechanics, hip and back mobility, TTNS if urgency persists, bladder retraining, urge suppression techniques next   Mira Balon, PT 12/10/2024 4:35 PM  [facility] [address], [address] [place], [place] [address] Phone # [phone] Fax [phone]

## 2024-12-10 ENCOUNTER — Ambulatory Visit: Attending: Emergency Medicine | Admitting: Physical Therapy

## 2024-12-10 ENCOUNTER — Encounter: Payer: Self-pay | Admitting: Physical Therapy

## 2024-12-10 DIAGNOSIS — M62838 Other muscle spasm: Secondary | ICD-10-CM

## 2024-12-10 DIAGNOSIS — R293 Abnormal posture: Secondary | ICD-10-CM | POA: Insufficient documentation

## 2024-12-10 DIAGNOSIS — M25551 Pain in right hip: Secondary | ICD-10-CM

## 2024-12-10 DIAGNOSIS — M6281 Muscle weakness (generalized): Secondary | ICD-10-CM | POA: Diagnosis present

## 2024-12-10 DIAGNOSIS — R279 Unspecified lack of coordination: Secondary | ICD-10-CM | POA: Insufficient documentation

## 2024-12-17 ENCOUNTER — Ambulatory Visit: Admitting: Physical Therapy

## 2024-12-17 ENCOUNTER — Ambulatory Visit

## 2025-01-04 ENCOUNTER — Ambulatory Visit: Admitting: Physical Therapy

## 2025-01-11 ENCOUNTER — Telehealth: Payer: Self-pay | Admitting: Family Medicine

## 2025-01-11 ENCOUNTER — Other Ambulatory Visit: Payer: Self-pay | Admitting: Emergency Medicine

## 2025-01-11 ENCOUNTER — Ambulatory Visit: Admitting: Physical Therapy

## 2025-01-11 DIAGNOSIS — E1159 Type 2 diabetes mellitus with other circulatory complications: Secondary | ICD-10-CM

## 2025-01-11 NOTE — Telephone Encounter (Signed)
 Called and scheduled Initial Cpap appt with pt. Pt needs to be scheduled between 01/22/25-03/22/25.

## 2025-01-18 ENCOUNTER — Ambulatory Visit: Admitting: Physical Therapy

## 2025-01-25 ENCOUNTER — Ambulatory Visit: Admitting: Physical Therapy

## 2025-01-26 NOTE — Progress Notes (Unsigned)
 "   PATIENT: Amanda Mcintosh DOB: 16-Jul-1971  REASON FOR VISIT: follow up HISTORY FROM: patient  No chief complaint on file.    HISTORY OF PRESENT ILLNESS:  01/26/25 ALL:  Amanda Mcintosh returns for follow up for OSA on CPAP. HST 08/2024 showed a total AHI of 31.1/hour and O2 nadir of 71.7% with significant time below or at 88% saturation of over 15 minutes for the study, indicating nocturnal hypoxemia. She received new machine?  ONO?  09/22/2024 ALL:  Amanda Mcintosh returns for follow up for OSA on CPAP. She is doing well on therapy. She is using CPAP nightly for about 8.5 hours, on average. She does note benefit of using therapy. She is working on [employer]. She recently started Mounjaro  for DMT2. She has lost about 15lbs.  She is eligible for a new machine.       09/16/2023 ALL:  She returns for follow up for OSA on CPAP. She continues to do well. She is using CPAP most every day for about 7-8 hours. She denies concerns with machine or supplies. She recently started a new job and back to work full time. She is walking a mile, every day.     09/13/2022 ALL: She returns for follow up for OSA on CPAP. She continues to do well. She has adjusted to therapy and using every night. She denies concerns with machine or supplies. She was laid off from her job of 15 years. She is planning to look for a new role next year. Husband has prostate cancer.     03/28/2021 ALL: She returns for follow up for OSA on CPAP. She has continued fairly consistent use. She does not take it with her if she goes out of town. She spent 2 weeks with her daughter and did not take it with her. She feels that she sleeps better without CPAP but recognizes health benefits of using it. She has less headaches and does not wake feeling congested. She has recently started smoking. She has moved into a rental home that she doesn't love and her mother passed away. She is under more stress. She has a plan in place to stop  smoking and increase exercise. She would like to lose weight.     09/28/2020 ALL:  Amanda Mcintosh is a 54 y.o. female here today for follow up for OSA on CPAP. She is doing well. She admits there are days when she doesn't use CPAP. She went out of town this past weekend and did not use her machine. She does note benefit of using CPAP.  She wakes feeling well rested.   Compliance report dated 06/25/2020 through 09/22/2020 reveals that she used CPAP 69 of the past 90 days for compliance of 77%.  She used CPAP greater than 4 hours 64 of the past 90 days for compliance of 71%.  Average usage was 6 hours and 55 minutes.  Residual AHI was 0.6 on 7 to 14 cm of water and an EPR of three.  There was no significant leak noted.  HISTORY: (copied from my note on 06/27/2020)  Amanda Mcintosh is a 54 y.o. female here today for follow up for OSA on CPAP. She admits that she has not been compliant with CPAP usage recently. She has gone on several vacations and not taken her machine. She knows that she needs to use it. No other concerns. She has recently quit smoking. She is working on [employer].    Compliance report dated 03/17/2020 through  06/14/2020 reveals that she used CPAP 26 of the last 90 days for compliance of 29%.  She is CPAP greater than 4 hours 23 of the past 90 days for compliance of 26%.  Average usage on days used was 6 hours and 2 minutes.  Residual AHI was 0.7 on 7 to 14 cm of water and an EPR of 3.  There was no significant leak noted.   HISTORY: (copied from my note on 06/24/2019)   Amanda Mcintosh is a 54 y.o. female here today for follow up of OSA on CPAP.  She reports doing very well with CPAP therapy.  She has noted that she is grinding her teeth less.  Compliance download dated 05/25/2019 through 06/23/2019 reveals that she is using her machine 28 out of the last 30 days for compliance of 93%.  28 days were used for greater than 4 hours for compliance of 93%.  Average usage was 6  hours and 1 minute.  AHI was 0.5 on 7 to 14 cm of water and an EPR level of 3.  There was no significant leak.  She returns today for evaluation.     History (copied from Dr Obie note on 03/03/2019)   Dear Leita,    I saw your patient, Nessa Ramaker, upon your kind request in my sleep clinic today for initial consultation of her sleep disorder, in particular, concern for underlying obstructive sleep apnea. The patient is unaccompanied today. As you know, Ms. Delira is a 55 year old right-handed woman with an underlying medical history of reflux disease, vitamin D  deficiency, smoking, headaches, dizziness, seasonal allergies, chronic sinusitis, and morbid obesity with a BMI of over 40, who reports snoring and excessive daytime somnolence as well as witnessed apneas per familys report. I reviewed your office note from 01/28/2019. She had a home sleep test about 4-1/2 years ago and I reviewed the results. Test date was 09/14/2014, overall AHI was 9.2 per hour, O2 nadir was 81%. She weighed 231 pounds at the time, BMI of 38. Her Epworth sleepiness score is 7 out of 24 today, fatigue score is 18 out of 63. Shes not sleeping well. She reports a bedtime of 9 PM, some difficulty falling asleep. She does turn her bedroom TV off at 9 PM. She has a rise time of 6 AM. No night to night nocturia, occasional AM HAs, does have nasal congestion. Mom had OSA.  She had T/A as a child, no asthma. See Dr. Arlana for sinus d/s.  She lives with her husband, she has 1 child. She works for [employer]. She smokes one pack per day, does not utilize alcohol typically and does not drink caffeine on a regular basis.  She is fasting from her church, has an appointment for FU in wt management soon. She has seen an endocrinologist. She has seen an integrative health provider. She has had stress, lost her mom 2 years ago. Recently lost a friend.   REVIEW OF SYSTEMS: Out of a complete 14 system review of symptoms, the patient  complains only of the following symptoms, none and all other reviewed systems are negative.  ESS: 2/24, previously 3 FSS:30  ALLERGIES: Allergies  Allergen Reactions   Crestor  [Rosuvastatin  Calcium ] Other (See Comments)    Severe myalgia   Gadolinium Derivatives Other (See Comments)    PT STARTED SHAKING AND FELT VERY COLD INTERNALLY BUT FACE WAS VERY FLUSHED. THIS WAS A DELAYED REACTION (ABOUT 1 HOUR AFTER INJECTION). SHE TOOK BENADRYL  AND HYDRATED AND SYMPTOMS  SUBSIDED    HOME MEDICATIONS: Outpatient Medications Prior to Visit  Medication Sig Dispense Refill   ACCU-CHEK GUIDE TEST test strip USE AS DIRECTED IN THE MORNING, AT NOON, AND AT BEDTIME. 100 strip 0   albuterol  (VENTOLIN  HFA) 108 (90 Base) MCG/ACT inhaler Inhale 2 puffs into the lungs every 6 (six) hours as needed for wheezing or shortness of breath. 1 each 11   AMBULATORY NON FORMULARY MEDICATION Compression knee sleeve for pre-patellar bursitis Dispense 1 pre-patellar bursitis Use as needed 1 Units 0   Blood Glucose Monitoring Suppl DEVI 1 each by Does not apply route in the morning, at noon, and at bedtime. May substitute to any manufacturer covered by patient's insurance. 1 each 0   Blood Pressure Monitoring (OMRON 3 SERIES BP MONITOR) DEVI Use to check blood pressure daily. 1 each 0   ciclopirox  (PENLAC ) 8 % solution Apply topically at bedtime. Apply over nail and surrounding skin. Apply daily over previous coat. After seven (7) days, may remove with alcohol and continue cycle. (Patient taking differently: Apply topically at bedtime. Apply over nail and surrounding skin. Apply daily over previous coat. After seven (7) days, may remove with alcohol and continue cycle. As needed) 6.6 mL 0   Continuous Glucose Receiver (DEXCOM G7 RECEIVER) DEVI To check for blood sugar 1 each 0   Continuous Glucose Sensor (DEXCOM G7 SENSOR) MISC Pt to use to check sugar level every 14 each 8   dapagliflozin  propanediol (FARXIGA ) 10 MG TABS  tablet Take 1 tablet (10 mg total) by mouth daily before breakfast. 90 tablet 3   diclofenac  Sodium (VOLTAREN ) 1 % GEL Apply 4 g topically 4 (four) times daily. To affected joint. 100 g 11   ezetimibe  (ZETIA ) 10 MG tablet Take 1 tablet (10 mg total) by mouth daily. 90 tablet 3   fluticasone  (FLONASE ) 50 MCG/ACT nasal spray Place 1 spray into both nostrils in the morning and at bedtime. (Patient taking differently: Place 1 spray into both nostrils as needed.) 9.9 mL 2   hydrOXYzine  (VISTARIL ) 25 MG capsule TAKE 1 CAPSULE (25 MG TOTAL) BY MOUTH EVERY 8 (EIGHT) HOURS AS NEEDED FOR ANXIETY. 30 capsule 0   metFORMIN  (GLUCOPHAGE -XR) 500 MG 24 hr tablet TAKE 1 TABLET BY MOUTH EVERY DAY WITH BREAKFAST 90 tablet 0   pantoprazole  (PROTONIX ) 40 MG tablet Take 1 tablet (40 mg total) by mouth daily. 90 tablet 1   potassium chloride  SA (KLOR-CON  M) 20 MEQ tablet Take 1 tablet (20 mEq total) by mouth once for 1 dose. 3 tablet 0   tirzepatide  (MOUNJARO ) 7.5 MG/0.5ML Pen Inject 7.5 mg into the skin once a week. 6 mL 3   valsartan -hydrochlorothiazide  (DIOVAN -HCT) 160-25 MG tablet TAKE 1 TABLET BY MOUTH EVERY DAY 90 tablet 0   Facility-Administered Medications Prior to Visit  Medication Dose Route Frequency Provider Last Rate Last Admin   diclofenac  Sodium (VOLTAREN ) 1 % topical gel 4 g  4 g Topical QID Corey, Evan S, MD        PAST MEDICAL HISTORY: Past Medical History:  Diagnosis Date   Anxiety    Arthritis    Diabetes mellitus without complication (HCC)    Fibroids    GERD (gastroesophageal reflux disease)    Heartburn    History of headache    Hyperlipidemia    Hypertension    Obesity    Sleep apnea    Thyroid  condition    Vitamin D  deficiency     PAST SURGICAL HISTORY: Past Surgical  History:  Procedure Laterality Date   CHOLECYSTECTOMY  12/31/2004   Pulmonary function tests  09/14/2022   Normal lung volumes and DLCO.  Normal spirometry.   ROBOT ASSISTED MYOMECTOMY  12/31/2009   Stress  Echocardiogram  08/2012   Exercise to max HR 164 bpm - 93% MPHR 177 bpm.  Normal BP and HR response.  No electrical or echocardiographic evidence of exercise-induced ischemia.  Normal EF, baseline > 55% with no RWMA.  Peak stress LVEF increased to 65-70% with no RWMA.  LOW RISK.   TONSILLECTOMY     TRANSTHORACIC ECHOCARDIOGRAM  08/15/2022   NORMAL.  EF 55 to 60%.  No RWMA.  Normal diastolic parameters.  Unable to assess PAP and otherwise normal RV.  Normal AOV and MV.  Mildly elevated RAP.   WISDOM TOOTH EXTRACTION      FAMILY HISTORY: Family History  Problem Relation Age of Onset   Diabetes Mother    Hypertension Mother    Heart failure Mother    Stroke Mother    High Cholesterol Mother    Depression Mother    Anxiety disorder Mother    Sleep apnea Mother    Obesity Mother    Obstructive Sleep Apnea Mother    Coronary artery disease Mother 18       s/p PCI   Diabetes Mellitus II Mother    Heart attack Mother 38   COPD Father        smoker   High blood pressure Father    High Cholesterol Father    Breast cancer Sister    Skin cancer Sister    HIV/AIDS Brother    Heart disease Brother 51       enlarged heart = presented with Acute Resp Failure   Hyperlipidemia Other    Migraines Neg Hx    Esophageal cancer Neg Hx    Stomach cancer Neg Hx    Rectal cancer Neg Hx     SOCIAL HISTORY: Social History   Socioeconomic History   Marital status: Legally Separated    Spouse name: Darryl   Number of children: 1   Years of education: Not on file   Highest education level: Some college, no degree  Occupational History   Occupation: Customer Service Manager: [employer]  Tobacco Use   Smoking status: Former    Current packs/day: 0.00    Average packs/day: 0.3 packs/day    Types: Cigarettes    Quit date: 02/27/2021    Years since quitting: 3.9    Passive exposure: Past   Smokeless tobacco: Never   Tobacco comments:    Quit in Feb 27 2022. Tay 07/26/22   Vaping Use   Vaping status: Never Used  Substance and Sexual Activity   Alcohol use: No   Drug use: No   Sexual activity: Not on file  Other Topics Concern   Not on file  Social History Narrative   No caffeine daily - green tea 1-2 times a month    Social Drivers of Health   Tobacco Use: Medium Risk (12/10/2024)   Patient History    Smoking Tobacco Use: Former    Smokeless Tobacco Use: Never    Passive Exposure: Past  Physicist, Medical Strain: Low Risk (10/27/2024)   Overall Financial Resource Strain (CARDIA)    Difficulty of Paying Living Expenses: Not very hard  Food Insecurity: No Food Insecurity (10/27/2024)   Epic    Worried About Radiation Protection Practitioner of Food in the Last  Year: Never true    Ran Out of Food in the Last Year: Never true  Transportation Needs: No Transportation Needs (10/27/2024)   Epic    Lack of Transportation (Medical): No    Lack of Transportation (Non-Medical): No  Physical Activity: Insufficiently Active (10/27/2024)   Exercise Vital Sign    Days of Exercise per Week: 4 days    Minutes of Exercise per Session: 20 min  Stress: Stress Concern Present (10/27/2024)   [employer] of Occupational Health - Occupational Stress Questionnaire    Feeling of Stress: To some extent  Social Connections: Moderately Integrated (10/27/2024)   Social Connection and Isolation Panel    Frequency of Communication with Friends and Family: Twice a week    Frequency of Social Gatherings with Friends and Family: Once a week    Attends Religious Services: More than 4 times per year    Active Member of [employer] or Organizations: Yes    Attends Banker Meetings: More than 4 times per year    Marital Status: Separated  Intimate Partner Violence: Not At Risk (04/30/2023)   Received from [facility]   HITS    Over the last 12 months how often did your partner physically hurt you?: Never    Over the last 12 months how often did your partner insult you or talk down  to you?: Sometimes    Over the last 12 months how often did your partner threaten you with physical harm?: Never    Over the last 12 months how often did your partner scream or curse at you?: Sometimes  Depression (PHQ2-9): Medium Risk (10/27/2024)   Depression (PHQ2-9)    PHQ-2 Score: 8  Alcohol Screen: Not on file  Housing: Low Risk (10/27/2024)   Epic    Unable to Pay for Housing in the Last Year: No    Number of Times Moved in the Last Year: 0    Homeless in the Last Year: No  Utilities: Not At Risk (04/30/2023)   Received from [facility] Utilities    Threatened with loss of utilities: No  Health Literacy: Not on file      PHYSICAL EXAM  There were no vitals filed for this visit.     There is no height or weight on file to calculate BMI.  Generalized: Well developed, in no acute distress  Neurological examination  Mentation: Alert oriented to time, place, history taking. Follows all commands speech and language fluent Cranial nerve II-XII: Pupils were equal round reactive to light. Extraocular movements were full, visual field were full  Motor: The motor testing reveals 5 over 5 strength of all 4 extremities. Good symmetric motor tone is noted throughout.  Gait and station: Gait is normal.    DIAGNOSTIC DATA (LABS, IMAGING, TESTING) - I reviewed patient records, labs, notes, testing and imaging myself where available.      No data to display           Lab Results  Component Value Date   WBC 10.2 07/29/2024   HGB 13.3 07/29/2024   HCT 40.4 07/29/2024   MCV 85.6 07/29/2024   PLT 409.0 (H) 07/29/2024      Component Value Date/Time   NA 137 07/29/2024 1439   NA 141 10/10/2022 1131   K 3.1 (L) 07/29/2024 1439   CL 100 07/29/2024 1439   CO2 29 07/29/2024 1439   GLUCOSE 110 (H) 07/29/2024 1439   BUN 16 07/29/2024 1439  BUN 14 10/10/2022 1131   CREATININE 0.89 07/29/2024 1439   CALCIUM  9.3 07/29/2024 1439   PROT 7.3 07/29/2024 1439   PROT 7.1  10/10/2022 1131   ALBUMIN 4.2 07/29/2024 1439   ALBUMIN 4.2 10/10/2022 1131   AST 12 07/29/2024 1439   ALT 18 07/29/2024 1439   ALKPHOS 72 07/29/2024 1439   BILITOT 0.5 07/29/2024 1439   BILITOT 0.4 10/10/2022 1131   GFRNONAA >60 04/03/2022 1240   GFRAA >60 02/03/2019 0013   Lab Results  Component Value Date   CHOL 213 (H) 04/20/2024   HDL 37.10 (L) 04/20/2024   LDLCALC 144 (H) 04/20/2024   LDLDIRECT 139.0 09/06/2016   TRIG 162.0 (H) 04/20/2024   CHOLHDL 6 04/20/2024   Lab Results  Component Value Date   HGBA1C 5.2 10/27/2024   Lab Results  Component Value Date   VITAMINB12 >1537 (H) 04/20/2024   Lab Results  Component Value Date   TSH 0.96 04/12/2023     ASSESSMENT AND PLAN 54 y.o. year old female  has a past medical history of Anxiety, Arthritis, Diabetes mellitus without complication (HCC), Fibroids, GERD (gastroesophageal reflux disease), Heartburn, History of headache, Hyperlipidemia, Hypertension, Obesity, Sleep apnea, Thyroid  condition, and Vitamin D  deficiency. here with   No diagnosis found.  Minahil is doing well. Compliance report shows excellent compliance. She does recognize health benefits of using CPAP. She was encouraged to continue using nightly for at last 4 hours. She is eligible for a new machine. I will order HST. She will continue working on healthy lifestyle habits. She will continue follow up with PCP. Follow up with me pending receipt of new CPAP machine.     No orders of the defined types were placed in this encounter.    No orders of the defined types were placed in this encounter.      Greig Forbes, FNP-C 01/26/2025, 2:57 PM [facility] [address], [address] [place], [place] [address] [phone]  "

## 2025-01-26 NOTE — Patient Instructions (Incomplete)

## 2025-01-27 ENCOUNTER — Encounter: Payer: Self-pay | Admitting: Emergency Medicine

## 2025-01-27 ENCOUNTER — Ambulatory Visit (INDEPENDENT_AMBULATORY_CARE_PROVIDER_SITE_OTHER): Admitting: Emergency Medicine

## 2025-01-27 VITALS — BP 130/82 | HR 82 | Temp 98.4°F | Ht 65.0 in | Wt 248.0 lb

## 2025-01-27 DIAGNOSIS — F411 Generalized anxiety disorder: Secondary | ICD-10-CM | POA: Diagnosis not present

## 2025-01-27 DIAGNOSIS — E785 Hyperlipidemia, unspecified: Secondary | ICD-10-CM

## 2025-01-27 DIAGNOSIS — Z7984 Long term (current) use of oral hypoglycemic drugs: Secondary | ICD-10-CM

## 2025-01-27 DIAGNOSIS — E1169 Type 2 diabetes mellitus with other specified complication: Secondary | ICD-10-CM

## 2025-01-27 DIAGNOSIS — I152 Hypertension secondary to endocrine disorders: Secondary | ICD-10-CM

## 2025-01-27 DIAGNOSIS — E1159 Type 2 diabetes mellitus with other circulatory complications: Secondary | ICD-10-CM

## 2025-01-27 LAB — POCT GLYCOSYLATED HEMOGLOBIN (HGB A1C): HbA1c POC (<> result, manual entry): 5.2 %

## 2025-01-27 NOTE — Assessment & Plan Note (Signed)
 BP Readings from Last 3 Encounters:  01/27/25 130/82  10/27/24 116/76  09/22/24 121/73  Well-controlled hypertension Continue valsartan  HCT 160-25 mg daily Well-controlled diabetes with hemoglobin A1c of 5.2 Recommend to continue Mounjaro  dose to 7.5 mg weekly Okay to stop metformin  Continue Farxiga  10 mg daily Cardiovascular risks associated with hypertension and diabetes discussed Diet and nutrition discussed Follow-up in 6 months

## 2025-01-27 NOTE — Patient Instructions (Signed)

## 2025-01-27 NOTE — Assessment & Plan Note (Signed)
 Chronic stable condition.  Just started seeing therapist on a regular basis Does not want to start medication

## 2025-01-27 NOTE — Assessment & Plan Note (Signed)
 Chronic stable conditions Hemoglobin A1c of 5.2 today Continue Mounjaro  and Farxiga  Statin intolerant Diet and nutrition discussed today Lipid profile done today

## 2025-01-27 NOTE — Progress Notes (Signed)
 Amanda Mcintosh 54 y.o.   Chief Complaint  Patient presents with   Follow-up    HISTORY OF PRESENT ILLNESS: This is a 54 y.o. female here for 54-month follow-up of chronic medical conditions including diabetes, hypertension, dyslipidemia Overall doing well. Has no complaints or medical concerns today. Wt Readings from Last 3 Encounters:  01/27/25 248 lb (112.5 kg)  10/27/24 247 lb 3.2 oz (112.1 kg)  09/22/24 252 lb 9.6 oz (114.6 kg)     HPI   Prior to Admission medications  Medication Sig Start Date End Date Taking? Authorizing Provider  ACCU-CHEK GUIDE TEST test strip USE AS DIRECTED IN THE MORNING, AT NOON, AND AT BEDTIME. 04/29/24  Yes Corinna Burkman, Emil Schanz, MD  albuterol  (VENTOLIN  HFA) 108 (90 Base) MCG/ACT inhaler Inhale 2 puffs into the lungs every 6 (six) hours as needed for wheezing or shortness of breath. 02/12/24  Yes Hunsucker, Donnice SAUNDERS, MD  AMBULATORY NON FORMULARY MEDICATION Compression knee sleeve for pre-patellar bursitis Dispense 1 pre-patellar bursitis Use as needed 08/01/22  Yes Joane Artist RAMAN, MD  Blood Glucose Monitoring Suppl DEVI 1 each by Does not apply route in the morning, at noon, and at bedtime. May substitute to any manufacturer covered by patient's insurance. 02/19/24  Yes Jason Leita Repine, FNP  Blood Pressure Monitoring (OMRON 3 SERIES BP MONITOR) DEVI Use to check blood pressure daily. 03/09/24  Yes Jason Leita Repine, FNP  ciclopirox  (PENLAC ) 8 % solution Apply topically at bedtime. Apply over nail and surrounding skin. Apply daily over previous coat. After seven (7) days, may remove with alcohol and continue cycle. Patient taking differently: Apply topically at bedtime. Apply over nail and surrounding skin. Apply daily over previous coat. After seven (7) days, may remove with alcohol and continue cycle. As needed 10/16/23  Yes Tobie Franky SQUIBB, DPM  Continuous Glucose Receiver (DEXCOM G7 RECEIVER) DEVI To check for blood sugar 07/21/24  Yes  Riese Hellard, Emil Schanz, MD  Continuous Glucose Sensor (DEXCOM G7 SENSOR) MISC Pt to use to check sugar level every 07/21/24  Yes Xayvion Shirah, Emil Schanz, MD  dapagliflozin  propanediol (FARXIGA ) 10 MG TABS tablet Take 1 tablet (10 mg total) by mouth daily before breakfast. 04/20/24  Yes Lilian Fuhs, Emil Schanz, MD  diclofenac  Sodium (VOLTAREN ) 1 % GEL Apply 4 g topically 4 (four) times daily. To affected joint. 08/01/22  Yes Joane Artist RAMAN, MD  ezetimibe  (ZETIA ) 10 MG tablet Take 1 tablet (10 mg total) by mouth daily. 04/20/24  Yes Manroop Jakubowicz, Emil Schanz, MD  fluticasone  (FLONASE ) 50 MCG/ACT nasal spray Place 1 spray into both nostrils in the morning and at bedtime. Patient taking differently: Place 1 spray into both nostrils as needed. 04/03/22  Yes Kommor, Madison, MD  hydrOXYzine  (VISTARIL ) 25 MG capsule TAKE 1 CAPSULE (25 MG TOTAL) BY MOUTH EVERY 8 (EIGHT) HOURS AS NEEDED FOR ANXIETY. 10/21/23  Yes Jason Leita Repine, FNP  metFORMIN  (GLUCOPHAGE -XR) 500 MG 24 hr tablet TAKE 1 TABLET BY MOUTH EVERY DAY WITH BREAKFAST 01/11/25  Yes Adron Geisel, Emil Schanz, MD  pantoprazole  (PROTONIX ) 40 MG tablet Take 1 tablet (40 mg total) by mouth daily. 07/29/24  Yes Craig Alan SAUNDERS, PA-C  potassium chloride  SA (KLOR-CON  M) 20 MEQ tablet Take 1 tablet (20 mEq total) by mouth once for 1 dose. 07/29/24 01/27/25 Yes Craig Alan R, PA-C  tirzepatide  (MOUNJARO ) 7.5 MG/0.5ML Pen Inject 7.5 mg into the skin once a week. 10/27/24  Yes Venise Ellingwood, Emil Schanz, MD  valsartan -hydrochlorothiazide  (DIOVAN -HCT) 160-25 MG tablet TAKE 1 TABLET  BY MOUTH EVERY DAY 11/04/24  Yes Purcell Emil Schanz, MD    Allergies[1]  Patient Active Problem List   Diagnosis Date Noted   Urinary incontinence 07/21/2024   Generalized anxiety disorder 04/20/2024   Dyslipidemia associated with type 2 diabetes mellitus (HCC) 04/20/2024   Dermatofibroma of left lower leg 06/27/2023   Other hyperlipidemia 10/17/2022   Vitamin D  deficiency 08/28/2022    Insulin  resistance 08/28/2022   Depression 08/28/2022   Hypertension associated with diabetes (HCC) 07/27/2022   Family history of CHF (congestive heart failure) 07/27/2022   Hyperlipidemia due to dietary fat intake 07/27/2022   Metabolic syndrome 07/27/2022   OSA on CPAP 06/24/2019   Chronic seasonal allergic rhinitis 01/25/2017   Amenorrhea 06/28/2015   GERD (gastroesophageal reflux disease) 04/21/2015   Iron deficiency anemia 12/02/2013   Migraine headache without aura 03/11/2013   Family history of early CAD 09/08/2012    Past Medical History:  Diagnosis Date   Anxiety    Arthritis    Diabetes mellitus without complication (HCC)    Fibroids    GERD (gastroesophageal reflux disease)    Heartburn    History of headache    Hyperlipidemia    Hypertension    Obesity    Sleep apnea    Thyroid  condition    Vitamin D  deficiency     Past Surgical History:  Procedure Laterality Date   CHOLECYSTECTOMY  12/31/2004   Pulmonary function tests  09/14/2022   Normal lung volumes and DLCO.  Normal spirometry.   ROBOT ASSISTED MYOMECTOMY  12/31/2009   Stress Echocardiogram  08/2012   Exercise to max HR 164 bpm - 93% MPHR 177 bpm.  Normal BP and HR response.  No electrical or echocardiographic evidence of exercise-induced ischemia.  Normal EF, baseline > 55% with no RWMA.  Peak stress LVEF increased to 65-70% with no RWMA.  LOW RISK.   TONSILLECTOMY     TRANSTHORACIC ECHOCARDIOGRAM  08/15/2022   NORMAL.  EF 55 to 60%.  No RWMA.  Normal diastolic parameters.  Unable to assess PAP and otherwise normal RV.  Normal AOV and MV.  Mildly elevated RAP.   WISDOM TOOTH EXTRACTION      Social History   Socioeconomic History   Marital status: Legally Separated    Spouse name: Darryl   Number of children: 1   Years of education: Not on file   Highest education level: Some college, no degree  Occupational History   Occupation: Customer Service Manager: [employer]  Tobacco Use    Smoking status: Former    Current packs/day: 0.00    Average packs/day: 0.3 packs/day    Types: Cigarettes    Quit date: 02/27/2021    Years since quitting: 3.9    Passive exposure: Past   Smokeless tobacco: Never   Tobacco comments:    Quit in Feb 27 2022. Tay 07/26/22  Vaping Use   Vaping status: Never Used  Substance and Sexual Activity   Alcohol use: No   Drug use: No   Sexual activity: Not on file  Other Topics Concern   Not on file  Social History Narrative   No caffeine daily - green tea 1-2 times a month    Social Drivers of Health   Tobacco Use: Medium Risk (01/27/2025)   Patient History    Smoking Tobacco Use: Former    Smokeless Tobacco Use: Never    Passive Exposure: Past  Physicist, Medical Strain: Low Risk (10/27/2024)  Overall Financial Resource Strain (CARDIA)    Difficulty of Paying Living Expenses: Not very hard  Food Insecurity: No Food Insecurity (10/27/2024)   Epic    Worried About Programme Researcher, Broadcasting/film/video in the Last Year: Never true    Ran Out of Food in the Last Year: Never true  Transportation Needs: No Transportation Needs (10/27/2024)   Epic    Lack of Transportation (Medical): No    Lack of Transportation (Non-Medical): No  Physical Activity: Insufficiently Active (10/27/2024)   Exercise Vital Sign    Days of Exercise per Week: 4 days    Minutes of Exercise per Session: 20 min  Stress: Stress Concern Present (10/27/2024)   [employer] of Occupational Health - Occupational Stress Questionnaire    Feeling of Stress: To some extent  Social Connections: Moderately Integrated (10/27/2024)   Social Connection and Isolation Panel    Frequency of Communication with Friends and Family: Twice a week    Frequency of Social Gatherings with Friends and Family: Once a week    Attends Religious Services: More than 4 times per year    Active Member of [employer] or Organizations: Yes    Attends Banker Meetings: More than 4 times per year     Marital Status: Separated  Intimate Partner Violence: Not At Risk (04/30/2023)   Received from [facility]   HITS    Over the last 12 months how often did your partner physically hurt you?: Never    Over the last 12 months how often did your partner insult you or talk down to you?: Sometimes    Over the last 12 months how often did your partner threaten you with physical harm?: Never    Over the last 12 months how often did your partner scream or curse at you?: Sometimes  Depression (PHQ2-9): Low Risk (01/27/2025)   Depression (PHQ2-9)    PHQ-2 Score: 0  Alcohol Screen: Not on file  Housing: Low Risk (10/27/2024)   Epic    Unable to Pay for Housing in the Last Year: No    Number of Times Moved in the Last Year: 0    Homeless in the Last Year: No  Utilities: Not At Risk (04/30/2023)   Received from [facility]    Threatened with loss of utilities: No  Health Literacy: Not on file    Family History  Problem Relation Age of Onset   Diabetes Mother    Hypertension Mother    Heart failure Mother    Stroke Mother    High Cholesterol Mother    Depression Mother    Anxiety disorder Mother    Sleep apnea Mother    Obesity Mother    Obstructive Sleep Apnea Mother    Coronary artery disease Mother 28       s/p PCI   Diabetes Mellitus II Mother    Heart attack Mother 36   COPD Father        smoker   High blood pressure Father    High Cholesterol Father    Breast cancer Sister    Skin cancer Sister    HIV/AIDS Brother    Heart disease Brother 55       enlarged heart = presented with Acute Resp Failure   Hyperlipidemia Other    Migraines Neg Hx    Esophageal cancer Neg Hx    Stomach cancer Neg Hx    Rectal cancer Neg Hx  Review of Systems  Constitutional: Negative.  Negative for chills and fever.  HENT: Negative.  Negative for congestion and sore throat.   Respiratory: Negative.  Negative for cough and shortness of breath.   Cardiovascular:  Negative.  Negative for chest pain and palpitations.  Gastrointestinal:  Negative for abdominal pain, diarrhea, nausea and vomiting.  Genitourinary: Negative.  Negative for dysuria and hematuria.  Skin: Negative.  Negative for rash.  Neurological: Negative.  Negative for dizziness and headaches.  All other systems reviewed and are negative.   Vitals:   01/27/25 1542  BP: 130/82  Pulse: 82  Temp: 98.4 F (36.9 C)  SpO2: 97%    Physical Exam Vitals reviewed.  Constitutional:      Appearance: Normal appearance.  HENT:     Head: Normocephalic.  Eyes:     Extraocular Movements: Extraocular movements intact.  Cardiovascular:     Rate and Rhythm: Normal rate.  Pulmonary:     Effort: Pulmonary effort is normal.  Skin:    General: Skin is warm and dry.  Neurological:     Mental Status: She is alert and oriented to person, place, and time.  Psychiatric:        Mood and Affect: Mood normal.        Behavior: Behavior normal.    Results for orders placed or performed in visit on 01/27/25 (from the past 24 hours)  POCT HgB A1C     Status: Normal   Collection Time: 01/27/25  3:59 PM  Result Value Ref Range   Hemoglobin A1C     HbA1c POC (<> result, manual entry) 5.2 4.0 - 5.6 %   HbA1c, POC (prediabetic range)     HbA1c, POC (controlled diabetic range)       ASSESSMENT & PLAN: A total of 42 minutes was spent with the patient and counseling/coordination of care regarding preparing for this visit, review of most recent office visit notes, review of multiple chronic medical conditions and their management, cardiovascular risks associated with diabetes and hypertension, review of all medications, review of most recent bloodwork results including interpretation of today's hemoglobin A1c, review of health maintenance items, education on nutrition, prognosis, documentation, and need for follow up.   Problem List Items Addressed This Visit       Cardiovascular and Mediastinum    Hypertension associated with diabetes (HCC) - Primary (Chronic)   BP Readings from Last 3 Encounters:  01/27/25 130/82  10/27/24 116/76  09/22/24 121/73  Well-controlled hypertension Continue valsartan  HCT 160-25 mg daily Well-controlled diabetes with hemoglobin A1c of 5.2 Recommend to continue Mounjaro  dose to 7.5 mg weekly Okay to stop metformin  Continue Farxiga  10 mg daily Cardiovascular risks associated with hypertension and diabetes discussed Diet and nutrition discussed Follow-up in 6 months       Relevant Orders   POCT HgB A1C (Completed)   Comprehensive metabolic panel with GFR   CBC with Differential/Platelet   Lipid panel     Endocrine   Dyslipidemia associated with type 2 diabetes mellitus (HCC) (Chronic)   Chronic stable conditions Hemoglobin A1c of 5.2 today Continue Mounjaro  and Farxiga  Statin intolerant Diet and nutrition discussed today Lipid profile done today      Relevant Orders   Comprehensive metabolic panel with GFR   CBC with Differential/Platelet   Lipid panel     Other   Generalized anxiety disorder   Chronic stable condition.  Just started seeing therapist on a regular basis Does not want to start medication  Patient Instructions  Diabetes: Carbohydrate Counting for Adults Carbohydrate counting is a method of keeping track of how many carbohydrates you eat. Eating carbohydrates increases the amount of sugar, also called glucose, in your blood. By counting how many carbohydrates you eat, you can improve how well you manage your blood sugar. This, in turn, helps you manage your diabetes. Carbohydrates are measured in grams (g) per serving. It's important to know how many carbohydrates (in grams or by serving size) you can have in each meal. This is different for every person. A dietitian can help you make a meal plan and calculate how many carbohydrates you should have at each meal and snack. What foods contain carbohydrates? Carbohydrates  are found in these foods: Grains, such as breads and cereals. Dried beans and soy products. Starchy vegetables, such as potatoes, peas, and corn. Fruit and fruit juices. Milk and yogurt. Sweets and snack foods like cake, cookies, candy, chips, and soft drinks. How do I count carbohydrates in foods? There are two ways to count carbohydrates in food. You can read food labels or learn standard serving sizes of foods. You can use either of these methods or a combination of both. Using the Nutrition Facts label The Nutrition Facts list is included on the labels of almost all packaged foods and drinks in the [place] It includes: The serving size. Information about nutrients in each serving. This includes the grams of carbohydrate per serving. To use the Nutrition Facts, decide how many servings you will have. Then, multiply the number of servings by the number of carbohydrates per serving. The resulting number is the total grams of carbohydrates that you'll be having. Learning the standard serving sizes of foods When you eat carbohydrate foods that aren't packaged or don't include Nutrition Facts on the label, you need to measure the servings in order to count the grams of carbohydrates. Measure the foods that you'll eat with a food scale or measuring cup, if needed. Decide how many standard-size servings you'll eat. Multiply the number of servings by 15. For foods that contain carbohydrates, one serving equals 15 g of carbohydrates. For example, if you eat 2 cups or 10 oz (300 g) of strawberries, you'll have eaten 2 servings and 30 g of carbohydrates (2 servings x 15 g = 30 g). For foods that have more than one food mixed, such as soups and casseroles, you must count the carbohydrates in each food that's included. Here's a list of standard serving sizes for common carbohydrate-rich foods. Each of these servings has about 15 g of carbohydrates: 1 slice of bread. 1 six-inch (15 cm) tortilla. ? cup or 2  oz (53 g) of cooked rice or pasta.  cup or 3 oz (85 g) of cooked or canned, drained, and rinsed beans or lentils.  cup or 3 oz (85 g) of a starchy vegetable, such as peas, corn, or squash.  cup or 4 oz (120 g) of hot cereal.  cup or 3 oz (85 g) of boiled or mashed potatoes, or  or 3 oz (85 g) of a large baked potato.  cup or 4 fl oz (118 mL) of fruit juice. 1 cup or 8 fl oz (237 mL) of milk. 1 small or 4 oz (106 g) apple.  or 2 oz (63 g) of a medium banana. 1 cup or 5 oz (150 g) of strawberries. 3 cups or 1 oz (28.3 g) of popped popcorn. What is an example of carbohydrate counting? To calculate the grams of  carbohydrates in this sample meal, follow the steps below. Sample meal 3 oz (85 g) chicken breast. ? cup or 4 oz (106 g) of brown rice.  cup or 3 oz (85 g) of corn. 1 cup or 8 fl oz (237 mL) of milk. 1 cup or 5 oz (150 g) of strawberries with sugar-free whipped topping. Carbohydrate calculation Identify the foods that have carbohydrates: Rice. Corn. Milk. Strawberries. Calculate how many servings you have of each food: 2 servings of rice. 1 serving of corn. 1 serving of milk. 1 serving of strawberries. Multiply each number of servings by 15 g: 2 servings of rice x 15 g = 30 g. 1 serving of corn x 15 g = 15 g. 1 serving of milk x 15 g = 15 g. 1 serving of strawberries x 15 g = 15 g. Add together all of the amounts to find the total grams of carbohydrates eaten: 30 g + 15 g + 15 g + 15 g = 75 g of carbohydrates total. Where to find more information To learn more, go to: American Diabetes Association at diabetes.org. Click Search and type carb counting. Find the link you need. Centers for Disease Control and Prevention at tonerpromos.no. Click Search and type diabetes. Find the link you need. Academy of Nutrition and Dietetics: eatright.org This information is not intended to replace advice given to you by your health care provider. Make sure you discuss any  questions you have with your health care provider. Document Revised: 12/04/2023 Document Reviewed: 12/04/2023 Elsevier Patient Education  2025 Elsevier Inc.     Emil Schaumann, MD [facility]    [1]  Allergies Allergen Reactions   Crestor  [Rosuvastatin  Calcium ] Other (See Comments)    Severe myalgia   Gadolinium Derivatives Other (See Comments)    PT STARTED SHAKING AND FELT VERY COLD INTERNALLY BUT FACE WAS VERY FLUSHED. THIS WAS A DELAYED REACTION (ABOUT 1 HOUR AFTER INJECTION). SHE TOOK BENADRYL  AND HYDRATED AND SYMPTOMS SUBSIDED

## 2025-01-28 ENCOUNTER — Ambulatory Visit: Payer: Self-pay | Admitting: Emergency Medicine

## 2025-01-28 DIAGNOSIS — E1169 Type 2 diabetes mellitus with other specified complication: Secondary | ICD-10-CM

## 2025-01-28 LAB — COMPREHENSIVE METABOLIC PANEL WITH GFR
ALT: 16 U/L (ref 3–35)
AST: 16 U/L (ref 5–37)
Albumin: 4 g/dL (ref 3.5–5.2)
Alkaline Phosphatase: 64 U/L (ref 39–117)
BUN: 19 mg/dL (ref 6–23)
CO2: 31 meq/L (ref 19–32)
Calcium: 9.5 mg/dL (ref 8.4–10.5)
Chloride: 101 meq/L (ref 96–112)
Creatinine, Ser: 0.89 mg/dL (ref 0.40–1.20)
GFR: 72.85 mL/min
Glucose, Bld: 84 mg/dL (ref 70–99)
Potassium: 4 meq/L (ref 3.5–5.1)
Sodium: 137 meq/L (ref 135–145)
Total Bilirubin: 0.4 mg/dL (ref 0.2–1.2)
Total Protein: 7 g/dL (ref 6.0–8.3)

## 2025-01-28 LAB — CBC WITH DIFFERENTIAL/PLATELET
Basophils Absolute: 0.1 10*3/uL (ref 0.0–0.1)
Basophils Relative: 1.3 % (ref 0.0–3.0)
Eosinophils Absolute: 0.1 10*3/uL (ref 0.0–0.7)
Eosinophils Relative: 1.5 % (ref 0.0–5.0)
HCT: 41 % (ref 36.0–46.0)
Hemoglobin: 13.8 g/dL (ref 12.0–15.0)
Lymphocytes Relative: 35.7 % (ref 12.0–46.0)
Lymphs Abs: 3.5 10*3/uL (ref 0.7–4.0)
MCHC: 33.5 g/dL (ref 30.0–36.0)
MCV: 86.6 fl (ref 78.0–100.0)
Monocytes Absolute: 0.6 10*3/uL (ref 0.1–1.0)
Monocytes Relative: 6 % (ref 3.0–12.0)
Neutro Abs: 5.5 10*3/uL (ref 1.4–7.7)
Neutrophils Relative %: 55.5 % (ref 43.0–77.0)
Platelets: 401 10*3/uL — ABNORMAL HIGH (ref 150.0–400.0)
RBC: 4.74 Mil/uL (ref 3.87–5.11)
RDW: 13.3 % (ref 11.5–15.5)
WBC: 9.8 10*3/uL (ref 4.0–10.5)

## 2025-01-28 LAB — LIPID PANEL
Cholesterol: 224 mg/dL — ABNORMAL HIGH (ref 28–200)
HDL: 33.4 mg/dL — ABNORMAL LOW
LDL Cholesterol: 147 mg/dL — ABNORMAL HIGH (ref 10–99)
NonHDL: 190.85
Total CHOL/HDL Ratio: 7
Triglycerides: 219 mg/dL — ABNORMAL HIGH (ref 10.0–149.0)
VLDL: 43.8 mg/dL — ABNORMAL HIGH (ref 0.0–40.0)

## 2025-01-29 ENCOUNTER — Telehealth: Payer: Self-pay

## 2025-01-29 NOTE — Progress Notes (Signed)
 Care Guide Pharmacy Note  01/29/2025 Name: [name] MRN: [id number] DOB: 1971-09-06  Referred By: Purcell Emil Schanz, MD Reason for referral: Call Attempt #1 and Complex Care Management (Outreach to sch ref w/ pharm)   Amanda Mcintosh is a 54 y.o. year old female who is a primary care patient of Sagardia, Miguel Jose, MD.  Amanda Mcintosh was referred to the pharmacist for assistance related to: HLD  Successful contact was made with the patient to discuss pharmacy services including being ready for the pharmacist to call at least 5 minutes before the scheduled appointment time and to have medication bottles and any blood pressure readings ready for review. The patient agreed to meet with the pharmacist via telephone visit on 02/01/2025.  Amanda Mcintosh [facility], [facility] Guide Direct Dial: [phone]  Fax: [phone]

## 2025-02-01 ENCOUNTER — Telehealth: Payer: Self-pay

## 2025-02-01 ENCOUNTER — Other Ambulatory Visit

## 2025-02-01 ENCOUNTER — Ambulatory Visit: Admitting: Family Medicine

## 2025-02-01 ENCOUNTER — Ambulatory Visit: Admitting: Physical Therapy

## 2025-02-01 ENCOUNTER — Other Ambulatory Visit: Payer: Self-pay | Admitting: Emergency Medicine

## 2025-02-01 DIAGNOSIS — E1169 Type 2 diabetes mellitus with other specified complication: Secondary | ICD-10-CM

## 2025-02-01 DIAGNOSIS — G4733 Obstructive sleep apnea (adult) (pediatric): Secondary | ICD-10-CM

## 2025-02-01 DIAGNOSIS — E559 Vitamin D deficiency, unspecified: Secondary | ICD-10-CM

## 2025-02-01 MED ORDER — VITAMIN D (ERGOCALCIFEROL) 1.25 MG (50000 UNIT) PO CAPS: 50000.0000 [IU] | ORAL_CAPSULE | ORAL | 1 refills | Status: AC

## 2025-02-01 MED ORDER — ATORVASTATIN CALCIUM 10 MG PO TABS: 10.0000 mg | ORAL_TABLET | ORAL | 0 refills | Status: AC

## 2025-02-01 MED ORDER — TIRZEPATIDE 10 MG/0.5ML ~~LOC~~ SOAJ: 10.0000 mg | SUBCUTANEOUS | 0 refills | Status: AC

## 2025-02-01 NOTE — Telephone Encounter (Unsigned)
 Copied from CRM #[id number]. Topic: Clinical - Medication Question >> Feb 01, 2025  1:18 PM Amanda Mcintosh wrote: Reason for CRM: Patient had a visit with pharmacist on 02-01-25 and was told she was going to be prescribed a new statin medication but patient does not know the name of it.  And needs it sent to [facility] [address] [place].

## 2025-02-01 NOTE — Telephone Encounter (Unsigned)
 Copied from CRM [id number]. Topic: Clinical - Medication Refill >> Feb 01, 2025  1:16 PM Roselie BROCKS wrote: Medication: mounjaro  10.75   Has the patient contacted their pharmacy? No (Agent: If no, request that the patient contact the pharmacy for the refill. If patient does not wish to contact the pharmacy document the reason why and proceed with request.) (Agent: If yes, when and what did the pharmacy advise?)  This is the patient's preferred pharmacy:  [id number] [place], [place] - [address] [place] [place] [address] Phone: [phone] Fax: [phone]   Is this the correct pharmacy for this prescription? Yes If no, delete pharmacy and type the correct one.   Has the prescription been filled recently? Yes  Is the patient out of the medication? Yes  Has the patient been seen for an appointment in the last year OR does the patient have an upcoming appointment? Yes  Can we respond through MyChart? Yes  Agent: Please be advised that Rx refills may take up to 3 business days. We ask that you follow-up with your pharmacy.

## 2025-02-01 NOTE — Progress Notes (Unsigned)
 "  02/01/2025 Name: Amanda Mcintosh MRN: [id number] DOB: November 05, 1971  Chief Complaint  Patient presents with   Diabetes   Hypertension   Hyperlipidemia   Medication Management    Amanda Mcintosh is a 54 y.o. year old female who presented for a telephone visit.   They were referred to the pharmacist by their PCP for assistance in managing diabetes and hyperlipidemia/cardiovascular risk reduction.   Subjective:  Care Team: Primary Care Provider: Purcell Emil Schanz, MD ; Next Scheduled Visit: 07/28/25   Medication Access/Adherence  Current Pharmacy:  [id number] [place] - [address] [place] [address] Phone: [phone] Fax: [phone]  [id number] - [place], [place] - [address] [place] [address] [place] [address] [place] [phone] Phone: [phone] Fax: [phone]  [id number] [place], [place] - [address] AT [address] [place] [address] Phone: [phone] Fax: [phone]   Patient reports affordability concerns with their medications: Yes  Patient reports access/transportation concerns to their pharmacy: No  Patient reports adherence concerns with their medications:  Yes    Pt expresses desire to get off of some medication.   Diabetes:  Current medications: Mounjaro  7.5 mg weekly, Farxiga  10 mg daily Medications tried in the past: metformin  XR, Rybelsus   Questions if she needs Farxiga  - cost is high, difficult to afford  Current physical activity: walks 15 minutes per day on treadmill. Planning to increase to 30 minutes daily and start gym classes  Macrovascular and Microvascular Risk Reduction:  Statin? no; patient previously intolerant to statin therapy; ACEi/ARB? yes (valsartan /HCTZ) Last urinary albumin/creatinine ratio:  Lab Results  Component Value Date   MICRALBCREAT Unable to calculate 04/20/2024   Last eye  exam:  Lab Results  Component Value Date   HMDIABEYEEXA No Retinopathy 10/08/2024   Last foot exam: No foot exam found Tobacco Use:  Tobacco Use: Medium Risk (01/27/2025)   Patient History    Smoking Tobacco Use: Former    Smokeless Tobacco Use: Never    Passive Exposure: Past   Hypertension:  Current medications: valsartan /hydrochlorothiazide  160-25 mg daily Medications previously tried: losartan /HCTZ  Patient has a validated, automated, upper arm home BP cuff Current blood pressure readings readings: not recently  Patient denies hypotensive s/sx including dizziness, lightheadedness.  Patient denies hypertensive symptoms including headache, chest pain, shortness of breath   Hyperlipidemia/ASCVD Risk Reduction  Current lipid lowering medications: ezetimbe 10 mg daily (pt self-discontinued, concerned she was going to start getting muscle pain) Medications tried in the past: rosuvastatin  20 mg (severe myalgia)  The 10-year ASCVD risk score (Arnett DK, et al., 2019) is: 9.4%   Values used to calculate the score:     Age: 54 years     Clinically relevant sex: Female     Is Non-Hispanic African American: No     Diabetic: Yes     Tobacco smoker: No     Systolic Blood Pressure: 130 mmHg     Is BP treated: Yes     HDL Cholesterol: 33.4 mg/dL     Total Cholesterol: 224 mg/dL   Objective:  Lab Results  Component Value Date   HGBA1C 5.2 01/27/2025    Lab Results  Component Value Date   CREATININE 0.89 01/27/2025   BUN 19 01/27/2025   NA 137 01/27/2025   K 4.0 01/27/2025   CL 101 01/27/2025   CO2 31 01/27/2025  Lab Results  Component Value Date   CHOL 224 (H) 01/27/2025   HDL 33.40 (L) 01/27/2025   LDLCALC 147 (H) 01/27/2025   LDLDIRECT 139.0 09/06/2016   TRIG 219.0 (H) 01/27/2025   CHOLHDL 7 01/27/2025    Medications Reviewed Today     Reviewed by Merceda Lela SAUNDERS, RPH-CPP (Pharmacist) on 02/01/25 at 1343  Med List Status: <None>   Medication Order  Taking? Sig Documenting Provider Last Dose Status Informant  ACCU-CHEK GUIDE TEST test strip [id number]  USE AS DIRECTED IN THE MORNING, AT NOON, AND AT BEDTIME. Sagardia, Miguel Jose, MD  Active   albuterol  (VENTOLIN  HFA) 108 [phone] Base) MCG/ACT inhaler [id number]  Inhale 2 puffs into the lungs every 6 (six) hours as needed for wheezing or shortness of breath. Hunsucker, Donnice SAUNDERS, MD  Active   AMBULATORY JESSE SCHLOSSMAN MEDICATION [id number]  Compression knee sleeve for pre-patellar bursitis Dispense 1 pre-patellar bursitis Use as needed Corey, Evan S, MD  Active   Blood Glucose Monitoring Suppl DEVI [id number]  1 each by Does not apply route in the morning, at noon, and at bedtime. May substitute to any manufacturer covered by patient's insurance. Jason Leita Repine, FNP  Active   Blood Pressure Monitoring (OMRON 3 SERIES BP MONITOR) DEVI [id number]  Use to check blood pressure daily. Jason Leita Repine, FNP  Active   ciclopirox  (PENLAC ) 8 % solution [id number]  Apply topically at bedtime. Apply over nail and surrounding skin. Apply daily over previous coat. After seven (7) days, may remove with alcohol and continue cycle.  Patient taking differently: Apply topically at bedtime. Apply over nail and surrounding skin. Apply daily over previous coat. After seven (7) days, may remove with alcohol and continue cycle. As needed   Tobie Franky SQUIBB, DPM  Active   Continuous Glucose Receiver WILMOT G7 [place]) DEVI [id number]  To check for blood sugar Purcell Emil Schanz, MD  Active   Continuous Glucose Sensor (DEXCOM G7 [place]) [place] [id number]  Pt to use to check sugar level every Purcell Emil Schanz, MD  Active   dapagliflozin  propanediol (FARXIGA ) 10 MG TABS tablet [id number] Yes Take 1 tablet (10 mg total) by mouth daily before breakfast. Purcell Emil Schanz, MD  Active   diclofenac  Sodium (VOLTAREN ) 1 % GEL [id number]  Apply 4 g topically 4 (four) times daily. To affected joint. Corey, Evan S, MD   Active   diclofenac  Sodium (VOLTAREN ) 1 % topical gel 4 g [id number]   Joane Artist RAMAN, MD  Active   ezetimibe  (ZETIA ) 10 MG tablet [id number]  Take 1 tablet (10 mg total) by mouth daily.  Patient not taking: Reported on 02/01/2025   Sagardia, Miguel Jose, MD  Active   fluticasone  (FLONASE ) 50 MCG/ACT nasal spray [id number]  Place 1 spray into both nostrils in the morning and at bedtime.  Patient taking differently: Place 1 spray into both nostrils as needed.   Kommor, Madison, MD  Active   hydrOXYzine  (VISTARIL ) 25 MG capsule [id number]  TAKE 1 CAPSULE (25 MG TOTAL) BY MOUTH EVERY 8 (EIGHT) HOURS AS NEEDED FOR ANXIETY. Jason [place]ita Repine, FNP  Active     Discontinued 02/01/25 0910   pantoprazole  (PROTONIX ) 40 MG tablet [id number]  Take 1 tablet (40 mg total) by mouth daily. Craig Alan SAUNDERS, PA-C  Active   potassium chloride  SA (KLOR-CON  M) 20 MEQ tablet [id number]  Take 1 tablet (20 mEq total) by mouth once for 1 dose. Craig Alan SAUNDERS, PA-C  Expired 01/27/25 2359   tirzepatide  (  MOUNJARO ) 7.5 MG/0.5ML Pen [id number] Yes Inject 7.5 mg into the skin once a week. Sagardia, Miguel Jose, MD  Active   valsartan -hydrochlorothiazide  (DIOVAN -HCT) 160-25 MG tablet [id number] Yes TAKE 1 TABLET BY MOUTH EVERY DAY Sagardia, Emil Schanz, MD  Active               Assessment/Plan:   Diabetes: - Currently controlled; goal A1c <7%. Cardiorenal risk reduction has opportunities for improvement.. Blood pressure is not at goal <130/80. LDL is not at goal.  - Discussed dietary and lifestyle modifications - increased exercise and reduced carbs - Will increase Mounjaro  to 10 mg weekly as pt is wanting to also use for further weight loss - Discussed side effects of gastrointestinal upset/nausea; eating smaller meals, avoiding high-fat foods, and remaining upright after eating may reduce nausea. Discussed that overeating is a major trigger of nausea with this class of medications, as often times patients will start to  feel full sooner and may need to decrease portion sizes from what they were previously accustomed to.   - Okay to discontinue Farxiga  due to well controlled diabetes, normal renal function, and MACR WNL - Needs updated MACR  Hypertension: - Currently uncontrolled, BP goal <130/80. Was above at at recent OV however previously was regularly below goal.  - Recommended to check home blood pressure and heart rate using arm cuff. - Recommend to continue valsartan /hydrochlorothiazide . Will assess home BP readings. May be able to discontinue hydrochlorothiazide  component of current combo pill if BP is well below goal.    Hyperlipidemia/ASCVD Risk Reduction: - Currently uncontrolled. LDL goal <70. - Reviewed long term complications of uncontrolled cholesterol along with pt's current risk of ASCVD per risk calculator. - Recommend to trial low dose statin three times weekly. Start atorvastatin  10 mg three times per week. - Stay off of ezetimibe  for now while trialing statin - Will follow up on tolerability.    Follow Up Plan: 2/16  Darrelyn Drum, PharmD, BCPS, CPP Clinical Pharmacist Practitioner [facility] [phone]     "

## 2025-02-02 ENCOUNTER — Other Ambulatory Visit: Payer: Self-pay

## 2025-02-02 DIAGNOSIS — E1169 Type 2 diabetes mellitus with other specified complication: Secondary | ICD-10-CM

## 2025-02-02 NOTE — Telephone Encounter (Signed)
I am not sure I understand the question

## 2025-02-02 NOTE — Telephone Encounter (Signed)
 Please advise.

## 2025-02-02 NOTE — Progress Notes (Unsigned)
 SABRA

## 2025-02-02 NOTE — Telephone Encounter (Signed)
 This has been sent in

## 2025-02-03 ENCOUNTER — Encounter: Payer: Self-pay | Admitting: Neurology

## 2025-02-03 ENCOUNTER — Ambulatory Visit: Admitting: Neurology

## 2025-02-03 ENCOUNTER — Other Ambulatory Visit: Payer: Self-pay | Admitting: Emergency Medicine

## 2025-02-03 VITALS — BP 125/67 | HR 78 | Ht 65.0 in | Wt 250.0 lb

## 2025-02-03 DIAGNOSIS — E1159 Type 2 diabetes mellitus with other circulatory complications: Secondary | ICD-10-CM

## 2025-02-03 DIAGNOSIS — G4733 Obstructive sleep apnea (adult) (pediatric): Secondary | ICD-10-CM | POA: Diagnosis not present

## 2025-02-03 NOTE — Progress Notes (Signed)
 Subjective:    Patient ID: Amanda Mcintosh is a 54 y.o. female.  HPI    Interim history:   Ms. Amanda Mcintosh is a 54 year old right-handed woman with an underlying medical history of reflux disease, vitamin D  deficiency, prior smoking, headaches, dizziness, seasonal allergies, chronic sinusitis, hypertension, and morbid obesity with a BMI of over 40, who presents for follow-up consultation of her obstructive sleep apnea after interim home sleep testing and starting treatment with a new AutoPap machine.  The patient is unaccompanied today.  She was last seen in our clinic by Greig Forbes, NP in September 2025, at which time she was compliant with her AutoPap therapy and eligible for a new machine.  She had a home sleep test through our office on 10/26/2024 which showed severe obstructive sleep apnea with a total AHI of 31.1/hour and O2 nadir of 71.7% with significant time below or at 88% saturation of over 15 minutes for the study, indicating nocturnal hypoxemia. Snoring was detected, in the mild to moderate range.   Today, 02/03/2025: I reviewed her AutoPap compliance data from 01/04/2025 through 02/02/2025, which is a total of 30 days, during which time she used her machine 29 days with percent use days greater than 4 hours at 97%, indicating excellent compliance with an average usage for days on treatment of 8 hours and 34 minutes, residual AHI at goal at 0.2/h, 95th percentile of pressure at 9.5 cm with a range of 7 to 14 cm with EPR of 3.  Leak acceptable with fluctuation, 95th percentile at 17.9 L/min.  She reports overall doing well with her new machine but the water chamber is smaller compared to her previous machine and sometimes she wakes up to an empty water chamber which is annoying.  She has a ResMed AirSense 11 AutoSet machine.  Her set up date on this machine is 12/22/2024. Her DME company is [employer]. She is still benefiting from treatment and is motivated to continue with it.  She is  working on weight loss.  She is on Mounjaro .  She has recently stopped Farxiga  and is hopefully going to stop hydrochlorothiazide  which also may help her mouth dryness get a little better.  She tries to hydrate well with water during the day.   The patient's allergies, current medications, family history, past medical history, past social history, past surgical history and problem list were reviewed and updated as appropriate.    Previously (copied from previous notes for reference):     09/22/2024 ([name] Lomax, NP): <<[name] returns for follow up for OSA on CPAP. She is doing well on therapy. She is using CPAP nightly for about 8.5 hours, on average. She does note benefit of using therapy. She is working on [employer]. She recently started Mounjaro  for DMT2. She has lost about 15lbs.   She is eligible for a new machine.  >>  09/16/2023 (ALL):  <<She returns for follow up for OSA on CPAP. She continues to do well. She is using CPAP most every day for about 7-8 hours. She denies concerns with machine or supplies. She recently started a new job and back to work full time. She is walking a mile, every day. >>  09/13/2022 (ALL): <<She returns for follow up for OSA on CPAP. She continues to do well. She has adjusted to therapy and using every night. She denies concerns with machine or supplies. She was laid off from her job of 15 years. She is planning to look for a new  role next year. Husband has prostate cancer. >>  06/20/2022 (SA): (She) presents for evaluation of a new problem of recurrent headaches.  The patient is referred by her nurse practitioner, Waddell Mon, and I reviewed the office visit note from 04/09/2022.  The patient has been followed in this clinic for her sleep apnea for the past 3 years, she recently missed an appointment in March 2023.  She reports recurrent headaches for the past 2 or 3 months.  She has a dull, achy, tightness like headache in the frontal areas bilaterally,  some nausea occasionally, typically no vomiting.  She has had intermittent blurry vision.  She had a recent eye examination about 2 to 3 weeks ago and sees dry eyes.  She received new prescription reading glasses but when she tried them on she cannot read with them.  She has an appointment to go back today to discuss further.  She does report having had a visual field test and eye background check and was not told that there was any problem.  She is advised to asked them for records to be sent to us .     She has been compliant with her AutoPap but admits that she did not take it on a trip recently.  Unfortunately, she had several deaths in the family and needed to abruptly go to [place] .  In fact, she is traveling back to [place]  and will stay there until the end of July.  She is planning to take her AutoPap machine.  I reviewed her compliance data from 05/21/2022 through 06/19/2022, which is a total of 30 days, during which time she used her machine 24 days with percent use days greater than 4 hours at 80%, indicating good compliance, average usage of 6 hours and 44 minutes, residual AHI 0.4/h, at goal, pressure for the 95th percentile at 13.7 cm high side with the 95th percentile at 34.3 L/min, pressure range of 7 to 14 cm with EPR.   She is trying to work on weight loss.  She does not drink any caffeine on a daily basis, no alcohol, quit smoking.  She in fact has an appointment with bariatric surgery coming up in the next couple of months and also an appointment with medical weight management in the near future, in the next month or so.  She admits that she has not taken her blood pressure medication in the past 3 weeks because she felt dizzy and lightheaded on her blood pressure medication, lately, she has had intermittent chest pressure and anxiety.  Her blood pressure is elevated today.  She quit taking her valsartan -hydrochlorothiazide .  She was on a different medication in the past that she seemed to  tolerate better.  She has also stopped taking her metformin  which was prescribed for weight loss or prediabetes, not for diabetes.  She finished antibiotics for her sinus infection.  She tracks her blood pressure at home on an app and it has been in the 130s to 150s over 80s to 90s.  She does report stress, particularly at work.   The patient has a history of recent upper respiratory infection and persistent cough, she presented to the emergency room on 03/15/22 with a cough and symptoms of acute respiratory infection.  She presented to the emergency room again on 04/03/2022 with headaches and dizziness and persistent coughing.  She had a head CT without contrast on 04/03/2022 and I reviewed the results: IMPRESSION: No evidence of acute intracranial abnormality.   Partially empty  sella turcica. While this finding often reflects incidental anatomic variation, it can also be associated with idiopathic intracranial hypertension (pseudotumor cerebri).   Paranasal sinus disease, as described.   The patient has a longstanding history of dizziness.   She recently saw ENT through [facility] on 04/10/2022 and I reviewed the visit note.  She was on for sinusitis.  Excessive cerumen was removed from both ear canals at the time of her visit with Velia Pry, PA.  Advised to start clindamycin for 10 days for chronic pansinusitis.  Her nocturnal cough was deemed secondary to postnasal drip versus reflux.  She was advised to take her PPI daily.   She had a head CT without contrast and CT angiogram of the head in the remote past on 05/29/2001 with indication of severe headaches.  Family history of intracranial aneurysm.  I reviewed the results: IMPRESSION  MILD INFLAMMATORY LEFT ETHMOID SINUS DISEASE. NO ACUTE INTRACRANIAL FINDINGS.  CT ANGIOGRAPHY OF THE HEAD:   IMPRESSION  NO EVIDENCE FOR INTRACRANIAL ANEURYSM.   She had a brain MRI with and without contrast on 02/18/2013 with indication  of posterior headache, disequilibrium, vagal symptoms, query multiple sclerosis.  I reviewed the results: Impression: Normal MRI appearance of the brain.   She had a HST on 03/18/19 which demonstrated home sleep test demonstrates severe obstructive sleep apnea with a total AHI of 39.8/hour and O2 nadir of 81%.     Addendum, 07/09/2022: I received patient's visit notes from [facility], patient was seen on 05/16/2022 by Dr. Aureliano Lucky.  Posterior segment examination showed benign findings, vitreous optic nerve appearance healthy bilaterally, nerve fiber layer healthy bilaterally, macular clear bilaterally, vessels normal bilaterally, assessment:  hyperopia, astigmatism, presbyopia.     03/28/21 (ALL): <<She returns for follow up for OSA on CPAP. She has continued fairly consistent use. She does not take it with her if she goes out of town. She spent 2 weeks with her daughter and did not take it with her. She feels that she sleeps better without CPAP but recognizes health benefits of using it. She has less headaches and does not wake feeling congested. She has recently started smoking. She has moved into a rental home that she doesn't love and her mother passed away. She is under more stress. She has a plan in place to stop smoking and increase exercise. She would like to lose weight.>>   She saw Greig Forbes, NP on 09/28/20, at which time she was adequate with her autoPAP compliance.      She saw Greig Forbes, NP on 06/27/20, at which time she was not compliant with her autoPAP and encouraged to be consistent with autoPAP.     She had a phone visit with Greig Forbes, NP on 06/24/19, at which time she was compliant with her autoPAP and doing well.    03/03/19 (SA): (She) reports snoring and excessive daytime somnolence as well as witnessed apneas per family's report. I reviewed your office note from 01/28/2019. She had a home sleep test about 4-1/2 years ago and I reviewed the results. Test date was  09/14/2014, overall AHI was 9.2 per hour, O2 nadir was 81%. She weighed 231 pounds at the time, BMI of 38. Her Epworth sleepiness score is 7 out of 24 today, fatigue score is 18 out of 63. She's not sleeping well. She reports a bedtime of 9 PM, some difficulty falling asleep. She does turn her bedroom TV off at 9 PM. She  has a rise time of 6 AM. No night to night nocturia, occasional AM HAs, does have nasal congestion. Mom had OSA.  She had T/A as a child, no asthma. See Dr. Arlana for sinus d/s.  She lives with her husband, she has 1 child. She works for [employer]. She smokes one pack per day, does not utilize alcohol typically and does not drink caffeine on a regular basis.  She is fasting from her church, has an appointment for FU in wt management soon. She has seen an endocrinologist. She has seen an integrative health provider. She has had stress, lost her mom 2 years ago. Recently lost a friend.  Her Past Medical History Is Significant For: Past Medical History:  Diagnosis Date   Anxiety    Arthritis    Diabetes mellitus without complication (HCC)    Fibroids    GERD (gastroesophageal reflux disease)    Heartburn    History of headache    Hyperlipidemia    Hypertension    Obesity    Sleep apnea    Thyroid  condition    Vitamin D  deficiency     Her Past Surgical History Is Significant For: Past Surgical History:  Procedure Laterality Date   CHOLECYSTECTOMY  12/31/2004   Pulmonary function tests  09/14/2022   Normal lung volumes and DLCO.  Normal spirometry.   ROBOT ASSISTED MYOMECTOMY  12/31/2009   Stress Echocardiogram  08/2012   Exercise to max HR 164 bpm - 93% MPHR 177 bpm.  Normal BP and HR response.  No electrical or echocardiographic evidence of exercise-induced ischemia.  Normal EF, baseline > 55% with no RWMA.  Peak stress LVEF increased to 65-70% with no RWMA.  LOW RISK.   TONSILLECTOMY     TRANSTHORACIC ECHOCARDIOGRAM  08/15/2022   NORMAL.  EF 55 to 60%.  No RWMA.  Normal  diastolic parameters.  Unable to assess PAP and otherwise normal RV.  Normal AOV and MV.  Mildly elevated RAP.   WISDOM TOOTH EXTRACTION      Her Family History Is Significant For: Family History  Problem Relation Age of Onset   Diabetes Mother    Hypertension Mother    Heart failure Mother    Stroke Mother    High Cholesterol Mother    Depression Mother    Anxiety disorder Mother    Sleep apnea Mother    Obesity Mother    Obstructive Sleep Apnea Mother    Coronary artery disease Mother 45       s/p PCI   Diabetes Mellitus II Mother    Heart attack Mother 72   COPD Father        smoker   High blood pressure Father    High Cholesterol Father    Breast cancer Sister    Skin cancer Sister    HIV/AIDS Brother    Heart disease Brother 72       enlarged heart = presented with Acute Resp Failure   Hyperlipidemia Other    Migraines Neg Hx    Esophageal cancer Neg Hx    Stomach cancer Neg Hx    Rectal cancer Neg Hx     Her Social History Is Significant For: Social History   Socioeconomic History   Marital status: Legally Separated    Spouse name: Darryl   Number of children: 1   Years of education: Not on file   Highest education level: Some college, no degree  Occupational History   Occupation: Data Processing Manager  Employer: [employer]  Tobacco Use   Smoking status: Former    Current packs/day: 0.00    Average packs/day: 0.3 packs/day    Types: Cigarettes    Quit date: 02/27/2021    Years since quitting: 3.9    Passive exposure: Past   Smokeless tobacco: Never   Tobacco comments:    Quit in Feb 27 2022. Tay 07/26/22  Vaping Use   Vaping status: Never Used  Substance and Sexual Activity   Alcohol use: No   Drug use: No   Sexual activity: Not on file  Other Topics Concern   Not on file  Social History Narrative   No caffeine daily - green tea 1-2 times a month    Social Drivers of Health   Tobacco Use: Medium Risk (02/03/2025)   Patient History     Smoking Tobacco Use: Former    Smokeless Tobacco Use: Never    Passive Exposure: Past  Physicist, Medical Strain: Low Risk (10/27/2024)   Overall Financial Resource Strain (CARDIA)    Difficulty of Paying Living Expenses: Not very hard  Food Insecurity: No Food Insecurity (10/27/2024)   Epic    Worried About Radiation Protection Practitioner of Food in the Last Year: Never true    Ran Out of Food in the Last Year: Never true  Transportation Needs: No Transportation Needs (10/27/2024)   Epic    Lack of Transportation (Medical): No    Lack of Transportation (Non-Medical): No  Physical Activity: Insufficiently Active (10/27/2024)   Exercise Vital Sign    Days of Exercise per Week: 4 days    Minutes of Exercise per Session: 20 min  Stress: Stress Concern Present (10/27/2024)   [employer] of Occupational Health - Occupational Stress Questionnaire    Feeling of Stress: To some extent  Social Connections: Moderately Integrated (10/27/2024)   Social Connection and Isolation Panel    Frequency of Communication with Friends and Family: Twice a week    Frequency of Social Gatherings with Friends and Family: Once a week    Attends Religious Services: More than 4 times per year    Active Member of [employer] or Organizations: Yes    Attends Banker Meetings: More than 4 times per year    Marital Status: Separated  Depression (PHQ2-9): Low Risk (01/27/2025)   Depression (PHQ2-9)    PHQ-2 Score: 0  Alcohol Screen: Not on file  Housing: Low Risk (10/27/2024)   Epic    Unable to Pay for Housing in the Last Year: No    Number of Times Moved in the Last Year: 0    Homeless in the Last Year: No  Utilities: Not At Risk (04/30/2023)   Received from [facility] Utilities    Threatened with loss of utilities: No  Health Literacy: Not on file    Her Allergies Are:  Allergies[1]:   Her Current Medications Are:  Outpatient Encounter Medications as of 02/03/2025  Medication Sig   ACCU-CHEK GUIDE  TEST test strip USE AS DIRECTED IN THE MORNING, AT NOON, AND AT BEDTIME.   albuterol  (VENTOLIN  HFA) 108 (90 Base) MCG/ACT inhaler Inhale 2 puffs into the lungs every 6 (six) hours as needed for wheezing or shortness of breath.   AMBULATORY NON FORMULARY MEDICATION Compression knee sleeve for pre-patellar bursitis Dispense 1 pre-patellar bursitis Use as needed   atorvastatin  (LIPITOR) 10 MG tablet Take 1 tablet (10 mg total) by mouth 3 (three) times a week. On Monday, Wednesday, Friday  Blood Glucose Monitoring Suppl DEVI 1 each by Does not apply route in the morning, at noon, and at bedtime. May substitute to any manufacturer covered by patient's insurance.   Blood Pressure Monitoring (OMRON 3 SERIES BP MONITOR) DEVI Use to check blood pressure daily.   ciclopirox  (PENLAC ) 8 % solution Apply topically at bedtime. Apply over nail and surrounding skin. Apply daily over previous coat. After seven (7) days, may remove with alcohol and continue cycle. (Patient taking differently: Apply topically at bedtime. Apply over nail and surrounding skin. Apply daily over previous coat. After seven (7) days, may remove with alcohol and continue cycle. As needed)   Continuous Glucose Receiver (DEXCOM G7 RECEIVER) DEVI To check for blood sugar   Continuous Glucose Sensor (DEXCOM G7 SENSOR) MISC Pt to use to check sugar level every   diclofenac  Sodium (VOLTAREN ) 1 % GEL Apply 4 g topically 4 (four) times daily. To affected joint.   fluticasone  (FLONASE ) 50 MCG/ACT nasal spray Place 1 spray into both nostrils in the morning and at bedtime. (Patient taking differently: Place 1 spray into both nostrils as needed.)   hydrOXYzine  (VISTARIL ) 25 MG capsule TAKE 1 CAPSULE (25 MG TOTAL) BY MOUTH EVERY 8 (EIGHT) HOURS AS NEEDED FOR ANXIETY.   pantoprazole  (PROTONIX ) 40 MG tablet Take 1 tablet (40 mg total) by mouth daily.   potassium chloride  SA (KLOR-CON  M) 20 MEQ tablet Take 1 tablet (20 mEq total) by mouth once for 1 dose.    tirzepatide  (MOUNJARO ) 10 MG/0.5ML Pen Inject 10 mg into the skin once a week.   valsartan -hydrochlorothiazide  (DIOVAN -HCT) 160-25 MG tablet TAKE 1 TABLET BY MOUTH EVERY DAY   Vitamin D , Ergocalciferol , (DRISDOL ) 1.25 MG (50000 UNIT) CAPS capsule Take 1 capsule (50,000 Units total) by mouth every 7 (seven) days.   Vitamin D , Ergocalciferol , (DRISDOL ) 1.25 MG (50000 UNIT) CAPS capsule TAKE 1 CAPSULE (50,000 UNITS TOTAL) BY MOUTH EVERY 7 (SEVEN) DAYS FOR 12 DOSES.   [DISCONTINUED] valsartan -hydrochlorothiazide  (DIOVAN -HCT) 160-25 MG tablet TAKE 1 TABLET BY MOUTH EVERY DAY   Facility-Administered Encounter Medications as of 02/03/2025  Medication   diclofenac  Sodium (VOLTAREN ) 1 % topical gel 4 g  :  Review of Systems:  Out of a complete 14 point review of systems, all are reviewed and negative with the exception of these symptoms as listed below:   Review of Systems  Objective:  Neurological Exam  Physical Exam Physical Examination:   Vitals:   02/03/25 1248  BP: 125/67  Pulse: 78    General Examination: The patient is a very pleasant 54 y.o. female in no acute distress. She appears well-developed and well-nourished and well groomed.   HEENT: Normocephalic, atraumatic, pupils are equal, round and reactive to light, tracking well-preserved, hearing grossly intact.  Face is symmetric with normal facial animation, speech without dysarthria, hypophonia or voice tremor.  Neck with full range of motion, no carotid bruits.  Airway examination reveals overall stable findings, mild mouth dryness noted. Tonsils absent. Mallampati is class II.  Tongue protrudes centrally and palate elevates symmetrically.  Chest: Clear to auscultation without wheezing, rhonchi or crackles noted.   Heart: S1+S2+0, regular and normal without murmurs, rubs or gallops noted.    Abdomen: Soft, non-distended.   Extremities: There is no pitting edema in the distal lower extremities bilaterally.    Skin: Warm and  dry without trophic changes noted.   Musculoskeletal: exam reveals no obvious joint deformities.    Neurologically:  Mental status: The patient is awake, alert and  oriented in all 4 spheres. Her immediate and remote memory, attention, language skills and fund of knowledge are appropriate. There is no evidence of aphasia, agnosia, apraxia or anomia. Speech is clear with normal prosody and enunciation. Thought process is linear. Mood is normal and affect is normal.  Cranial nerves II - XII are as described above under HEENT exam.  Motor exam: Normal bulk, moving all 4 extremities without any obvious restriction, no obvious resting or action tremor.   Fine motor skills and coordination: grossly intact.  Cerebellar testing: No dysmetria or intention tremor.  Sensory exam: intact to light touch.  Gait, station and balance: She stands easily. No veering to one side is noted. No leaning to one side is noted. Posture is age-appropriate and stance is narrow based. Gait shows normal stride length and normal pace. No problems turning are noted.   Assessment and Plan:  In summary, BRYNNLY BONET is a 54 year old right-handed woman with an underlying medical history of reflux disease, vitamin D  deficiency, prior smoking, headaches, dizziness, seasonal allergies, chronic sinusitis, hypertension, and morbid obesity with a BMI of over 40, who presents for follow-up consultation of her obstructive sleep apnea after interim home sleep testing and starting treatment with a new AutoPap machine.  She has been compliant with AutoPap therapy for years.  She is currently compliant with her new machine and continues to benefit from it and is motivated to continue with treatment.  Her home sleep test through our office on 10/26/2024 which showed severe obstructive sleep apnea with a total AHI of 31.1/hour and O2 nadir of 71.7% with significant time below or at 88% saturation of over 15 minutes for the night.  Her weight  has fluctuated.  She is actively working on weight loss.  Her average pressure has fluctuated as well.  She is commended for her treatment adherence.  She is advised to follow-up routinely in our clinic to see one of our nurse practitioners in 1 year.  We can offer her a video visit appointment if she prefers.  I answered all her questions today and she was in agreement with our plan.  I spent 30 minutes in total face-to-face time and in reviewing records during pre-charting, more than 50% of which was spent in counseling and coordination of care, reviewing test results, reviewing medications and treatment regimen and/or in discussing or reviewing the diagnosis of OSA, the prognosis and treatment options. Pertinent laboratory and imaging test results that were available during this visit with the patient were reviewed by me and considered in my medical decision making (see chart for details).      [1]  Allergies Allergen Reactions   Crestor  [Rosuvastatin  Calcium ] Other (See Comments)    Severe myalgia   Gadolinium Derivatives Other (See Comments)    PT STARTED SHAKING AND FELT VERY COLD INTERNALLY BUT FACE WAS VERY FLUSHED. THIS WAS A DELAYED REACTION (ABOUT 1 HOUR AFTER INJECTION). SHE TOOK BENADRYL  AND HYDRATED AND SYMPTOMS SUBSIDED

## 2025-02-03 NOTE — Patient Instructions (Addendum)
 It was a pleasure speaking with you today!  - Increase Mounjaro  to 10 mg weekly - Discontinue Farxiga  - Start atorvastatin  10 mg three times per week on Monday, Wednesday, Friday. Monitor for muscle pain. - Start checking blood pressure at home regularly. Continue valsartan /hydrochlorothiazide  daily for now.  Feel free to call with any questions or concerns!  Darrelyn Drum, PharmD, BCPS, CPP Clinical Pharmacist Practitioner [facility] [phone]

## 2025-02-03 NOTE — Telephone Encounter (Signed)
 This was completed 2/2, no action needed.  Darrelyn Drum, PharmD, BCPS, CPP Clinical Pharmacist Practitioner [place] [facility] [phone]

## 2025-02-04 NOTE — Telephone Encounter (Signed)
 Okay to fill out form requested.  Her conditions definitely benefit from regular exercise.

## 2025-02-04 NOTE — Telephone Encounter (Signed)
 Ok to do this for the patient since now we have a better understanding?

## 2025-02-08 ENCOUNTER — Ambulatory Visit: Admitting: Physical Therapy

## 2025-02-15 ENCOUNTER — Other Ambulatory Visit

## 2025-02-15 ENCOUNTER — Ambulatory Visit: Admitting: Physical Therapy

## 2025-02-22 ENCOUNTER — Ambulatory Visit: Admitting: Physical Therapy

## 2025-03-01 ENCOUNTER — Encounter: Admitting: Emergency Medicine

## 2025-04-29 ENCOUNTER — Ambulatory Visit: Admitting: Pulmonary Disease

## 2025-07-28 ENCOUNTER — Encounter: Admitting: Emergency Medicine

## 2026-02-07 ENCOUNTER — Ambulatory Visit: Admitting: Family Medicine
